# Patient Record
Sex: Female | Born: 1955 | Race: White | Hispanic: No
Health system: Southern US, Academic
[De-identification: ages and names within clinical notes are randomized; demographics above are authoritative.]

## PROBLEM LIST (undated history)

## (undated) ENCOUNTER — Encounter

## (undated) ENCOUNTER — Encounter: Attending: Oncology | Primary: Oncology

## (undated) ENCOUNTER — Ambulatory Visit

## (undated) ENCOUNTER — Encounter: Attending: Hematology & Oncology | Primary: Hematology & Oncology

## (undated) ENCOUNTER — Telehealth: Attending: Hematology & Oncology | Primary: Hematology & Oncology

## (undated) ENCOUNTER — Other Ambulatory Visit

## (undated) ENCOUNTER — Encounter: Attending: Pharmacist | Primary: Pharmacist

## (undated) ENCOUNTER — Telehealth

## (undated) ENCOUNTER — Ambulatory Visit: Payer: MEDICARE

## (undated) ENCOUNTER — Ambulatory Visit: Payer: Medicare (Managed Care)

## (undated) ENCOUNTER — Ambulatory Visit: Payer: MEDICARE | Attending: Oncology | Primary: Oncology

## (undated) ENCOUNTER — Telehealth: Attending: Adult Health | Primary: Adult Health

## (undated) ENCOUNTER — Encounter: Attending: Clinical | Primary: Clinical

## (undated) ENCOUNTER — Ambulatory Visit: Attending: Clinical | Primary: Clinical

## (undated) ENCOUNTER — Telehealth: Attending: Pharmacist | Primary: Pharmacist

## (undated) ENCOUNTER — Ambulatory Visit: Payer: MEDICARE | Attending: Pharmacist | Primary: Pharmacist

## (undated) ENCOUNTER — Ambulatory Visit: Payer: MEDICARE | Attending: Hematology & Oncology | Primary: Hematology & Oncology

## (undated) ENCOUNTER — Ambulatory Visit: Payer: MEDICARE | Attending: Adult Health | Primary: Adult Health

## (undated) ENCOUNTER — Encounter: Attending: Diagnostic Radiology | Primary: Diagnostic Radiology

## (undated) ENCOUNTER — Other Ambulatory Visit: Attending: Clinical | Primary: Clinical

## (undated) ENCOUNTER — Encounter
Attending: Rehabilitative and Restorative Service Providers" | Primary: Rehabilitative and Restorative Service Providers"

## (undated) ENCOUNTER — Ambulatory Visit: Payer: Medicare (Managed Care) | Attending: Hematology & Oncology | Primary: Hematology & Oncology

## (undated) DIAGNOSIS — I341 Nonrheumatic mitral (valve) prolapse: Secondary | ICD-10-CM

## (undated) DIAGNOSIS — Z923 Personal history of irradiation: Secondary | ICD-10-CM

## (undated) DIAGNOSIS — D473 Essential (hemorrhagic) thrombocythemia: Secondary | ICD-10-CM

## (undated) DIAGNOSIS — M3500 Sicca syndrome, unspecified: Secondary | ICD-10-CM

## (undated) DIAGNOSIS — E785 Hyperlipidemia, unspecified: Secondary | ICD-10-CM

## (undated) DIAGNOSIS — K219 Gastro-esophageal reflux disease without esophagitis: Secondary | ICD-10-CM

## (undated) DIAGNOSIS — E559 Vitamin D deficiency, unspecified: Secondary | ICD-10-CM

## (undated) DIAGNOSIS — F329 Major depressive disorder, single episode, unspecified: Secondary | ICD-10-CM

## (undated) DIAGNOSIS — F419 Anxiety disorder, unspecified: Secondary | ICD-10-CM

## (undated) DIAGNOSIS — D649 Anemia, unspecified: Secondary | ICD-10-CM

## (undated) DIAGNOSIS — R011 Cardiac murmur, unspecified: Secondary | ICD-10-CM

## (undated) DIAGNOSIS — M1990 Unspecified osteoarthritis, unspecified site: Secondary | ICD-10-CM

## (undated) DIAGNOSIS — M810 Age-related osteoporosis without current pathological fracture: Secondary | ICD-10-CM

## (undated) DIAGNOSIS — E0590 Thyrotoxicosis, unspecified without thyrotoxic crisis or storm: Secondary | ICD-10-CM

## (undated) DIAGNOSIS — F32A Depression, unspecified: Secondary | ICD-10-CM

## (undated) DIAGNOSIS — R0600 Dyspnea, unspecified: Secondary | ICD-10-CM

## (undated) DIAGNOSIS — C50919 Malignant neoplasm of unspecified site of unspecified female breast: Secondary | ICD-10-CM

## (undated) DIAGNOSIS — I499 Cardiac arrhythmia, unspecified: Secondary | ICD-10-CM

## (undated) DIAGNOSIS — Z9221 Personal history of antineoplastic chemotherapy: Secondary | ICD-10-CM

## (undated) DIAGNOSIS — Z8719 Personal history of other diseases of the digestive system: Secondary | ICD-10-CM

## (undated) HISTORY — DX: Essential (hemorrhagic) thrombocythemia: D47.3

## (undated) HISTORY — PX: TONSILLECTOMY: SUR1361

## (undated) MED ORDER — OMEPRAZOLE 20 MG CAPSULE,DELAYED RELEASE: capsule | 0 refills | 0 days

## (undated) MED ORDER — DOCUSATE SODIUM 100 MG CAPSULE: Freq: Every day | ORAL | 0 days

## (undated) MED ORDER — RUXOLITINIB 10 MG TABLET: Freq: Two times a day (BID) | ORAL | 0.00000 days

## (undated) MED ORDER — OMEGA-3 FATTY ACIDS-FISH OIL 300 MG-1,000 MG CAPSULE: Freq: Every day | ORAL | 0 days

---

## 1993-02-09 DIAGNOSIS — Z923 Personal history of irradiation: Secondary | ICD-10-CM

## 1993-02-09 DIAGNOSIS — Z9221 Personal history of antineoplastic chemotherapy: Secondary | ICD-10-CM

## 1993-02-09 DIAGNOSIS — C50919 Malignant neoplasm of unspecified site of unspecified female breast: Secondary | ICD-10-CM

## 1993-02-09 HISTORY — DX: Personal history of antineoplastic chemotherapy: Z92.21

## 1993-02-09 HISTORY — DX: Personal history of irradiation: Z92.3

## 1993-02-09 HISTORY — PX: BREAST LUMPECTOMY: SHX2

## 1993-02-09 HISTORY — PX: MASTECTOMY: SHX3

## 1993-02-09 HISTORY — PX: AUGMENTATION MAMMAPLASTY: SUR837

## 1993-02-09 HISTORY — DX: Malignant neoplasm of unspecified site of unspecified female breast: C50.919

## 1995-02-10 DIAGNOSIS — I341 Nonrheumatic mitral (valve) prolapse: Secondary | ICD-10-CM

## 1995-02-10 HISTORY — DX: Nonrheumatic mitral (valve) prolapse: I34.1

## 2013-10-23 MED ORDER — LEVOTHYROXINE 25 MCG TABLET
Freq: Every day | ORAL | 0 days
Start: 2013-10-23 — End: ?

## 2014-11-22 DIAGNOSIS — C50512 Malignant neoplasm of lower-outer quadrant of left female breast: Secondary | ICD-10-CM | POA: Insufficient documentation

## 2014-11-22 DIAGNOSIS — R87619 Unspecified abnormal cytological findings in specimens from cervix uteri: Secondary | ICD-10-CM | POA: Insufficient documentation

## 2014-11-22 DIAGNOSIS — M3500 Sicca syndrome, unspecified: Secondary | ICD-10-CM | POA: Insufficient documentation

## 2014-11-22 DIAGNOSIS — K219 Gastro-esophageal reflux disease without esophagitis: Secondary | ICD-10-CM | POA: Insufficient documentation

## 2014-11-22 DIAGNOSIS — E782 Mixed hyperlipidemia: Secondary | ICD-10-CM | POA: Insufficient documentation

## 2014-11-22 DIAGNOSIS — E559 Vitamin D deficiency, unspecified: Secondary | ICD-10-CM | POA: Insufficient documentation

## 2014-11-26 ENCOUNTER — Other Ambulatory Visit: Payer: Self-pay | Admitting: Family Medicine

## 2014-11-26 DIAGNOSIS — E559 Vitamin D deficiency, unspecified: Secondary | ICD-10-CM

## 2014-12-20 ENCOUNTER — Other Ambulatory Visit: Payer: Self-pay | Admitting: Family Medicine

## 2014-12-20 ENCOUNTER — Inpatient Hospital Stay
Admission: RE | Admit: 2014-12-20 | Discharge: 2014-12-20 | Disposition: A | Discharge status: Home / Self Care | Payer: Self-pay | Source: Ambulatory Visit | Attending: *Deleted | Admitting: *Deleted

## 2014-12-20 ENCOUNTER — Other Ambulatory Visit: Payer: Self-pay | Admitting: *Deleted

## 2014-12-20 DIAGNOSIS — Z9289 Personal history of other medical treatment: Secondary | ICD-10-CM

## 2014-12-20 DIAGNOSIS — Z1231 Encounter for screening mammogram for malignant neoplasm of breast: Secondary | ICD-10-CM

## 2014-12-25 ENCOUNTER — Ambulatory Visit
Admission: RE | Admit: 2014-12-25 | Discharge: 2014-12-25 | Disposition: A | Discharge status: Home / Self Care | Payer: No Typology Code available for payment source | Source: Ambulatory Visit | Attending: Family Medicine | Admitting: Family Medicine

## 2014-12-25 ENCOUNTER — Other Ambulatory Visit: Payer: Self-pay | Admitting: Family Medicine

## 2014-12-25 DIAGNOSIS — Z1231 Encounter for screening mammogram for malignant neoplasm of breast: Secondary | ICD-10-CM

## 2014-12-25 DIAGNOSIS — Z9882 Breast implant status: Secondary | ICD-10-CM | POA: Diagnosis not present

## 2014-12-25 HISTORY — DX: Malignant neoplasm of unspecified site of unspecified female breast: C50.919

## 2015-01-08 ENCOUNTER — Ambulatory Visit
Admission: RE | Admit: 2015-01-08 | Discharge: 2015-01-08 | Disposition: A | Discharge status: Home / Self Care | Payer: No Typology Code available for payment source | Source: Ambulatory Visit | Attending: Family Medicine | Admitting: Family Medicine

## 2015-01-08 DIAGNOSIS — M810 Age-related osteoporosis without current pathological fracture: Secondary | ICD-10-CM | POA: Insufficient documentation

## 2015-01-08 DIAGNOSIS — E559 Vitamin D deficiency, unspecified: Secondary | ICD-10-CM | POA: Diagnosis not present

## 2015-01-11 ENCOUNTER — Other Ambulatory Visit: Payer: Self-pay | Admitting: Family Medicine

## 2015-01-11 DIAGNOSIS — R7989 Other specified abnormal findings of blood chemistry: Secondary | ICD-10-CM

## 2015-01-21 ENCOUNTER — Ambulatory Visit: Payer: No Typology Code available for payment source

## 2015-01-22 ENCOUNTER — Ambulatory Visit
Admission: RE | Admit: 2015-01-22 | Discharge: 2015-01-22 | Disposition: A | Discharge status: Home / Self Care | Payer: No Typology Code available for payment source | Source: Ambulatory Visit | Attending: Family Medicine | Admitting: Family Medicine

## 2015-01-22 ENCOUNTER — Ambulatory Visit: Payer: No Typology Code available for payment source

## 2015-01-22 DIAGNOSIS — R7989 Other specified abnormal findings of blood chemistry: Secondary | ICD-10-CM | POA: Diagnosis present

## 2015-06-10 DIAGNOSIS — M810 Age-related osteoporosis without current pathological fracture: Secondary | ICD-10-CM | POA: Insufficient documentation

## 2015-11-25 DIAGNOSIS — E7800 Pure hypercholesterolemia, unspecified: Secondary | ICD-10-CM | POA: Insufficient documentation

## 2015-11-27 ENCOUNTER — Other Ambulatory Visit: Payer: Self-pay | Admitting: Internal Medicine

## 2015-11-27 DIAGNOSIS — Z1231 Encounter for screening mammogram for malignant neoplasm of breast: Secondary | ICD-10-CM

## 2015-12-27 ENCOUNTER — Ambulatory Visit: Payer: No Typology Code available for payment source

## 2016-01-08 ENCOUNTER — Ambulatory Visit
Admission: RE | Admit: 2016-01-08 | Discharge: 2016-01-08 | Disposition: A | Discharge status: Home / Self Care | Payer: BLUE CROSS/BLUE SHIELD | Source: Ambulatory Visit | Attending: Internal Medicine | Admitting: Internal Medicine

## 2016-01-08 ENCOUNTER — Other Ambulatory Visit: Payer: Self-pay | Admitting: Internal Medicine

## 2016-01-08 DIAGNOSIS — Z1231 Encounter for screening mammogram for malignant neoplasm of breast: Secondary | ICD-10-CM | POA: Diagnosis not present

## 2016-01-08 HISTORY — DX: Personal history of antineoplastic chemotherapy: Z92.21

## 2016-01-08 HISTORY — DX: Personal history of irradiation: Z92.3

## 2016-01-17 DIAGNOSIS — D473 Essential (hemorrhagic) thrombocythemia: Secondary | ICD-10-CM | POA: Insufficient documentation

## 2016-04-03 ENCOUNTER — Encounter: Payer: Self-pay | Admitting: *Deleted

## 2016-04-05 DIAGNOSIS — E063 Autoimmune thyroiditis: Secondary | ICD-10-CM | POA: Insufficient documentation

## 2016-04-06 ENCOUNTER — Encounter: Admission: RE | Payer: Self-pay | Source: Ambulatory Visit

## 2016-04-06 ENCOUNTER — Ambulatory Visit
Admission: RE | Admit: 2016-04-06 | Payer: BLUE CROSS/BLUE SHIELD | Source: Ambulatory Visit | Admitting: Unknown Physician Specialty

## 2016-04-06 HISTORY — DX: Major depressive disorder, single episode, unspecified: F32.9

## 2016-04-06 HISTORY — DX: Unspecified osteoarthritis, unspecified site: M19.90

## 2016-04-06 HISTORY — DX: Depression, unspecified: F32.A

## 2016-04-06 HISTORY — DX: Hyperlipidemia, unspecified: E78.5

## 2016-04-06 HISTORY — DX: Gastro-esophageal reflux disease without esophagitis: K21.9

## 2016-04-06 HISTORY — DX: Vitamin D deficiency, unspecified: E55.9

## 2016-04-06 HISTORY — DX: Anemia, unspecified: D64.9

## 2016-04-06 HISTORY — DX: Sjogren syndrome, unspecified: M35.00

## 2016-04-06 SURGERY — COLONOSCOPY WITH PROPOFOL
Anesthesia: General

## 2016-04-17 ENCOUNTER — Inpatient Hospital Stay: Payer: BLUE CROSS/BLUE SHIELD

## 2016-04-17 ENCOUNTER — Inpatient Hospital Stay: Payer: BLUE CROSS/BLUE SHIELD | Attending: Oncology | Admitting: Oncology

## 2016-04-17 ENCOUNTER — Encounter: Payer: Self-pay | Admitting: Oncology

## 2016-04-17 VITALS — BP 123/66 | HR 63 | Temp 96.8°F | Resp 18 | Ht 64.0 in | Wt 123.7 lb

## 2016-04-17 DIAGNOSIS — M818 Other osteoporosis without current pathological fracture: Secondary | ICD-10-CM | POA: Insufficient documentation

## 2016-04-17 DIAGNOSIS — Z853 Personal history of malignant neoplasm of breast: Secondary | ICD-10-CM | POA: Diagnosis not present

## 2016-04-17 DIAGNOSIS — Z79899 Other long term (current) drug therapy: Secondary | ICD-10-CM | POA: Diagnosis not present

## 2016-04-17 DIAGNOSIS — E785 Hyperlipidemia, unspecified: Secondary | ICD-10-CM | POA: Insufficient documentation

## 2016-04-17 DIAGNOSIS — D473 Essential (hemorrhagic) thrombocythemia: Secondary | ICD-10-CM | POA: Diagnosis present

## 2016-04-17 DIAGNOSIS — E063 Autoimmune thyroiditis: Secondary | ICD-10-CM | POA: Diagnosis not present

## 2016-04-17 DIAGNOSIS — E559 Vitamin D deficiency, unspecified: Secondary | ICD-10-CM

## 2016-04-17 DIAGNOSIS — I341 Nonrheumatic mitral (valve) prolapse: Secondary | ICD-10-CM | POA: Diagnosis not present

## 2016-04-17 DIAGNOSIS — Z9481 Bone marrow transplant status: Secondary | ICD-10-CM

## 2016-04-17 DIAGNOSIS — Z9012 Acquired absence of left breast and nipple: Secondary | ICD-10-CM | POA: Insufficient documentation

## 2016-04-17 DIAGNOSIS — D75839 Thrombocytosis, unspecified: Secondary | ICD-10-CM

## 2016-04-17 DIAGNOSIS — M3500 Sicca syndrome, unspecified: Secondary | ICD-10-CM | POA: Insufficient documentation

## 2016-04-17 DIAGNOSIS — D696 Thrombocytopenia, unspecified: Secondary | ICD-10-CM | POA: Insufficient documentation

## 2016-04-17 DIAGNOSIS — F329 Major depressive disorder, single episode, unspecified: Secondary | ICD-10-CM | POA: Diagnosis not present

## 2016-04-17 DIAGNOSIS — Z87891 Personal history of nicotine dependence: Secondary | ICD-10-CM | POA: Insufficient documentation

## 2016-04-17 DIAGNOSIS — K219 Gastro-esophageal reflux disease without esophagitis: Secondary | ICD-10-CM

## 2016-04-17 LAB — DIFFERENTIAL
Basophils Absolute: 0.1 10*3/uL (ref 0–0.1)
Basophils Relative: 1 %
EOS PCT: 2 %
Eosinophils Absolute: 0.2 10*3/uL (ref 0–0.7)
LYMPHS ABS: 2.2 10*3/uL (ref 1.0–3.6)
LYMPHS PCT: 26 %
MONO ABS: 0.6 10*3/uL (ref 0.2–0.9)
Monocytes Relative: 7 %
NEUTROS ABS: 5.4 10*3/uL (ref 1.4–6.5)
Neutrophils Relative %: 64 %

## 2016-04-17 LAB — CBC
HEMATOCRIT: 37.9 % (ref 35.0–47.0)
HEMOGLOBIN: 12.9 g/dL (ref 12.0–16.0)
MCH: 29.5 pg (ref 26.0–34.0)
MCHC: 33.9 g/dL (ref 32.0–36.0)
MCV: 86.9 fL (ref 80.0–100.0)
Platelets: 718 10*3/uL — ABNORMAL HIGH (ref 150–440)
RBC: 4.36 MIL/uL (ref 3.80–5.20)
RDW: 14.1 % (ref 11.5–14.5)
WBC: 8.6 10*3/uL (ref 3.6–11.0)

## 2016-04-17 LAB — IRON AND TIBC
Iron: 68 ug/dL (ref 28–170)
SATURATION RATIOS: 17 % (ref 10.4–31.8)
TIBC: 399 ug/dL (ref 250–450)
UIBC: 331 ug/dL

## 2016-04-17 LAB — C-REACTIVE PROTEIN: CRP: <0.8 mg/dL (ref <1.0)

## 2016-04-17 LAB — SEDIMENTATION RATE: Sed Rate: 9 mm/hr (ref 0–30)

## 2016-04-17 LAB — FERRITIN: Ferritin: 57 ng/mL (ref 11–307)

## 2016-04-17 NOTE — Progress Notes (Signed)
Hematology/Oncology Consult note [facility] Telephone:([phone] Fax:([phone]  Patient Care Team: Clarisse Gouge, MD as PCP - General (Family Medicine)   Name of the patient: Karen [name]  [id number]  1955-04-29    Reason for referral- thrombocytosis   Referring physician- Dr. Vickki Muff  Date of visit: 04/17/16   History of presenting illness- patient is a 61 year old female with a history of Sjogren syndrome and autoimmune thyroid disorder. She also had left breast cancer at 61 years of age and underwent mastectomy as well as chemotherapy and autologus bone marrow transplant in 1994. She has a history of mitral valve prolapse. She has been referred to [place] for evaluation of thrombocytosis. She has a h/o hyperthyroidism for which she was on medication in the past. Recently she has been started on propranolol and is awaiting endocrinology referral.  Most recent CBC from 03/30/2016 showed white count of 7.5, H&H of 12.4/36.8 and a platelet count of 727. MCV was normal at 87.2. CMP was within normal limits except for potassium of 5.9. TSH was less than 0.01. Platelet count in December 2017 was 664 and in October 2017 it was 621. In November 2016 her platelet count was 654 and 738.   Patient currently reports generalized itching. Denies other complaints. No prior h/o thrombosis. Her daughters have been found to be BRCA positive but patient was tested negative.  ECOG PS- 0  Pain scale- 0   Review of systems- Review of Systems  Constitutional: Negative for chills, fever, malaise/fatigue and weight loss.  HENT: Negative for congestion, ear discharge and nosebleeds.   Eyes: Negative for blurred vision.  Respiratory: Negative for cough, hemoptysis, sputum production, shortness of breath and wheezing.   Cardiovascular: Negative for chest pain, palpitations, orthopnea and claudication.  Gastrointestinal: Negative for abdominal pain, blood in stool,  constipation, diarrhea, heartburn, melena, nausea and vomiting.  Genitourinary: Negative for dysuria, flank pain, frequency, hematuria and urgency.  Musculoskeletal: Negative for back pain, joint pain and myalgias.  Skin: Positive for itching. Negative for rash.  Neurological: Negative for dizziness, tingling, focal weakness, seizures, weakness and headaches.  Endo/Heme/Allergies: Does not bruise/bleed easily.  Psychiatric/Behavioral: Negative for depression and suicidal ideas. The patient does not have insomnia.     No Known Allergies  Patient Active Problem List   Diagnosis Date Noted  . Autoimmune thyroiditis 04/05/2016  . Thrombocytosis ([place]) 01/17/2016  . Pure hypercholesterolemia 11/25/2015  . Age-related osteoporosis without current pathological fracture 06/10/2015  . Abnormal Pap smear of cervix 11/22/2014  . Gastroesophageal reflux disease 11/22/2014  . Sjogrens syndrome ([place]) 11/22/2014  . Malignant neoplasm of lower-outer quadrant of left female breast ([place]) 11/22/2014  . Mixed hyperlipidemia 11/22/2014  . Vitamin D deficiency 11/22/2014     Past Medical History:  Diagnosis Date  . Anemia   . Arthritis   . Breast cancer ([place]) 1995   left breast cancer - chemo and modified mastectomy  . Depression   . GERD (gastroesophageal reflux disease)   . Hyperlipidemia   . Personal history of chemotherapy 1995   BREAST CA  . Personal history of radiation therapy 1995   BREAST CA  . Sjogren's syndrome ([place])   . Vitamin D deficiency      Past Surgical History:  Procedure Laterality Date  . AUGMENTATION MAMMAPLASTY Bilateral   . BREAST LUMPECTOMY Left 1995  . MASTECTOMY Left 1995   Tram flap W/IMPLANT    Social History   Social History  . Marital status: Divorced  Spouse name: N/A  . Number of children: N/A  . Years of education: N/A   Occupational History  . Not on file.   Social History Main Topics  . Smoking status: Former Smoker    Packs/day: 0.50     Years: 48.00    Quit date: 02/09/2010  . Smokeless tobacco: Never Used  . Alcohol use Yes     Comment: occ. wine maybe 1  month  . Drug use: No  . Sexual activity: Not on file   Other Topics Concern  . Not on file   Social History Narrative  . No narrative on file     Family History  Problem Relation Age of Onset  . Breast cancer Neg Hx      Current Outpatient Prescriptions:  Marland Kitchen  Multiple Vitamin (MULTIVITAMIN) tablet, Take 1 tablet by mouth daily., Disp: , Rfl:  .  omega-3 acid ethyl esters (LOVAZA) 1 g capsule, Take 1 g by mouth daily., Disp: , Rfl:  .  PARoxetine (PAXIL) 20 MG tablet, Take 20 mg by mouth daily., Disp: , Rfl:  .  pravastatin (PRAVACHOL) 20 MG tablet, Take 20 mg by mouth daily., Disp: , Rfl:  .  Probiotic Product (PROBIOTIC-10) CAPS, Take 1 capsule by mouth daily., Disp: , Rfl:  .  propranolol (INDERAL) 10 MG tablet, Take 10 mg by mouth 3 (three) times daily., Disp: , Rfl:  .  raloxifene (EVISTA) 60 MG tablet, Take 60 mg by mouth daily., Disp: , Rfl:  .  psyllium (METAMUCIL) 58.6 % packet, Take 1 packet by mouth daily as needed. , Disp: , Rfl:    Physical exam:  Vitals:   04/17/16 0938  BP: 123/66  Pulse: 63  Resp: 18  Temp: (!) 96.8 F (36 C)  TempSrc: Tympanic  Weight: 123 lb 10.9 oz (56.1 kg)  Height: 5' 4"  (1.626 m)   Physical Exam  Constitutional: She is oriented to person, place, and time and well-developed, well-nourished, and in no distress.  HENT:  Head: Normocephalic and atraumatic.  Eyes: EOM are normal. Pupils are equal, round, and reactive to light.  Neck: Normal range of motion.  Cardiovascular: Normal rate, regular rhythm and normal heart sounds.   Pulmonary/Chest: Effort normal and breath sounds normal.  Abdominal: Soft. Bowel sounds are normal.  Neurological: She is alert and oriented to person, place, and time.  Skin: Skin is warm and dry.        Assessment and plan- Patient is a 61 y.o. female who has been referred to [place]  for evaluation and management of thrombocytosis  Patient has a long-standing history of thrombocytosis with platelet counts that have remained high between 600-700's but overall stable in that range. I explained to the patient that thrombocytosis can be primary due to an underlying bone marrow disorder versus secondary from acute inflammation or stress as well as conditions like iron deficiency.   Today I will check a CBC with manual differential, and a  pathology review of her smear. Will check ESR, CRP, iron studies and jak 2 mutation with reflex to CALR and MPL as well as BCR abl testing. I will see the patient back in 2 weeks' time to discuss the results of her blood work and further management at that time. If the cause of thrombocytosis remains unclear, I will consider doing a bone marrow biopsy to rule out myeloproliferative disorder.   Thank you for this kind referral and the opportunity to participate in the care of this patient  Visit Diagnosis 1. Thrombocytosis ([place])     Dr. Randa Evens, MD, MPH [facility] at [facility] Pager- [phone] 04/17/2016  10:20 AM

## 2016-04-20 LAB — BCR-ABL1, CML/ALL, PCR, QUANT

## 2016-04-22 LAB — JAK2 V617F, W REFLEX TO CALR/E12/MPL

## 2016-04-23 ENCOUNTER — Other Ambulatory Visit: Payer: Self-pay | Admitting: *Deleted

## 2016-04-23 DIAGNOSIS — D473 Essential (hemorrhagic) thrombocythemia: Secondary | ICD-10-CM

## 2016-04-23 DIAGNOSIS — Z1589 Genetic susceptibility to other disease: Secondary | ICD-10-CM

## 2016-04-23 DIAGNOSIS — D75839 Thrombocytosis, unspecified: Secondary | ICD-10-CM

## 2016-04-24 LAB — JAK2 EXONS 12-15

## 2016-04-27 LAB — CALRETICULIN (CALR) MUTATION ANALYSIS

## 2016-04-27 LAB — MPL MUTATION ANALYSIS

## 2016-04-29 ENCOUNTER — Telehealth: Payer: Self-pay | Admitting: *Deleted

## 2016-04-29 NOTE — Telephone Encounter (Signed)
Asking to reschedule BM Bx from Wednesday to the following Monday. Please call her back

## 2016-04-30 ENCOUNTER — Telehealth: Payer: Self-pay | Admitting: *Deleted

## 2016-04-30 NOTE — Telephone Encounter (Signed)
I originally called pt to tell her 3/28 and she needs to have it on a Monday and I had called specialty sch. And asked for it to be changed and they had a pt. They had a patient that they were trying to move to another day and if so she could have that spot. Later this morning I was called back by Pamala Hurry in sch. And she states they were unable to put her on for 3/26 so it will have to be 4/2 for her to arrive at 8 am and have procedure at 9 am.  I have called pt and left her a message. Told her all the above on the message and then if she needs to call me back she can and I left my #/  I have left her all instructions for the procedure.

## 2016-05-05 NOTE — Telephone Encounter (Signed)
There is a lot of notes about her. I have made the appt for Monday and it will be 4/2. Pt already aware

## 2016-05-06 ENCOUNTER — Ambulatory Visit: Payer: BLUE CROSS/BLUE SHIELD

## 2016-05-08 ENCOUNTER — Inpatient Hospital Stay: Payer: BLUE CROSS/BLUE SHIELD | Admitting: Oncology

## 2016-05-08 ENCOUNTER — Other Ambulatory Visit: Payer: Self-pay | Admitting: Radiology

## 2016-05-11 ENCOUNTER — Ambulatory Visit
Admission: RE | Admit: 2016-05-11 | Discharge: 2016-05-11 | Disposition: A | Discharge status: Home / Self Care | Payer: BLUE CROSS/BLUE SHIELD | Source: Ambulatory Visit | Attending: Oncology | Admitting: Oncology

## 2016-05-11 ENCOUNTER — Other Ambulatory Visit (HOSPITAL_COMMUNITY)
Admission: RE | Admit: 2016-05-11 | Disposition: A | Discharge status: Home / Self Care | Payer: BLUE CROSS/BLUE SHIELD | Source: Other Acute Inpatient Hospital | Attending: Oncology | Admitting: Oncology

## 2016-05-11 DIAGNOSIS — D473 Essential (hemorrhagic) thrombocythemia: Secondary | ICD-10-CM | POA: Insufficient documentation

## 2016-05-11 DIAGNOSIS — R7989 Other specified abnormal findings of blood chemistry: Secondary | ICD-10-CM | POA: Insufficient documentation

## 2016-05-11 DIAGNOSIS — D75839 Thrombocytosis, unspecified: Secondary | ICD-10-CM

## 2016-05-11 DIAGNOSIS — Z1589 Genetic susceptibility to other disease: Secondary | ICD-10-CM | POA: Diagnosis present

## 2016-05-11 HISTORY — DX: Personal history of other diseases of the digestive system: Z87.19

## 2016-05-11 HISTORY — DX: Nonrheumatic mitral (valve) prolapse: I34.1

## 2016-05-11 HISTORY — DX: Thyrotoxicosis, unspecified without thyrotoxic crisis or storm: E05.90

## 2016-05-11 HISTORY — DX: Dyspnea, unspecified: R06.00

## 2016-05-11 LAB — CBC WITH DIFFERENTIAL/PLATELET
Basophils Absolute: 0.1 10*3/uL (ref 0–0.1)
Basophils Relative: 1 %
EOS ABS: 0.1 10*3/uL (ref 0–0.7)
Eosinophils Relative: 2 %
HEMATOCRIT: 36.5 % (ref 35.0–47.0)
HEMOGLOBIN: 12.5 g/dL (ref 12.0–16.0)
LYMPHS ABS: 1.8 10*3/uL (ref 1.0–3.6)
LYMPHS PCT: 23 %
MCH: 29.1 pg (ref 26.0–34.0)
MCHC: 34.3 g/dL (ref 32.0–36.0)
MCV: 84.8 fL (ref 80.0–100.0)
MONOS PCT: 9 %
Monocytes Absolute: 0.7 10*3/uL (ref 0.2–0.9)
NEUTROS PCT: 65 %
Neutro Abs: 5.1 10*3/uL (ref 1.4–6.5)
Platelets: 618 10*3/uL — ABNORMAL HIGH (ref 150–440)
RBC: 4.3 MIL/uL (ref 3.80–5.20)
RDW: 14 % (ref 11.5–14.5)
WBC: 7.9 10*3/uL (ref 3.6–11.0)

## 2016-05-11 LAB — APTT: APTT: 31 s (ref 24–36)

## 2016-05-11 LAB — PROTIME-INR
INR: 1.08
Prothrombin Time: 14 seconds (ref 11.4–15.2)

## 2016-05-11 MED ORDER — HEPARIN SOD (PORK) LOCK FLUSH 100 UNIT/ML IV SOLN
INTRAVENOUS | Status: AC
  Filled 2016-05-11: qty 5

## 2016-05-11 MED ORDER — MIDAZOLAM HCL 5 MG/5ML IJ SOLN
INTRAMUSCULAR | Status: AC | PRN
  Administered 2016-05-11 (×2): 0.5 mg via INTRAVENOUS
  Administered 2016-05-11: 1 mg via INTRAVENOUS

## 2016-05-11 MED ORDER — FENTANYL CITRATE (PF) 100 MCG/2ML IJ SOLN
INTRAMUSCULAR | Status: AC
  Filled 2016-05-11: qty 2

## 2016-05-11 MED ORDER — FENTANYL CITRATE (PF) 100 MCG/2ML IJ SOLN
INTRAMUSCULAR | Status: AC | PRN
  Administered 2016-05-11: 25 ug via INTRAVENOUS
  Administered 2016-05-11: 50 ug via INTRAVENOUS

## 2016-05-11 MED ORDER — MIDAZOLAM HCL 5 MG/5ML IJ SOLN
INTRAMUSCULAR | Status: AC
  Filled 2016-05-11: qty 5

## 2016-05-11 MED ORDER — SODIUM CHLORIDE 0.9 % IV SOLN
INTRAVENOUS | Status: DC
  Administered 2016-05-11: 09:00:00 via INTRAVENOUS

## 2016-05-11 NOTE — H&P (Signed)
Chief Complaint: Patient was seen in consultation today for bone marrow biopsy at the request of Rao,Archana C  Referring Physician(s): Rao,Archana C  Patient Status: [facility] - Out-pt  History of Present Illness: Karen [name] is a 61 y.o. female with thrombocytosis and history of breast carcinoma with autologous BM transplant.  Recent diagnosis of hyperthyroidism.  Anxious about biopsy, but otherwise asymptomatic today.  Past Medical History:  Diagnosis Date  . Anemia   . Arthritis   . Breast cancer ([place]) 1995   left breast cancer - chemo and modified mastectomy  . Depression   . Dyspnea   . GERD (gastroesophageal reflux disease)   . History of hiatal hernia   . Hyperlipidemia   . Hyperthyroidism   . Mitral valve prolapse 1997  . Personal history of chemotherapy 1995   BREAST CA  . Personal history of radiation therapy 1995   BREAST CA  . Sjogren's syndrome ([place])   . Vitamin D deficiency     Past Surgical History:  Procedure Laterality Date  . AUGMENTATION MAMMAPLASTY Bilateral   . BREAST LUMPECTOMY Left 1995  . MASTECTOMY Left 1995   Tram flap W/IMPLANT    Allergies: Patient has no known allergies.  Medications: Prior to Admission medications   Medication Sig Start Date End Date Taking? Authorizing Provider  omega-3 acid ethyl esters (LOVAZA) 1 g capsule Take 1 g by mouth daily.   Yes Historical Provider, MD  PARoxetine (PAXIL) 20 MG tablet Take 20 mg by mouth daily.   Yes Historical Provider, MD  pravastatin (PRAVACHOL) 20 MG tablet Take 20 mg by mouth daily.   Yes Historical Provider, MD  Probiotic Product (PROBIOTIC-10) CAPS Take 1 capsule by mouth daily.   Yes Historical Provider, MD  propranolol (INDERAL) 10 MG tablet Take 10 mg by mouth 3 (three) times daily.   Yes Historical Provider, MD  raloxifene (EVISTA) 60 MG tablet Take 60 mg by mouth daily.   Yes Historical Provider, MD  Multiple Vitamin (MULTIVITAMIN) tablet Take 1 tablet by mouth daily.     Historical Provider, MD  psyllium (METAMUCIL) 58.6 % packet Take 1 packet by mouth daily as needed.     Historical Provider, MD     Family History  Problem Relation Age of Onset  . Breast cancer Neg Hx     Social History   Social History  . Marital status: Divorced    Spouse name: N/A  . Number of children: N/A  . Years of education: N/A   Social History Main Topics  . Smoking status: Former Smoker    Packs/day: 0.50    Years: 48.00    Types: Cigarettes    Quit date: 02/09/2010  . Smokeless tobacco: Never Used  . Alcohol use Yes     Comment: occ. wine maybe 1  month  . Drug use: No  . Sexual activity: Not Asked   Other Topics Concern  . None   Social History Narrative  . None    Review of Systems: A 12 point ROS discussed and pertinent positives are indicated in the HPI above.  All other systems are negative.  Review of Systems  Constitutional: Negative.   HENT: Negative.   Respiratory: Negative.   Cardiovascular: Negative.   Gastrointestinal: Negative.   Genitourinary: Negative.   Musculoskeletal: Negative.   Neurological: Negative.   Hematological: Negative.     Vital Signs: BP 126/62   Pulse 72   Temp 97.7 F (36.5 C) (Oral)   Resp  19   Ht 5' 4"  (1.626 m)   Wt 125 lb (56.7 kg)   SpO2 99%   BMI 21.46 kg/m   Physical Exam  Constitutional: She is oriented to person, place, and time. She appears well-developed. No distress.  HENT:  Head: Normocephalic and atraumatic.  Neck: Neck supple. No JVD present. No tracheal deviation present. No thyromegaly present.  Cardiovascular: Normal rate, regular rhythm and normal heart sounds.  Exam reveals no gallop and no friction rub.   No murmur heard. Pulmonary/Chest: Effort normal and breath sounds normal. No stridor. No respiratory distress. She has no wheezes. She has no rales.  Abdominal: Soft. Bowel sounds are normal. She exhibits no distension and no mass. There is no tenderness. There is no rebound and no  guarding.  Musculoskeletal: She exhibits no edema.  Lymphadenopathy:    She has no cervical adenopathy.  Neurological: She is alert and oriented to person, place, and time.  Skin: She is not diaphoretic.    Mallampati Score:  MD Evaluation Airway: WNL Heart: WNL Abdomen: WNL Chest/ Lungs: WNL ASA  Classification: 2 Mallampati/Airway Score: One  Imaging: No results found.  Labs:  CBC:  Recent Labs  04/17/16 1024 05/11/16 0808  WBC 8.6 7.9  HGB 12.9 12.5  HCT 37.9 36.5  PLT 718* 618*    COAGS: No results for input(s): INR, APTT in the last 8760 hours.  BMP: No results for input(s): NA, K, CL, CO2, GLUCOSE, BUN, CALCIUM, CREATININE, GFRNONAA, GFRAA in the last 8760 hours.  Invalid input(s): CMP   Assessment and Plan:  For CT guided bone marrow aspirate and core biopsy today.  Risks and benefits discussed with the patient including, but not limited to bleeding, infection, damage to adjacent structures or low yield requiring additional tests. All of the patient's questions were answered, patient is agreeable to proceed. Consent signed and in chart.  Thank you for this interesting consult.  I greatly enjoyed meeting Lakeisa Heninger and look forward to participating in their care.  A copy of this report was sent to the requesting provider on this date.  Electronically SignedAletta Edouard T 05/11/2016, 8:41 AM     I spent a total of 15 Minutes in face to face in clinical consultation, greater than 50% of which was counseling/coordinating care for bone marrow biopsy.

## 2016-05-11 NOTE — Procedures (Signed)
Interventional Radiology Procedure Note  Procedure: CT guided aspirate and core biopsy of right iliac bone Complications: None Recommendations: - Bedrest supine x 1 hrs - Follow biopsy results  Jaedynn Bohlken T. Nasia Cannan, M.D Pager:  [phone]   

## 2016-05-11 NOTE — Progress Notes (Signed)
Patient clinically stable post procedure, ready for discharge. Instructions given earlier per [place] RN with questions answered. Denies complaints. Dressing to lower back c/d/I.

## 2016-05-20 LAB — CHROMOSOME ANALYSIS, BONE MARROW

## 2016-05-21 ENCOUNTER — Encounter: Payer: Self-pay | Admitting: Oncology

## 2016-05-21 ENCOUNTER — Inpatient Hospital Stay: Payer: BLUE CROSS/BLUE SHIELD | Attending: Oncology | Admitting: Oncology

## 2016-05-21 VITALS — BP 118/62 | HR 71 | Temp 96.4°F | Resp 18 | Wt 125.1 lb

## 2016-05-21 DIAGNOSIS — Z79899 Other long term (current) drug therapy: Secondary | ICD-10-CM | POA: Insufficient documentation

## 2016-05-21 DIAGNOSIS — E039 Hypothyroidism, unspecified: Secondary | ICD-10-CM

## 2016-05-21 DIAGNOSIS — M3500 Sicca syndrome, unspecified: Secondary | ICD-10-CM | POA: Insufficient documentation

## 2016-05-21 DIAGNOSIS — Z9221 Personal history of antineoplastic chemotherapy: Secondary | ICD-10-CM | POA: Insufficient documentation

## 2016-05-21 DIAGNOSIS — Z853 Personal history of malignant neoplasm of breast: Secondary | ICD-10-CM | POA: Diagnosis not present

## 2016-05-21 DIAGNOSIS — F329 Major depressive disorder, single episode, unspecified: Secondary | ICD-10-CM | POA: Insufficient documentation

## 2016-05-21 DIAGNOSIS — K219 Gastro-esophageal reflux disease without esophagitis: Secondary | ICD-10-CM | POA: Diagnosis not present

## 2016-05-21 DIAGNOSIS — K449 Diaphragmatic hernia without obstruction or gangrene: Secondary | ICD-10-CM

## 2016-05-21 DIAGNOSIS — I341 Nonrheumatic mitral (valve) prolapse: Secondary | ICD-10-CM | POA: Diagnosis not present

## 2016-05-21 DIAGNOSIS — R0602 Shortness of breath: Secondary | ICD-10-CM | POA: Diagnosis not present

## 2016-05-21 DIAGNOSIS — Z87891 Personal history of nicotine dependence: Secondary | ICD-10-CM | POA: Diagnosis not present

## 2016-05-21 DIAGNOSIS — E785 Hyperlipidemia, unspecified: Secondary | ICD-10-CM | POA: Insufficient documentation

## 2016-05-21 DIAGNOSIS — D473 Essential (hemorrhagic) thrombocythemia: Secondary | ICD-10-CM | POA: Diagnosis present

## 2016-05-21 DIAGNOSIS — E559 Vitamin D deficiency, unspecified: Secondary | ICD-10-CM | POA: Insufficient documentation

## 2016-05-21 MED ORDER — HYDROXYUREA 500 MG PO CAPS: 500.0000 mg | ORAL_CAPSULE | Freq: Every day | ORAL | 1 refills | Status: DC

## 2016-05-21 NOTE — Progress Notes (Signed)
Hematology/Oncology Consult note [facility]  [phone] Fax:([phone]  Patient Care Team: Glendon Axe, MD as PCP - General (Internal Medicine)   Name of the patient: Karen [name]  [id number]  1955-09-14   Date of visit: 05/21/16  Diagnosis- essential thrombocythemia JAK2 positive  Chief complaint/ Reason for visit- discuss results of  bloodwork and BM biopsy  Heme/Onc history: patient is a 61 year old female with a history of Sjogren syndrome and autoimmune thyroid disorder. She also had left breast cancer at 61 years of age and underwent mastectomy as well as chemotherapy and autologus bone marrow transplant in 1994. She has a history of mitral valve prolapse. She has been referred to [place] for evaluation of thrombocytosis. She has a h/o hyperthyroidism for which she was on medication in the past. Recently she has been started on propranolol and is awaiting endocrinology referral.  Most recent CBC from 03/30/2016 showed white count of 7.5, H&H of 12.4/36.8 and a platelet count of 727. MCV was normal at 87.2. CMP was within normal limits except for potassium of 5.9. TSH was less than 0.01. Platelet count in December 2017 was 664 and in October 2017 it was 621. In November 2016 her platelet count was 654 and 738.   Results of bloodwork from 04/17/2016 were as follows: CBC showed white count of 8.6, H&H of 12.9/37.9 and a platelet count of 718. BCR able testing was negative. Jak 2 mutation testing was positive. CALR and MPL mutations were negative. Iron studies were within normal limits. ESR and CRP was normal.  Bone marrow biopsy from 05/12/2016 showed very little cellularity in the bone marrow. Increase in atypical megakaryocytes. No evidence of bone marrow fibrosis. Findings consistent with myeloproliferative neoplasm particularly essential thrombocythemia  Interval history- reports doing well. She will be seeing endocrinology soon for her  thyroid issues.   ECOG PS- 0 Pain scale- 0 Opioid associated constipation- n/a  Review of systems- Review of Systems  Constitutional: Negative for chills, fever, malaise/fatigue and weight loss.  HENT: Negative for congestion, ear discharge and nosebleeds.   Eyes: Negative for blurred vision.  Respiratory: Negative for cough, hemoptysis, sputum production, shortness of breath and wheezing.   Cardiovascular: Negative for chest pain, palpitations, orthopnea and claudication.  Gastrointestinal: Negative for abdominal pain, blood in stool, constipation, diarrhea, heartburn, melena, nausea and vomiting.  Genitourinary: Negative for dysuria, flank pain, frequency, hematuria and urgency.  Musculoskeletal: Negative for back pain, joint pain and myalgias.  Skin: Negative for rash.  Neurological: Negative for dizziness, tingling, focal weakness, seizures, weakness and headaches.  Endo/Heme/Allergies: Does not bruise/bleed easily.  Psychiatric/Behavioral: Negative for depression and suicidal ideas. The patient does not have insomnia.      Current treatment- yet to start  No Known Allergies   Past Medical History:  Diagnosis Date  . Anemia   . Arthritis   . Breast cancer ([place]) 1995   left breast cancer - chemo and modified mastectomy  . Depression   . Dyspnea   . GERD (gastroesophageal reflux disease)   . History of hiatal hernia   . Hyperlipidemia   . Hyperthyroidism   . Mitral valve prolapse 1997  . Personal history of chemotherapy 1995   BREAST CA  . Personal history of radiation therapy 1995   BREAST CA  . Sjogren's syndrome ([place])   . Vitamin D deficiency      Past Surgical History:  Procedure Laterality Date  . AUGMENTATION MAMMAPLASTY Bilateral   . BREAST LUMPECTOMY  Left 1995  . MASTECTOMY Left 1995   Tram flap W/IMPLANT    Social History   Social History  . Marital status: Divorced    Spouse name: N/A  . Number of children: N/A  . Years of education: N/A    Occupational History  . Not on file.   Social History Main Topics  . Smoking status: Former Smoker    Packs/day: 0.50    Years: 48.00    Types: Cigarettes    Quit date: 02/09/2010  . Smokeless tobacco: Never Used  . Alcohol use Yes     Comment: occ. wine maybe 1  month  . Drug use: No  . Sexual activity: Not on file   Other Topics Concern  . Not on file   Social History Narrative  . No narrative on file    Family History  Problem Relation Age of Onset  . Breast cancer Neg Hx      Current Outpatient Prescriptions:  Marland Kitchen  Multiple Vitamin (MULTIVITAMIN) tablet, Take 1 tablet by mouth daily., Disp: , Rfl:  .  omega-3 acid ethyl esters (LOVAZA) 1 g capsule, Take 1 g by mouth daily., Disp: , Rfl:  .  PARoxetine (PAXIL) 20 MG tablet, Take 20 mg by mouth daily., Disp: , Rfl:  .  pravastatin (PRAVACHOL) 20 MG tablet, Take 20 mg by mouth daily., Disp: , Rfl:  .  Probiotic Product (PROBIOTIC-10) CAPS, Take 1 capsule by mouth daily., Disp: , Rfl:  .  propranolol (INDERAL) 10 MG tablet, Take 10 mg by mouth 3 (three) times daily., Disp: , Rfl:  .  psyllium (METAMUCIL) 58.6 % packet, Take 1 packet by mouth daily as needed. , Disp: , Rfl:  .  raloxifene (EVISTA) 60 MG tablet, Take 60 mg by mouth daily., Disp: , Rfl:   Physical exam:  Vitals:   05/21/16 0937  BP: 118/62  Pulse: 71  Resp: 18  Temp: (!) 96.4 F (35.8 C)  TempSrc: Tympanic  Weight: 125 lb 2 oz (56.8 kg)   Physical Exam  Constitutional: She is oriented to person, place, and time and well-developed, well-nourished, and in no distress.  HENT:  Head: Normocephalic and atraumatic.  Eyes: EOM are normal. Pupils are equal, round, and reactive to light.  Neck: Normal range of motion.  Cardiovascular: Normal rate, regular rhythm and normal heart sounds.   Pulmonary/Chest: Effort normal and breath sounds normal.  Abdominal: Soft. Bowel sounds are normal.  Neurological: She is alert and oriented to person, place, and time.   Skin: Skin is warm and dry.     No flowsheet data found. CBC Latest Ref Rng & Units 05/11/2016  WBC 3.6 - 11.0 K/uL 7.9  Hemoglobin 12.0 - 16.0 g/dL 12.5  Hematocrit 35.0 - 47.0 % 36.5  Platelets 150 - 440 K/uL 618(H)    No images are attached to the encounter.  Ct Bone Marrow Biopsy & Aspiration  Result Date: 05/11/2016 CLINICAL DATA:  Thrombocytosis and history of breast carcinoma. Need for bone marrow biopsy. EXAM: CT GUIDED BONE MARROW ASPIRATION AND BIOPSY ANESTHESIA/SEDATION: Versed 2.0 mg IV, Fentanyl 75 mcg IV Total Moderate Sedation Time:  17 minutes. The patient's level of consciousness and physiologic status were continuously monitored during the procedure by Radiology nursing. PROCEDURE: The procedure risks, benefits, and alternatives were explained to the patient. Questions regarding the procedure were encouraged and answered. The patient understands and consents to the procedure. A time out was performed prior to initiating the procedure. The right gluteal  region was prepped with chlorhexidine. Sterile gown and sterile gloves were used for the procedure. Local anesthesia was provided with 1% Lidocaine. Under CT guidance, an 11 gauge On Control bone cutting needle was advanced from a posterior approach into the right iliac bone. Needle positioning was confirmed with CT. Initial non heparinized and heparinized aspirate samples were obtained of bone marrow. Core biopsy was performed via the On Control drill needle. COMPLICATIONS: None FINDINGS: Inspection of initial aspirate did reveal visible particles. Intact core biopsy sample was obtained. IMPRESSION: CT guided bone marrow biopsy of right posterior iliac bone with both aspirate and core samples obtained. Electronically Signed   By: Aletta Edouard M.D.   On: 05/11/2016 11:12     Assessment and plan- Patient is a 61 y.o. 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 Over a period of time essential thrombocythemia can involve into fibrotic stage or acute leukemia. I recommend starting her on hydroxyurea at 500 mg daily. I will adjust her dose up to 850 mg (23m/kg) based on her counts and side effects.   I discussed the risks and benefits of hydroxyurea including all but not limited to fatigue, leg swelling, mouth ulcers and foot ulcers dizziness headaches. Patient understands and agrees to proceed as planned. Patient will also need to be started on low-dose aspirin 81 mg. She has had problems tolerating aspirin due to GI upset in the past and will try getting enteric-coated aspirin tablet. I will check CBC weekly for the next 4 weeks and LFTs every other week. I will also check LDH and uric acid with the next blood work. I will obtain ultrasound of her abdomen to rule out splenomegaly although clinically I do not appreciate any splenomegaly at this time. I will see the patient back in 4 weeks time with repeat CBC. Goal will be to keep her platelet count between 100-400   Visit Diagnosis 1. Essential thrombocytosis ([facility])      Dr. ARanda Evens MD, MPH [facility] HospitalPager- [phone]/01/2017 1:16 PM

## 2016-05-21 NOTE — Progress Notes (Signed)
Patient offers no complaints today. 

## 2016-05-22 ENCOUNTER — Encounter (HOSPITAL_COMMUNITY): Payer: Self-pay

## 2016-05-25 ENCOUNTER — Other Ambulatory Visit: Payer: Self-pay | Admitting: *Deleted

## 2016-05-25 ENCOUNTER — Telehealth: Payer: Self-pay | Admitting: *Deleted

## 2016-05-25 MED ORDER — ONDANSETRON HCL 4 MG PO TABS: 4.0000 mg | ORAL_TABLET | Freq: Three times a day (TID) | ORAL | 1 refills | Status: DC | PRN

## 2016-05-25 NOTE — Telephone Encounter (Signed)
Per dr Janese Banks , she can have 4 mg every 8 hours for nausea PRN #30, 1 refill. I sent it in to pharmacy if you let pt know. thanks

## 2016-05-25 NOTE — Telephone Encounter (Signed)
Asking if she can have Zofran for nausea from her chemo. Please advise

## 2016-05-25 NOTE — Telephone Encounter (Signed)
Patient informed of Rx being sent to pharmacy, left message on her VM

## 2016-05-27 ENCOUNTER — Ambulatory Visit
Admission: RE | Admit: 2016-05-27 | Discharge: 2016-05-27 | Disposition: A | Discharge status: Home / Self Care | Payer: BLUE CROSS/BLUE SHIELD | Source: Ambulatory Visit | Attending: Oncology | Admitting: Oncology

## 2016-05-27 DIAGNOSIS — D473 Essential (hemorrhagic) thrombocythemia: Secondary | ICD-10-CM | POA: Diagnosis present

## 2016-05-28 ENCOUNTER — Inpatient Hospital Stay: Payer: BLUE CROSS/BLUE SHIELD

## 2016-05-28 DIAGNOSIS — D473 Essential (hemorrhagic) thrombocythemia: Secondary | ICD-10-CM

## 2016-05-28 LAB — CBC WITH DIFFERENTIAL/PLATELET
Basophils Absolute: 0.1 10*3/uL (ref 0–0.1)
Basophils Relative: 1 %
EOS ABS: 0.2 10*3/uL (ref 0–0.7)
EOS PCT: 2 %
HEMATOCRIT: 35.9 % (ref 35.0–47.0)
HEMOGLOBIN: 12.5 g/dL (ref 12.0–16.0)
LYMPHS PCT: 27 %
Lymphs Abs: 2 10*3/uL (ref 1.0–3.6)
MCH: 29.6 pg (ref 26.0–34.0)
MCHC: 34.9 g/dL (ref 32.0–36.0)
MCV: 84.7 fL (ref 80.0–100.0)
MONO ABS: 0.5 10*3/uL (ref 0.2–0.9)
MONOS PCT: 7 %
NEUTROS PCT: 63 %
Neutro Abs: 4.6 10*3/uL (ref 1.4–6.5)
Platelets: 675 10*3/uL — ABNORMAL HIGH (ref 150–440)
RBC: 4.24 MIL/uL (ref 3.80–5.20)
RDW: 13.6 % (ref 11.5–14.5)
WBC: 7.5 10*3/uL (ref 3.6–11.0)

## 2016-05-28 LAB — HEPATIC FUNCTION PANEL
ALBUMIN: 3.9 g/dL (ref 3.5–5.0)
ALK PHOS: 86 U/L (ref 38–126)
ALT: 17 U/L (ref 14–54)
AST: 22 U/L (ref 15–41)
BILIRUBIN TOTAL: 0.4 mg/dL (ref 0.3–1.2)
Bilirubin, Direct: <0.1 mg/dL — ABNORMAL LOW (ref 0.1–0.5)
Total Protein: 6.8 g/dL (ref 6.5–8.1)

## 2016-05-28 NOTE — Progress Notes (Signed)
Please let pt know continue same dose of hydrea. Lets see where her platelets are next week. If it doesn't come down, will increase dose further

## 2016-06-02 ENCOUNTER — Telehealth: Payer: Self-pay | Admitting: *Deleted

## 2016-06-02 NOTE — Telephone Encounter (Signed)
Called and spoke to pt about results and she states she saw results. She will remain on same dose but if her plt are up this week then we will increase dose.  She states that when she first started med she got nauseated but after a few days it went away and she feels fine now. She is agreeable to above plan

## 2016-06-02 NOTE — Telephone Encounter (Signed)
-----   Message from Sindy Guadeloupe, MD sent at 05/28/2016  8:24 AM EDT ----- Please let pt know continue same dose of hydrea. Lets see where her platelets are next week. If it doesn't come down, will increase dose further

## 2016-06-04 ENCOUNTER — Inpatient Hospital Stay: Payer: BLUE CROSS/BLUE SHIELD

## 2016-06-04 ENCOUNTER — Telehealth: Payer: Self-pay | Admitting: *Deleted

## 2016-06-04 DIAGNOSIS — D473 Essential (hemorrhagic) thrombocythemia: Secondary | ICD-10-CM

## 2016-06-04 LAB — CBC WITH DIFFERENTIAL/PLATELET
Basophils Absolute: 0.1 10*3/uL (ref 0–0.1)
Basophils Relative: 1 %
Eosinophils Absolute: 0.2 10*3/uL (ref 0–0.7)
Eosinophils Relative: 2 %
HCT: 33.8 % — ABNORMAL LOW (ref 35.0–47.0)
HEMOGLOBIN: 11.7 g/dL — AB (ref 12.0–16.0)
LYMPHS ABS: 1.8 10*3/uL (ref 1.0–3.6)
LYMPHS PCT: 23 %
MCH: 29.5 pg (ref 26.0–34.0)
MCHC: 34.6 g/dL (ref 32.0–36.0)
MCV: 85.4 fL (ref 80.0–100.0)
Monocytes Absolute: 0.6 10*3/uL (ref 0.2–0.9)
Monocytes Relative: 8 %
NEUTROS PCT: 66 %
Neutro Abs: 5.2 10*3/uL (ref 1.4–6.5)
Platelets: 604 10*3/uL — ABNORMAL HIGH (ref 150–440)
RBC: 3.95 MIL/uL (ref 3.80–5.20)
RDW: 14.3 % (ref 11.5–14.5)
WBC: 7.9 10*3/uL (ref 3.6–11.0)

## 2016-06-04 NOTE — Telephone Encounter (Signed)
-----   Message from Sindy Guadeloupe, MD sent at 06/04/2016  8:35 AM EDT ----- Patient is on hydrea 500 mg daily. Please have her take hydrea 1000 mg on 4 days and 500 mg on 3 days and recheck cbc in 10 days   ----- Message ----- From: Buel Ream, [facility] [place] Sent: 06/04/2016   8:26 AM To: Sindy Guadeloupe, MD

## 2016-06-04 NOTE — Telephone Encounter (Signed)
Called pt and let her know that her plt count did come down to 604 and it is lower than it was last week.  But she wants to get her down to 450 so she does want to increase hydrea .  This is the following directions: Mon, Wed, and Fri 1 pill, then Tues, Thurs, Sat. And Nancy Fetter take 2 tablets a day.  We will see her back 5/3.  If she needs refill to call me and we would see her on 5/3 that is already scheduled.

## 2016-06-11 ENCOUNTER — Inpatient Hospital Stay: Payer: BLUE CROSS/BLUE SHIELD | Attending: Oncology

## 2016-06-11 DIAGNOSIS — D649 Anemia, unspecified: Secondary | ICD-10-CM | POA: Diagnosis not present

## 2016-06-11 DIAGNOSIS — E559 Vitamin D deficiency, unspecified: Secondary | ICD-10-CM | POA: Insufficient documentation

## 2016-06-11 DIAGNOSIS — Z8639 Personal history of other endocrine, nutritional and metabolic disease: Secondary | ICD-10-CM | POA: Insufficient documentation

## 2016-06-11 DIAGNOSIS — D473 Essential (hemorrhagic) thrombocythemia: Secondary | ICD-10-CM | POA: Diagnosis present

## 2016-06-11 DIAGNOSIS — F329 Major depressive disorder, single episode, unspecified: Secondary | ICD-10-CM | POA: Insufficient documentation

## 2016-06-11 DIAGNOSIS — Z9481 Bone marrow transplant status: Secondary | ICD-10-CM | POA: Insufficient documentation

## 2016-06-11 DIAGNOSIS — Z9221 Personal history of antineoplastic chemotherapy: Secondary | ICD-10-CM | POA: Insufficient documentation

## 2016-06-11 DIAGNOSIS — Z923 Personal history of irradiation: Secondary | ICD-10-CM | POA: Insufficient documentation

## 2016-06-11 DIAGNOSIS — E0590 Thyrotoxicosis, unspecified without thyrotoxic crisis or storm: Secondary | ICD-10-CM | POA: Insufficient documentation

## 2016-06-11 DIAGNOSIS — Z79899 Other long term (current) drug therapy: Secondary | ICD-10-CM | POA: Insufficient documentation

## 2016-06-11 DIAGNOSIS — E785 Hyperlipidemia, unspecified: Secondary | ICD-10-CM | POA: Diagnosis not present

## 2016-06-11 DIAGNOSIS — R0602 Shortness of breath: Secondary | ICD-10-CM | POA: Diagnosis not present

## 2016-06-11 DIAGNOSIS — K219 Gastro-esophageal reflux disease without esophagitis: Secondary | ICD-10-CM | POA: Insufficient documentation

## 2016-06-11 DIAGNOSIS — Z87891 Personal history of nicotine dependence: Secondary | ICD-10-CM | POA: Insufficient documentation

## 2016-06-11 DIAGNOSIS — Z853 Personal history of malignant neoplasm of breast: Secondary | ICD-10-CM | POA: Diagnosis not present

## 2016-06-11 DIAGNOSIS — M3500 Sicca syndrome, unspecified: Secondary | ICD-10-CM | POA: Insufficient documentation

## 2016-06-11 DIAGNOSIS — K449 Diaphragmatic hernia without obstruction or gangrene: Secondary | ICD-10-CM | POA: Diagnosis not present

## 2016-06-11 DIAGNOSIS — Z9012 Acquired absence of left breast and nipple: Secondary | ICD-10-CM | POA: Insufficient documentation

## 2016-06-11 LAB — CBC WITH DIFFERENTIAL/PLATELET
BASOS PCT: 2 %
Basophils Absolute: 0.1 10*3/uL (ref 0–0.1)
EOS ABS: 0.1 10*3/uL (ref 0–0.7)
EOS PCT: 2 %
HCT: 34 % — ABNORMAL LOW (ref 35.0–47.0)
HEMOGLOBIN: 11.8 g/dL — AB (ref 12.0–16.0)
LYMPHS ABS: 1.6 10*3/uL (ref 1.0–3.6)
Lymphocytes Relative: 28 %
MCH: 30 pg (ref 26.0–34.0)
MCHC: 34.7 g/dL (ref 32.0–36.0)
MCV: 86.4 fL (ref 80.0–100.0)
MONOS PCT: 10 %
Monocytes Absolute: 0.6 10*3/uL (ref 0.2–0.9)
NEUTROS PCT: 58 %
Neutro Abs: 3.4 10*3/uL (ref 1.4–6.5)
PLATELETS: 480 10*3/uL — AB (ref 150–440)
RBC: 3.93 MIL/uL (ref 3.80–5.20)
RDW: 14.6 % — ABNORMAL HIGH (ref 11.5–14.5)
WBC: 5.7 10*3/uL (ref 3.6–11.0)

## 2016-06-11 LAB — HEPATIC FUNCTION PANEL
ALBUMIN: 3.6 g/dL (ref 3.5–5.0)
ALT: 19 U/L (ref 14–54)
AST: 21 U/L (ref 15–41)
Alkaline Phosphatase: 76 U/L (ref 38–126)
Bilirubin, Direct: <0.1 mg/dL — ABNORMAL LOW (ref 0.1–0.5)
TOTAL PROTEIN: 6.5 g/dL (ref 6.5–8.1)
Total Bilirubin: 0.5 mg/dL (ref 0.3–1.2)

## 2016-06-18 ENCOUNTER — Encounter: Payer: Self-pay | Admitting: Oncology

## 2016-06-18 ENCOUNTER — Inpatient Hospital Stay: Payer: BLUE CROSS/BLUE SHIELD

## 2016-06-18 ENCOUNTER — Inpatient Hospital Stay (HOSPITAL_BASED_OUTPATIENT_CLINIC_OR_DEPARTMENT_OTHER): Payer: BLUE CROSS/BLUE SHIELD | Admitting: Oncology

## 2016-06-18 VITALS — BP 128/70 | HR 67 | Temp 98.0°F | Ht 64.0 in | Wt 125.4 lb

## 2016-06-18 DIAGNOSIS — D473 Essential (hemorrhagic) thrombocythemia: Secondary | ICD-10-CM

## 2016-06-18 DIAGNOSIS — Z9012 Acquired absence of left breast and nipple: Secondary | ICD-10-CM | POA: Diagnosis not present

## 2016-06-18 DIAGNOSIS — R0602 Shortness of breath: Secondary | ICD-10-CM

## 2016-06-18 DIAGNOSIS — F329 Major depressive disorder, single episode, unspecified: Secondary | ICD-10-CM | POA: Diagnosis not present

## 2016-06-18 DIAGNOSIS — E559 Vitamin D deficiency, unspecified: Secondary | ICD-10-CM | POA: Diagnosis not present

## 2016-06-18 DIAGNOSIS — K219 Gastro-esophageal reflux disease without esophagitis: Secondary | ICD-10-CM | POA: Diagnosis not present

## 2016-06-18 DIAGNOSIS — E785 Hyperlipidemia, unspecified: Secondary | ICD-10-CM

## 2016-06-18 DIAGNOSIS — Z87891 Personal history of nicotine dependence: Secondary | ICD-10-CM

## 2016-06-18 DIAGNOSIS — K449 Diaphragmatic hernia without obstruction or gangrene: Secondary | ICD-10-CM

## 2016-06-18 DIAGNOSIS — Z79899 Other long term (current) drug therapy: Secondary | ICD-10-CM

## 2016-06-18 DIAGNOSIS — E0590 Thyrotoxicosis, unspecified without thyrotoxic crisis or storm: Secondary | ICD-10-CM

## 2016-06-18 DIAGNOSIS — M3500 Sicca syndrome, unspecified: Secondary | ICD-10-CM | POA: Diagnosis not present

## 2016-06-18 DIAGNOSIS — Z9221 Personal history of antineoplastic chemotherapy: Secondary | ICD-10-CM

## 2016-06-18 DIAGNOSIS — Z8639 Personal history of other endocrine, nutritional and metabolic disease: Secondary | ICD-10-CM

## 2016-06-18 DIAGNOSIS — Z9481 Bone marrow transplant status: Secondary | ICD-10-CM

## 2016-06-18 DIAGNOSIS — Z853 Personal history of malignant neoplasm of breast: Secondary | ICD-10-CM | POA: Diagnosis not present

## 2016-06-18 DIAGNOSIS — D649 Anemia, unspecified: Secondary | ICD-10-CM | POA: Diagnosis not present

## 2016-06-18 DIAGNOSIS — Z923 Personal history of irradiation: Secondary | ICD-10-CM

## 2016-06-18 LAB — CBC WITH DIFFERENTIAL/PLATELET
BASOS ABS: 0.1 10*3/uL (ref 0–0.1)
BASOS PCT: 1 %
Eosinophils Absolute: 0.1 10*3/uL (ref 0–0.7)
Eosinophils Relative: 2 %
HEMATOCRIT: 34.6 % — AB (ref 35.0–47.0)
HEMOGLOBIN: 11.9 g/dL — AB (ref 12.0–16.0)
LYMPHS PCT: 23 %
Lymphs Abs: 1.5 10*3/uL (ref 1.0–3.6)
MCH: 30.2 pg (ref 26.0–34.0)
MCHC: 34.5 g/dL (ref 32.0–36.0)
MCV: 87.7 fL (ref 80.0–100.0)
Monocytes Absolute: 0.6 10*3/uL (ref 0.2–0.9)
Monocytes Relative: 10 %
NEUTROS ABS: 4.2 10*3/uL (ref 1.4–6.5)
NEUTROS PCT: 64 %
Platelets: 443 10*3/uL — ABNORMAL HIGH (ref 150–440)
RBC: 3.95 MIL/uL (ref 3.80–5.20)
RDW: 15.3 % — AB (ref 11.5–14.5)
WBC: 6.5 10*3/uL (ref 3.6–11.0)

## 2016-06-18 MED ORDER — HYDROXYUREA 500 MG PO CAPS: 500.0000 mg | ORAL_CAPSULE | ORAL | 1 refills | Status: DC

## 2016-06-18 NOTE — Progress Notes (Signed)
Patient here for follow up. She has been diagnosed with Graves Disease since her last appt.

## 2016-06-18 NOTE — Progress Notes (Signed)
Hematology/Oncology Consult note [facility]  [phone] [phone]  Patient Care Team: Glendon Axe, MD as PCP - General (Internal Medicine)   Name of the patient: Karen [name]  [id number]  22-Jun-1955   Date of visit: 06/18/16  Diagnosis- essential thrombocythemia JAK2 positive  Chief complaint/ Reason for visit- routine f/u  Heme/Onc history: patient is a 61 year old female with a history of Sjogren syndrome and autoimmune thyroid disorder. She also had left breast cancer at 61 years of age and underwent mastectomy as well as chemotherapy and autologus bone marrow transplant in 1994. She has a history of mitral valve prolapse. She has been referred to [place] for evaluation of thrombocytosis. She has a h/o hyperthyroidism for which she was on medication in the past. Recently she has been started on propranolol and is awaiting endocrinology referral.  Most recent CBC from 03/30/2016 showed white count of 7.5, H&H of 12.4/36.8 and a platelet count of 727. MCV was normal at 87.2. CMP was within normal limits except for potassium of 5.9. TSH was less than 0.01. Platelet count in December 2017 was 664 and in October 2017 it was 621. In November 2016 her platelet count was 654 and 738.   Results of bloodwork from 04/17/2016 were as follows: CBC showed white count of 8.6, H&H of 12.9/37.9 and a platelet count of 718. BCR able testing was negative. Jak 2 mutation testing was positive. CALR and MPL mutations were negative. Iron studies were within normal limits. ESR and CRP was normal.  Bone marrow biopsy from 05/12/2016 showed very little cellularity in the bone marrow. Increase in atypical megakaryocytes. No evidence of bone marrow fibrosis. Findings consistent with myeloproliferative neoplasm particularly essential thrombocythemia  Risks and benefits of Hydrea were discussed on 05/21/2016 and patient was started on the same. Currently on  thousand milligrams of Hydrea 4 times a week and 500 mg 3 times a week  Interval history- she is tolerating hydroxyurea well. Reports no headaches, dizziness mouth sores or leg ulcers. In fact she reports feeling better overall. She is also been started on methimazole by endocrinology for hyperthyroidism   Review of systems- Review of Systems  Constitutional: Negative for chills, fever, malaise/fatigue and weight loss.  HENT: Negative for congestion, ear discharge and nosebleeds.   Eyes: Negative for blurred vision.  Respiratory: Negative for cough, hemoptysis, sputum production, shortness of breath and wheezing.   Cardiovascular: Negative for chest pain, palpitations, orthopnea and claudication.  Gastrointestinal: Negative for abdominal pain, blood in stool, constipation, diarrhea, heartburn, melena, nausea and vomiting.  Genitourinary: Negative for dysuria, flank pain, frequency, hematuria and urgency.  Musculoskeletal: Negative for back pain, joint pain and myalgias.  Skin: Negative for rash.  Neurological: Negative for dizziness, tingling, focal weakness, seizures, weakness and headaches.  Endo/Heme/Allergies: Does not bruise/bleed easily.  Psychiatric/Behavioral: Negative for depression and suicidal ideas. The patient does not have insomnia.      Current treatment- hydrea  No Known Allergies   Past Medical History:  Diagnosis Date  . Anemia   . Arthritis   . Breast cancer ([place]) 1995   left breast cancer - chemo and modified mastectomy  . Depression   . Dyspnea   . GERD (gastroesophageal reflux disease)   . History of hiatal hernia   . Hyperlipidemia   . Hyperthyroidism   . Mitral valve prolapse 1997  . Personal history of chemotherapy 1995   BREAST CA  . Personal history of radiation therapy 1995   BREAST  CA  . Sjogren's syndrome ([place])   . Vitamin D deficiency      Past Surgical History:  Procedure Laterality Date  . AUGMENTATION MAMMAPLASTY Bilateral   . BREAST  LUMPECTOMY Left 1995  . MASTECTOMY Left 1995   Tram flap W/IMPLANT    Social History   Social History  . Marital status: Divorced    Spouse name: N/A  . Number of children: N/A  . Years of education: N/A   Occupational History  . Not on file.   Social History Main Topics  . Smoking status: Former Smoker    Packs/day: 0.50    Years: 48.00    Types: Cigarettes    Quit date: 02/09/2010  . Smokeless tobacco: Never Used  . Alcohol use Yes     Comment: occ. wine maybe 1  month  . Drug use: No  . Sexual activity: Not on file   Other Topics Concern  . Not on file   Social History Narrative  . No narrative on file    Family History  Problem Relation Age of Onset  . Breast cancer Neg Hx      Current Outpatient Prescriptions:  .  hydroxyurea (HYDREA) 500 MG capsule, Take 1 capsule (500 mg total) by mouth daily. May take with food to minimize GI side effects., Disp: 30 capsule, Rfl: 1 .  Multiple Vitamin (MULTIVITAMIN) tablet, Take 1 tablet by mouth daily., Disp: , Rfl:  .  omega-3 acid ethyl esters (LOVAZA) 1 g capsule, Take 1 g by mouth daily., Disp: , Rfl:  .  ondansetron (ZOFRAN) 4 MG tablet, Take 1 tablet (4 mg total) by mouth every 8 (eight) hours as needed for nausea or vomiting., Disp: 30 tablet, Rfl: 1 .  PARoxetine (PAXIL) 20 MG tablet, Take 20 mg by mouth daily., Disp: , Rfl:  .  pravastatin (PRAVACHOL) 20 MG tablet, Take 20 mg by mouth daily., Disp: , Rfl:  .  Probiotic Product (PROBIOTIC-10) CAPS, Take 1 capsule by mouth daily., Disp: , Rfl:  .  propranolol (INDERAL) 10 MG tablet, Take 10 mg by mouth 3 (three) times daily., Disp: , Rfl:  .  psyllium (METAMUCIL) 58.6 % packet, Take 1 packet by mouth daily as needed. , Disp: , Rfl:  .  raloxifene (EVISTA) 60 MG tablet, Take 60 mg by mouth daily., Disp: , Rfl:   Physical exam:  Vitals:   06/18/16 0842  BP: 128/70  Pulse: 67  Temp: 98 F (36.7 C)  TempSrc: Oral  Weight: 125 lb 6.4 oz (56.9 kg)  Height: 5' 4"   (1.626 m)   Physical Exam  Constitutional: She is oriented to person, place, and time and well-developed, well-nourished, and in no distress.  HENT:  Head: Normocephalic and atraumatic.  Eyes: EOM are normal. Pupils are equal, round, and reactive to light.  Neck: Normal range of motion.  Cardiovascular: Normal rate, regular rhythm and normal heart sounds.   Pulmonary/Chest: Effort normal and breath sounds normal.  Abdominal: Soft. Bowel sounds are normal.  Neurological: She is alert and oriented to person, place, and time.  Skin: Skin is warm and dry.     CMP Latest Ref Rng & Units 06/11/2016  Total Protein 6.5 - 8.1 g/dL 6.5  Total Bilirubin 0.3 - 1.2 mg/dL 0.5  Alkaline Phos 38 - 126 U/L 76  AST 15 - 41 U/L 21  ALT 14 - 54 U/L 19   CBC Latest Ref Rng & Units 06/18/2016  WBC 3.6 - 11.0 K/uL  6.5  Hemoglobin 12.0 - 16.0 g/dL 11.9(L)  Hematocrit 35.0 - 47.0 % 34.6(L)  Platelets 150 - 440 K/uL 443(H)    No images are attached to the encounter.  US Abdomen Limited  Result Date: 05/27/2016 CLINICAL DATA:  Essential thrombocytosis EXAM: LIMITED ABDOMINAL ULTRASOUND COMPARISON:  None. FINDINGS: Spleen is well visualize without evidence of focal mass lesion. The splenic volume is calculated 159.5 cm cubic cm. The maximum length is 3.4 cm. IMPRESSION: Normal appearing spleen.  No splenomegaly is noted. Electronically Signed   By: Inez Catalina M.D.   On: 05/27/2016 10:00     Assessment and plan- Patient is a 61 y.o. female with history of essential thrombocytosis currently on hydroxyurea  Goal platelet count would be less than 400. Today her platelet count is 443. I will not make any changes to her dosing today. We will repeat her CBC in 1 month's time and based on the levels we will decide about changing her dose then. I will see her back in 3 months time with repeat CBC and CMP. She will also continue low dose aspirin. She is mildly anemic which we will continue to monitor   Visit  Diagnosis 1. Essential thrombocytosis ([place])   2. High risk medication use      Dr. Randa Evens, MD, MPH [facility] Pager- [phone] 06/18/2016 9:17 AM

## 2016-07-02 ENCOUNTER — Other Ambulatory Visit: Payer: Self-pay | Admitting: Oncology

## 2016-07-02 DIAGNOSIS — D473 Essential (hemorrhagic) thrombocythemia: Secondary | ICD-10-CM

## 2016-07-20 ENCOUNTER — Inpatient Hospital Stay: Payer: BLUE CROSS/BLUE SHIELD | Attending: Oncology | Admitting: Oncology

## 2016-07-20 DIAGNOSIS — D473 Essential (hemorrhagic) thrombocythemia: Secondary | ICD-10-CM | POA: Diagnosis not present

## 2016-07-20 LAB — CBC WITH DIFFERENTIAL/PLATELET
BASOS ABS: 0 10*3/uL (ref 0–0.1)
BASOS PCT: 1 %
EOS ABS: 0.1 10*3/uL (ref 0–0.7)
EOS PCT: 1 %
HCT: 32.9 % — ABNORMAL LOW (ref 35.0–47.0)
Hemoglobin: 11.5 g/dL — ABNORMAL LOW (ref 12.0–16.0)
LYMPHS ABS: 2 10*3/uL (ref 1.0–3.6)
Lymphocytes Relative: 39 %
MCH: 32.3 pg (ref 26.0–34.0)
MCHC: 35.1 g/dL (ref 32.0–36.0)
MCV: 91.9 fL (ref 80.0–100.0)
Monocytes Absolute: 0.5 10*3/uL (ref 0.2–0.9)
Monocytes Relative: 9 %
NEUTROS PCT: 50 %
Neutro Abs: 2.4 10*3/uL (ref 1.4–6.5)
PLATELETS: 238 10*3/uL (ref 150–440)
RBC: 3.58 MIL/uL — AB (ref 3.80–5.20)
RDW: 23.8 % — ABNORMAL HIGH (ref 11.5–14.5)
WBC: 5 10*3/uL (ref 3.6–11.0)

## 2016-07-20 NOTE — Progress Notes (Signed)
Please let patient know his platelets are at goal <400. Repeat cbc next month

## 2016-07-21 ENCOUNTER — Telehealth: Payer: Self-pay

## 2016-07-21 NOTE — Telephone Encounter (Signed)
Call to pt at home to notify per Dr Janese Banks order that 1) platelets are below 400, 2 ) stay on current medication regime, and 3)we will repeat CBC next month. Pt acknowledged understanding .

## 2016-07-21 NOTE — Telephone Encounter (Signed)
-----   Message from Luella Cook, RN sent at 07/21/2016  4:36 PM EDT -----   ----- Message ----- From: Sindy Guadeloupe, MD Sent: 07/20/2016   8:26 AM To: Luella Cook, RN  Please let patient know his platelets are at goal <400. Repeat cbc next month

## 2016-07-27 ENCOUNTER — Other Ambulatory Visit: Payer: Self-pay | Admitting: Oncology

## 2016-07-27 DIAGNOSIS — D473 Essential (hemorrhagic) thrombocythemia: Secondary | ICD-10-CM

## 2016-08-05 ENCOUNTER — Telehealth: Payer: Self-pay | Admitting: *Deleted

## 2016-08-05 DIAGNOSIS — D473 Essential (hemorrhagic) thrombocythemia: Secondary | ICD-10-CM

## 2016-08-05 NOTE — Telephone Encounter (Signed)
Pt called and said that she is going be out of town next week and she is feeling crappy and just wanted to see if she could come in and get labs on fridya of this week. I called Dr. Janese Banks ans she said yes and I called pt back and got her voicemail and I left message that it is ok to come and have labs. I checked with Dr. Janese Banks and once her labs are back if something needs to be done then I asked Dr. Janese Banks to call her back. I asked her to call me and tell me what time she wants to come for her labs on Friday.

## 2016-08-07 ENCOUNTER — Inpatient Hospital Stay: Payer: BLUE CROSS/BLUE SHIELD | Admitting: *Deleted

## 2016-08-07 ENCOUNTER — Encounter: Payer: Self-pay | Admitting: *Deleted

## 2016-08-07 ENCOUNTER — Other Ambulatory Visit: Payer: Self-pay | Admitting: *Deleted

## 2016-08-07 DIAGNOSIS — D473 Essential (hemorrhagic) thrombocythemia: Secondary | ICD-10-CM | POA: Diagnosis not present

## 2016-08-07 LAB — CBC WITH DIFFERENTIAL/PLATELET
BASOS PCT: 1 %
Basophils Absolute: 0 10*3/uL (ref 0–0.1)
EOS ABS: 0.1 10*3/uL (ref 0–0.7)
Eosinophils Relative: 1 %
HEMATOCRIT: 33.5 % — AB (ref 35.0–47.0)
HEMOGLOBIN: 11.7 g/dL — AB (ref 12.0–16.0)
LYMPHS ABS: 2.1 10*3/uL (ref 1.0–3.6)
Lymphocytes Relative: 41 %
MCH: 33.6 pg (ref 26.0–34.0)
MCHC: 35 g/dL (ref 32.0–36.0)
MCV: 95.9 fL (ref 80.0–100.0)
MONO ABS: 0.5 10*3/uL (ref 0.2–0.9)
Monocytes Relative: 9 %
NEUTROS ABS: 2.5 10*3/uL (ref 1.4–6.5)
Neutrophils Relative %: 48 %
Platelets: 293 10*3/uL (ref 150–440)
RBC: 3.49 MIL/uL — ABNORMAL LOW (ref 3.80–5.20)
RDW: 26 % — AB (ref 11.5–14.5)
WBC: 5.1 10*3/uL (ref 3.6–11.0)

## 2016-08-07 NOTE — Patient Instructions (Signed)
Called and left message on VM regarding dose adjustment of Hydrea to reduce by 1 capsule a week. I asked that she return my call

## 2016-08-07 NOTE — Progress Notes (Signed)
Please let patient know her counts are stable. No change in hydrea dosing at this time

## 2016-08-07 NOTE — Telephone Encounter (Signed)
error 

## 2016-08-10 ENCOUNTER — Encounter: Payer: Self-pay | Admitting: Internal Medicine

## 2016-08-11 MED ORDER — HYDROXYUREA 500 MG PO CAPS: ORAL_CAPSULE | ORAL | 0 refills | Status: DC

## 2016-08-17 ENCOUNTER — Inpatient Hospital Stay: Payer: BLUE CROSS/BLUE SHIELD | Attending: Oncology

## 2016-09-17 ENCOUNTER — Other Ambulatory Visit: Payer: Self-pay | Admitting: Oncology

## 2016-09-17 DIAGNOSIS — C50512 Malignant neoplasm of lower-outer quadrant of left female breast: Secondary | ICD-10-CM

## 2016-09-18 ENCOUNTER — Encounter: Payer: Self-pay | Admitting: Oncology

## 2016-09-18 ENCOUNTER — Inpatient Hospital Stay: Payer: BLUE CROSS/BLUE SHIELD

## 2016-09-18 ENCOUNTER — Inpatient Hospital Stay: Payer: BLUE CROSS/BLUE SHIELD | Attending: Oncology | Admitting: Oncology

## 2016-09-18 VITALS — BP 117/75 | HR 61 | Temp 98.4°F | Resp 18 | Ht 64.0 in | Wt 127.7 lb

## 2016-09-18 DIAGNOSIS — F329 Major depressive disorder, single episode, unspecified: Secondary | ICD-10-CM

## 2016-09-18 DIAGNOSIS — D7589 Other specified diseases of blood and blood-forming organs: Secondary | ICD-10-CM

## 2016-09-18 DIAGNOSIS — Z79899 Other long term (current) drug therapy: Secondary | ICD-10-CM | POA: Diagnosis not present

## 2016-09-18 DIAGNOSIS — E0590 Thyrotoxicosis, unspecified without thyrotoxic crisis or storm: Secondary | ICD-10-CM | POA: Insufficient documentation

## 2016-09-18 DIAGNOSIS — R0600 Dyspnea, unspecified: Secondary | ICD-10-CM | POA: Diagnosis not present

## 2016-09-18 DIAGNOSIS — I341 Nonrheumatic mitral (valve) prolapse: Secondary | ICD-10-CM | POA: Insufficient documentation

## 2016-09-18 DIAGNOSIS — K449 Diaphragmatic hernia without obstruction or gangrene: Secondary | ICD-10-CM | POA: Diagnosis not present

## 2016-09-18 DIAGNOSIS — D473 Essential (hemorrhagic) thrombocythemia: Secondary | ICD-10-CM | POA: Diagnosis not present

## 2016-09-18 DIAGNOSIS — Z87891 Personal history of nicotine dependence: Secondary | ICD-10-CM | POA: Insufficient documentation

## 2016-09-18 DIAGNOSIS — M3500 Sicca syndrome, unspecified: Secondary | ICD-10-CM | POA: Insufficient documentation

## 2016-09-18 DIAGNOSIS — Z853 Personal history of malignant neoplasm of breast: Secondary | ICD-10-CM | POA: Insufficient documentation

## 2016-09-18 DIAGNOSIS — K219 Gastro-esophageal reflux disease without esophagitis: Secondary | ICD-10-CM

## 2016-09-18 DIAGNOSIS — D649 Anemia, unspecified: Secondary | ICD-10-CM

## 2016-09-18 DIAGNOSIS — Z9011 Acquired absence of right breast and nipple: Secondary | ICD-10-CM

## 2016-09-18 DIAGNOSIS — E785 Hyperlipidemia, unspecified: Secondary | ICD-10-CM | POA: Diagnosis not present

## 2016-09-18 DIAGNOSIS — C50512 Malignant neoplasm of lower-outer quadrant of left female breast: Secondary | ICD-10-CM

## 2016-09-18 LAB — COMPREHENSIVE METABOLIC PANEL
ALBUMIN: 4 g/dL (ref 3.5–5.0)
ALK PHOS: 108 U/L (ref 38–126)
ALT: 14 U/L (ref 14–54)
AST: 21 U/L (ref 15–41)
Anion gap: 8 (ref 5–15)
BUN: 23 mg/dL — ABNORMAL HIGH (ref 6–20)
CALCIUM: 8.7 mg/dL — AB (ref 8.9–10.3)
CHLORIDE: 109 mmol/L (ref 101–111)
CO2: 22 mmol/L (ref 22–32)
CREATININE: 0.86 mg/dL (ref 0.44–1.00)
GFR calc Af Amer: >60 mL/min (ref >60)
GFR calc non Af Amer: >60 mL/min (ref >60)
GLUCOSE: 111 mg/dL — AB (ref 65–99)
Potassium: 4.1 mmol/L (ref 3.5–5.1)
SODIUM: 139 mmol/L (ref 135–145)
Total Bilirubin: 0.7 mg/dL (ref 0.3–1.2)
Total Protein: 6.8 g/dL (ref 6.5–8.1)

## 2016-09-18 LAB — CBC WITH DIFFERENTIAL/PLATELET
BASOS ABS: 0 10*3/uL (ref 0–0.1)
BASOS PCT: 1 %
EOS ABS: 0.1 10*3/uL (ref 0–0.7)
Eosinophils Relative: 2 %
HCT: 32.6 % — ABNORMAL LOW (ref 35.0–47.0)
HEMOGLOBIN: 11.6 g/dL — AB (ref 12.0–16.0)
Lymphocytes Relative: 33 %
Lymphs Abs: 1.3 10*3/uL (ref 1.0–3.6)
MCH: 37.8 pg — ABNORMAL HIGH (ref 26.0–34.0)
MCHC: 35.6 g/dL (ref 32.0–36.0)
MCV: 106.1 fL — ABNORMAL HIGH (ref 80.0–100.0)
Monocytes Absolute: 0.3 10*3/uL (ref 0.2–0.9)
Monocytes Relative: 9 %
NEUTROS PCT: 55 %
Neutro Abs: 2.2 10*3/uL (ref 1.4–6.5)
Platelets: 248 10*3/uL (ref 150–440)
RBC: 3.07 MIL/uL — AB (ref 3.80–5.20)
RDW: 20.7 % — ABNORMAL HIGH (ref 11.5–14.5)
WBC: 3.9 10*3/uL (ref 3.6–11.0)

## 2016-09-18 NOTE — Progress Notes (Signed)
Fatigue is her only complaint, she still goes to work every day. She is trying a D74 with folic acid liq once a day

## 2016-09-18 NOTE — Progress Notes (Signed)
Hematology/Oncology Consult note [facility] [place]  Telephone:([phone] [phone]  Patient Care Team: Glendon Axe, MD as PCP - General (Internal Medicine)   Name of the patient: Karen [name]  [id number]  07/30/1955   Date of visit: 09/18/16  Diagnosis- essential thrombocythemia JAK2 positive  Chief complaint/ Reason for visit- routine f/u  Heme/Onc history: patient is a 61 year old female with a history of Sjogren syndrome and autoimmune thyroid disorder. She also had left breast cancer at 61 years of age and underwent mastectomy as well as chemotherapy and autologus bone marrow transplant in 1994. She has a history of mitral valve prolapse. She has been referred to [place] for evaluation of thrombocytosis. She has a h/o hyperthyroidism for which she was on medication in the past. Recently she has been started on propranolol and is awaiting endocrinology referral.  Most recent CBC from 03/30/2016 showed white count of 7.5, H&H of 12.4/36.8 and a platelet count of 727. MCV was normal at 87.2. CMP was within normal limits except for potassium of 5.9. TSH was less than 0.01. Platelet count in December 2017 was 664 and in October 2017 it was 621. In November 2016 her platelet count was 654 and 738.   Results of bloodwork from 04/17/2016 were as follows: CBC showed white count of 8.6, H&H of 12.9/37.9 and a platelet count of 718. BCR able testing was negative. Jak 2 mutation testing was positive. CALR and MPL mutations were negative. Iron studies were within normal limits. ESR and CRP was normal.  Bone marrow biopsy from 05/12/2016 showed very little cellularity in the bone marrow. Increase in atypical megakaryocytes. No evidence of bone marrow fibrosis. Findings consistent with myeloproliferative neoplasm particularly essential thrombocythemia  Risks and benefits of Hydrea were discussed on 05/21/2016 and patient was started on the same. Currently on  thousand milligrams of Hydrea 3 times a week and 500 mg 4 times a week   Interval history- reporst significant fatigue. She is still working 4 times a week   ECOG PS- 0 Pain scale- 0   Review of systems- Review of Systems  Constitutional: Positive for malaise/fatigue. Negative for chills, fever and weight loss.  HENT: Negative for congestion, ear discharge and nosebleeds.   Eyes: Negative for blurred vision.  Respiratory: Negative for cough, hemoptysis, sputum production, shortness of breath and wheezing.   Cardiovascular: Negative for chest pain, palpitations, orthopnea and claudication.  Gastrointestinal: Negative for abdominal pain, blood in stool, constipation, diarrhea, heartburn, melena, nausea and vomiting.  Genitourinary: Negative for dysuria, flank pain, frequency, hematuria and urgency.  Musculoskeletal: Negative for back pain, joint pain and myalgias.  Skin: Negative for rash.  Neurological: Negative for dizziness, tingling, focal weakness, seizures, weakness and headaches.  Endo/Heme/Allergies: Does not bruise/bleed easily.  Psychiatric/Behavioral: Negative for depression and suicidal ideas. The patient does not have insomnia.     No Known Allergies   Past Medical History:  Diagnosis Date  . Anemia   . Arthritis   . Breast cancer ([place]) 1995   left breast cancer - chemo and modified mastectomy  . Depression   . Dyspnea   . GERD (gastroesophageal reflux disease)   . History of hiatal hernia   . Hyperlipidemia   . Hyperthyroidism   . Mitral valve prolapse 1997  . Personal history of chemotherapy 1995   BREAST CA  . Personal history of radiation therapy 1995   BREAST CA  . Sjogren's syndrome ([place])   . Vitamin D deficiency  Past Surgical History:  Procedure Laterality Date  . AUGMENTATION MAMMAPLASTY Bilateral   . BREAST LUMPECTOMY Left 1995  . MASTECTOMY Left 1995   Tram flap W/IMPLANT    Social History   Social History  . Marital status:  Divorced    Spouse name: N/A  . Number of children: N/A  . Years of education: N/A   Occupational History  . Not on file.   Social History Main Topics  . Smoking status: Former Smoker    Packs/day: 0.50    Years: 48.00    Types: Cigarettes    Quit date: 02/09/2010  . Smokeless tobacco: Never Used  . Alcohol use Yes     Comment: occ. wine maybe 1  month  . Drug use: No  . Sexual activity: Not on file   Other Topics Concern  . Not on file   Social History Narrative  . No narrative on file    Family History  Problem Relation Age of Onset  . Breast cancer Neg Hx      Current Outpatient Prescriptions:  .  hydroxyurea (HYDREA) 500 MG capsule, Take 2 capsule on Monday, Wednesday, and Friday. Take 1 capsules on Tuesday, Thursday, Saturday and Sunday, Disp: 47 capsule, Rfl: 0 .  methimazole (TAPAZOLE) 10 MG tablet, Take 10 mg by mouth daily. , Disp: , Rfl:  .  Multiple Vitamin (MULTIVITAMIN) tablet, Take 1 tablet by mouth daily., Disp: , Rfl:  .  omega-3 acid ethyl esters (LOVAZA) 1 g capsule, Take 1 g by mouth daily., Disp: , Rfl:  .  ondansetron (ZOFRAN) 4 MG tablet, Take 1 tablet (4 mg total) by mouth every 8 (eight) hours as needed for nausea or vomiting., Disp: 30 tablet, Rfl: 1 .  PARoxetine (PAXIL) 20 MG tablet, Take 20 mg by mouth daily., Disp: , Rfl:  .  pravastatin (PRAVACHOL) 20 MG tablet, Take 20 mg by mouth daily., Disp: , Rfl:  .  Probiotic Product (PROBIOTIC-10) CAPS, Take 1 capsule by mouth daily., Disp: , Rfl:  .  psyllium (METAMUCIL) 58.6 % packet, Take 1 packet by mouth daily as needed. , Disp: , Rfl:  .  raloxifene (EVISTA) 60 MG tablet, Take 60 mg by mouth daily., Disp: , Rfl:   Physical exam:  Vitals:   09/18/16 0857  BP: 117/75  Pulse: 61  Resp: 18  Temp: 98.4 F (36.9 C)  TempSrc: Tympanic  Weight: 127 lb 11.2 oz (57.9 kg)  Height: 5' 4"  (1.626 m)   Physical Exam  Constitutional: She is oriented to person, place, and time and well-developed,  well-nourished, and in no distress.  HENT:  Head: Normocephalic and atraumatic.  Mouth/Throat: Oropharynx is clear and moist.  Eyes: Pupils are equal, round, and reactive to light. EOM are normal.  Neck: Normal range of motion.  Cardiovascular: Normal rate, regular rhythm and normal heart sounds.   Pulmonary/Chest: Effort normal and breath sounds normal.  Abdominal: Soft. Bowel sounds are normal.  Neurological: She is alert and oriented to person, place, and time.  Skin: Skin is warm and dry.     CMP Latest Ref Rng & Units 09/18/2016  Glucose 65 - 99 mg/dL 111(H)  BUN 6 - 20 mg/dL 23(H)  Creatinine 0.44 - 1.00 mg/dL 0.86  Sodium 135 - 145 mmol/L 139  Potassium 3.5 - 5.1 mmol/L 4.1  Chloride 101 - 111 mmol/L 109  CO2 22 - 32 mmol/L 22  Calcium 8.9 - 10.3 mg/dL 8.7(L)  Total Protein 6.5 - 8.1 g/dL  6.8  Total Bilirubin 0.3 - 1.2 mg/dL 0.7  Alkaline Phos 38 - 126 U/L 108  AST 15 - 41 U/L 21  ALT 14 - 54 U/L 14   CBC Latest Ref Rng & Units 09/18/2016  WBC 3.6 - 11.0 K/uL 3.9  Hemoglobin 12.0 - 16.0 g/dL 11.6(L)  Hematocrit 35.0 - 47.0 % 32.6(L)  Platelets 150 - 440 K/uL 248      Assessment and plan- Patient is a 61 y.o. female with JAK2 + ET on hydrea.  Platelets at goal <400. Anemia is mild and stable. Macrocytosis secondary to hydrea  She is tolerating her hydrea well other than fatigue. Given the significant fatigue nd low normal wbc, will decrease hydrea dose to 1000 mg twice a week and 500 mg 5 times a week.   RTC in 3 months with cbc/ cmp. Interim cbc in 6 weeks   Visit Diagnosis 1. Essential thrombocythemia ([place])      Dr. Randa Evens, MD, MPH [facility] Pager- [phone] 09/18/2016 9:23 AM

## 2016-10-02 ENCOUNTER — Other Ambulatory Visit: Payer: Self-pay | Admitting: *Deleted

## 2016-10-02 DIAGNOSIS — D473 Essential (hemorrhagic) thrombocythemia: Secondary | ICD-10-CM

## 2016-10-02 MED ORDER — HYDROXYUREA 500 MG PO CAPS: ORAL_CAPSULE | ORAL | 0 refills | Status: DC

## 2016-10-29 ENCOUNTER — Inpatient Hospital Stay: Payer: BLUE CROSS/BLUE SHIELD | Attending: Oncology

## 2016-10-29 DIAGNOSIS — D473 Essential (hemorrhagic) thrombocythemia: Secondary | ICD-10-CM

## 2016-10-29 LAB — CBC WITH DIFFERENTIAL/PLATELET
BASOS ABS: 0 10*3/uL (ref 0–0.1)
Basophils Relative: 1 %
EOS ABS: 0 10*3/uL (ref 0–0.7)
EOS PCT: 1 %
HCT: 33.4 % — ABNORMAL LOW (ref 35.0–47.0)
Hemoglobin: 11.9 g/dL — ABNORMAL LOW (ref 12.0–16.0)
LYMPHS PCT: 31 %
Lymphs Abs: 1.4 10*3/uL (ref 1.0–3.6)
MCH: 40.6 pg — ABNORMAL HIGH (ref 26.0–34.0)
MCHC: 35.6 g/dL (ref 32.0–36.0)
MCV: 114.1 fL — ABNORMAL HIGH (ref 80.0–100.0)
Monocytes Absolute: 0.5 10*3/uL (ref 0.2–0.9)
Monocytes Relative: 11 %
Neutro Abs: 2.5 10*3/uL (ref 1.4–6.5)
Neutrophils Relative %: 56 %
PLATELETS: 279 10*3/uL (ref 150–440)
RBC: 2.92 MIL/uL — AB (ref 3.80–5.20)
RDW: 13.4 % (ref 11.5–14.5)
WBC: 4.5 10*3/uL (ref 3.6–11.0)

## 2016-10-30 ENCOUNTER — Other Ambulatory Visit: Payer: BLUE CROSS/BLUE SHIELD

## 2016-11-10 ENCOUNTER — Other Ambulatory Visit: Payer: Self-pay | Admitting: Oncology

## 2016-11-10 DIAGNOSIS — D473 Essential (hemorrhagic) thrombocythemia: Secondary | ICD-10-CM

## 2016-12-18 ENCOUNTER — Encounter: Payer: Self-pay | Admitting: Oncology

## 2016-12-18 ENCOUNTER — Inpatient Hospital Stay: Payer: BLUE CROSS/BLUE SHIELD | Attending: Oncology | Admitting: Oncology

## 2016-12-18 VITALS — BP 127/78 | HR 69 | Temp 97.8°F | Resp 18 | Wt 128.0 lb

## 2016-12-18 DIAGNOSIS — I341 Nonrheumatic mitral (valve) prolapse: Secondary | ICD-10-CM | POA: Diagnosis not present

## 2016-12-18 DIAGNOSIS — F329 Major depressive disorder, single episode, unspecified: Secondary | ICD-10-CM | POA: Insufficient documentation

## 2016-12-18 DIAGNOSIS — M3500 Sicca syndrome, unspecified: Secondary | ICD-10-CM | POA: Insufficient documentation

## 2016-12-18 DIAGNOSIS — Z9012 Acquired absence of left breast and nipple: Secondary | ICD-10-CM | POA: Diagnosis not present

## 2016-12-18 DIAGNOSIS — E0590 Thyrotoxicosis, unspecified without thyrotoxic crisis or storm: Secondary | ICD-10-CM

## 2016-12-18 DIAGNOSIS — Z87891 Personal history of nicotine dependence: Secondary | ICD-10-CM | POA: Insufficient documentation

## 2016-12-18 DIAGNOSIS — R5383 Other fatigue: Secondary | ICD-10-CM

## 2016-12-18 DIAGNOSIS — Z79899 Other long term (current) drug therapy: Secondary | ICD-10-CM

## 2016-12-18 DIAGNOSIS — D751 Secondary polycythemia: Secondary | ICD-10-CM | POA: Diagnosis present

## 2016-12-18 DIAGNOSIS — Z9221 Personal history of antineoplastic chemotherapy: Secondary | ICD-10-CM | POA: Diagnosis not present

## 2016-12-18 DIAGNOSIS — D473 Essential (hemorrhagic) thrombocythemia: Secondary | ICD-10-CM

## 2016-12-18 DIAGNOSIS — E559 Vitamin D deficiency, unspecified: Secondary | ICD-10-CM

## 2016-12-18 DIAGNOSIS — K449 Diaphragmatic hernia without obstruction or gangrene: Secondary | ICD-10-CM | POA: Insufficient documentation

## 2016-12-18 DIAGNOSIS — E785 Hyperlipidemia, unspecified: Secondary | ICD-10-CM | POA: Insufficient documentation

## 2016-12-18 DIAGNOSIS — Z9481 Bone marrow transplant status: Secondary | ICD-10-CM | POA: Diagnosis not present

## 2016-12-18 DIAGNOSIS — Z853 Personal history of malignant neoplasm of breast: Secondary | ICD-10-CM | POA: Insufficient documentation

## 2016-12-18 DIAGNOSIS — R0602 Shortness of breath: Secondary | ICD-10-CM | POA: Diagnosis not present

## 2016-12-18 DIAGNOSIS — K219 Gastro-esophageal reflux disease without esophagitis: Secondary | ICD-10-CM

## 2016-12-18 MED ORDER — HYDROXYUREA 500 MG PO CAPS: 500.0000 mg | ORAL_CAPSULE | Freq: Every day | ORAL | 1 refills | Status: DC

## 2016-12-18 NOTE — Progress Notes (Signed)
Hematology/Oncology Consult note [facility]  [phone] [phone]  Patient Care Team: Glendon Axe, MD as PCP - General (Internal Medicine)   Name of the patient: Karen [name]  [id number]  [birth year]   Date of visit: 12/18/16  Diagnosis- essential thrombocythemia JAK2 positive  Chief complaint/ Reason for visit- routine f/u of essential thrombocytosis  Heme/Onc history:patient is a 61 year old female with a history of Sjogren syndrome and autoimmune thyroid disorder. She also had left breast cancer at 61 years of age and underwent mastectomy as well as chemotherapy and autologus bone marrow transplant in 1994. She has a history of mitral valve prolapse. She has been referred to [place] for evaluation of thrombocytosis. She has a h/o hyperthyroidism for which she was on medication in the past. Recently she has been started on propranolol and is awaiting endocrinology referral.  Most recent CBC from 03/30/2016 showed white count of 7.5, H&H of 12.4/36.8 and a platelet count of 727. MCV was normal at 87.2. CMP was within normal limits except for potassium of 5.9. TSH was less than 0.01. Platelet count in December 2017 was 664 and in October 2017 it was 621. In November 2016 her platelet count was 654 and 738.   Results of bloodwork from 04/17/2016 were as follows: CBC showed white count of 8.6, H&H of 12.9/37.9 and a platelet count of 718. BCR able testing was negative. Jak 2 mutation testing was positive. CALR and MPL mutations were negative. Iron studies were within normal limits. ESR and CRP was normal.  Bone marrow biopsy from 05/12/2016 showed very little cellularity in the bone marrow. Increase in atypical megakaryocytes. No evidence of bone marrow fibrosis. Findings consistent with myeloproliferative neoplasm particularly essential thrombocythemia  Risks and benefits of Hydrea were discussed on 05/21/2016 and patient was started  on the same. 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 She feels wiped out by the end of the week. Denies other complaints  ECOG PS- 0 Pain scale- 0   Review of systems- Review of Systems  Constitutional: Positive for malaise/fatigue. Negative for chills, fever and weight loss.  HENT: Negative for congestion, ear discharge and nosebleeds.   Eyes: Negative for blurred vision.  Respiratory: Negative for cough, hemoptysis, sputum production, shortness of breath and wheezing.   Cardiovascular: Negative for chest pain, palpitations, orthopnea and claudication.  Gastrointestinal: Negative for abdominal pain, blood in stool, constipation, diarrhea, heartburn, melena, nausea and vomiting.  Genitourinary: Negative for dysuria, flank pain, frequency, hematuria and urgency.  Musculoskeletal: Negative for back pain, joint pain and myalgias.  Skin: Negative for rash.  Neurological: Negative for dizziness, tingling, focal weakness, seizures, weakness and headaches.  Endo/Heme/Allergies: Does not bruise/bleed easily.  Psychiatric/Behavioral: Negative for depression and suicidal ideas. The patient does not have insomnia.      No Known Allergies   Past Medical History:  Diagnosis Date  . Anemia   . Arthritis   . Breast cancer ([place]) 1995   left breast cancer - chemo and modified mastectomy  . Depression   . Dyspnea   . Essential thrombocytosis ([place])   . GERD (gastroesophageal reflux disease)   . History of hiatal hernia   . Hyperlipidemia   . Hyperthyroidism   . Mitral valve prolapse 1997  . Personal history of chemotherapy 1995  BREAST CA  . Personal history of radiation therapy 1995   BREAST CA  . Sjogren's syndrome ([place])   . Vitamin D deficiency      Past Surgical History:  Procedure Laterality Date  . AUGMENTATION  MAMMAPLASTY Bilateral   . BREAST LUMPECTOMY Left 1995  . MASTECTOMY Left 1995   Tram flap W/IMPLANT    Social History   Socioeconomic History  . Marital status: Divorced    Spouse name: Not on file  . Number of children: Not on file  . Years of education: Not on file  . Highest education level: Not on file  Social Needs  . Financial resource strain: Not on file  . Food insecurity - worry: Not on file  . Food insecurity - inability: Not on file  . Transportation needs - medical: Not on file  . Transportation needs - non-medical: Not on file  Occupational History  . Not on file  Tobacco Use  . Smoking status: Former Smoker    Packs/day: 0.50    Years: 48.00    Pack years: 24.00    Types: Cigarettes    Last attempt to quit: 02/09/2010    Years since quitting: 6.8  . Smokeless tobacco: Never Used  Substance and Sexual Activity  . Alcohol use: Yes    Comment: occ. wine maybe 1  month  . Drug use: No  . Sexual activity: Not on file  Other Topics Concern  . Not on file  Social History Narrative  . Not on file    Family History  Problem Relation Age of Onset  . Breast cancer Neg Hx      Current Outpatient Medications:  .  docusate sodium (COLACE) 100 MG capsule, Take 100 mg by mouth daily., Disp: , Rfl:  .  hydroxyurea (HYDREA) 500 MG capsule, Take 1 capsule (500 mg total) daily by mouth. May take with food to minimize GI side effects., Disp: 30 capsule, Rfl: 1 .  methimazole (TAPAZOLE) 10 MG tablet, Take 10 mg by mouth daily. , Disp: , Rfl:  .  Multiple Vitamin (MULTIVITAMIN) tablet, Take 1 tablet by mouth daily., Disp: , Rfl:  .  NON FORMULARY, Take 1 mL by mouth daily., Disp: , Rfl:  .  omega-3 acid ethyl esters (LOVAZA) 1 g capsule, Take 1 g by mouth daily., Disp: , Rfl:  .  PARoxetine (PAXIL) 20 MG tablet, Take 20 mg by mouth daily., Disp: , Rfl:  .  pravastatin (PRAVACHOL) 20 MG tablet, Take 20 mg by mouth daily., Disp: , Rfl:  .  psyllium (METAMUCIL) 58.6 %  packet, Take 1 packet by mouth daily as needed. , Disp: , Rfl:  .  raloxifene (EVISTA) 60 MG tablet, Take 60 mg by mouth daily., Disp: , Rfl:  .  ondansetron (ZOFRAN) 4 MG tablet, Take 1 tablet (4 mg total) by mouth every 8 (eight) hours as needed for nausea or vomiting. (Patient not taking: Reported on 12/18/2016), Disp: 30 tablet, Rfl: 1  Physical exam:  Vitals:   12/18/16 0948  BP: 127/78  Pulse: 69  Resp: 18  Temp: 97.8 F (36.6 C)  TempSrc: Tympanic  Weight: 128 lb (58.1 kg)   Physical Exam  Constitutional: She is oriented to person, place, and time and well-developed, well-nourished, and in no distress.  HENT:  Head: Normocephalic and atraumatic.  Eyes: EOM are normal. Pupils are equal, round, and reactive to light.  Neck: Normal range of motion.  Cardiovascular: Normal rate, regular rhythm and normal  heart sounds.  Pulmonary/Chest: Effort normal and breath sounds normal.  Abdominal: Soft. Bowel sounds are normal.  Neurological: She is alert and oriented to person, place, and time.  Skin: Skin is warm and dry.     CMP Latest Ref Rng & Units 09/18/2016  Glucose 65 - 99 mg/dL 111(H)  BUN 6 - 20 mg/dL 23(H)  Creatinine 0.44 - 1.00 mg/dL 0.86  Sodium 135 - 145 mmol/L 139  Potassium 3.5 - 5.1 mmol/L 4.1  Chloride 101 - 111 mmol/L 109  CO2 22 - 32 mmol/L 22  Calcium 8.9 - 10.3 mg/dL 8.7(L)  Total Protein 6.5 - 8.1 g/dL 6.8  Total Bilirubin 0.3 - 1.2 mg/dL 0.7  Alkaline Phos 38 - 126 U/L 108  AST 15 - 41 U/L 21  ALT 14 - 54 U/L 14   CBC Latest Ref Rng & Units 10/29/2016  WBC 3.6 - 11.0 K/uL 4.5  Hemoglobin 12.0 - 16.0 g/dL 11.9(L)  Hematocrit 35.0 - 47.0 % 33.4(L)  Platelets 150 - 440 K/uL 279      Assessment and plan- Patient is a 61 y.o. female with JAK2 positive essential thrombocytosis high risk due to age  Given her fatigue, we will decrease her hydrea dose to 500 mg once a day for all 7 days a week. Her cbc has been stable and her platelets have been in 200's.  So we have some lee way to bring her hydrea dose down and see if that improves her fatigue. Repeat cbc with diff in 1 month. Cbc with diff, cmp and see md in 3 months. Goal platelets < 400   Visit Diagnosis 1. High risk medication use   2. Essential thrombocytosis (HCC)      Dr. Randa Evens, MD, MPH [facility] at [facility] Pager- [phone] 12/18/2016 11:46 AM

## 2016-12-18 NOTE — Progress Notes (Signed)
Patient here for follow up with labs today. She states that she is feeling tired all the time and does not think that she can keep working full time. 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 She has trouble staying awake during the day.

## 2017-01-15 ENCOUNTER — Inpatient Hospital Stay: Payer: BLUE CROSS/BLUE SHIELD | Attending: Oncology

## 2017-01-15 DIAGNOSIS — D751 Secondary polycythemia: Secondary | ICD-10-CM | POA: Diagnosis present

## 2017-01-15 DIAGNOSIS — D473 Essential (hemorrhagic) thrombocythemia: Secondary | ICD-10-CM

## 2017-01-15 LAB — CBC WITH DIFFERENTIAL/PLATELET
Basophils Absolute: 0 10*3/uL (ref 0–0.1)
Basophils Relative: 1 %
EOS ABS: 0.1 10*3/uL (ref 0–0.7)
Eosinophils Relative: 1 %
HEMATOCRIT: 34.5 % — AB (ref 35.0–47.0)
Hemoglobin: 12 g/dL (ref 12.0–16.0)
LYMPHS ABS: 1.7 10*3/uL (ref 1.0–3.6)
Lymphocytes Relative: 30 %
MCH: 39.2 pg — AB (ref 26.0–34.0)
MCHC: 34.7 g/dL (ref 32.0–36.0)
MCV: 112.9 fL — ABNORMAL HIGH (ref 80.0–100.0)
MONO ABS: 0.5 10*3/uL (ref 0.2–0.9)
Monocytes Relative: 9 %
NEUTROS ABS: 3.4 10*3/uL (ref 1.4–6.5)
Neutrophils Relative %: 59 %
Platelets: 288 10*3/uL (ref 150–440)
RBC: 3.06 MIL/uL — ABNORMAL LOW (ref 3.80–5.20)
RDW: 12.6 % (ref 11.5–14.5)
WBC: 5.7 10*3/uL (ref 3.6–11.0)

## 2017-01-21 ENCOUNTER — Telehealth: Payer: Self-pay | Admitting: *Deleted

## 2017-01-21 DIAGNOSIS — D473 Essential (hemorrhagic) thrombocythemia: Secondary | ICD-10-CM

## 2017-01-21 MED ORDER — HYDROXYUREA 500 MG PO CAPS: 500.0000 mg | ORAL_CAPSULE | Freq: Every day | ORAL | 3 refills | Status: DC

## 2017-01-21 NOTE — Telephone Encounter (Signed)
Patient called requesting refill. Per Dr. Janese Banks refilled with 3 refills

## 2017-01-29 MED ORDER — HYDROXYUREA 500 MG PO CAPS: 500.0000 mg | ORAL_CAPSULE | Freq: Every day | ORAL | 3 refills | Status: DC

## 2017-01-29 NOTE — Addendum Note (Signed)
Addended by: Betti Cruz on: 01/29/2017 02:27 PM   Modules accepted: Orders

## 2017-03-08 ENCOUNTER — Other Ambulatory Visit: Payer: Self-pay | Admitting: Internal Medicine

## 2017-03-08 DIAGNOSIS — Z1231 Encounter for screening mammogram for malignant neoplasm of breast: Secondary | ICD-10-CM

## 2017-03-18 ENCOUNTER — Other Ambulatory Visit: Payer: Self-pay

## 2017-03-18 DIAGNOSIS — C50512 Malignant neoplasm of lower-outer quadrant of left female breast: Secondary | ICD-10-CM

## 2017-03-19 ENCOUNTER — Encounter: Payer: Self-pay | Admitting: Oncology

## 2017-03-19 ENCOUNTER — Ambulatory Visit
Admission: RE | Admit: 2017-03-19 | Discharge: 2017-03-19 | Disposition: A | Discharge status: Home / Self Care | Payer: BLUE CROSS/BLUE SHIELD | Source: Ambulatory Visit | Attending: Internal Medicine | Admitting: Internal Medicine

## 2017-03-19 ENCOUNTER — Inpatient Hospital Stay: Payer: BLUE CROSS/BLUE SHIELD | Attending: Oncology

## 2017-03-19 ENCOUNTER — Inpatient Hospital Stay (HOSPITAL_BASED_OUTPATIENT_CLINIC_OR_DEPARTMENT_OTHER): Payer: BLUE CROSS/BLUE SHIELD | Admitting: Oncology

## 2017-03-19 VITALS — BP 110/66 | HR 67 | Temp 97.7°F | Resp 16 | Wt 130.0 lb

## 2017-03-19 DIAGNOSIS — C50512 Malignant neoplasm of lower-outer quadrant of left female breast: Secondary | ICD-10-CM

## 2017-03-19 DIAGNOSIS — Z79899 Other long term (current) drug therapy: Secondary | ICD-10-CM

## 2017-03-19 DIAGNOSIS — Z1231 Encounter for screening mammogram for malignant neoplasm of breast: Secondary | ICD-10-CM | POA: Diagnosis present

## 2017-03-19 DIAGNOSIS — D473 Essential (hemorrhagic) thrombocythemia: Secondary | ICD-10-CM | POA: Diagnosis not present

## 2017-03-19 LAB — CBC WITH DIFFERENTIAL/PLATELET
Basophils Absolute: 0 10*3/uL (ref 0–0.1)
Basophils Relative: 1 %
EOS ABS: 0.1 10*3/uL (ref 0–0.7)
Eosinophils Relative: 2 %
HEMATOCRIT: 34.5 % — AB (ref 35.0–47.0)
HEMOGLOBIN: 11.8 g/dL — AB (ref 12.0–16.0)
LYMPHS ABS: 1.6 10*3/uL (ref 1.0–3.6)
Lymphocytes Relative: 29 %
MCH: 37.8 pg — AB (ref 26.0–34.0)
MCHC: 34.2 g/dL (ref 32.0–36.0)
MCV: 110.5 fL — ABNORMAL HIGH (ref 80.0–100.0)
MONO ABS: 0.4 10*3/uL (ref 0.2–0.9)
MONOS PCT: 7 %
NEUTROS ABS: 3.3 10*3/uL (ref 1.4–6.5)
NEUTROS PCT: 61 %
Platelets: 283 10*3/uL (ref 150–440)
RBC: 3.12 MIL/uL — ABNORMAL LOW (ref 3.80–5.20)
RDW: 12.4 % (ref 11.5–14.5)
WBC: 5.4 10*3/uL (ref 3.6–11.0)

## 2017-03-19 LAB — COMPREHENSIVE METABOLIC PANEL
ALBUMIN: 4 g/dL (ref 3.5–5.0)
ALK PHOS: 101 U/L (ref 38–126)
ALT: 18 U/L (ref 14–54)
AST: 26 U/L (ref 15–41)
Anion gap: 9 (ref 5–15)
BUN: 19 mg/dL (ref 6–20)
CHLORIDE: 109 mmol/L (ref 101–111)
CO2: 23 mmol/L (ref 22–32)
CREATININE: 0.81 mg/dL (ref 0.44–1.00)
Calcium: 8.8 mg/dL — ABNORMAL LOW (ref 8.9–10.3)
GFR calc non Af Amer: >60 mL/min (ref >60)
GLUCOSE: 115 mg/dL — AB (ref 65–99)
Potassium: 4.1 mmol/L (ref 3.5–5.1)
SODIUM: 141 mmol/L (ref 135–145)
Total Bilirubin: 0.5 mg/dL (ref 0.3–1.2)
Total Protein: 7.1 g/dL (ref 6.5–8.1)

## 2017-03-19 NOTE — Progress Notes (Signed)
Hematology/Oncology Consult note [facility]  [phone] [phone]  Patient Care Team: Glendon Axe, MD as PCP - General (Internal Medicine)   Name of the patient: Karen [name]  [id number]  [birth year]   Date of visit: 03/19/17  Diagnosis- essential thrombocythemia JAK2 positive  Chief complaint/ Reason for visit- routine f/u of essential thrombocytosis  Heme/Onc history:patient is a 62 year old female with a history of Sjogren syndrome and autoimmune thyroid disorder. She also had left breast cancer at 62 years of age and underwent mastectomy as well as chemotherapy and autologus bone marrow transplant in 1994. She has a history of mitral valve prolapse. She has been referred to [place] for evaluation of thrombocytosis. She has a h/o hyperthyroidism for which she was on medication in the past. Recently she has been started on propranolol and is awaiting endocrinology referral.  Most recent CBC from 03/30/2016 showed white count of 7.5, H&H of 12.4/36.8 and a platelet count of 727. MCV was normal at 87.2. CMP was within normal limits except for potassium of 5.9. TSH was less than 0.01. Platelet count in December 2017 was 664 and in October 2017 it was 621. In November 2016 her platelet count was 654 and 738.   Results of bloodwork from 04/17/2016 were as follows: CBC showed white count of 8.6, H&H of 12.9/37.9 and a platelet count of 718. BCR able testing was negative. Jak 2 mutation testing was positive. CALR and MPL mutations were negative. Iron studies were within normal limits. ESR and CRP was normal.  Bone marrow biopsy from 05/12/2016 showed very little cellularity in the bone marrow. Increase in atypical megakaryocytes. No evidence of bone marrow fibrosis. Findings consistent with myeloproliferative neoplasm particularly essential thrombocythemia  Risks and benefits of Hydrea were discussed on 05/21/2016 and patient was started  on the same. Currently on hydrea 500 mg daily   Interval history- tolerating hydrea well. Fatigue much improved after decreasing the dose. Denies any skin rash, mouth ulcers, diarrhea or other side effects  ECOG PS- 0 Pain scale- 0   Review of systems- Review of Systems  Constitutional: Negative for chills, fever, malaise/fatigue and weight loss.  HENT: Negative for congestion, ear discharge and nosebleeds.   Eyes: Negative for blurred vision.  Respiratory: Negative for cough, hemoptysis, sputum production, shortness of breath and wheezing.   Cardiovascular: Negative for chest pain, palpitations, orthopnea and claudication.  Gastrointestinal: Negative for abdominal pain, blood in stool, constipation, diarrhea, heartburn, melena, nausea and vomiting.  Genitourinary: Negative for dysuria, flank pain, frequency, hematuria and urgency.  Musculoskeletal: Negative for back pain, joint pain and myalgias.  Skin: Negative for rash.  Neurological: Negative for dizziness, tingling, focal weakness, seizures, weakness and headaches.  Endo/Heme/Allergies: Does not bruise/bleed easily.  Psychiatric/Behavioral: Negative for depression and suicidal ideas. The patient does not have insomnia.        No Known Allergies   Past Medical History:  Diagnosis Date  . Anemia   . Arthritis   . Breast cancer ([place]) 1995   left breast cancer - chemo and modified mastectomy  . Depression   . Dyspnea   . Essential thrombocytosis ([place])   . GERD (gastroesophageal reflux disease)   . History of hiatal hernia   . Hyperlipidemia   . Hyperthyroidism   . Mitral valve prolapse 1997  . Personal history of chemotherapy 1995   BREAST CA  . Personal history of radiation therapy 1995   BREAST CA  . Sjogren's syndrome ([place])   .  Vitamin D deficiency      Past Surgical History:  Procedure Laterality Date  . AUGMENTATION MAMMAPLASTY Bilateral   . BREAST LUMPECTOMY Left 1995  . MASTECTOMY Left 1995   Tram flap  W/IMPLANT    Social History   Socioeconomic History  . Marital status: Divorced    Spouse name: Not on file  . Number of children: Not on file  . Years of education: Not on file  . Highest education level: Not on file  Social Needs  . Financial resource strain: Not on file  . Food insecurity - worry: Not on file  . Food insecurity - inability: Not on file  . Transportation needs - medical: Not on file  . Transportation needs - non-medical: Not on file  Occupational History  . Not on file  Tobacco Use  . Smoking status: Former Smoker    Packs/day: 0.50    Years: 48.00    Pack years: 24.00    Types: Cigarettes    Last attempt to quit: 02/09/2010    Years since quitting: 7.1  . Smokeless tobacco: Never Used  Substance and Sexual Activity  . Alcohol use: Yes    Comment: occ. wine maybe 1  month  . Drug use: No  . Sexual activity: Not on file  Other Topics Concern  . Not on file  Social History Narrative  . Not on file    Family History  Problem Relation Age of Onset  . Breast cancer Neg Hx      Current Outpatient Medications:  .  docusate sodium (COLACE) 100 MG capsule, Take 100 mg by mouth daily., Disp: , Rfl:  .  hydroxyurea (HYDREA) 500 MG capsule, Take 1 capsule (500 mg total) by mouth daily. May take with food to minimize GI side effects., Disp: 30 capsule, Rfl: 3 .  methimazole (TAPAZOLE) 10 MG tablet, Take 10 mg by mouth daily. , Disp: , Rfl:  .  Multiple Vitamin (MULTIVITAMIN) tablet, Take 1 tablet by mouth daily., Disp: , Rfl:  .  NON FORMULARY, Take 1 mL by mouth daily., Disp: , Rfl:  .  omega-3 acid ethyl esters (LOVAZA) 1 g capsule, Take 1 g by mouth daily., Disp: , Rfl:  .  ondansetron (ZOFRAN) 4 MG tablet, Take 1 tablet (4 mg total) by mouth every 8 (eight) hours as needed for nausea or vomiting. (Patient not taking: Reported on 12/18/2016), Disp: 30 tablet, Rfl: 1 .  PARoxetine (PAXIL) 20 MG tablet, Take 20 mg by mouth daily., Disp: , Rfl:  .  pravastatin  (PRAVACHOL) 20 MG tablet, Take 20 mg by mouth daily., Disp: , Rfl:  .  psyllium (METAMUCIL) 58.6 % packet, Take 1 packet by mouth daily as needed. , Disp: , Rfl:  .  raloxifene (EVISTA) 60 MG tablet, Take 60 mg by mouth daily., Disp: , Rfl:   Physical exam:  Vitals:   03/19/17 0831  BP: 110/66  Pulse: 67  Resp: 16  Temp: 97.7 F (36.5 C)  TempSrc: Tympanic  Weight: 130 lb (59 kg)   Physical Exam  Constitutional: She is oriented to person, place, and time and well-developed, well-nourished, and in no distress.  HENT:  Head: Normocephalic and atraumatic.  Eyes: EOM are normal. Pupils are equal, round, and reactive to light.  Neck: Normal range of motion.  Cardiovascular: Normal rate, regular rhythm and normal heart sounds.  Pulmonary/Chest: Effort normal and breath sounds normal.  Abdominal: Soft. Bowel sounds are normal.  Neurological: She is alert  and oriented to person, place, and time.  Skin: Skin is warm and dry.     CMP Latest Ref Rng & Units 03/19/2017  Glucose 65 - 99 mg/dL 115(H)  BUN 6 - 20 mg/dL 19  Creatinine 0.44 - 1.00 mg/dL 0.81  Sodium 135 - 145 mmol/L 141  Potassium 3.5 - 5.1 mmol/L 4.1  Chloride 101 - 111 mmol/L 109  CO2 22 - 32 mmol/L 23  Calcium 8.9 - 10.3 mg/dL 8.8(L)  Total Protein 6.5 - 8.1 g/dL 7.1  Total Bilirubin 0.3 - 1.2 mg/dL 0.5  Alkaline Phos 38 - 126 U/L 101  AST 15 - 41 U/L 26  ALT 14 - 54 U/L 18   CBC Latest Ref Rng & Units 03/19/2017  WBC 3.6 - 11.0 K/uL 5.4  Hemoglobin 12.0 - 16.0 g/dL 11.8(L)  Hematocrit 35.0 - 47.0 % 34.5(L)  Platelets 150 - 440 K/uL 283      Assessment and plan- Patient is a 62 y.o. female with JAK2 positive essential thrombocytosis high risk due to age  Platelets remain at goal <400 with hydrea 500 mg po daiyl and she will continue that dose at this point  Repeat cbc with diff/ cmp in 3 and 6 months and I will see her back in 6 months. She will call us in the interim if questions or concerns arise.   Visit  Diagnosis 1. Essential thrombocythemia ([place])   2. High risk medication use      Dr. Randa Evens, MD, MPH [facility] at [facility] Pager- [phone] 03/19/2017 8:47 AM

## 2017-06-03 ENCOUNTER — Other Ambulatory Visit: Payer: Self-pay | Admitting: Oncology

## 2017-06-03 DIAGNOSIS — D473 Essential (hemorrhagic) thrombocythemia: Secondary | ICD-10-CM

## 2017-06-18 ENCOUNTER — Inpatient Hospital Stay: Payer: BLUE CROSS/BLUE SHIELD | Attending: Oncology

## 2017-06-18 DIAGNOSIS — D473 Essential (hemorrhagic) thrombocythemia: Secondary | ICD-10-CM | POA: Insufficient documentation

## 2017-06-18 LAB — CBC WITH DIFFERENTIAL/PLATELET
Basophils Absolute: 0 10*3/uL (ref 0–0.1)
Basophils Relative: 1 %
EOS PCT: 2 %
Eosinophils Absolute: 0.1 10*3/uL (ref 0–0.7)
HCT: 34.2 % — ABNORMAL LOW (ref 35.0–47.0)
HEMOGLOBIN: 11.9 g/dL — AB (ref 12.0–16.0)
LYMPHS ABS: 1.6 10*3/uL (ref 1.0–3.6)
LYMPHS PCT: 38 %
MCH: 37.6 pg — AB (ref 26.0–34.0)
MCHC: 34.7 g/dL (ref 32.0–36.0)
MCV: 108.4 fL — AB (ref 80.0–100.0)
MONOS PCT: 9 %
Monocytes Absolute: 0.4 10*3/uL (ref 0.2–0.9)
NEUTROS PCT: 50 %
Neutro Abs: 2.2 10*3/uL (ref 1.4–6.5)
Platelets: 285 10*3/uL (ref 150–440)
RBC: 3.15 MIL/uL — AB (ref 3.80–5.20)
RDW: 14.3 % (ref 11.5–14.5)
WBC: 4.3 10*3/uL (ref 3.6–11.0)

## 2017-06-18 LAB — COMPREHENSIVE METABOLIC PANEL
ALK PHOS: 101 U/L (ref 38–126)
ALT: 16 U/L (ref 14–54)
ANION GAP: 8 (ref 5–15)
AST: 22 U/L (ref 15–41)
Albumin: 3.8 g/dL (ref 3.5–5.0)
BUN: 21 mg/dL — AB (ref 6–20)
CO2: 24 mmol/L (ref 22–32)
Calcium: 8.8 mg/dL — ABNORMAL LOW (ref 8.9–10.3)
Chloride: 108 mmol/L (ref 101–111)
Creatinine, Ser: 0.83 mg/dL (ref 0.44–1.00)
GFR calc Af Amer: >60 mL/min (ref >60)
Glucose, Bld: 99 mg/dL (ref 65–99)
POTASSIUM: 4 mmol/L (ref 3.5–5.1)
Sodium: 140 mmol/L (ref 135–145)
Total Bilirubin: 0.7 mg/dL (ref 0.3–1.2)
Total Protein: 6.8 g/dL (ref 6.5–8.1)

## 2017-07-15 ENCOUNTER — Encounter: Payer: Self-pay | Admitting: *Deleted

## 2017-07-16 ENCOUNTER — Encounter: Payer: Self-pay | Admitting: Anesthesiology

## 2017-07-16 ENCOUNTER — Encounter
Admission: RE | Disposition: A | Discharge status: Home / Self Care | Payer: Self-pay | Source: Ambulatory Visit | Attending: Unknown Physician Specialty

## 2017-07-16 ENCOUNTER — Ambulatory Visit: Payer: BLUE CROSS/BLUE SHIELD | Admitting: Anesthesiology

## 2017-07-16 ENCOUNTER — Other Ambulatory Visit: Payer: Self-pay

## 2017-07-16 ENCOUNTER — Ambulatory Visit
Admission: RE | Admit: 2017-07-16 | Discharge: 2017-07-16 | Disposition: A | Discharge status: Home / Self Care | Payer: BLUE CROSS/BLUE SHIELD | Source: Ambulatory Visit | Attending: Unknown Physician Specialty | Admitting: Unknown Physician Specialty

## 2017-07-16 DIAGNOSIS — M810 Age-related osteoporosis without current pathological fracture: Secondary | ICD-10-CM | POA: Insufficient documentation

## 2017-07-16 DIAGNOSIS — E785 Hyperlipidemia, unspecified: Secondary | ICD-10-CM | POA: Insufficient documentation

## 2017-07-16 DIAGNOSIS — Z853 Personal history of malignant neoplasm of breast: Secondary | ICD-10-CM | POA: Insufficient documentation

## 2017-07-16 DIAGNOSIS — Z79899 Other long term (current) drug therapy: Secondary | ICD-10-CM | POA: Diagnosis not present

## 2017-07-16 DIAGNOSIS — Z1211 Encounter for screening for malignant neoplasm of colon: Secondary | ICD-10-CM | POA: Diagnosis present

## 2017-07-16 DIAGNOSIS — Z9221 Personal history of antineoplastic chemotherapy: Secondary | ICD-10-CM | POA: Insufficient documentation

## 2017-07-16 DIAGNOSIS — Z9012 Acquired absence of left breast and nipple: Secondary | ICD-10-CM | POA: Diagnosis not present

## 2017-07-16 DIAGNOSIS — I341 Nonrheumatic mitral (valve) prolapse: Secondary | ICD-10-CM | POA: Diagnosis not present

## 2017-07-16 DIAGNOSIS — K648 Other hemorrhoids: Secondary | ICD-10-CM | POA: Insufficient documentation

## 2017-07-16 DIAGNOSIS — Z7982 Long term (current) use of aspirin: Secondary | ICD-10-CM | POA: Diagnosis not present

## 2017-07-16 DIAGNOSIS — Z800 Family history of malignant neoplasm of digestive organs: Secondary | ICD-10-CM | POA: Diagnosis not present

## 2017-07-16 DIAGNOSIS — E0590 Thyrotoxicosis, unspecified without thyrotoxic crisis or storm: Secondary | ICD-10-CM | POA: Insufficient documentation

## 2017-07-16 DIAGNOSIS — Z87891 Personal history of nicotine dependence: Secondary | ICD-10-CM | POA: Diagnosis not present

## 2017-07-16 HISTORY — DX: Cardiac arrhythmia, unspecified: I49.9

## 2017-07-16 HISTORY — PX: COLONOSCOPY WITH PROPOFOL: SHX5780

## 2017-07-16 HISTORY — DX: Anxiety disorder, unspecified: F41.9

## 2017-07-16 HISTORY — DX: Cardiac murmur, unspecified: R01.1

## 2017-07-16 HISTORY — DX: Age-related osteoporosis without current pathological fracture: M81.0

## 2017-07-16 SURGERY — COLONOSCOPY WITH PROPOFOL
Anesthesia: General

## 2017-07-16 MED ORDER — LIDOCAINE 2% (20 MG/ML) 5 ML SYRINGE
INTRAMUSCULAR | Status: DC | PRN
  Administered 2017-07-16: 30 mg via INTRAVENOUS

## 2017-07-16 MED ORDER — PROPOFOL 10 MG/ML IV BOLUS
INTRAVENOUS | Status: AC
  Filled 2017-07-16: qty 20

## 2017-07-16 MED ORDER — LIDOCAINE HCL (PF) 1 % IJ SOLN
INTRAMUSCULAR | Status: AC
  Filled 2017-07-16: qty 2

## 2017-07-16 MED ORDER — LIDOCAINE HCL (PF) 2 % IJ SOLN
INTRAMUSCULAR | Status: AC
  Filled 2017-07-16: qty 10

## 2017-07-16 MED ORDER — PHENYLEPHRINE HCL 10 MG/ML IJ SOLN
INTRAMUSCULAR | Status: DC | PRN
  Administered 2017-07-16: 100 ug via INTRAVENOUS

## 2017-07-16 MED ORDER — SODIUM CHLORIDE 0.9 % IV SOLN: INTRAVENOUS | Status: DC

## 2017-07-16 MED ORDER — PROPOFOL 10 MG/ML IV BOLUS
INTRAVENOUS | Status: DC | PRN
  Administered 2017-07-16: 100 mg via INTRAVENOUS

## 2017-07-16 MED ORDER — SODIUM CHLORIDE 0.9 % IV SOLN
INTRAVENOUS | Status: DC
  Administered 2017-07-16: 09:00:00 via INTRAVENOUS

## 2017-07-16 MED ORDER — PROPOFOL 500 MG/50ML IV EMUL
INTRAVENOUS | Status: DC | PRN
  Administered 2017-07-16: 150 ug/kg/min via INTRAVENOUS

## 2017-07-16 MED ORDER — FENTANYL CITRATE (PF) 100 MCG/2ML IJ SOLN
INTRAMUSCULAR | Status: DC | PRN
  Administered 2017-07-16: 25 ug via INTRAVENOUS
  Administered 2017-07-16: 50 ug via INTRAVENOUS
  Administered 2017-07-16: 25 ug via INTRAVENOUS

## 2017-07-16 MED ORDER — EPHEDRINE SULFATE 50 MG/ML IJ SOLN
INTRAMUSCULAR | Status: DC | PRN
  Administered 2017-07-16: 10 mg via INTRAVENOUS

## 2017-07-16 MED ORDER — FENTANYL CITRATE (PF) 100 MCG/2ML IJ SOLN
INTRAMUSCULAR | Status: AC
  Filled 2017-07-16: qty 2

## 2017-07-16 MED ORDER — PROPOFOL 500 MG/50ML IV EMUL
INTRAVENOUS | Status: AC
  Filled 2017-07-16: qty 50

## 2017-07-16 NOTE — Anesthesia Preprocedure Evaluation (Addendum)
Anesthesia Evaluation  Patient identified by MRN, date of birth, ID band Patient awake    Reviewed: Allergy & Precautions, H&P , NPO status , Patient's Chart, lab work & pertinent test results, reviewed documented beta blocker date and time   Airway Mallampati: II  TM Distance: >3 FB Neck ROM: full    Dental  (+) Dental Advidsory Given, Partial Upper, Poor Dentition   Pulmonary shortness of breath and with exertion, neg COPD, neg recent URI, former smoker,           Cardiovascular Exercise Tolerance: Good (-) hypertension(-) angina(-) CAD, (-) Past MI, (-) Cardiac Stents and (-) CABG + dysrhythmias + Valvular Problems/Murmurs MVP      Neuro/Psych PSYCHIATRIC DISORDERS Anxiety Depression negative neurological ROS     GI/Hepatic Neg liver ROS, hiatal hernia, GERD  Controlled,  Endo/Other  neg diabetesHyperthyroidism   Renal/GU negative Renal ROS  negative genitourinary   Musculoskeletal   Abdominal   Peds  Hematology negative hematology ROS (+)   Anesthesia Other Findings Past Medical History: No date: Anemia No date: Anxiety No date: Arthritis 1995: Breast cancer ([place])     Comment:  left breast cancer - chemo and modified mastectomy No date: Depression No date: Dyspnea No date: Dysrhythmia No date: Essential thrombocytosis (HCC) No date: GERD (gastroesophageal reflux disease) No date: Heart murmur No date: History of hiatal hernia No date: Hyperlipidemia No date: Hyperthyroidism 1997: Mitral valve prolapse No date: Osteoporosis 1995: Personal history of chemotherapy     Comment:  BREAST CA 1995: Personal history of radiation therapy     Comment:  BREAST CA No date: Sjogren's syndrome ([place]) No date: Vitamin D deficiency   Reproductive/Obstetrics negative OB ROS                            Anesthesia Physical Anesthesia Plan  ASA: III  Anesthesia Plan: General   Post-op Pain  Management:    Induction: Intravenous  PONV Risk Score and Plan: 3 and Propofol infusion  Airway Management Planned: Nasal Cannula  Additional Equipment:   Intra-op Plan:   Post-operative Plan:   Informed Consent: I have reviewed the patients History and Physical, chart, labs and discussed the procedure including the risks, benefits and alternatives for the proposed anesthesia with the patient or authorized representative who has indicated his/her understanding and acceptance.   Dental Advisory Given  Plan Discussed with: Anesthesiologist, CRNA and Surgeon  Anesthesia Plan Comments:         Anesthesia Quick Evaluation

## 2017-07-16 NOTE — Anesthesia Post-op Follow-up Note (Signed)
Anesthesia QCDR form completed.        

## 2017-07-16 NOTE — H&P (Signed)
Primary Care Physician:  Glendon Axe, MD Primary Gastroenterologist:  Dr. Vira Agar  Pre-Procedure History & Physical: HPI:  Karen [name] is a 62 y.o. female is here for an colonoscopy.  Done for family history of colon cancer in her father.   Past Medical History:  Diagnosis Date  . Anemia   . Anxiety   . Arthritis   . Breast cancer ([place]) 1995   left breast cancer - chemo and modified mastectomy  . Depression   . Dyspnea   . Dysrhythmia   . Essential thrombocytosis ([place])   . GERD (gastroesophageal reflux disease)   . Heart murmur   . History of hiatal hernia   . Hyperlipidemia   . Hyperthyroidism   . Mitral valve prolapse 1997  . Osteoporosis   . Personal history of chemotherapy 1995   BREAST CA  . Personal history of radiation therapy 1995   BREAST CA  . Sjogren's syndrome ([place])   . Vitamin D deficiency     Past Surgical History:  Procedure Laterality Date  . AUGMENTATION MAMMAPLASTY Bilateral 1995  . BREAST LUMPECTOMY Left 1995  . MASTECTOMY Left 1995   Tram flap W/IMPLANT  . TONSILLECTOMY      Prior to Admission medications   Medication Sig Start Date End Date Taking? Authorizing Provider  aspirin EC 81 MG tablet Take 81 mg by mouth daily.   Yes [provider]  omeprazole (PRILOSEC) 20 MG capsule Take 20 mg by mouth daily.   Yes [provider]  ondansetron (ZOFRAN) 4 MG tablet Take 1 tablet (4 mg total) by mouth every 8 (eight) hours as needed for nausea or vomiting. 05/25/16  Yes Sindy Guadeloupe, MD  Probiotic Product (PROBIOTIC-10) CHEW Chew by mouth.   Yes [provider]  psyllium (METAMUCIL) 58.6 % packet Take 1 packet by mouth daily as needed.    Yes [provider]  docusate sodium (COLACE) 100 MG capsule Take 100 mg by mouth daily.    [provider]  hydroxyurea (HYDREA) 500 MG capsule TAKE 1 CAPSULE BY MOUTH DAILY. MAY TAKE WITH FOOD TO MINIMIZE GI SIDEEFFECTS 06/03/17   Sindy Guadeloupe, MD   methimazole (TAPAZOLE) 10 MG tablet Take 5 mg by mouth daily.  06/04/16 06/04/17  [provider]  Multiple Vitamin (MULTIVITAMIN) tablet Take 1 tablet by mouth daily.    [provider]  NON FORMULARY Take 1 mL by mouth daily.    [provider]  omega-3 acid ethyl esters (LOVAZA) 1 g capsule Take 1 g by mouth daily.    [provider]  PARoxetine (PAXIL) 20 MG tablet Take 20 mg by mouth daily.    [provider]  pravastatin (PRAVACHOL) 20 MG tablet Take 20 mg by mouth daily.    [provider]  raloxifene (EVISTA) 60 MG tablet Take 60 mg by mouth daily.    [provider]    Allergies as of 06/02/2017  . (No Known Allergies)    Family History  Problem Relation Age of Onset  . Breast cancer Neg Hx     Social History   Socioeconomic History  . Marital status: Divorced    Spouse name: Not on file  . Number of children: Not on file  . Years of education: Not on file  . Highest education level: Not on file  Occupational History  . Not on file  Social Needs  . Financial resource strain: Not on file  . Food insecurity:  Worry: Not on file    Inability: Not on file  . Transportation needs:    Medical: Not on file    Non-medical: Not on file  Tobacco Use  . Smoking status: Former Smoker    Packs/day: 0.50    Years: 48.00    Pack years: 24.00    Types: Cigarettes    Last attempt to quit: 02/09/2010    Years since quitting: 7.4  . Smokeless tobacco: Never Used  Substance and Sexual Activity  . Alcohol use: Yes    Comment: occ. wine maybe 1  month  . Drug use: No  . Sexual activity: Not on file  Lifestyle  . Physical activity:    Days per week: Not on file    Minutes per session: Not on file  . Stress: Not on file  Relationships  . Social connections:    Talks on phone: Not on file    Gets together: Not on file    Attends religious service: Not on file    Active member of club or organization: Not on  file    Attends meetings of clubs or organizations: Not on file    Relationship status: Not on file  . Intimate partner violence:    Fear of current or ex partner: Not on file    Emotionally abused: Not on file    Physically abused: Not on file    Forced sexual activity: Not on file  Other Topics Concern  . Not on file  Social History Narrative  . Not on file    Review of Systems: See HPI, otherwise negative ROS  Physical Exam: BP 122/72   Pulse 68   Temp (!) 97.3 F (36.3 C)   Resp 16   Ht 5\' 4"  (1.626 m)   Wt 58.1 kg (128 lb)   SpO2 100%   BMI 21.97 kg/m  General:   Alert,  pleasant and cooperative in NAD Head:  Normocephalic and atraumatic. Neck:  Supple; no masses or thyromegaly. Lungs:  Clear throughout to auscultation.    Heart:  Regular rate and rhythm. Abdomen:  Soft, nontender and nondistended. Normal bowel sounds, without guarding, and without rebound.   Neurologic:  Alert and  oriented x4;  grossly normal neurologically.  Impression/Plan: Karen [name] is here for an colonoscopy to be performed for FH colon cancer in her father.  Risks, benefits, limitations, and alternatives regarding  colonoscopy have been reviewed with the patient.  Questions have been answered.  All parties agreeable.   Gaylyn Cheers, MD  07/16/2017, 9:42 AM

## 2017-07-16 NOTE — Op Note (Signed)
[facility] Gastroenterology Patient Name: Karen [name] Procedure Date: 07/16/2017 9:36 AM MRN: [id number] Account #: [id number] Date of Birth: [birth year] Admit Type: Outpatient Age: 62 Room: [facility] ENDO ROOM 1 Gender: Female Note Status: Finalized Procedure:            Colonoscopy Indications:          Screening in patient at increased risk: Family history                        of 1st-degree relative with colorectal cancer Providers:            Manya Silvas, MD Referring MD:         Glendon Axe (Referring MD) Medicines:            Propofol per Anesthesia Complications:        No immediate complications. Procedure:            Pre-Anesthesia Assessment:                       - After reviewing the risks and benefits, the patient                        was deemed in satisfactory condition to undergo the                        procedure.                       After obtaining informed consent, the colonoscope was                        passed under direct vision. Throughout the procedure,                        the patient's blood pressure, pulse, and oxygen                        saturations were monitored continuously. The                        Colonoscope was introduced through the anus and                        advanced to the the cecum, identified by appendiceal                        orifice and ileocecal valve. The colonoscopy was                        performed without difficulty. The patient tolerated the                        procedure well. The quality of the bowel preparation                        was good. Findings:      Internal hemorrhoids were found during endoscopy. The hemorrhoids were       small and Grade I (internal hemorrhoids that do not prolapse). Prep a       little spotty in right proximal colon.      The exam  was otherwise without abnormality. Impression:           - Internal hemorrhoids.                       - The  examination was otherwise normal.                       - No specimens collected. Recommendation:       - Repeat colonoscopy in 5 years for screening purposes. Manya Silvas, MD 07/16/2017 10:08:10 AM This report has been signed electronically. Number of Addenda: 0 Note Initiated On: 07/16/2017 9:36 AM Scope Withdrawal Time: 0 hours 8 minutes 17 seconds  Total Procedure Duration: 0 hours 15 minutes 50 seconds       [facility]

## 2017-07-16 NOTE — Transfer of Care (Signed)
Immediate Anesthesia Transfer of Care Note  Patient: Karen [name]  Procedure(s) Performed: COLONOSCOPY WITH PROPOFOL (N/A )  Patient Location: PACU and Endoscopy Unit  Anesthesia Type:General  Level of Consciousness: drowsy  Airway & Oxygen Therapy: Patient Spontanous Breathing and Patient connected to nasal cannula oxygen  Post-op Assessment: Report given to RN and Post -op Vital signs reviewed and stable  Post vital signs: Reviewed and stable  Last Vitals:  Vitals Value Taken Time  BP 84/43 07/16/2017 10:13 AM  Temp    Pulse 87 07/16/2017 10:13 AM  Resp 10 07/16/2017 10:13 AM  SpO2 99 % 07/16/2017 10:13 AM  Vitals shown include unvalidated device data.  Last Pain: There were no vitals filed for this visit.       Complications: No apparent anesthesia complications

## 2017-07-19 ENCOUNTER — Encounter: Payer: Self-pay | Admitting: Unknown Physician Specialty

## 2017-07-19 NOTE — Anesthesia Postprocedure Evaluation (Signed)
Anesthesia Post Note  Patient: Karen [name]  Procedure(s) Performed: COLONOSCOPY WITH PROPOFOL (N/A )  Patient location during evaluation: Endoscopy Anesthesia Type: General Level of consciousness: awake and alert Pain management: pain level controlled Vital Signs Assessment: post-procedure vital signs reviewed and stable Respiratory status: spontaneous breathing, nonlabored ventilation, respiratory function stable and patient connected to nasal cannula oxygen Cardiovascular status: blood pressure returned to baseline and stable Postop Assessment: no apparent nausea or vomiting Anesthetic complications: no     Last Vitals:  Vitals:   07/16/17 1020 07/16/17 1030  BP: 114/64 110/62  Pulse: 97 89  Resp: 20 15  Temp:    SpO2: 100% 100%    Last Pain: There were no vitals filed for this visit.               Martha Clan

## 2017-09-17 ENCOUNTER — Inpatient Hospital Stay: Payer: BLUE CROSS/BLUE SHIELD | Admitting: Oncology

## 2017-09-17 ENCOUNTER — Inpatient Hospital Stay: Payer: BLUE CROSS/BLUE SHIELD

## 2017-09-24 ENCOUNTER — Encounter: Payer: Self-pay | Admitting: Oncology

## 2017-09-24 ENCOUNTER — Inpatient Hospital Stay: Payer: BLUE CROSS/BLUE SHIELD | Attending: Oncology

## 2017-09-24 ENCOUNTER — Inpatient Hospital Stay (HOSPITAL_BASED_OUTPATIENT_CLINIC_OR_DEPARTMENT_OTHER): Payer: BLUE CROSS/BLUE SHIELD | Admitting: Oncology

## 2017-09-24 VITALS — BP 114/77 | HR 61 | Temp 97.1°F | Resp 18 | Ht 64.0 in | Wt 127.5 lb

## 2017-09-24 DIAGNOSIS — D473 Essential (hemorrhagic) thrombocythemia: Secondary | ICD-10-CM

## 2017-09-24 DIAGNOSIS — Z853 Personal history of malignant neoplasm of breast: Secondary | ICD-10-CM

## 2017-09-24 DIAGNOSIS — Z87891 Personal history of nicotine dependence: Secondary | ICD-10-CM

## 2017-09-24 DIAGNOSIS — R5383 Other fatigue: Secondary | ICD-10-CM

## 2017-09-24 DIAGNOSIS — Z79899 Other long term (current) drug therapy: Secondary | ICD-10-CM | POA: Diagnosis not present

## 2017-09-24 LAB — CBC WITH DIFFERENTIAL/PLATELET
Basophils Absolute: 0.1 10*3/uL (ref 0–0.1)
Basophils Relative: 1 %
Eosinophils Absolute: 0.1 10*3/uL (ref 0–0.7)
Eosinophils Relative: 1 %
HCT: 35.1 % (ref 35.0–47.0)
HEMOGLOBIN: 12.1 g/dL (ref 12.0–16.0)
LYMPHS PCT: 31 %
Lymphs Abs: 1.5 10*3/uL (ref 1.0–3.6)
MCH: 37.4 pg — AB (ref 26.0–34.0)
MCHC: 34.6 g/dL (ref 32.0–36.0)
MCV: 108.3 fL — AB (ref 80.0–100.0)
MONOS PCT: 10 %
Monocytes Absolute: 0.5 10*3/uL (ref 0.2–0.9)
NEUTROS PCT: 57 %
Neutro Abs: 2.8 10*3/uL (ref 1.4–6.5)
Platelets: 314 10*3/uL (ref 150–440)
RBC: 3.24 MIL/uL — AB (ref 3.80–5.20)
RDW: 13.6 % (ref 11.5–14.5)
WBC: 5 10*3/uL (ref 3.6–11.0)

## 2017-09-24 LAB — COMPREHENSIVE METABOLIC PANEL
ALT: 15 U/L (ref 0–44)
AST: 20 U/L (ref 15–41)
Albumin: 3.9 g/dL (ref 3.5–5.0)
Alkaline Phosphatase: 102 U/L (ref 38–126)
Anion gap: 8 (ref 5–15)
BUN: 17 mg/dL (ref 8–23)
CHLORIDE: 110 mmol/L (ref 98–111)
CO2: 22 mmol/L (ref 22–32)
Calcium: 8.7 mg/dL — ABNORMAL LOW (ref 8.9–10.3)
Creatinine, Ser: 0.7 mg/dL (ref 0.44–1.00)
Glucose, Bld: 99 mg/dL (ref 70–99)
POTASSIUM: 4.1 mmol/L (ref 3.5–5.1)
SODIUM: 140 mmol/L (ref 135–145)
Total Bilirubin: 0.5 mg/dL (ref 0.3–1.2)
Total Protein: 7 g/dL (ref 6.5–8.1)

## 2017-09-24 MED ORDER — HYDROXYUREA 500 MG PO CAPS
500.0000 mg | ORAL_CAPSULE | Freq: Every day | ORAL | 3 refills | Status: DC
Start: 2017-09-24 — End: 2018-01-11

## 2017-09-24 NOTE — Progress Notes (Signed)
No new changes noted today 

## 2017-09-27 NOTE — Progress Notes (Signed)
Hematology/Oncology Consult note [facility]  Telephone:([phone] [phone]  Patient Care Team: Glendon Axe, MD as PCP - General (Internal Medicine)   Name of the patient: Karen [name]  [id number]  07/15/1955   Date of visit: 09/27/17  Diagnosis- essential thrombocythemia JAK2 positive  Chief complaint/ Reason for visit-routine follow-up of essential thrombocytosis  Heme/Onc history: patient is a 62 year old female with a history of Sjogren syndrome and autoimmune thyroid disorder. She also had left breast cancer at 62 years of age and underwent mastectomy as well as chemotherapy and autologus bone marrow transplant in 1994. She has a history of mitral valve prolapse. She has been referred to [place] for evaluation of thrombocytosis. She has a h/o hyperthyroidism for which she was on medication in the past. Recently she has been started on propranolol and is awaiting endocrinology referral.  Most recent CBC from 03/30/2016 showed white count of 7.5, H&H of 12.4/36.8 and a platelet count of 727. MCV was normal at 87.2. CMP was within normal limits except for potassium of 5.9. TSH was less than 0.01. Platelet count in December 2017 was 664 and in October 2017 it was 621. In November 2016 her platelet count was 654 and 738.   Results of bloodwork from 04/17/2016 were as follows: CBC showed white count of 8.6, H&H of 12.9/37.9 and a platelet count of 718. BCR able testing was negative. Jak 2 mutation testing was positive. CALR and MPL mutations were negative. Iron studies were within normal limits. ESR and CRP was normal.  Bone marrow biopsy from 05/12/2016 showed very little cellularity in the bone marrow. Increase in atypical megakaryocytes. No evidence of bone marrow fibrosis. Findings consistent with myeloproliferative neoplasm particularly essential thrombocythemia  Risks and benefits of Hydrea were discussed on 05/21/2016 and patient was  started on the same. Currently on hydrea 500 mg daily  Interval history-she is tolerating Hydrea well except for mild fatigue.  It does not interfere with her day-to-day quality of life.  Denies any skin rash diarrhea.  ECOG PS- 1 Pain scale- 0   Review of systems- Review of Systems  Constitutional: Positive for malaise/fatigue. Negative for chills, fever and weight loss.  HENT: Negative for congestion, ear discharge and nosebleeds.   Eyes: Negative for blurred vision.  Respiratory: Negative for cough, hemoptysis, sputum production, shortness of breath and wheezing.   Cardiovascular: Negative for chest pain, palpitations, orthopnea and claudication.  Gastrointestinal: Negative for abdominal pain, blood in stool, constipation, diarrhea, heartburn, melena, nausea and vomiting.  Genitourinary: Negative for dysuria, flank pain, frequency, hematuria and urgency.  Musculoskeletal: Negative for back pain, joint pain and myalgias.  Skin: Negative for rash.  Neurological: Negative for dizziness, tingling, focal weakness, seizures, weakness and headaches.  Endo/Heme/Allergies: Does not bruise/bleed easily.  Psychiatric/Behavioral: Negative for depression and suicidal ideas. The patient does not have insomnia.       No Known Allergies   Past Medical History:  Diagnosis Date  . Anemia   . Anxiety   . Arthritis   . Breast cancer ([place]) 1995   left breast cancer - chemo and modified mastectomy  . Depression   . Dyspnea   . Dysrhythmia   . Essential thrombocytosis ([place])   . GERD (gastroesophageal reflux disease)   . Heart murmur   . History of hiatal hernia   . Hyperlipidemia   . Hyperthyroidism   . Mitral valve prolapse 1997  . Osteoporosis   . Personal history of chemotherapy 1995  BREAST CA  . Personal history of radiation therapy 1995   BREAST CA  . Sjogren's syndrome ([place])   . Vitamin D deficiency      Past Surgical History:  Procedure Laterality Date  . AUGMENTATION  MAMMAPLASTY Bilateral 1995  . BREAST LUMPECTOMY Left 1995  . COLONOSCOPY WITH PROPOFOL N/A 07/16/2017   Procedure: COLONOSCOPY WITH PROPOFOL;  Surgeon: Manya Silvas, MD;  [facility] ENDOSCOPY;  Service: Endoscopy;  Laterality: N/A;  . MASTECTOMY Left 1995   Tram flap W/IMPLANT  . TONSILLECTOMY      Social History   Socioeconomic History  . Marital status: Divorced    Spouse name: Not on file  . Number of children: Not on file  . Years of education: Not on file  . Highest education level: Not on file  Occupational History  . Not on file  Social Needs  . Financial resource strain: Not on file  . Food insecurity:    Worry: Not on file    Inability: Not on file  . Transportation needs:    Medical: Not on file    Non-medical: Not on file  Tobacco Use  . Smoking status: Former Smoker    Packs/day: 0.50    Years: 48.00    Pack years: 24.00    Types: Cigarettes    Last attempt to quit: 02/09/2010    Years since quitting: 7.6  . Smokeless tobacco: Never Used  Substance and Sexual Activity  . Alcohol use: Yes    Comment: occ. wine maybe 1  month  . Drug use: No  . Sexual activity: Not on file  Lifestyle  . Physical activity:    Days per week: Not on file    Minutes per session: Not on file  . Stress: Not on file  Relationships  . Social connections:    Talks on phone: Not on file    Gets together: Not on file    Attends religious service: Not on file    Active member of club or organization: Not on file    Attends meetings of clubs or organizations: Not on file    Relationship status: Not on file  . Intimate partner violence:    Fear of current or ex partner: Not on file    Emotionally abused: Not on file    Physically abused: Not on file    Forced sexual activity: Not on file  Other Topics Concern  . Not on file  Social History Narrative  . Not on file    Family History  Problem Relation Age of Onset  . Breast cancer Neg Hx      Current Outpatient  Medications:  .  aspirin EC 81 MG tablet, Take 81 mg by mouth daily., Disp: , Rfl:  .  docusate sodium (COLACE) 100 MG capsule, Take 100 mg by mouth daily., Disp: , Rfl:  .  hydroxyurea (HYDREA) 500 MG capsule, Take 1 capsule (500 mg total) by mouth daily. May take with food to minimize GI side effects., Disp: 30 capsule, Rfl: 3 .  Multiple Vitamin (MULTIVITAMIN) tablet, Take 1 tablet by mouth daily., Disp: , Rfl:  .  NON FORMULARY, Take 1 mL by mouth daily., Disp: , Rfl:  .  omega-3 acid ethyl esters (LOVAZA) 1 g capsule, Take 1 g by mouth daily., Disp: , Rfl:  .  omeprazole (PRILOSEC) 20 MG capsule, Take 20 mg by mouth daily., Disp: , Rfl:  .  PARoxetine (PAXIL) 20 MG tablet, Take 20  mg by mouth daily., Disp: , Rfl:  .  pravastatin (PRAVACHOL) 20 MG tablet, Take 20 mg by mouth daily., Disp: , Rfl:  .  Probiotic Product (PROBIOTIC-10) CHEW, Chew by mouth., Disp: , Rfl:  .  psyllium (METAMUCIL) 58.6 % packet, Take 1 packet by mouth daily as needed. , Disp: , Rfl:  .  raloxifene (EVISTA) 60 MG tablet, Take 60 mg by mouth daily., Disp: , Rfl:  .  methimazole (TAPAZOLE) 10 MG tablet, Take 5 mg by mouth daily. , Disp: , Rfl:  .  ondansetron (ZOFRAN) 4 MG tablet, Take 1 tablet (4 mg total) by mouth every 8 (eight) hours as needed for nausea or vomiting. (Patient not taking: Reported on 09/24/2017), Disp: 30 tablet, Rfl: 1  Physical exam:  Vitals:   09/24/17 1020  BP: 114/77  Pulse: 61  Resp: 18  Temp: (!) 97.1 F (36.2 C)  TempSrc: Tympanic  SpO2: 97%  Weight: 127 lb 8 oz (57.8 kg)  Height: _0  (1.626 m)   Physical Exam  Constitutional: She is oriented to person, place, and time.  Thin lady in no acute distress  HENT:  Head: Normocephalic and atraumatic.  Eyes: Pupils are equal, round, and reactive to light. EOM are normal.  Neck: Normal range of motion.  Cardiovascular: Normal rate, regular rhythm and normal heart sounds.  Pulmonary/Chest: Effort normal and breath sounds normal.    Abdominal: Soft. Bowel sounds are normal. She exhibits no distension. There is no tenderness.  No palpable splenomegaly  Neurological: She is alert and oriented to person, place, and time.  Skin: Skin is warm and dry.     CMP Latest Ref Rng & Units 09/24/2017  Glucose 70 - 99 mg/dL 99  BUN 8 - 23 mg/dL 17  Creatinine 0.44 - 1.00 mg/dL 0.70  Sodium 135 - 145 mmol/L 140  Potassium 3.5 - 5.1 mmol/L 4.1  Chloride 98 - 111 mmol/L 110  CO2 22 - 32 mmol/L 22  Calcium 8.9 - 10.3 mg/dL 8.7(L)  Total Protein 6.5 - 8.1 g/dL 7.0  Total Bilirubin 0.3 - 1.2 mg/dL 0.5  Alkaline Phos 38 - 126 U/L 102  AST 15 - 41 U/L 20  ALT 0 - 44 U/L 15   CBC Latest Ref Rng & Units 09/24/2017  WBC 3.6 - 11.0 K/uL 5.0  Hemoglobin 12.0 - 16.0 g/dL 12.1  Hematocrit 35.0 - 47.0 % 35.1  Platelets 150 - 440 K/uL 314     Assessment and plan- Patient is a 62 y.o. female with high risk Jak 2+ essential thrombocytosis due to age  Patient will continue Hydrea at current dose of 500 mg daily.  Her platelets are at goal less than 400.  She is otherwise tolerating Hydrea well without any significant side effects.  I will see her back in 6 months with CBC with differential and CMP   Visit Diagnosis 1. Essential thrombocythemia ([place])   2. High risk medication use      Dr. Randa Evens, MD, MPH [facility] [phone] 09/27/2017 12:23 PM

## 2017-12-24 ENCOUNTER — Other Ambulatory Visit: Payer: BLUE CROSS/BLUE SHIELD

## 2017-12-28 ENCOUNTER — Inpatient Hospital Stay: Payer: BLUE CROSS/BLUE SHIELD | Attending: Oncology

## 2017-12-28 DIAGNOSIS — D473 Essential (hemorrhagic) thrombocythemia: Secondary | ICD-10-CM | POA: Diagnosis not present

## 2017-12-28 LAB — CBC WITH DIFFERENTIAL/PLATELET
Abs Immature Granulocytes: 0.02 10*3/uL (ref 0.00–0.07)
BASOS ABS: 0.1 10*3/uL (ref 0.0–0.1)
BASOS PCT: 1 %
EOS ABS: 0.1 10*3/uL (ref 0.0–0.5)
Eosinophils Relative: 1 %
HCT: 33.9 % — ABNORMAL LOW (ref 36.0–46.0)
Hemoglobin: 11.5 g/dL — ABNORMAL LOW (ref 12.0–15.0)
IMMATURE GRANULOCYTES: 0 %
Lymphocytes Relative: 29 %
Lymphs Abs: 2 10*3/uL (ref 0.7–4.0)
MCH: 35.3 pg — AB (ref 26.0–34.0)
MCHC: 33.9 g/dL (ref 30.0–36.0)
MCV: 104 fL — AB (ref 80.0–100.0)
Monocytes Absolute: 0.6 10*3/uL (ref 0.1–1.0)
Monocytes Relative: 9 %
NEUTROS PCT: 60 %
NRBC: 0 % (ref 0.0–0.2)
Neutro Abs: 4.2 10*3/uL (ref 1.7–7.7)
Platelets: 305 10*3/uL (ref 150–400)
RBC: 3.26 MIL/uL — ABNORMAL LOW (ref 3.87–5.11)
RDW: 12.8 % (ref 11.5–15.5)
WBC: 7 10*3/uL (ref 4.0–10.5)

## 2017-12-28 LAB — COMPREHENSIVE METABOLIC PANEL
ALK PHOS: 94 U/L (ref 38–126)
ALT: 15 U/L (ref 0–44)
ANION GAP: 7 (ref 5–15)
AST: 19 U/L (ref 15–41)
Albumin: 3.8 g/dL (ref 3.5–5.0)
BILIRUBIN TOTAL: 0.4 mg/dL (ref 0.3–1.2)
BUN: 20 mg/dL (ref 8–23)
CALCIUM: 8.8 mg/dL — AB (ref 8.9–10.3)
CO2: 24 mmol/L (ref 22–32)
Chloride: 109 mmol/L (ref 98–111)
Creatinine, Ser: 0.8 mg/dL (ref 0.44–1.00)
GFR calc Af Amer: >60 mL/min (ref >60)
GFR calc non Af Amer: >60 mL/min (ref >60)
Glucose, Bld: 111 mg/dL — ABNORMAL HIGH (ref 70–99)
POTASSIUM: 4 mmol/L (ref 3.5–5.1)
Sodium: 140 mmol/L (ref 135–145)
TOTAL PROTEIN: 6.8 g/dL (ref 6.5–8.1)

## 2018-01-11 ENCOUNTER — Other Ambulatory Visit: Payer: Self-pay | Admitting: *Deleted

## 2018-01-11 DIAGNOSIS — Z79899 Other long term (current) drug therapy: Secondary | ICD-10-CM

## 2018-01-11 MED ORDER — ONDANSETRON HCL 4 MG PO TABS: 4.0000 mg | ORAL_TABLET | Freq: Three times a day (TID) | ORAL | 0 refills | Status: DC | PRN

## 2018-01-11 MED ORDER — HYDROXYUREA 500 MG PO CAPS: 500.0000 mg | ORAL_CAPSULE | Freq: Every day | ORAL | 1 refills | Status: DC

## 2018-01-11 NOTE — Telephone Encounter (Signed)
Ok to refill for 6 months 

## 2018-01-11 NOTE — Telephone Encounter (Signed)
Patient states she needs refill on 2 meds and that she would like a 3 - 6 month supply as she is going to have to find another doc due to insurance and is not being able to afford payments here to see Dr Janese Banks any longer. Please advise

## 2018-02-22 ENCOUNTER — Ambulatory Visit: Admit: 2018-02-22 | Discharge: 2018-02-23 | Payer: MEDICARE

## 2018-02-22 DIAGNOSIS — F419 Anxiety disorder, unspecified: Principal | ICD-10-CM

## 2018-02-22 DIAGNOSIS — F329 Major depressive disorder, single episode, unspecified: Secondary | ICD-10-CM

## 2018-02-22 DIAGNOSIS — M3500 Sicca syndrome, unspecified: Secondary | ICD-10-CM

## 2018-02-22 DIAGNOSIS — E782 Mixed hyperlipidemia: Secondary | ICD-10-CM

## 2018-02-22 DIAGNOSIS — E063 Autoimmune thyroiditis: Secondary | ICD-10-CM

## 2018-02-22 DIAGNOSIS — D473 Essential (hemorrhagic) thrombocythemia: Secondary | ICD-10-CM

## 2018-02-22 DIAGNOSIS — F325 Major depressive disorder, single episode, in full remission: Secondary | ICD-10-CM

## 2018-02-22 DIAGNOSIS — Z853 Personal history of malignant neoplasm of breast: Secondary | ICD-10-CM

## 2018-02-22 DIAGNOSIS — K219 Gastro-esophageal reflux disease without esophagitis: Secondary | ICD-10-CM

## 2018-02-22 MED ORDER — RALOXIFENE 60 MG TABLET
ORAL_TABLET | Freq: Every day | ORAL | 0 refills | 0 days | Status: CP
Start: 2018-02-22 — End: 2018-04-21

## 2018-02-22 MED ORDER — PRAVASTATIN 20 MG TABLET
ORAL_TABLET | Freq: Every day | ORAL | 0 refills | 0 days | Status: CP
Start: 2018-02-22 — End: 2018-06-03

## 2018-02-22 MED ORDER — OMEPRAZOLE 20 MG CAPSULE,DELAYED RELEASE
ORAL_CAPSULE | Freq: Every day | ORAL | 2 refills | 0.00000 days | Status: CP
Start: 2018-02-22 — End: 2018-06-16

## 2018-02-22 MED ORDER — HYDROXYUREA 500 MG CAPSULE
ORAL_CAPSULE | Freq: Every day | ORAL | 3 refills | 0 days | Status: CP
Start: 2018-02-22 — End: 2018-06-16

## 2018-03-25 ENCOUNTER — Inpatient Hospital Stay: Payer: BLUE CROSS/BLUE SHIELD

## 2018-03-25 ENCOUNTER — Inpatient Hospital Stay: Payer: BLUE CROSS/BLUE SHIELD | Admitting: Oncology

## 2018-03-29 ENCOUNTER — Ambulatory Visit: Admit: 2018-03-29 | Discharge: 2018-03-30 | Payer: MEDICARE

## 2018-04-04 ENCOUNTER — Ambulatory Visit: Admit: 2018-04-04 | Discharge: 2018-04-04 | Payer: MEDICARE

## 2018-04-04 DIAGNOSIS — Z853 Personal history of malignant neoplasm of breast: Principal | ICD-10-CM

## 2018-04-18 DIAGNOSIS — D473 Essential (hemorrhagic) thrombocythemia: Principal | ICD-10-CM

## 2018-04-18 DIAGNOSIS — E063 Autoimmune thyroiditis: Principal | ICD-10-CM

## 2018-04-19 DIAGNOSIS — Z1231 Encounter for screening mammogram for malignant neoplasm of breast: Principal | ICD-10-CM

## 2018-04-20 ENCOUNTER — Ambulatory Visit: Admit: 2018-04-20 | Discharge: 2018-04-21 | Payer: MEDICARE

## 2018-04-20 DIAGNOSIS — E063 Autoimmune thyroiditis: Principal | ICD-10-CM

## 2018-04-20 DIAGNOSIS — D473 Essential (hemorrhagic) thrombocythemia: Principal | ICD-10-CM

## 2018-04-21 DIAGNOSIS — Z853 Personal history of malignant neoplasm of breast: Principal | ICD-10-CM

## 2018-04-21 MED ORDER — RALOXIFENE 60 MG TABLET
ORAL_TABLET | Freq: Every day | ORAL | 3 refills | 0.00000 days | Status: CP
Start: 2018-04-21 — End: ?

## 2018-05-15 IMAGING — CT CT BIOPSY AND ASPIRATION BONE MARROW
1 of 2 series · 8 of 14 positions shown, 10 images · non-contrast
Comparison: none

CLINICAL DATA: Thrombocytosis and history of breast carcinoma. Need
for bone marrow biopsy.

[Series 2: i-spiral 5.0 b30f · axial · 0.75mm/px · z∈[-184,-104]mm · 8 of 31 slices shown, 10 images]
[im 4/31  soft-tissue]
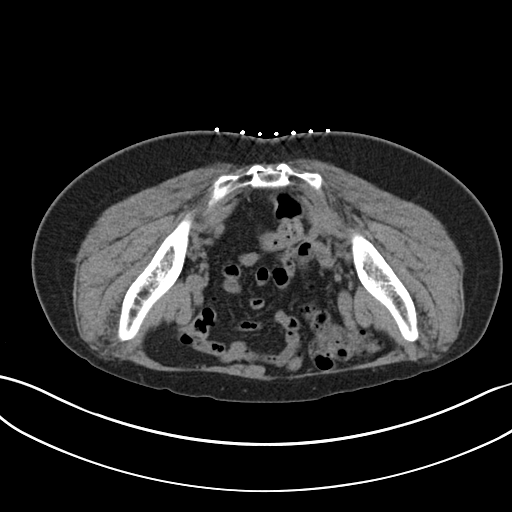
[im 4/31  bone]
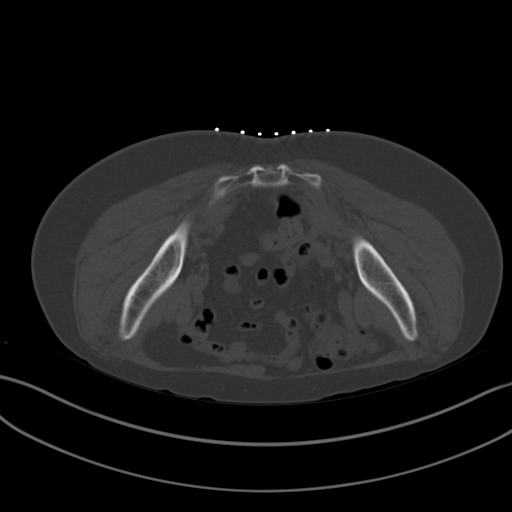
[im 7/31  bone]
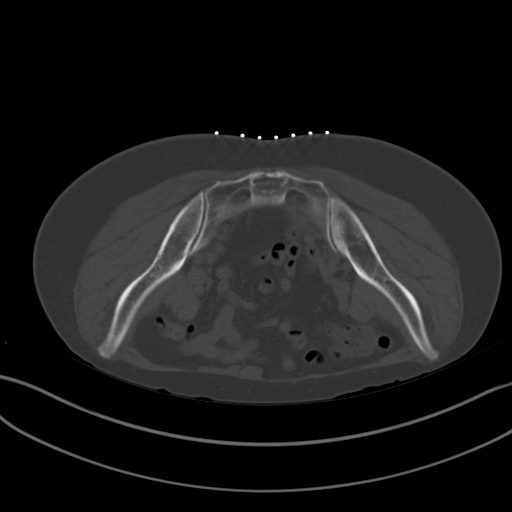
[im 11/31  bone]
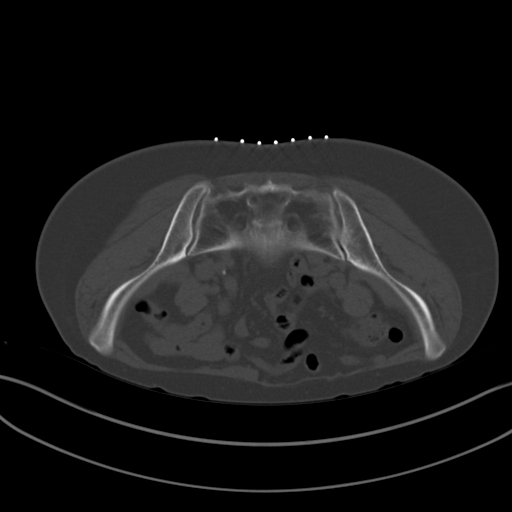
[im 14/31  bone]
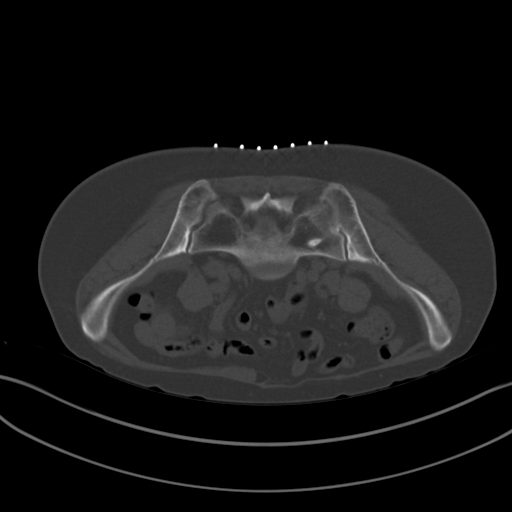
[im 17/31  soft-tissue]
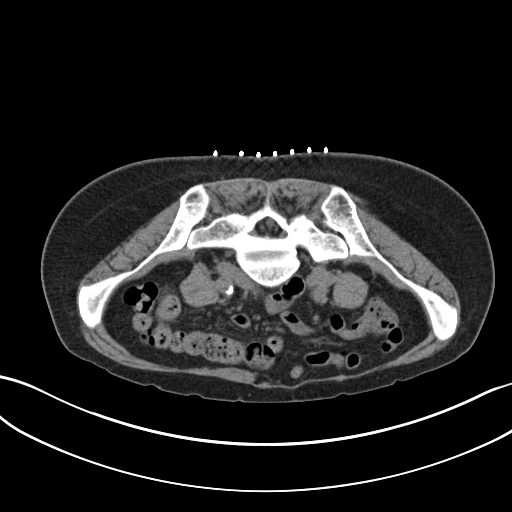
[im 17/31  bone]
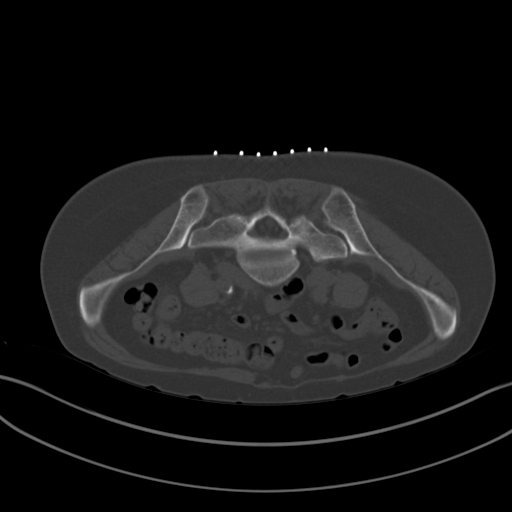
[im 21/31  bone]
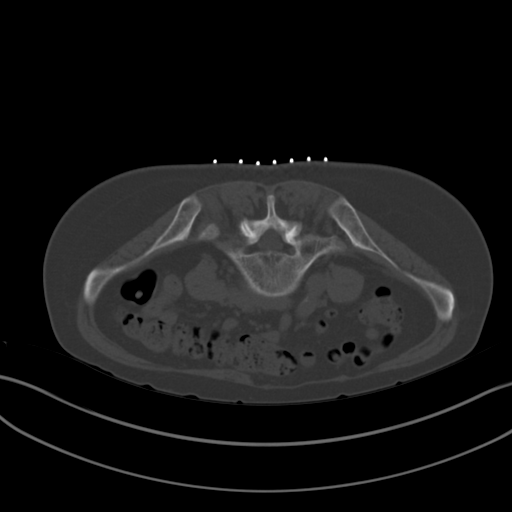
[im 24/31  bone]
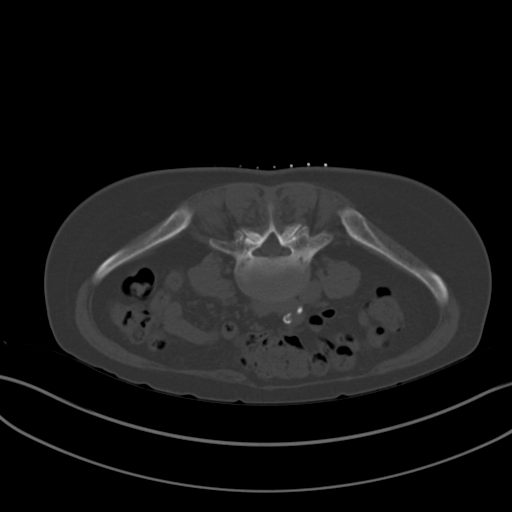
[im 27/31  bone]
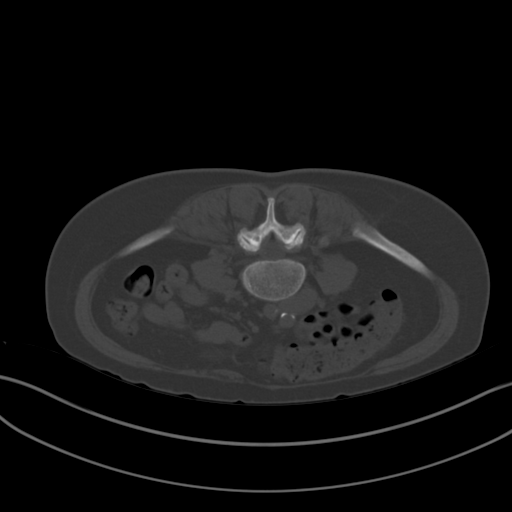

[8 of 14 positions shown; findings below may reference images not displayed]

EXAM:
CT GUIDED BONE MARROW ASPIRATION AND BIOPSY

ANESTHESIA/SEDATION:
Versed 2.0 mg IV, Fentanyl 75 mcg IV

Total Moderate Sedation Time:  17 minutes.

The patient's level of consciousness and physiologic status were
continuously monitored during the procedure by Radiology nursing.

PROCEDURE:
The procedure risks, benefits, and alternatives were explained to
the patient. Questions regarding the procedure were encouraged and
answered. The patient understands and consents to the procedure. A
time out was performed prior to initiating the procedure.

The right gluteal region was prepped with chlorhexidine. Sterile
gown and sterile gloves were used for the procedure. Local
anesthesia was provided with 1% Lidocaine.

Under CT guidance, an 11 gauge On Control bone cutting needle was
advanced from a posterior approach into the right iliac bone. Needle
positioning was confirmed with CT. Initial non heparinized and
heparinized aspirate samples were obtained of bone marrow. Core
biopsy was performed via the On Control drill needle.

COMPLICATIONS:
None
FINDINGS: Inspection of initial aspirate did reveal visible particles. Intact
core biopsy sample was obtained.
IMPRESSION: CT guided bone marrow biopsy of right posterior iliac bone with both
aspirate and core samples obtained.

## 2018-06-03 MED ORDER — PRAVASTATIN 20 MG TABLET
ORAL_TABLET | Freq: Every day | ORAL | 0 refills | 0 days | Status: CP
Start: 2018-06-03 — End: ?

## 2018-06-16 ENCOUNTER — Institutional Professional Consult (permissible substitution): Admit: 2018-06-16 | Discharge: 2018-06-17 | Payer: MEDICARE

## 2018-06-16 DIAGNOSIS — K219 Gastro-esophageal reflux disease without esophagitis: Secondary | ICD-10-CM

## 2018-06-16 DIAGNOSIS — F325 Major depressive disorder, single episode, in full remission: Secondary | ICD-10-CM

## 2018-06-16 DIAGNOSIS — E063 Autoimmune thyroiditis: Secondary | ICD-10-CM

## 2018-06-16 DIAGNOSIS — D473 Essential (hemorrhagic) thrombocythemia: Principal | ICD-10-CM

## 2018-06-16 MED ORDER — PAROXETINE 20 MG TABLET
ORAL_TABLET | Freq: Every morning | ORAL | 1 refills | 0.00000 days | Status: CP
Start: 2018-06-16 — End: ?

## 2018-06-16 MED ORDER — METHIMAZOLE 5 MG TABLET
ORAL_TABLET | Freq: Every day | ORAL | 0 refills | 0 days | Status: CP
Start: 2018-06-16 — End: 2018-09-08

## 2018-06-16 MED ORDER — OMEPRAZOLE 20 MG CAPSULE,DELAYED RELEASE
ORAL_CAPSULE | Freq: Every day | ORAL | 2 refills | 0.00000 days | Status: CP
Start: 2018-06-16 — End: ?

## 2018-06-16 MED ORDER — HYDROXYUREA 500 MG CAPSULE
ORAL_CAPSULE | Freq: Every day | ORAL | 6 refills | 0.00000 days | Status: CP
Start: 2018-06-16 — End: ?

## 2018-08-09 ENCOUNTER — Other Ambulatory Visit: Payer: Self-pay | Admitting: *Deleted

## 2018-08-09 DIAGNOSIS — Z20822 Contact with and (suspected) exposure to covid-19: Secondary | ICD-10-CM

## 2018-08-11 NOTE — Addendum Note (Signed)
Addended by: Brigitte Pulse on: 08/11/2018 03:15 PM   Modules accepted: Orders

## 2018-09-08 MED ORDER — METHIMAZOLE 5 MG TABLET
ORAL_TABLET | Freq: Every day | ORAL | 1 refills | 180.00000 days | Status: CP
Start: 2018-09-08 — End: 2019-09-08

## 2018-11-29 IMAGING — US US ABDOMEN LIMITED
1 series · 11 of 11 positions shown · non-contrast
Comparison: None.

CLINICAL DATA: Essential thrombocytosis

EXAM:
LIMITED ABDOMINAL ULTRASOUND

[Series 1: us abdomen limited · 0.22mm/px · 11 of 11 slices shown]
[im 1/11]
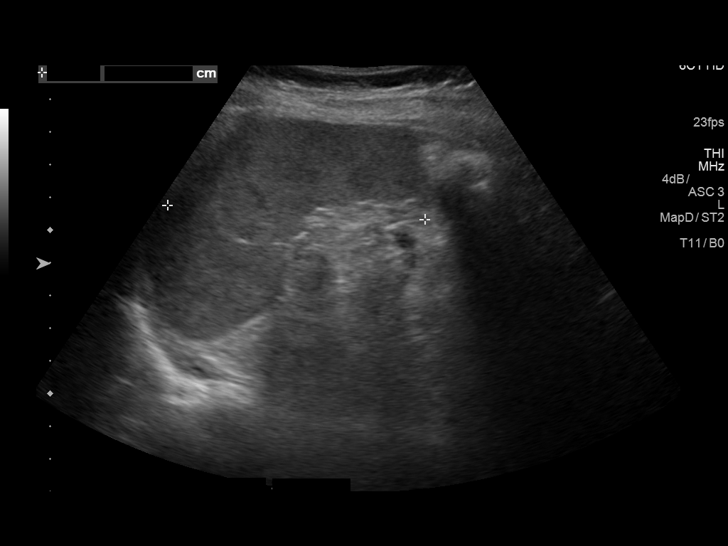
[im 2/11]
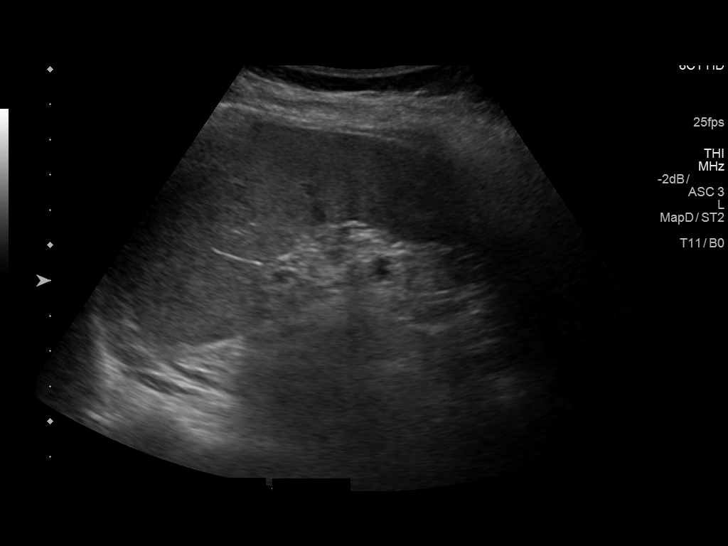
[im 3/11]
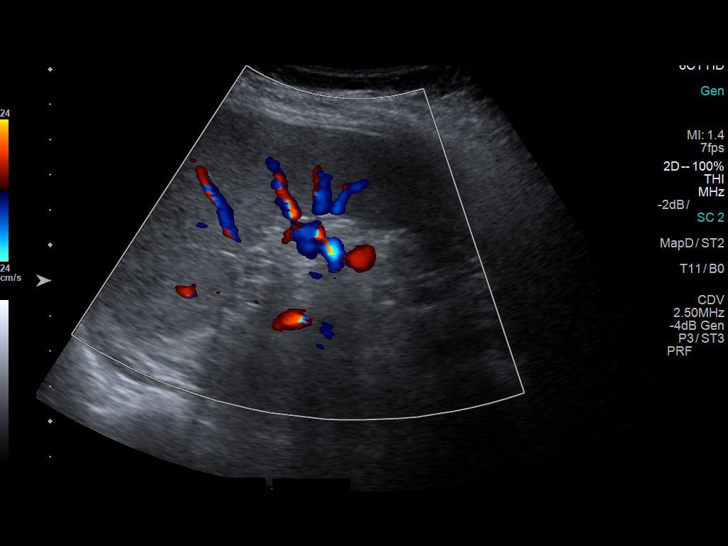
[im 4/11]
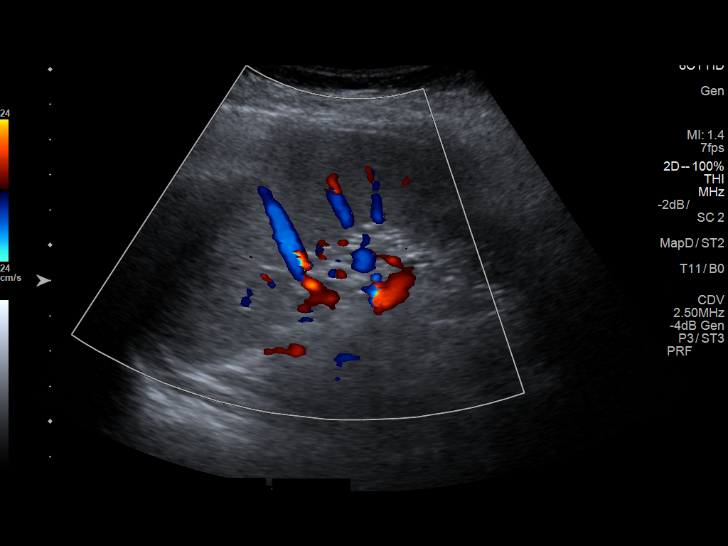
[im 5/11]
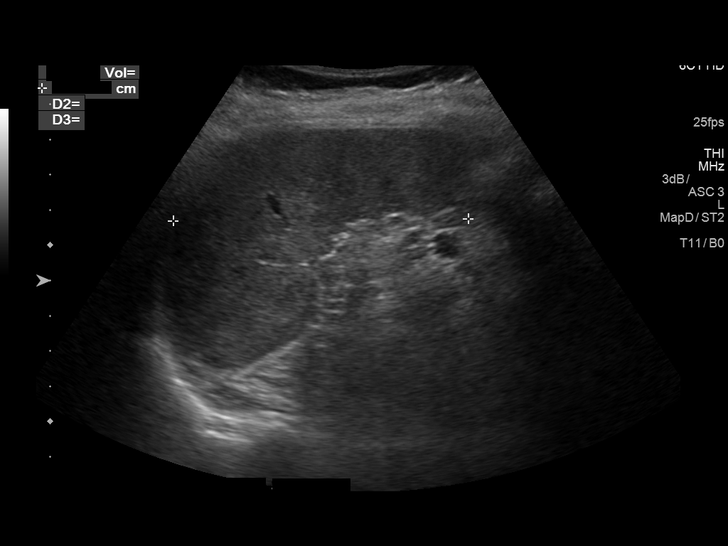
[im 6/11]
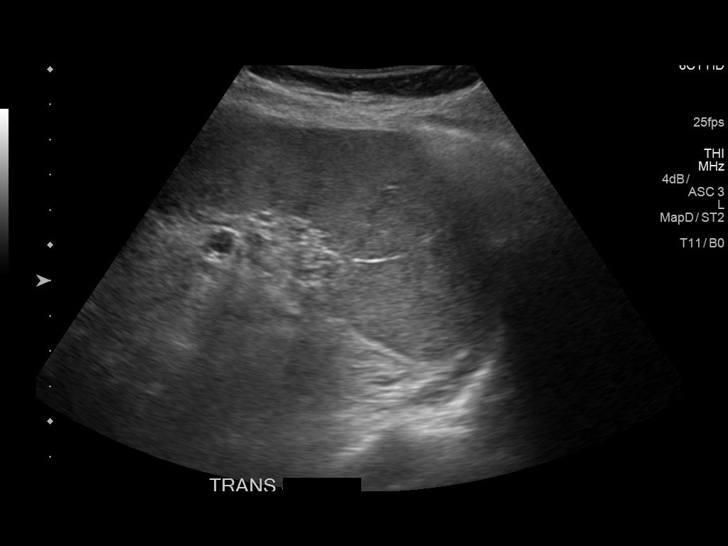
[im 7/11]
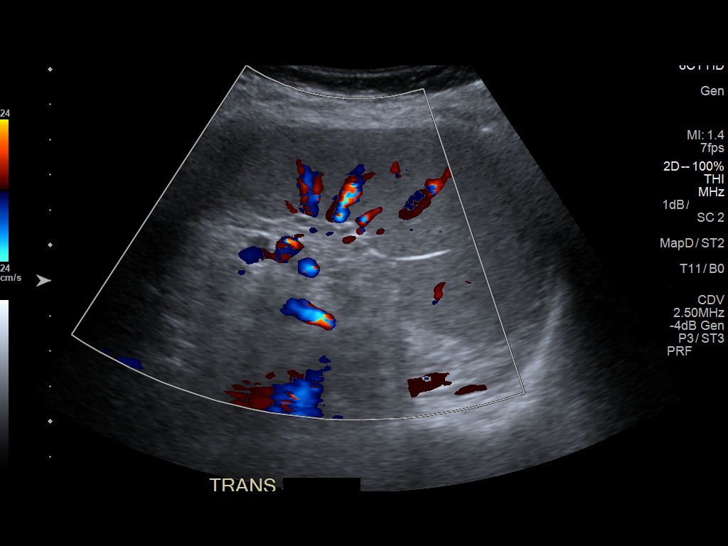
[im 8/11]
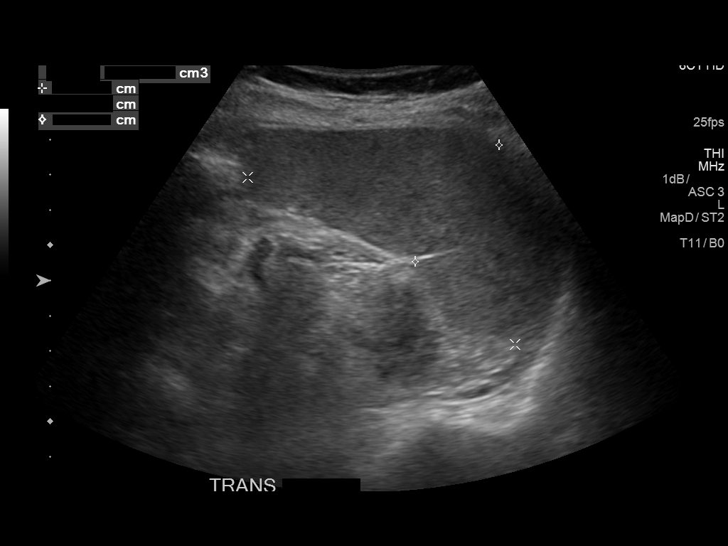
[im 9/11]
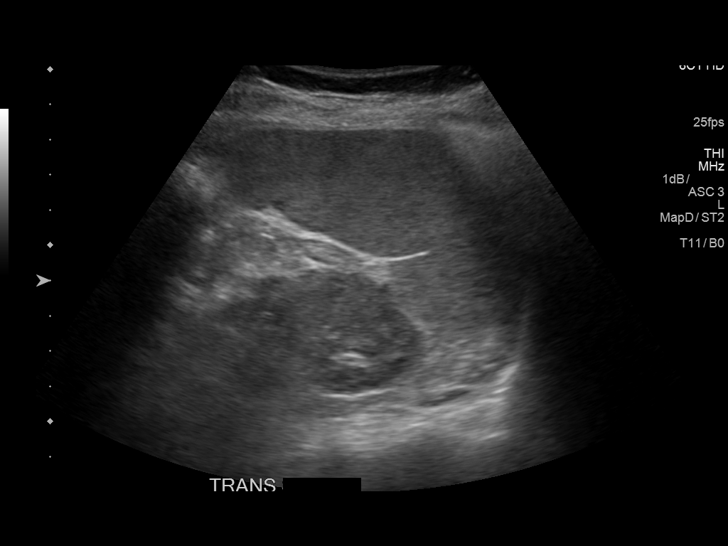
[im 10/11]
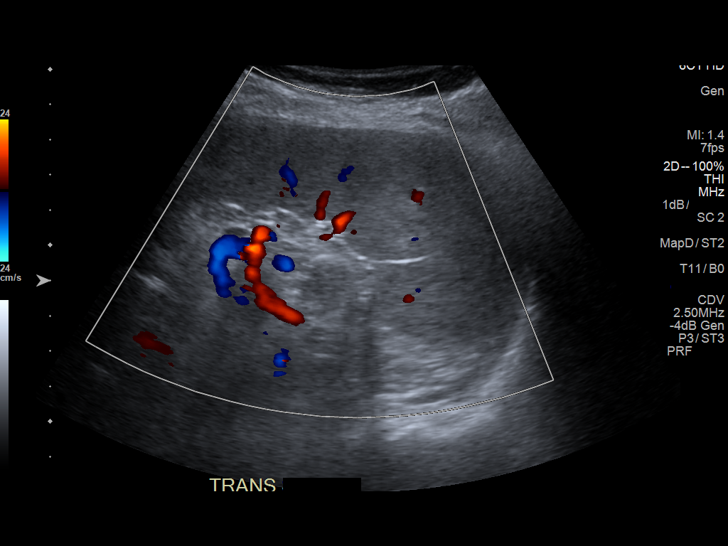
[im 11/11]
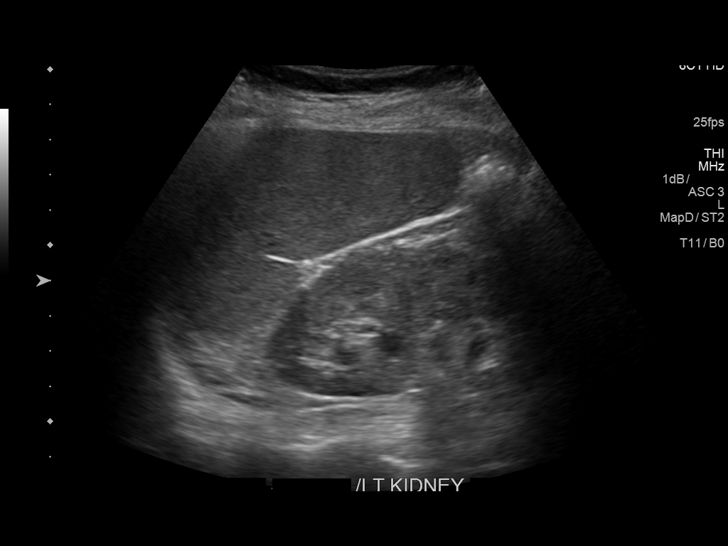

[11 of 11 positions shown; findings below may reference images not displayed]

FINDINGS: Spleen is well visualize without evidence of focal mass lesion. The
splenic volume is calculated 159.5 cm cubic cm. The maximum length
is 3.4 cm.
IMPRESSION: Normal appearing spleen.  No splenomegaly is noted.

## 2019-02-16 DIAGNOSIS — D471 Chronic myeloproliferative disease: Principal | ICD-10-CM

## 2019-02-17 ENCOUNTER — Encounter
Admit: 2019-02-17 | Discharge: 2019-02-17 | Payer: MEDICARE | Attending: Hematology & Oncology | Primary: Hematology & Oncology

## 2019-02-17 DIAGNOSIS — D471 Chronic myeloproliferative disease: Principal | ICD-10-CM

## 2019-03-07 DIAGNOSIS — E782 Mixed hyperlipidemia: Principal | ICD-10-CM

## 2019-03-07 DIAGNOSIS — E063 Autoimmune thyroiditis: Principal | ICD-10-CM

## 2019-03-07 DIAGNOSIS — D473 Essential (hemorrhagic) thrombocythemia: Principal | ICD-10-CM

## 2019-03-07 MED ORDER — PRAVASTATIN 20 MG TABLET
ORAL_TABLET | Freq: Every day | ORAL | 3 refills | 90 days | Status: CP
Start: 2019-03-07 — End: ?

## 2019-03-15 ENCOUNTER — Other Ambulatory Visit: Admit: 2019-03-15 | Discharge: 2019-03-15 | Payer: MEDICARE

## 2019-03-15 ENCOUNTER — Ambulatory Visit
Admit: 2019-03-15 | Discharge: 2019-03-15 | Payer: MEDICARE | Attending: Hematology & Oncology | Primary: Hematology & Oncology

## 2019-03-15 DIAGNOSIS — E782 Mixed hyperlipidemia: Principal | ICD-10-CM

## 2019-03-15 DIAGNOSIS — E063 Autoimmune thyroiditis: Principal | ICD-10-CM

## 2019-03-15 DIAGNOSIS — D471 Chronic myeloproliferative disease: Principal | ICD-10-CM

## 2019-03-28 MED ORDER — RUXOLITINIB 10 MG TABLET
ORAL_TABLET | Freq: Two times a day (BID) | ORAL | 3 refills | 30.00000 days | Status: CP
Start: 2019-03-28 — End: 2019-04-27
  Filled 2019-04-04: qty 30, 15d supply, fill #0

## 2019-03-29 DIAGNOSIS — D471 Chronic myeloproliferative disease: Principal | ICD-10-CM

## 2019-03-30 NOTE — Unmapped (Signed)
[facility] Specialty Medication Onboarding    Specialty Medication: JAKAFI 15MG  TABLETS  Prior Authorization: Approved   Financial Assistance: Yes - copay card approved as secondary   Final Copay/Day Supply: $0 / 15 DAYS    Insurance Restrictions: Yes - max 15 day supply for first 1-3 months of therapy     Notes to Pharmacist: BILL INSURANCE, COPAY CARD, THEN GRANT    The triage team has completed the benefits investigation and has determined that the patient is able to fill this medication at [facility]. Please contact the patient to complete the onboarding or follow up with the prescribing physician as needed.

## 2019-04-04 ENCOUNTER — Institutional Professional Consult (permissible substitution): Admit: 2019-04-04 | Discharge: 2019-04-05 | Payer: MEDICARE | Attending: Pharmacist | Primary: Pharmacist

## 2019-04-04 MED ORDER — ATORVASTATIN 40 MG TABLET
ORAL_TABLET | Freq: Every day | ORAL | 11 refills | 30 days | Status: CP
Start: 2019-04-04 — End: 2020-04-03

## 2019-04-04 MED ORDER — ONDANSETRON HCL 4 MG TABLET
ORAL_TABLET | Freq: Every day | ORAL | 2 refills | 30 days | Status: CP | PRN
Start: 2019-04-04 — End: 2020-04-03

## 2019-04-04 MED FILL — JAKAFI 10 MG TABLET: 15 days supply | Qty: 30 | Fill #0 | Status: AC

## 2019-04-04 NOTE — Unmapped (Signed)
Christy Buchanan is a 64 y.o. 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 Raeanne Gathers discussion with her recently. She has a postiive JAK mutation, and reports having symptoms related to MPN (such as spleen pain, fatigue, itching). She expresses hope this medication can be well tolerated while improving her current symptoms. Her last labs show Hgb 12, PLT 312 (controlled on hydrea - previously higher), ABC 7.3. CMP WNL. Lipids elevated on pravastatin 20 mg. Patient takes aspirin and previously denied symptoms of CVD. Has some constipation at baseline but controlling better with diet (including yogurt and adding seeds/grains). Believes may be prone to nausea on some meds but normally tolerates ok.       Oncologic History:  Oncology History    No history exists.       Weight and Vitals:  Wt Readings from Last 3 Encounters:   03/15/19 58.9 kg (129 lb 12.8 oz)   12/31/17 58.5 kg (128 lb 15.5 oz)   02/22/18 59.2 kg (130 lb 9.6 oz)     Temp Readings from Last 3 Encounters:   03/15/19 36.7 ??C (98 ??F) (Temporal)   02/22/18 36.5 ??C (97.7 ??F) (Oral)     BP Readings from Last 3 Encounters:   03/15/19 131/64   02/22/18 128/70     Pulse Readings from Last 3 Encounters:   03/15/19 81   02/22/18 71       Pertinent Labs:  No visits with results within 1 Day(s) from this visit.   Latest known visit with results is:   Lab on 03/15/2019   Component Date Value Ref Range Status   ??? TSH 03/15/2019 2.785  0.600 - 3.300 uIU/mL Final   ??? Free T4 03/15/2019 0.85  0.71 - 1.40 ng/dL Final   ??? Triglycerides 03/15/2019 153* 1 - 149 mg/dL Final   ??? Cholesterol 03/15/2019 203* 100 - 199 mg/dL Final   ??? HDL 16/11/9602 54  40 - 59 mg/dL Final   ??? LDL Calculated 03/15/2019 540* 60 - 99 mg/dL Final    NHLBI Recommended Ranges, LDL Cholesterol, for Adults (20+yrs) (ATPIII), mg/dL  Optimal              <981  Near Optimal        100-129  Borderline High     130-159  High                160-189  Very High            >=190  NHLBI Recommended Ranges, LDL Cholesterol, for Children (2-19 yrs), mg/dL  Desirable            <191  Borderline High     110-129  High                 >=130     ??? VLDL Cholesterol Cal 03/15/2019 30.6  11 - 41 mg/dL Final   ??? Chol/HDL Ratio 03/15/2019 3.8  <4.7 Final   ??? Non-HDL Cholesterol 03/15/2019 149  mg/dL Final    Non-HDL Cholesterol Recommended Ranges (mg/dL)  Optimal       <829  Near Optimal 130 - 159  Borderline High 160 - 189  High             190 - 219  Very High       >220     ??? FASTING 03/15/2019 Unknown   Final   ??? Glucose, Fasting 03/15/2019 87  70 - 99 mg/dL Final   ??? ALT 16/11/9602 12  <35 U/L Final   ??? CD34 Enumeration 03/15/2019    Final    This test, utilizing analyte-specific reagents (ASR) was developed and its performance characteristics determined by the [facility]. It has not been cleared or approved by the U.S. Food and Drug Administration (FDA).  The FDA has determined that such clearance or approval is not necessary. This test is used for clinical purposes. It should not be regarded as investigational or for research.  * = ASR  = FDA approved reagents    The assay limit of detection is 0.02%.   ??? CD34 03/15/2019 0.02  % Final    Sub-optimal number of events collected   ??? Pre-Apheresis CD34 03/15/2019 1.46  /uL Final   ??? CD45 03/15/2019 100  % Final   ??? Sodium 03/15/2019 140  135 - 145 mmol/L Final   ??? Potassium 03/15/2019 4.1  3.5 - 5.0 mmol/L Final   ??? Chloride 03/15/2019 106  98 - 107 mmol/L Final   ??? Anion Gap 03/15/2019 6* 7 - 15 mmol/L Final   ??? CO2 03/15/2019 28.0  22.0 - 30.0 mmol/L Final   ??? BUN 03/15/2019 16  7 - 21 mg/dL Final   ??? Creatinine 03/15/2019 0.75  0.60 - 1.00 mg/dL Final   ??? BUN/Creatinine Ratio 03/15/2019 21   Final   ??? EGFR CKD-EPI Non-African American,* 03/15/2019 85  >=60 mL/min/1.47m2 Final   ??? EGFR CKD-EPI African American, Fem* 03/15/2019 >90  >=60 mL/min/1.88m2 Final   ??? Glucose 03/15/2019 87  70 - 179 mg/dL Final   ??? Calcium 54/10/8117 8.9  8.5 - 10.2 mg/dL Final   ??? Albumin 14/78/2956 4.0  3.5 - 5.0 g/dL Final   ??? Total Protein 03/15/2019 7.3  6.5 - 8.3 g/dL Final   ??? Total Bilirubin 03/15/2019 0.3  0.0 - 1.2 mg/dL Final   ??? AST 21/30/8657 24  14 - 38 U/L Final   ??? ALT 03/15/2019 12  <35 U/L Final   ??? Alkaline Phosphatase 03/15/2019 105  38 - 126 U/L Final   ??? Collection 03/15/2019 Collected   Final   ??? LDH 03/15/2019 542  338 - 610 U/L Final   ??? WBC 03/15/2019 7.3  4.5 - 11.0 10*9/L Final   ??? RBC 03/15/2019 3.16* 4.00 - 5.20 10*12/L Final   ??? HGB 03/15/2019 12.0  12.0 - 16.0 g/dL Final   ??? HCT 84/69/6295 35.1* 36.0 - 46.0 % Final   ??? MCV 03/15/2019 111.1* 80.0 - 100.0 fL Final   ??? MCH 03/15/2019 37.9* 26.0 - 34.0 pg Final   ??? MCHC 03/15/2019 34.1  31.0 - 37.0 g/dL Final   ??? RDW 28/41/3244 14.1  12.0 - 15.0 % Final   ??? MPV 03/15/2019 7.8  7.0 - 10.0 fL Final   ??? Platelet 03/15/2019 312  150 - 440 10*9/L Final   ??? Neutrophils % 03/15/2019 65.4  % Final   ??? Lymphocytes % 03/15/2019 25.6  % Final   ??? Monocytes % 03/15/2019 5.2  % Final   ??? Eosinophils % 03/15/2019 0.8  % Final   ??? Basophils % 03/15/2019  0.5  % Final   ??? Absolute Neutrophils 03/15/2019 4.8  2.0 - 7.5 10*9/L Final   ??? Absolute Lymphocytes 03/15/2019 1.9  1.5 - 5.0 10*9/L Final   ??? Absolute Monocytes 03/15/2019 0.4  0.2 - 0.8 10*9/L Final   ??? Absolute Eosinophils 03/15/2019 0.1  0.0 - 0.4 10*9/L Final   ??? Absolute Basophils 03/15/2019 0.0  0.0 - 0.1 10*9/L Final   ??? Large Unstained Cells 03/15/2019 2  0 - 4 % Final   ??? Macrocytosis 03/15/2019 Marked* Not Present Final   ??? von Willebrand Factor Antigen 03/15/2019 144  47 - 157 % Final   ??? Von Willebrand Factor Activity 03/15/2019 173* 50 - 168 % Final   ??? VWF INTERPRETATION 03/15/2019    Final    No current laboratory evidence of von Willebrand disease based on normal results of factor VIII activity, von Willebrand factor activity, and von Willebrand factor antigen. Clinical correlation will be of interest, as no single measurement can exclude von Willebrand disease, as von Willebrand factor antigen and activity and/or factor VIII may be increased above baseline levels by acute or chronic inflammation, stress or adrenergic stimuli, pregnancy or estrogen and oral contraceptive therapy, or liver disease. Recent administration of desmopressin or von Willebrand factor concentrate may also mask the diagnosis of von Willebrand disease. Interpretation not reviewed by physician.     ??? Factor VIII Activity 03/15/2019 176  57 - 176 % Final   ??? Case Report 03/15/2019    Final                    Value:Molecular Genetics Report                         Case: [id number]                                 Authorizing Provider:  Timoteo Ace        Collected:           03/15/2019 1505                                     Jerilee Hoh, MD                                                                 Ordering Location:     [facility]    Received:            03/15/2019 1515                                     [facility] [address] [place]                                                     Pathologist:           Diamantina Monks, MD  Specimen:    Blood                                                                                     ??? Myeloid Mutation Panel - MDS & MPN 03/15/2019    Final                    Value:This result contains rich text formatting which cannot be displayed here.   ??? Smear Review Comments 03/15/2019 See Comment* Undefined Final    SLIDE REVIEWED         Allergies: No Known Allergies    Drug Interactions: none       Current Medications:  Current Outpatient Medications   Medication Sig Dispense Refill   ??? aspirin (ECOTRIN) 81 MG tablet Take 81 mg by mouth.     ??? cetirizine (ZYRTEC) 10 MG tablet Take 10 mg by mouth.     ??? cyanocobalamin 1000 MCG tablet Take 1,000 mcg by mouth.     ??? docusate sodium (COLACE) 100 MG capsule Take 100 mg by mouth daily as needed.      ??? meTHIMazole (TAPAZOLE) 5 MG tablet Take 0.5 tablets (2.5 mg total) by mouth daily. 90 tablet 1   ??? multivitamin (MULTIVITAMIN) per tablet Take 1 tablet by mouth daily.      ??? omega-3 acid ethyl esters (LOVAZA) 1 gram capsule Take 1 g by mouth.     ??? omeprazole (PRILOSEC) 20 MG capsule Take 1 capsule (20 mg total) by mouth daily. 90 capsule 2   ??? PARoxetine (PAXIL) 20 MG tablet Take 1 tablet (20 mg total) by mouth every morning. 90 tablet 1   ??? raloxifene (EVISTA) 60 mg tablet Take 1 tablet (60 mg total) by mouth daily. 90 tablet 3   ??? atorvastatin (LIPITOR) 40 MG tablet Take 1 tablet (40 mg total) by mouth daily. 30 tablet 11   ??? ondansetron (ZOFRAN) 4 MG tablet Take 1 tablet (4 mg total) by mouth daily as needed for nausea. 30 tablet 2   ??? ruxolitinib (JAKAFI) 10 mg tablet Take 1 tablet (10 mg total) by mouth Two (2) times a day. 60 tablet 3     No current facility-administered medications for this visit.          Assessment: ChristyKille is a 64 y.o. 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 This may be titrated upwards per Dr. Raeanne Gathers instructions.   - I provided an overview of administration (BID with or without food), refills, and ADRs (lab monitoring for blood counts, LFTs, lipids/other metabolic effects; diarrhea; headache, muscle spasms)  - Refills for zofran sent to local pharmacy  - Since her lipids (particularly LDL) was still elevated despite pravastatin, and jakafi can cause metabolic impacts, she will switch to atorvastatin 40 mg  - She has a phone call with Dr. Zenaida Niece tomorrow, requested a phone visit to be added for me  in 3 weeks. MD should comment on plan for lab and provider follow-up     F/u:  Future Appointments   Date Time Provider Department Center   04/05/2019  4:00 PM Halford Decamp, MD [facility]   04/06/2019  8:40 AM [facility] 1 [facility] [place] [facility]       Manfred Arch, PharmD, BCOP, CPP  Pager: [phone]      I spent 25 minutes on the phone with the patient on the date of service. I spent an additional 15 minutes on pre- and post-visit activities.     The patient was physically located in [place] or a state in which I am permitted to provide care. The patient and/or parent/guardian understood that s/he may incur co-pays and cost sharing, and agreed to the telemedicine visit. The visit was reasonable and appropriate under the circumstances given the patient's presentation at the time.    The patient and/or parent/guardian has been advised of the potential risks and limitations of this mode of treatment (including, but not limited to, the absence of in-person examination) and has agreed to be treated using telemedicine. The patient's/patient's family's questions regarding telemedicine have been answered.     If the visit was completed in an ambulatory setting, the patient and/or parent/guardian has also been advised to contact their provider???s office for worsening conditions, and seek emergency medical treatment and/or call 911 if the patient deems either necessary.

## 2019-04-04 NOTE — Unmapped (Signed)
[facility]   Patient Onboarding/Medication Counseling    Christy Buchanan is a 64 y.o. 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  I am speaking to the patient.    Was a Nurse, learning disability used for this call? No    Verified patient's date of birth / HIPAA.    Specialty medication(s) to be sent: Hematology/Oncology: [name]      Non-specialty medications/supplies to be sent: n/a      Medications not needed at this time: n/a         Jakafi (ruxolitinib)    Medication & Administration     Dosage: Take 1 tablet (10 mg total) by mouth Two (2) times a day.    Administration:   ? Take with or without food    Adherence/Missed dose instructions:  ? Skip the missed dose and go back to your normal time.  ? Do not take 2 doses at the same time or extra doses.    Goals of Therapy   ? It is used to treat myelofibrosis.  ? It is used to treat polycythemia vera.  ? It is used to treat graft-versus-host disease (GVHD)    Side Effects & Monitoring Parameters   ? Common side effects  ? Diarrhea, constipation, stomach pain or gas  ? Dizziness  ? Headache  ? Muscle spasm  ? Weight gain  ? Nose or throat irritation  ? Joint pain    ? The following side effects should be reported to the provider  ? Allergic reaction  ? Infection like symptoms  ? Unexplained bleeding or bruising  ? Painful skin rash with blisters  ? Upset stomach or vomiting  ? Shortness of breath  ? Feeling tired or weak  ? Swelling  ? Confusion, memory problems, low mood, changes in behavior, change in strength on 1 side is greater than the other, trouble speaking or thinking, change in balance, or change in eyesight.  ? Change in color or size of a mole, any new or changing skin lump or growth    ? Monitoring Parameters:  ? CBC baseline and every 2-4 weeks  ? Lipid panels 8-12 weeks after therapy initiated  ? TB skin test prior to starting    Contraindications, Warnings, & Precautions     ? Hematologic toxicity, including thrombocytopenia, anemia, and neutropenia  ? Infections  ? Lipid abnormalities  ? Non-melanoma skin cancer  ? Use caution in patients with hepatic or renal impairment  ? Withdrawal syndrome - fever, respiratory distress, hypotension, multiorgan failure, splenomegaly, worsening cytopenias, hemodynamic compensation, and septic shock-like syndrome have been reported with treatment tapering or discontinuation. Patients should not interrupt/discontinue treatment without consulting a healthcare provider.    Pregnancy & Lactation Considerations  ? Not recommended for use during pregnancy  ? Breastfeeding is not recommended during treatment and for 2 weeks after the last dose    Drug/Food Interactions     ? Medication list reviewed in Epic. The patient was instructed to inform the care team before taking any new medications or supplements. No drug interactions identified.   ? Avoid grapefruit juice  ? Ask your doctor before getting any live or inactivated vaccinations    Storage, Handling Precautions, & Disposal     ? Store at room temperature  ? Keep away from children and pets  ? Caregivers recommended to wear gloves when handling medication        Current Medications (including OTC/herbals), Comorbidities and Allergies  Current Outpatient Medications   Medication Sig Dispense Refill   ??? aspirin (ECOTRIN) 81 MG tablet Take 81 mg by mouth.     ??? atorvastatin (LIPITOR) 40 MG tablet Take 1 tablet (40 mg total) by mouth daily. 30 tablet 11   ??? cetirizine (ZYRTEC) 10 MG tablet Take 10 mg by mouth.     ??? cyanocobalamin 1000 MCG tablet Take 1,000 mcg by mouth.     ??? docusate sodium (COLACE) 100 MG capsule Take 100 mg by mouth daily as needed.      ??? meTHIMazole (TAPAZOLE) 5 MG tablet Take 0.5 tablets (2.5 mg total) by mouth daily. 90 tablet 1   ??? multivitamin (MULTIVITAMIN) per tablet Take 1 tablet by mouth daily.      ??? omega-3 acid ethyl esters (LOVAZA) 1 gram capsule Take 1 g by mouth.     ??? omeprazole (PRILOSEC) 20 MG capsule Take 1 capsule (20 mg total) by mouth daily. 90 capsule 2   ??? ondansetron (ZOFRAN) 4 MG tablet Take 1 tablet (4 mg total) by mouth daily as needed for nausea. 30 tablet 2   ??? PARoxetine (PAXIL) 20 MG tablet Take 1 tablet (20 mg total) by mouth every morning. 90 tablet 1   ??? raloxifene (EVISTA) 60 mg tablet Take 1 tablet (60 mg total) by mouth daily. 90 tablet 3   ??? ruxolitinib (JAKAFI) 10 mg tablet Take 1 tablet (10 mg total) by mouth Two (2) times a day. 60 tablet 3     No current facility-administered medications for this visit.        No Known Allergies    Patient Active Problem List   Diagnosis   ??? Autoimmune thyroiditis   ??? Abnormal Pap smear of cervix   ??? Family history of colon cancer   ??? Family history of thyroid cancer   ??? Gastroesophageal reflux disease   ??? Intermittent palpitations   ??? History of breast cancer   ??? Mixed hyperlipidemia   ??? Postmenopausal osteoporosis   ??? Pure hypercholesterolemia   ??? Sjogrens syndrome (CMS-HCC)   ??? Vitamin D deficiency   ??? Depression, major, single episode, complete remission (CMS-HCC)   ??? Essential thrombocythemia (CMS-HCC)   ??? Anxiety and depression       Reviewed and up to date in Epic.    Appropriateness of Therapy     Is medication and dose appropriate based on diagnosis? Yes    Prescription has been clinically reviewed: Yes    Baseline Quality of Life Assessment      How many days over the past month did your condition  keep you from your normal activities? For example, brushing your teeth or getting up in the morning. 0    Financial Information     Medication Assistance provided: Prior Authorization and Copay Assistance    Anticipated copay of $0 reviewed with patient. Verified delivery address. Insurance will only pay for 15 days at a time for first 1-3 months of therapy    Delivery Information     Scheduled delivery date: 04/04/19    Expected start date: 04/04/19    Medication will be delivered via Next Day Courier to the prescription address in [facility]. This shipment will not require a signature.      Explained the services we provide at [facility] and that each month we would call to set up refills.  Stressed importance of returning phone calls so that we could ensure they receive their medications  in time each month.  Informed patient that we should be setting up refills 7-10 days prior to when they will run out of medication.  A pharmacist will reach out to perform a clinical assessment periodically.  Informed patient that a welcome packet and a drug information handout will be sent.      Patient verbalized understanding of the above information as well as how to contact the pharmacy at [phone] option 4 with any questions/concerns.  The pharmacy is open Monday through Friday 8:30am-4:30pm.  A pharmacist is available 24/7 via pager to answer any clinical questions they may have.    Patient Specific Needs     ? Does the patient have any physical, cognitive, or cultural barriers? No    ? Patient prefers to have medications discussed with  Patient     ? Is the patient or caregiver able to read and understand education materials at a high school level or above? Yes    ? Patient's primary language is  English     ? Is the patient high risk? No     ? Does the patient require a Care Management Plan? No     ? Does the patient require physician intervention or other additional services (i.e. nutrition, smoking cessation, social work)? No      Troyce Febo  Anders Grant  [facility] Specialty Pharmacist

## 2019-04-05 ENCOUNTER — Telehealth
Admit: 2019-04-05 | Discharge: 2019-04-06 | Payer: MEDICARE | Attending: Hematology & Oncology | Primary: Hematology & Oncology

## 2019-04-05 NOTE — Unmapped (Signed)
Reviewed Meds and Allergies, confirmed pharmacy and informed how to use Doximity. Visit complete.

## 2019-04-06 ENCOUNTER — Ambulatory Visit: Admit: 2019-04-06 | Discharge: 2019-04-06 | Payer: MEDICARE

## 2019-04-06 NOTE — Unmapped (Signed)
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  Of note, she has no circulating immature or CD34+ cells, no increase in fibrosis, a normal LDH, and no palpable splenomegaly.  Thus, we can rule out the pre-fibrotic phase of MF, which is in the most likely alternative diagnosis.      She has an intermediate risk for thrombosis by IPSET1 and high risk by IPSET2.  Dailey aspirin and cytoreduction are appropriate.  Of note, CV risk factors for this calculation include diabetes, hypertension, and active smoking.  She should be strongly encouraged to reduce these risk factors.       Given her high MPN symptom score of 48, she was started on ruxolitinib. This was successful in decreasing the score though I am somewhat disappointed that we did not see a more global reduction. It is possible that some of these symptoms are related to her SS.  Nevertheless, I suspect we will need to increase her ruxolitinib in the future.  For now, she can stop her hydroxyurea.      PLAN:  1) Stop hydroxyurea  2) Check CBC at [facility] in [place] in three weeks.   3) If her platelets are > 200, increase her ruxolitinib to 20 mg BID   4) RTC in 8 weeks.   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 CBC: 7.3/12.0/312  ?? CD34: 0.02  ?? VWF: Normal   ?? MPN score: 48 to 49  ?? Began ruxolitinib 10 mg BID    04/05/19  ?? CBC: 7.3/12.0/312  ?? MPN score: 36 - 38  ?? Ruxolitinib 10 mg BID; Hydrea was stopped    DISCLAIMER: I spent 18 minutes on the real-time audio and video with the patient on the date of service. I spent an additional 12 minutes on pre- and post-visit activities.     The patient was physically located in [place] or a state in which I am permitted to provide care. The patient and/or parent/guardian understood that s/he may incur co-pays and cost sharing, and agreed to the telemedicine visit. The visit was reasonable and appropriate under the circumstances given the patient's presentation at the time.    The patient and/or parent/guardian has been advised of the potential risks and limitations of this mode of treatment (including, but not limited to, the absence of in-person examination) and has agreed to be treated using telemedicine. The patient's/patient's family's questions regarding telemedicine have been answered.     If the visit was completed in an ambulatory setting, the patient and/or parent/guardian has also been advised to contact their provider???s office for worsening conditions, and seek emergency medical treatment and/or call 911 if the patient deems either necessary.    INTERVAL HX:    MPN 10 Score  Symptom (Absent 0 to 10  worst imaginable) 2/03 2/24         Fatigue in the past 24 hours 6 9         Filling up quickly when you eat (Early satiety)  7 7         Abdominal discomfort  3 4         Inactivity 5 5 - 6         Problems with concentration - 6 7-8         Numbness/ Tingling (in my hands and feet) 5 to 6 0         Night sweats 0 0         Itching (pruritus) 10 7         Bone pain (diffuse not joint pain or arthritis) 6 6         Fever (>100 F) 0 0         Unintentional weight loss last 6 months 0 0         TOTAL 48 - 49 36 - 38           PMHx  ??? 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 no malignancy; neg LN  o Txed with Surgery, radiation, auto BMT, raloxifene  o Negative WU  for 28 genes associated with hereditary cancers  o Raloxifene  ??? Sjogren's syndrome  ??? Autoimmune thyroiditis - Txed with methimazole   ??? GERD - Txed with PPI  ??? Mixed hyperlipidemia - Tx with Lovaza, pravastatin     FHx:  ?? Daughter w/ spina bifida, BRCA 1081G>A mutation (w/o cancer)      2016 WHO Diagnostic Criteria for MPNs  ET  Major Criteria  ??? Plts > 450  ??? BM biopsy showing proliferation mainly of the megakaryocyte lineage with increased numbers of enlarged, mature megakaryocytes with hyperlobulated nuclei. No significant left-shift of neutrophil granulopoiesis or erythropoiesis and very rarely minor (grade 1) increase in reticulin fibers  ??? Not meeting WHO criteria for BCR-ABL1?+?CML, PV, PMF, MDS, or other myeloid neoplasms)  ??? Presence of JAK2, CALR or MPL mutation    Minor Criteria  ??? Presence of a clonal marker (e.g., abnormal karyotype)  ??? Absence of evidence for reactive thrombocytosis    Diagnosis: All major criteria or first three major and one minor     PMF  Major criteria  ??? Proliferation and atypia of megakaryocytes accompanied by either reticulin and/or collagen fibrosis  grades 2 or 3 on a scale of 0 to 3  ??? Not meeting WHO criteria for ET, PV, BCR-ABL1+ CML, myelodysplastic syndromes, or other myeloid neoplasm  ??? Presence of JAK2, CALR or MPL mutation or in the absence of these mutations, presence of another clonal marker or absence of reactive myelofibrosis    Minor Criteria  ??? Anemia not attributed to another comorbid condition  ??? WBC > 11.0  ??? Palpable Splenomegaly   ??? LDH > ULN  ??? Leukoerythroblastosis     Diagnosis: 3 major and at least 1 minor confirmed in two consecutive determinations    IPSET Model for Prediction of Thrombosis    Age:    2   < 60: 0   > 60: 2  WBC count (x10e9)  0   < 11: 0   > 11: 1  Hx of Thrombosis  0   No: 0   Yes: 1  --------------------------------------------  Total:  2     10 yr thrombosis risk, median survival (yrs)  Lo Risk: 0 pts    --> 89%, NR  Int Risk: 1-2 pts --> 86%, 24.5 Hi Risk: 3-4 pts  --> 69%, 14.7    IPSET II  Age:    1   < 60: 0   > 60: 1  CVS Risk Factor  0   No: 0   Yes: 1   Hx of Thrombosis  0   No: 0   Yes: 2  JAK2     2   No: 0   Yes: 2  --------------------------------------------  Total:    3    Annual Thrombosis Risk  Lo Risk: 0-1 pts  --> 1.03%  Int Risk: 2 pts     --> 2.35%  Hi Risk: 3-6 pts  --> 3.56%     NOTE  ?? JAK2 conveys a higher risk of thrombosis  ?? CALR conveys a higher risk of bleeding    Newer Model:  [email or web address]    Primary prophylaxis of thrombosis  All patients: Aspirin unless   ?????? VWF activity <30%,  ?????? Platelet >?1 million,  ?????? CALR-mutated low-risk ET    PV with Hct >?45%: Add phlebotomy/cytoreduction to target Hct <?45%  Age >?60 years and/or prior history of thrombosis: Add cytoreduction  Secondary prophylaxis after thrombotic event  ?All patients: Cytoreduction  ?Typical VTE: Consider indefinite VKA for most patients; aspirin if not on VKA  ?Atypical VTE: Indefinite VKA  ?Arterial thrombosis: Aspirin

## 2019-04-11 DIAGNOSIS — K219 Gastro-esophageal reflux disease without esophagitis: Principal | ICD-10-CM

## 2019-04-11 MED ORDER — OMEPRAZOLE 20 MG CAPSULE,DELAYED RELEASE
ORAL_CAPSULE | 0 refills | 0 days | Status: CP
Start: 2019-04-11 — End: ?

## 2019-04-11 NOTE — Unmapped (Signed)
Here's the plan for Ms Gilland:    1) I asked her to stop hydroxyurea  2) I would like her to get a CBC at [facility] in [place] prior to her visit on the 16th    - I have put in the order   - Sulin: Could you call her and remind her to get this done?     3) If her platelets are > 200, increase her ruxolitinib to 20 mg BID    - Katie: Could you follow up on this plan? I have not changed her script yet.      4) I can see her in 8 weeks   - Gina: I don't know if this visit got scheduled.

## 2019-04-11 NOTE — Unmapped (Signed)
Request sent to onc staffing to r/s appt to 4/21 (8wks)    I LVM with patient Christy Buchanan to confirm appointments on the following date(s): 3/17 [name] video visit r/s to 4/21    Gina L Powe

## 2019-04-11 NOTE — Unmapped (Signed)
Hi Dr [name]    She is scheduled for a video visit with you on 3/17. Please let me know if I should r/s.    Thanks  Almira Coaster

## 2019-04-11 NOTE — Unmapped (Signed)
I LVM with patient CEAIRA ERNSTER to confirm appointments on the following date(s): 3/17 [name] video visit r/s to 4/21    Gina L Powe

## 2019-04-12 NOTE — Unmapped (Signed)
Should I contact pt to get labs drawn next week?    Almira Coaster

## 2019-04-14 NOTE — Unmapped (Signed)
[facility] Refill Coordination Note    Specialty Medication(s) to be Shipped:   Hematology/Oncology: Christy Buchanan    Other medication(s) to be shipped: n/a     Christy Buchanan, DOB: September 05, 1955  Phone: [phone] (home)       All above HIPAA information was verified with patient.     Was a Nurse, learning disability used for this call? No    Completed refill call assessment today to schedule patient's medication shipment from the [facility] [phone]).       Specialty medication(s) and dose(s) confirmed: Regimen is correct and unchanged.   Changes to medications: Christy Buchanan reports no changes at this time.  Changes to insurance: No  Questions for the pharmacist: No    Confirmed patient received Welcome Packet with first shipment. The patient will receive a drug information handout for each medication shipped and additional FDA Medication Guides as required.       DISEASE/MEDICATION-SPECIFIC INFORMATION        N/A    SPECIALTY MEDICATION ADHERENCE     Medication Adherence    Patient reported X missed doses in the last month: 0  Specialty Medication: Jakafi  Patient is on additional specialty medications: No                Jakafi 10 mg: 5 days of medicine on hand       SHIPPING     Shipping address confirmed in Epic.     Delivery Scheduled: Yes, Expected medication delivery date: 04/17/19.     Medication will be delivered via Same Day Courier to the prescription address in Epic WAM.    Christy Buchanan  Christy Buchanan   [facility] Specialty Pharmacist

## 2019-04-17 MED FILL — JAKAFI 10 MG TABLET: 15 days supply | Qty: 30 | Fill #1 | Status: AC

## 2019-04-17 MED FILL — JAKAFI 10 MG TABLET: ORAL | 15 days supply | Qty: 30 | Fill #1

## 2019-04-26 LAB — CBC W/ DIFFERENTIAL
BANDED NEUTROPHILS ABSOLUTE COUNT: 0 10*3/uL (ref 0.0–0.1)
BASOPHILS ABSOLUTE COUNT: 0 10*3/uL (ref 0.0–0.2)
EOSINOPHILS ABSOLUTE COUNT: 0.1 10*3/uL (ref 0.0–0.4)
HEMATOCRIT: 33.5 % — ABNORMAL LOW (ref 34.0–46.6)
HEMOGLOBIN: 11.5 g/dL (ref 11.1–15.9)
IMMATURE GRANULOCYTES: 0 %
LYMPHOCYTES RELATIVE PERCENT: 45 %
MEAN CORPUSCULAR HEMOGLOBIN CONC: 34.3 g/dL (ref 31.5–35.7)
MEAN CORPUSCULAR HEMOGLOBIN: 36.2 pg — ABNORMAL HIGH (ref 26.6–33.0)
MEAN CORPUSCULAR VOLUME: 105 fL — ABNORMAL HIGH (ref 79–97)
MONOCYTES ABSOLUTE COUNT: 0.4 10*3/uL (ref 0.1–0.9)
MONOCYTES RELATIVE PERCENT: 8 %
NEUTROPHILS ABSOLUTE COUNT: 2.5 10*3/uL (ref 1.4–7.0)
NEUTROPHILS RELATIVE PERCENT: 45 %
PLATELET COUNT: 413 10*3/uL (ref 150–450)
RED BLOOD CELL COUNT: 3.18 x10E6/uL — ABNORMAL LOW (ref 3.77–5.28)
RED CELL DISTRIBUTION WIDTH: 12.4 % (ref 11.7–15.4)
WHITE BLOOD CELL COUNT: 5.6 10*3/uL (ref 3.4–10.8)

## 2019-04-26 LAB — LACTATE DEHYDROGENASE: Lactate dehydrogenase:CCnc:Pt:Ser/Plas:Qn:: 269 — ABNORMAL HIGH

## 2019-04-26 LAB — EOSINOPHILS RELATIVE PERCENT: Eosinophils/100 leukocytes:NFr:Pt:Bld:Qn:Automated count: 1

## 2019-04-26 NOTE — Unmapped (Signed)
[facility] Refill Coordination Note    Specialty Medication(s) to be Shipped:   Hematology/Oncology: Christy Buchanan    Other medication(s) to be shipped: n/a     Christy Buchanan, DOB: 1955-04-27  Phone: [phone] (home)       All above HIPAA information was verified with patient.     Was a Nurse, learning disability used for this call? No    Completed refill call assessment today to schedule patient's medication shipment from the [facility] [phone]).       Specialty medication(s) and dose(s) confirmed: Regimen is correct and unchanged.   Changes to medications: Christy Buchanan reports no changes at this time.  Changes to insurance: No  Questions for the pharmacist: No    Confirmed patient received Welcome Packet with first shipment. The patient will receive a drug information handout for each medication shipped and additional FDA Medication Guides as required.       DISEASE/MEDICATION-SPECIFIC INFORMATION        N/A    SPECIALTY MEDICATION ADHERENCE     Medication Adherence    Patient reported X missed doses in the last month: 0  Specialty Medication: Jakafi  Patient is on additional specialty medications: No                Jakfafi 10 mg: 7 days of medicine on hand       SHIPPING     Shipping address confirmed in Epic.     Delivery Scheduled: Yes, Expected medication delivery date: 05/02/19.     Medication will be delivered via Same Day Courier to the prescription address in Epic WAM.    Christy Buchanan  Christy Buchanan   [facility] Specialty Pharmacist

## 2019-05-02 ENCOUNTER — Institutional Professional Consult (permissible substitution): Admit: 2019-05-02 | Discharge: 2019-05-03 | Payer: MEDICARE | Attending: Pharmacist | Primary: Pharmacist

## 2019-05-02 MED ORDER — RUXOLITINIB 20 MG TABLET
ORAL_TABLET | Freq: Two times a day (BID) | ORAL | 3 refills | 30 days | Status: CP
Start: 2019-05-02 — End: 2019-06-01
  Filled 2019-05-05: qty 60, 30d supply, fill #0

## 2019-05-02 MED FILL — JAKAFI 10 MG TABLET: 15 days supply | Qty: 30 | Fill #2 | Status: AC

## 2019-05-02 MED FILL — JAKAFI 10 MG TABLET: ORAL | 15 days supply | Qty: 30 | Fill #2

## 2019-05-02 NOTE — Unmapped (Signed)
Christy Buchanan is a 64 y.o. female with MPN who I am seeing in clinic today for oral chemotherapy monitoring    Encounter Date: 05/02/2019    Current Treatment: Jakafi 10 mg BID    For oral chemotherapy:  Pharmacy: [facility]   Medication Access: grant - $0/month    Interval History:   Christy Buchanan is doing well so far on Jakafi. From a drug ADR perspective, she may notice more gas bubbles and perhaps continuing MSK pain. She has days she doesn't feel as well but challenging to know if Jakafi vs disease (likely disease related, and busy job). Will rest if really tired, working from home. Some dizziness here and there which may be from [facility]. Denies skin rash, plus the itching from her MPN has nearly stopped. Some nausea taking zofran very infrequently. Constipation has been a chronic issue     I performed the following assessment of her disease symptoms while on Jakafi:    On a scale of 0-10 (absent to worst imaginable), during the past week, how much difficulty do you have with each of the following symptoms?    -Please rate your WORST level of fatigue (weariness/tiredness) in the past 24 hours on a scale of 0 to 10: 7 (the same)  -Early Satiety: 5 (improved)  -Abdominal Discomfort: 5 (improved)  -Inactivity: 5 (the same)  -Problems with concentration (compared with prior to MPN dx): 6 (the same)  -Numbness/Tingling (in hands/feet): 0 (non-issue)  -Night sweats: 1 (the same - unrelated to MPN)  -Itching (pruritis): 2 (down from 9-10, much improved)  -Bone pain (diffuse not joint pain or arthritis): 3 (may be related to OA vs MPN - the same)  -Fever (>100 F): 0 (non-issue)  -Unintentional weight loss last 6 months: 0 (non-issue)  What is your overall quality of life? (0 = as bad as it can be; 10 = as good as it can be): 5 (too soon to tell any changes)    On labs from 3/16, her Hgb 11.5, PLT 413, and ANC 2.5 - all stable and WNL. LDH decreased to 268. No CMP drawn to assess LFTs.    Oncologic History: Oncology History    No history exists.       Weight and Vitals:  Wt Readings from Last 3 Encounters:   03/15/19 58.9 kg (129 lb 12.8 oz)   12/31/17 58.5 kg (128 lb 15.5 oz)   02/22/18 59.2 kg (130 lb 9.6 oz)     Temp Readings from Last 3 Encounters:   03/15/19 36.7 ??C (98 ??F) (Temporal)   02/22/18 36.5 ??C (97.7 ??F) (Oral)     BP Readings from Last 3 Encounters:   03/15/19 131/64   02/22/18 128/70     Pulse Readings from Last 3 Encounters:   03/15/19 81   02/22/18 71       Pertinent Labs:  No visits with results within 1 Day(s) from this visit.   Latest known visit with results is:   Telemedicine on 04/05/2019   Component Date Value Ref Range Status   ??? WBC 04/25/2019 5.6  3.4 - 10.8 x10E3/uL Final   ??? RBC 04/25/2019 3.18* 3.77 - 5.28 x10E6/uL Final   ??? HGB 04/25/2019 11.5  11.1 - 15.9 g/dL Final   ??? HCT 09/81/1914 33.5* 34.0 - 46.6 % Final   ??? MCV 04/25/2019 105* 79 - 97 fL Final   ??? MCH 04/25/2019 36.2* 26.6 - 33.0 pg Final   ??? MCHC 04/25/2019 34.3  31.5 - 35.7 g/dL Final   ??? RDW 09/81/1914 12.4  11.7 - 15.4 % Final   ??? Platelet 04/25/2019 413  150 - 450 x10E3/uL Final   ??? Neutrophils % 04/25/2019 45  Not Estab. % Final   ??? Lymphocytes % 04/25/2019 45  Not Estab. % Final   ??? Monocytes % 04/25/2019 8  Not Estab. % Final   ??? Eosinophils % 04/25/2019 1  Not Estab. % Final   ??? Basophils % 04/25/2019 1  Not Estab. % Final   ??? Absolute Neutrophils 04/25/2019 2.5  1.4 - 7.0 x10E3/uL Final   ??? Absolute Lymphocytes 04/25/2019 2.5  0.7 - 3.1 x10E3/uL Final   ??? Absolute Monocytes  04/25/2019 0.4  0.1 - 0.9 x10E3/uL Final   ??? Absolute Eosinophils 04/25/2019 0.1  0.0 - 0.4 x10E3/uL Final   ??? Absolute Basophils  04/25/2019 0.0  0.0 - 0.2 x10E3/uL Final   ??? Immature Granulocytes 04/25/2019 0  Not Estab. % Final   ??? Bands Absolute 04/25/2019 0.0  0.0 - 0.1 x10E3/uL Final   ??? LDH 04/25/2019 269* 119 - 226 IU/L Final       Allergies: No Known Allergies    Drug Interactions: none      Current Medications:  Current Outpatient Medications   Medication Sig Dispense Refill   ??? aspirin (ECOTRIN) 81 MG tablet Take 81 mg by mouth.     ??? atorvastatin (LIPITOR) 40 MG tablet Take 1 tablet (40 mg total) by mouth daily. 30 tablet 11   ??? cetirizine (ZYRTEC) 10 MG tablet Take 10 mg by mouth.     ??? cyanocobalamin 1000 MCG tablet Take 1,000 mcg by mouth.     ??? docusate sodium (COLACE) 100 MG capsule Take 100 mg by mouth daily as needed.      ??? meTHIMazole (TAPAZOLE) 5 MG tablet Take 0.5 tablets (2.5 mg total) by mouth daily. 90 tablet 1   ??? multivitamin (MULTIVITAMIN) per tablet Take 1 tablet by mouth daily.      ??? omega-3 acid ethyl esters (LOVAZA) 1 gram capsule Take 1 g by mouth.     ??? omeprazole (PRILOSEC) 20 MG capsule TAKE 1 CAPSULE(20 MG) BY MOUTH DAILY 90 capsule 0   ??? ondansetron (ZOFRAN) 4 MG tablet Take 1 tablet (4 mg total) by mouth daily as needed for nausea. 30 tablet 2   ??? PARoxetine (PAXIL) 20 MG tablet Take 1 tablet (20 mg total) by mouth every morning. 90 tablet 1   ??? raloxifene (EVISTA) 60 mg tablet Take 1 tablet (60 mg total) by mouth daily. 90 tablet 3   ??? ruxolitinib (JAKAFI) 10 mg tablet Take 1 tablet (10 mg total) by mouth Two (2) times a day. 60 tablet 3     No current facility-administered medications for this visit.        Adherence: Sometimes forgets (missing a dose about every 2 weeks - not used to taking something BID but getting better), uses pillbox      Assessment: Christy Buchanan is a 64 y.o. female with MPN being treated currently with Jakafi 10 mg BID. Based on platelets > 200, can increase dose. She appears to have some early benefit from [facility], particularly improvement of itching.     Plan:   - Increase Jakafi to 20 mg BID. I sent updated Rx to [facility] [place]. Can use 10 mg tabs for now.  - Prior to visit with MD on 4/21, please go get labs. I have asked these to be arranged  to get CMP and CBCd.   - Add miralax for constipation, continue zofran prn.   - Recommend simethicone for gas bubbles    F/u:  Future Appointments   Date Time Provider Department Center   05/02/2019  2:00 PM Malva Cogan, CPP [id number]   05/31/2019 11:20 AM Halford Decamp, MD [id number]       Manfred Arch, PharmD, BCOP, CPP  Pager: [phone]    I spent 25 minutes on the phone with the patient on the date of service. I spent an additional 10 minutes on pre- and post-visit activities on the date of service.     The patient was physically located in [place] or a state in which I am permitted to provide care. The patient and/or parent/guardian understood that s/he may incur co-pays and cost sharing, and agreed to the telemedicine visit. The visit was reasonable and appropriate under the circumstances given the patient's presentation at the time.    The patient and/or parent/guardian has been advised of the potential risks and limitations of this mode of treatment (including, but not limited to, the absence of in-person examination) and has agreed to be treated using telemedicine. The patient's/patient's family's questions regarding telemedicine have been answered.     If the visit was completed in an ambulatory setting, the patient and/or parent/guardian has also been advised to contact their provider???s office for worsening conditions, and seek emergency medical treatment and/or call 911 if the patient deems either necessary.

## 2019-05-03 DIAGNOSIS — D473 Essential (hemorrhagic) thrombocythemia: Principal | ICD-10-CM

## 2019-05-03 NOTE — Unmapped (Signed)
Jakafi dose increase  Last filled 1 day ago  Copay = $0

## 2019-05-03 NOTE — Unmapped (Signed)
Opened in error

## 2019-05-03 NOTE — Unmapped (Addendum)
Lab orders entered for electronic submission to [facility]. These are to be done ~4/19 prior to video visit with Dr. [name] on 4/21.

## 2019-05-04 NOTE — Unmapped (Signed)
[facility] Clinical Assessment & Refill Coordination Note    Christy Buchanan, DOB: 03-Nov-1955  Phone: [phone] (home)     All above HIPAA information was verified with patient.     Was a Nurse, learning disability used for this call? No    Specialty Medication(s):   Hematology/Oncology: [id number]     Current Outpatient Medications   Medication Sig Dispense Refill   ??? aspirin (ECOTRIN) 81 MG tablet Take 81 mg by mouth.     ??? atorvastatin (LIPITOR) 40 MG tablet Take 1 tablet (40 mg total) by mouth daily. 30 tablet 11   ??? cetirizine (ZYRTEC) 10 MG tablet Take 10 mg by mouth every other day.      ??? cyanocobalamin 1000 MCG tablet Take 1,000 mcg by mouth.     ??? docusate sodium (COLACE) 100 MG capsule Take 100 mg by mouth daily as needed.      ??? meTHIMazole (TAPAZOLE) 5 MG tablet Take 0.5 tablets (2.5 mg total) by mouth daily. 90 tablet 1   ??? multivitamin (MULTIVITAMIN) per tablet Take 1 tablet by mouth daily.      ??? omega-3 acid ethyl esters (LOVAZA) 1 gram capsule Take 1 g by mouth.     ??? omeprazole (PRILOSEC) 20 MG capsule TAKE 1 CAPSULE(20 MG) BY MOUTH DAILY 90 capsule 0   ??? ondansetron (ZOFRAN) 4 MG tablet Take 1 tablet (4 mg total) by mouth daily as needed for nausea. 30 tablet 2   ??? PARoxetine (PAXIL) 20 MG tablet Take 1 tablet (20 mg total) by mouth every morning. 90 tablet 1   ??? polyethylene glycol (MIRALAX) 17 gram packet Take 17 g by mouth daily.     ??? raloxifene (EVISTA) 60 mg tablet Take 1 tablet (60 mg total) by mouth daily. 90 tablet 3   ??? ruxolitinib (JAKAFI) 20 mg tablet Take 1 tablet (20 mg total) by mouth Two (2) times a day. 60 tablet 3   ??? simethicone (MYLICON) 80 MG chewable tablet Chew 80 mg every six (6) hours as needed.       No current facility-administered medications for this visit.         Changes to medications: Andilyn reports no changes at this time.    No Known Allergies    Changes to allergies: No    SPECIALTY MEDICATION ADHERENCE     Jakafi 10 mg: 6 days of medicine on hand   Jakafi 20 mg: 0 days of medicine on hand       Medication Adherence    Patient reported X missed doses in the last month: 0  Specialty Medication: Jakafi 10 mg  Patient is on additional specialty medications: No  Informant: patient  Confirmed plan for next specialty medication refill: delivery by pharmacy  Refills needed for supportive medications: not needed          Specialty medication(s) dose(s) confirmed: Patient reports changes to the regimen as follows: increased dose to 20 mg twice daily     Are there any concerns with adherence? No    Adherence counseling provided? Not needed    CLINICAL MANAGEMENT AND INTERVENTION      Clinical Benefit Assessment:    Do you feel the medicine is effective or helping your condition? Yes    Clinical Benefit counseling provided? Labs from 05/02/19 show evidence of clinical benefit    Adverse Effects Assessment:    Are you experiencing any side effects? No    Are you experiencing difficulty  administering your medicine? No    Quality of Life Assessment:    How many days over the past month did your condition  keep you from your normal activities? For example, brushing your teeth or getting up in the morning. 0    Have you discussed this with your provider? Not needed    Therapy Appropriateness:    Is therapy appropriate? Yes, therapy is appropriate and should be continued    DISEASE/MEDICATION-SPECIFIC INFORMATION      N/A    PATIENT SPECIFIC NEEDS     ? Does the patient have any physical, cognitive, or cultural barriers? No    ? Is the patient high risk? Yes, patient is taking oral chemotherapy. Appropriateness of therapy as been assessed.     ? Does the patient require a Care Management Plan? No     ? Does the patient require physician intervention or other additional services (i.e. nutrition, smoking cessation, social work)? No      SHIPPING     Specialty Medication(s) to be Shipped:   Hematology/Oncology: Jakafi    Other medication(s) to be shipped: n/a     Changes to insurance: No    Delivery Scheduled: Yes, Expected medication delivery date: 05/05/19.     Medication will be delivered via Same Day Courier to the confirmed prescription address in [facility].    The patient will receive a drug information handout for each medication shipped and additional FDA Medication Guides as required.  Verified that patient has previously received a Conservation officer, historic buildings.    All of the patient's questions and concerns have been addressed.    Finnleigh Marchetti Vangie Bicker   [facility] Specialty Pharmacist

## 2019-05-05 MED FILL — JAKAFI 20 MG TABLET: 30 days supply | Qty: 60 | Fill #0 | Status: AC

## 2019-05-16 NOTE — Unmapped (Signed)
[facility] Refill Coordination Note    Specialty Medication(s) to be Shipped:   Hematology/Oncology: Christy Buchanan    Other medication(s) to be shipped: n/a     Christy Buchanan, DOB: 05/17/1955  Phone: [phone] (home)       All above HIPAA information was verified with patient.     Was a Nurse, learning disability used for this call? No    Completed refill call assessment today to schedule patient's medication shipment from the [facility] [phone]).       Specialty medication(s) and dose(s) confirmed: Regimen is correct and unchanged.   Changes to medications: [name] reports no changes at this time.  Changes to insurance: No  Questions for the pharmacist: No    Confirmed patient received Welcome Packet with first shipment. The patient will receive a drug information handout for each medication shipped and additional FDA Medication Guides as required.       DISEASE/MEDICATION-SPECIFIC INFORMATION        N/A    SPECIALTY MEDICATION ADHERENCE     Medication Adherence    Patient reported X missed doses in the last month: 0  Specialty Medication: Jakafi 20mg   Patient is on additional specialty medications: No  Informant: patient                Jakafi 20 mg: 22 days of medicine on hand         SHIPPING     Shipping address confirmed in Epic.     Delivery Scheduled: Yes, Expected medication delivery date: 06/02/19.     Medication will be delivered via Next Day Courier to the prescription address in Epic [place].    Christy Buchanan Christy Buchanan   [facility] Specialty Technician

## 2019-05-25 LAB — COMPREHENSIVE METABOLIC PANEL
ALBUMIN: 4.3 g/dL (ref 3.8–4.8)
ALKALINE PHOSPHATASE: 102 IU/L (ref 39–117)
ALT (SGPT): 16 IU/L (ref 0–32)
AST (SGOT): 21 IU/L (ref 0–40)
BILIRUBIN TOTAL: 0.3 mg/dL (ref 0.0–1.2)
BLOOD UREA NITROGEN: 20 mg/dL (ref 8–27)
BUN / CREAT RATIO: 22 (ref 12–28)
CALCIUM: 8.9 mg/dL (ref 8.7–10.3)
CHLORIDE: 109 mmol/L — ABNORMAL HIGH (ref 96–106)
CO2: 22 mmol/L (ref 20–29)
CREATININE: 0.91 mg/dL (ref 0.57–1.00)
GFR MDRD AF AMER: 77 mL/min/{1.73_m2}
GFR MDRD NON AF AMER: 66 mL/min/{1.73_m2}
GLOBULIN, TOTAL: 1.9 g/dL (ref 1.5–4.5)
GLUCOSE: 99 mg/dL (ref 65–99)
POTASSIUM: 4.8 mmol/L (ref 3.5–5.2)
SODIUM: 143 mmol/L (ref 134–144)
TOTAL PROTEIN: 6.2 g/dL (ref 6.0–8.5)

## 2019-05-25 LAB — CBC W/ DIFFERENTIAL
BANDED NEUTROPHILS ABSOLUTE COUNT: 0 10*3/uL (ref 0.0–0.1)
BASOPHILS RELATIVE PERCENT: 0 %
EOSINOPHILS ABSOLUTE COUNT: 0.1 10*3/uL (ref 0.0–0.4)
EOSINOPHILS RELATIVE PERCENT: 1 %
HEMATOCRIT: 30.2 % — ABNORMAL LOW (ref 34.0–46.6)
HEMOGLOBIN: 10.6 g/dL — ABNORMAL LOW (ref 11.1–15.9)
IMMATURE GRANULOCYTES: 1 %
LYMPHOCYTES RELATIVE PERCENT: 50 %
MEAN CORPUSCULAR HEMOGLOBIN CONC: 35.1 g/dL (ref 31.5–35.7)
MEAN CORPUSCULAR HEMOGLOBIN: 35.7 pg — ABNORMAL HIGH (ref 26.6–33.0)
MEAN CORPUSCULAR VOLUME: 102 fL — ABNORMAL HIGH (ref 79–97)
MONOCYTES ABSOLUTE COUNT: 0.5 10*3/uL (ref 0.1–0.9)
MONOCYTES RELATIVE PERCENT: 8 %
NEUTROPHILS ABSOLUTE COUNT: 2.2 10*3/uL (ref 1.4–7.0)
NEUTROPHILS RELATIVE PERCENT: 40 %
PLATELET COUNT: 393 10*3/uL (ref 150–450)
RED BLOOD CELL COUNT: 2.97 x10E6/uL — ABNORMAL LOW (ref 3.77–5.28)
RED CELL DISTRIBUTION WIDTH: 11.8 % (ref 11.7–15.4)
WHITE BLOOD CELL COUNT: 5.5 10*3/uL (ref 3.4–10.8)

## 2019-05-25 LAB — EOSINOPHILS ABSOLUTE COUNT: Eosinophils:NCnc:Pt:Bld:Qn:Automated count: 0.1

## 2019-05-25 LAB — POTASSIUM: Potassium:SCnc:Pt:Ser/Plas:Qn:: 4.8

## 2019-05-25 LAB — LACTATE DEHYDROGENASE: Lactate dehydrogenase:CCnc:Pt:Ser/Plas:Qn:: 293 — ABNORMAL HIGH

## 2019-05-27 DIAGNOSIS — Z853 Personal history of malignant neoplasm of breast: Principal | ICD-10-CM

## 2019-05-29 MED ORDER — RALOXIFENE 60 MG TABLET
ORAL_TABLET | Freq: Every day | ORAL | 3 refills | 90.00000 days | Status: CP
Start: 2019-05-29 — End: ?

## 2019-05-29 NOTE — Unmapped (Signed)
Kaitlyn,     I received a refill request for raloxifene and would ask for your direction since the patient is followed at the cancer center.    Thank you, Jacquelin Hawking

## 2019-05-31 ENCOUNTER — Telehealth
Admit: 2019-05-31 | Discharge: 2019-06-01 | Payer: MEDICARE | Attending: Hematology & Oncology | Primary: Hematology & Oncology

## 2019-05-31 NOTE — Unmapped (Signed)
Pt confirmed VIDEO visit and meds  Pt is at home and Provider is On campus

## 2019-05-31 NOTE — Unmapped (Signed)
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  Of note, she has no circulating immature or CD34+ cells, no increase in fibrosis, a normal LDH, and no palpable splenomegaly.  Thus, we can rule out the pre-fibrotic phase of MF, which is in the most likely alternative diagnosis.      She has an intermediate risk for thrombosis by IPSET1 and high risk by IPSET2.  Dailey aspirin and cytoreduction are appropriate.  Of note, CV risk factors for this calculation include diabetes, hypertension, and active smoking.  She has not smoked in 10 years; her only other risk factor is hyperlipidemia, which is reasonably well controlled.  Even without CV risk factors, she is still considered high risk by the IPSET2.      Given her MPN symptom score of has dropped to 27, which is encouraging.  We are controlling her counts well.  However, I am concerned about the anemia that she is developing. This is likely a side effect of ruxolitinib. I would reduce her drug from 20 mg to 15 mg BID.     We discussed the possibility of using Cymbalta for arthritis pain. She was encouraged to talk with her PCP about this treatment.  I can also send her some information about dietary changes that may be helpful in dealing with gassiness.      PLAN:  1) Reduce ruxolitinib to 15 mg twice a day  2) I will send her a message on reducing gas-forming foods  3) She was encouraged to talk with her PCP about Cymbalta  4) RTC in the first week of June before going to [place].      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  ?? CBC: 7.3/12.0/312  ?? CD34: 0.02  ?? VWF: Normal   ?? MPN score: 48 to 49  ?? Began ruxolitinib 10 mg BID    04/05/19  ?? CBC: 7.3/12.0/312  ?? MPN score: 36 - 38  ?? Ruxolitinib 10 mg BID; Hydrea was stopped     05/17/19: Ruxolitinib 20 mg BID    05/31/19 Ruxolitinib 15 mg BID (decrease due to anemia of 10.6)    DISCLAIMER: I spent 22 minutes on the real-time audio and video with the patient on the date of service. I spent an additional 18 minutes on pre- and post-visit activities.     The patient was physically located in [place] or a state in which I am permitted to provide care. The patient and/or parent/guardian understood that s/he may incur co-pays and cost sharing, and agreed to the telemedicine visit. The visit was reasonable and appropriate under the circumstances given the patient's presentation at the time.    The patient and/or parent/guardian has been advised of the potential risks and limitations of this mode of treatment (including, but not limited to, the absence of in-person examination) and has agreed  to be treated using telemedicine. The patient's/patient's family's questions regarding telemedicine have been answered.     If the visit was completed in an ambulatory setting, the patient and/or parent/guardian has also been advised to contact their provider???s office for worsening conditions, and seek emergency medical treatment and/or call 911 if the patient deems either necessary.    INTERVAL HX:  Christy Buchanan participated in a video visit.  She is currently undergoing treatment with ruxolitinib.     Overall, she feels she is doing well.  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  This was not accompanied by CP or rapid heart rate.  She has had no signs of infection. She has noticed some GI upset with her treatment.  This was described as a gassiness.     MPN 10 Score  Symptom (Absent 0 to 10 worst imaginable) 2/03 2/24 4/21        Fatigue in the past 24 hours 6 9 7         Filling up quickly when you eat (Early satiety)  7 7 5         Abdominal discomfort  3 4 4         Inactivity 5 5 - 6 5        Problems with concentration - 6 7-8 6        Numbness/ Tingling (in my hands and feet) 5 to 6 0 0        Night sweats 0 0 1        Itching (pruritus) 10 7 0        Bone pain (diffuse not joint pain or arthritis) 6 6 3         Fever (>100 F) 0 0 0        Unintentional weight loss last 6 months 0 0 0        TOTAL 48 - 49 36 - 38 27          PMHx  ??? 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 no malignancy; neg LN  o Txed with Surgery, radiation, auto BMT, raloxifene  o Negative WU  for 28 genes associated with hereditary cancers  o Raloxifene  ??? Sjogren's syndrome  ??? Autoimmune thyroiditis - Txed with methimazole   ??? GERD - Txed with PPI  ??? Mixed hyperlipidemia - Tx with Lovaza, pravastatin     FHx:  ?? Daughter w/ spina bifida, BRCA 1081G>A mutation (w/o cancer)      2016 WHO Diagnostic Criteria for MPNs  ET  Major Criteria  ??? Plts > 450  ??? BM biopsy showing proliferation mainly of the megakaryocyte lineage with increased numbers of enlarged, mature megakaryocytes with hyperlobulated nuclei. No significant left-shift of neutrophil granulopoiesis or erythropoiesis and very rarely minor (grade 1) increase in reticulin fibers  ??? Not meeting WHO criteria for BCR-ABL1?+?CML, PV, PMF, MDS, or other myeloid neoplasms)  ??? Presence of JAK2, CALR or MPL mutation    Minor Criteria  ??? Presence of a clonal marker (e.g., abnormal karyotype)  ??? Absence of evidence for reactive thrombocytosis    Diagnosis: All major criteria or first three major and one minor     PMF  Major criteria  ??? Proliferation and atypia of megakaryocytes accompanied by either reticulin and/or collagen fibrosis  grades 2 or 3 on a scale of 0 to 3  ??? Not  meeting WHO criteria for ET, PV, BCR-ABL1+ CML, myelodysplastic syndromes, or other myeloid neoplasm  ??? Presence of JAK2, CALR or MPL mutation or in the absence of these mutations, presence of another clonal marker or absence of reactive myelofibrosis    Minor Criteria  ??? Anemia not attributed to another comorbid condition  ??? WBC > 11.0  ??? Palpable Splenomegaly   ??? LDH > ULN  ??? Leukoerythroblastosis     Diagnosis: 3 major and at least 1 minor confirmed in two consecutive determinations    IPSET Model for Prediction of Thrombosis    Age:    2   < 60: 0   > 60: 2  WBC count (x10e9)  0   < 11: 0   > 11: 1  Hx of Thrombosis  0   No: 0   Yes: 1  --------------------------------------------  Total:    2     10 yr thrombosis risk, median survival (yrs)  Lo Risk: 0 pts    --> 89%, NR  Int Risk: 1-2 pts --> 86%, 24.5  Hi Risk: 3-4 pts  --> 69%, 14.7    IPSET II  Age:    1   < 60: 0   > 60: 1  CVS Risk Factor*  0   No: 0   Yes: 1   Hx of Thrombosis  0   No: 0   Yes: 2  JAK2     2   No: 0   Yes: 2  --------------------------------------------  Total:    3    *Gave up smoking 10 years ago, near optimal non-LDL on treatment, non HTN    Annual Thrombosis Risk  Lo Risk: 0-1 pts  --> 1.03%  Int Risk: 2 pts     --> 2.35%  Hi Risk: 3-6 pts  --> 3.56%     NOTE  ?? JAK2 conveys a higher risk of thrombosis  ?? CALR conveys a higher risk of bleeding    Newer Model:  [email or web address]    Primary prophylaxis of thrombosis  All patients: Aspirin unless   ?????? VWF activity <30%,  ?????? Platelet >?1 million,  ?????? CALR-mutated low-risk ET    PV with Hct >?45%: Add phlebotomy/cytoreduction to target Hct <?45%  Age >?60 years and/or prior history of thrombosis: Add cytoreduction  Secondary prophylaxis after thrombotic event  ?All patients: Cytoreduction  ?Typical VTE: Consider indefinite VKA for most patients; aspirin if not on VKA  ?Atypical VTE: Indefinite VKA  ?Arterial thrombosis: Aspirin

## 2019-06-01 DIAGNOSIS — E063 Autoimmune thyroiditis: Principal | ICD-10-CM

## 2019-06-01 MED ORDER — METHIMAZOLE 5 MG TABLET
ORAL_TABLET | 0 refills | 0 days | Status: CP
Start: 2019-06-01 — End: ?

## 2019-06-01 MED FILL — JAKAFI 20 MG TABLET: 30 days supply | Qty: 60 | Fill #1 | Status: AC

## 2019-06-01 MED FILL — JAKAFI 20 MG TABLET: ORAL | 30 days supply | Qty: 60 | Fill #1

## 2019-06-01 NOTE — Unmapped (Signed)
LVM in regards to rescheduling the missed appt with [name] on 4/21

## 2019-06-14 DIAGNOSIS — F325 Major depressive disorder, single episode, in full remission: Principal | ICD-10-CM

## 2019-06-14 MED ORDER — PAROXETINE 20 MG TABLET
ORAL_TABLET | 0 refills | 0 days | Status: CP
Start: 2019-06-14 — End: ?

## 2019-06-21 DIAGNOSIS — D473 Essential (hemorrhagic) thrombocythemia: Principal | ICD-10-CM

## 2019-06-21 DIAGNOSIS — D471 Chronic myeloproliferative disease: Principal | ICD-10-CM

## 2019-06-21 MED ORDER — RUXOLITINIB 15 MG TABLET
ORAL_TABLET | Freq: Two times a day (BID) | ORAL | 0 refills | 90 days | Status: CP
Start: 2019-06-21 — End: ?
  Filled 2019-06-27: qty 60, 30d supply, fill #0

## 2019-06-21 MED ORDER — RUXOLITINIB 20 MG TABLET
ORAL_TABLET | Freq: Two times a day (BID) | ORAL | 3 refills | 30.00000 days | Status: CP
Start: 2019-06-21 — End: 2019-07-21

## 2019-06-21 NOTE — Unmapped (Signed)
[facility] Refill Coordination Note    Specialty Medication(s) to be Shipped:   Hematology/Oncology: [name]    Other medication(s) to be shipped: n/a     Calton Golds, DOB: [birth year]  Phone: [phone] (home)       All above HIPAA information was verified with patient.     Was a Nurse, learning disability used for this call? No    Completed refill call assessment today to schedule patient's medication shipment from the [facility] [phone]).       Specialty medication(s) and dose(s) confirmed: Pt states she's now taking 15mg  BID   Changes to medications: Noela reports no changes at this time.  Changes to insurance: No  Questions for the pharmacist: No    Confirmed patient received Welcome Packet with first shipment. The patient will receive a drug information handout for each medication shipped and additional FDA Medication Guides as required.       DISEASE/MEDICATION-SPECIFIC INFORMATION        N/A    SPECIALTY MEDICATION ADHERENCE     Medication Adherence    Patient reported X missed doses in the last month: 1  Specialty Medication: Jakafi 20mg   Patient is on additional specialty medications: No  Informant: patient                Jakafi 20 mg: 10 days of medicine on hand         SHIPPING     Shipping address confirmed in Epic.     Delivery Scheduled: Yes, Expected medication delivery date: 06/27/19.  However, Rx request for refills was sent to the provider as there are none remaining.     Medication will be delivered via Same Day Courier to the prescription address in Epic [place].    [name]   [facility] Specialty Technician

## 2019-06-27 MED FILL — JAKAFI 15 MG TABLET: 30 days supply | Qty: 60 | Fill #0 | Status: AC

## 2019-07-11 DIAGNOSIS — K219 Gastro-esophageal reflux disease without esophagitis: Principal | ICD-10-CM

## 2019-07-11 DIAGNOSIS — F325 Major depressive disorder, single episode, in full remission: Principal | ICD-10-CM

## 2019-07-11 MED ORDER — PAROXETINE 20 MG TABLET
ORAL_TABLET | 0 refills | 0 days
Start: 2019-07-11 — End: ?

## 2019-07-11 MED ORDER — OMEPRAZOLE 20 MG CAPSULE,DELAYED RELEASE
ORAL_CAPSULE | 0 refills | 0 days
Start: 2019-07-11 — End: ?

## 2019-07-12 ENCOUNTER — Other Ambulatory Visit: Admit: 2019-07-12 | Discharge: 2019-07-13 | Payer: MEDICARE

## 2019-07-12 ENCOUNTER — Ambulatory Visit
Admit: 2019-07-12 | Discharge: 2019-07-13 | Payer: MEDICARE | Attending: Hematology & Oncology | Primary: Hematology & Oncology

## 2019-07-12 DIAGNOSIS — D473 Essential (hemorrhagic) thrombocythemia: Principal | ICD-10-CM

## 2019-07-12 LAB — CD34*: Lab: 0.12

## 2019-07-12 LAB — COMPREHENSIVE METABOLIC PANEL
ALKALINE PHOSPHATASE: 78 U/L (ref 38–126)
ALT (SGPT): 21 U/L (ref <35)
ANION GAP: 9 mmol/L (ref 7–15)
AST (SGOT): 28 U/L (ref 14–38)
BILIRUBIN TOTAL: 0.5 mg/dL (ref 0.0–1.2)
BLOOD UREA NITROGEN: 19 mg/dL (ref 7–21)
BUN / CREAT RATIO: 22
CALCIUM: 9.4 mg/dL (ref 8.5–10.2)
CHLORIDE: 107 mmol/L (ref 98–107)
CO2: 26 mmol/L (ref 22.0–30.0)
CREATININE: 0.88 mg/dL (ref 0.60–1.00)
EGFR CKD-EPI AA FEMALE: 80 mL/min/{1.73_m2} (ref >=60)
EGFR CKD-EPI NON-AA FEMALE: 69 mL/min/{1.73_m2} (ref >=60)
GLUCOSE RANDOM: 82 mg/dL (ref 70–179)
POTASSIUM: 4.5 mmol/L (ref 3.5–5.0)
PROTEIN TOTAL: 6.9 g/dL (ref 6.5–8.3)
SODIUM: 142 mmol/L (ref 135–145)

## 2019-07-12 LAB — CBC W/ AUTO DIFF
BASOPHILS ABSOLUTE COUNT: 0 10*9/L (ref 0.0–0.1)
BASOPHILS RELATIVE PERCENT: 0.5 %
EOSINOPHILS ABSOLUTE COUNT: 0.1 10*9/L (ref 0.0–0.4)
EOSINOPHILS RELATIVE PERCENT: 1.1 %
HEMATOCRIT: 31.6 % — ABNORMAL LOW (ref 36.0–46.0)
HEMOGLOBIN: 10.4 g/dL — ABNORMAL LOW (ref 12.0–16.0)
LARGE UNSTAINED CELLS: 4 % (ref 0–4)
LYMPHOCYTES ABSOLUTE COUNT: 2.8 10*9/L (ref 1.5–5.0)
LYMPHOCYTES RELATIVE PERCENT: 43.4 %
MEAN CORPUSCULAR HEMOGLOBIN CONC: 33 g/dL (ref 31.0–37.0)
MEAN CORPUSCULAR HEMOGLOBIN: 33.7 pg (ref 26.0–34.0)
MEAN CORPUSCULAR VOLUME: 102.1 fL — ABNORMAL HIGH (ref 80.0–100.0)
MEAN PLATELET VOLUME: 7.5 fL (ref 7.0–10.0)
MONOCYTES RELATIVE PERCENT: 7 %
NEUTROPHILS RELATIVE PERCENT: 44.5 %
PLATELET COUNT: 477 10*9/L — ABNORMAL HIGH (ref 150–440)
RED BLOOD CELL COUNT: 3.1 10*12/L — ABNORMAL LOW (ref 4.00–5.20)
RED CELL DISTRIBUTION WIDTH: 13.1 % (ref 12.0–15.0)
WBC ADJUSTED: 6.4 10*9/L (ref 4.5–11.0)

## 2019-07-12 LAB — AST (SGOT): Aspartate aminotransferase:CCnc:Pt:Ser/Plas:Qn:: 28

## 2019-07-12 LAB — LACTATE DEHYDROGENASE: Lactate dehydrogenase:CCnc:Pt:Ser/Plas:Qn:Reaction: pyruvate to lactate: 866 — ABNORMAL HIGH

## 2019-07-12 LAB — LYMPHOCYTES RELATIVE PERCENT: Lymphocytes/100 leukocytes:NFr:Pt:Bld:Qn:Automated count: 43.4

## 2019-07-12 LAB — CD34 ENUMERATION
CD34*: 0.12 %
PRE-APHERESIS 34: 8 {cells}/uL

## 2019-07-12 MED ORDER — RUXOLITINIB 10 MG TABLET
ORAL_TABLET | Freq: Two times a day (BID) | ORAL | 2 refills | 30 days | Status: CP
Start: 2019-07-12 — End: ?
  Filled 2019-07-17: qty 60, 30d supply, fill #0

## 2019-07-12 MED ORDER — HYDROXYUREA 500 MG CAPSULE
ORAL_CAPSULE | Freq: Every day | ORAL | 2 refills | 30 days | Status: CP
Start: 2019-07-12 — End: 2020-07-11
  Filled 2019-07-17: qty 90, 90d supply, fill #0

## 2019-07-12 NOTE — Unmapped (Addendum)
PLAN:  1) Decrease to 10 mg of Jakafi twice a day.  This will continue to take care of itching   2) Add Hydrea (500 mg)   3) Get labs thru [facility]     LDH is an enzyme that is found in almost every cell. The blood test increases with cell death and cellular proliferation. In myelofibrosis, progression of disease will make increase.     Your problem: Fatigue   - Sources:     - Sleep: You are denying yourself REM.  Nocturia can be addressed.     - Anemia: Technically, this is not low enough to be a problem .Marland Kitchen... that's technically.     - LDH: This is harder. I am not ready to declare you as progressing.     - Solutions    - Sleep hygiene, avoiding liquids, and may need medication. (Keep a log of ins and outs)     - Anemia     - Increase the hemoglobin: Aranesp, Danazol (pill)     - Decrease the Jakafi and add Hydrea.      - Switch to thalidomide and prednisone.      - Momelitinib - this is not yet available.     All lab results last 24 hours:    Recent Results (from the past 24 hour(s))   Lactate dehydrogenase    Collection Time: 07/12/19  2:26 PM   Result Value Ref Range    LDH 866 (H) 338 - 610 U/L   Comprehensive Metabolic Panel    Collection Time: 07/12/19  2:26 PM   Result Value Ref Range    Sodium 142 135 - 145 mmol/L    Potassium 4.5 3.5 - 5.0 mmol/L    Chloride 107 98 - 107 mmol/L    Anion Gap 9 7 - 15 mmol/L    CO2 26.0 22.0 - 30.0 mmol/L    BUN 19 7 - 21 mg/dL    Creatinine 1.61 0.96 - 1.00 mg/dL    BUN/Creatinine Ratio 22     EGFR CKD-EPI Non-African American, Female 69 >=60 mL/min/1.79m2    EGFR CKD-EPI African American, Female 80 >=60 mL/min/1.52m2    Glucose 82 70 - 179 mg/dL    Calcium 9.4 8.5 - 04.5 mg/dL    Albumin 4.4 3.5 - 5.0 g/dL    Total Protein 6.9 6.5 - 8.3 g/dL    Total Bilirubin 0.5 0.0 - 1.2 mg/dL    AST 28 14 - 38 U/L    ALT 21 <35 U/L    Alkaline Phosphatase 78 38 - 126 U/L   CBC w/ Differential    Collection Time: 07/12/19  2:26 PM   Result Value Ref Range    WBC 6.4 4.5 - 11.0 10*9/L RBC 3.10 (L) 4.00 - 5.20 10*12/L    HGB 10.4 (L) 12.0 - 16.0 g/dL    HCT 40.9 (L) 81.1 - 46.0 %    MCV 102.1 (H) 80.0 - 100.0 fL    MCH 33.7 26.0 - 34.0 pg    MCHC 33.0 31.0 - 37.0 g/dL    RDW 91.4 78.2 - 95.6 %    MPV 7.5 7.0 - 10.0 fL    Platelet 477 (H) 150 - 440 10*9/L    Neutrophils % 44.5 %    Lymphocytes % 43.4 %    Monocytes % 7.0 %    Eosinophils % 1.1 %    Basophils % 0.5 %    Neutrophil Left Shift 1+ (A) Not  Present    Absolute Neutrophils 2.9 2.0 - 7.5 10*9/L    Absolute Lymphocytes 2.8 1.5 - 5.0 10*9/L    Absolute Monocytes 0.5 0.2 - 0.8 10*9/L    Absolute Eosinophils 0.1 0.0 - 0.4 10*9/L    Absolute Basophils 0.0 0.0 - 0.1 10*9/L    Large Unstained Cells 4 0 - 4 %    Macrocytosis Moderate (A) Not Present

## 2019-07-12 NOTE — Unmapped (Signed)
PLAN:  1) Decrease to 10 mg of Jakafi twice a day.  This will continue to take care of itching   2) Add Hydrea (500 mg)   3) Get labs thru [facility]   4) Sleep hygiene, avoiding liquids in the evening.  She was encouraged to keep a sleep log.     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  Of note, she has no circulating immature or CD34+ cells, no increase in fibrosis, a normal LDH, and no palpable splenomegaly.  Thus, we can rule out the pre-fibrotic phase of MF, which is in the most likely alternative diagnosis.      She has an intermediate risk for thrombosis by IPSET1 and high risk by IPSET2.  Dailey aspirin and cytoreduction are appropriate.  Of note, CV risk factors for this calculation include diabetes, hypertension, and active smoking.  She has not smoked in 10 years; her only other risk factor is hyperlipidemia, which is reasonably well controlled.  Even without CV risk factors, she is still considered high risk by the IPSET2.      She was started on ruxolitinib to deal with her elevated platelets and her significant pruritus.  This drug was quite effective in dealing with both.  Unfortunately, this has led to anemia.  It is not clear if her anemia is symptomatic.  She does note significant fatigue, which has increased since starting ruxolitinib.  Our options for dealing with this problem are as follows:   ?? Decrease ruxolitinib and add hydroxyurea.  This is a bit unusual, but it is possible that a lower dose of ruxolitnib would control her symptoms and allow for recovery of her anemia.    ?? DC ruxolitinib, add hydroxyurea, and add another agent to deal with pruritus.  There is some evidence for remeron and gabapentin. Unfortunately, both are associated with drowsiness  ?? Switch to thalidomide/prednisone: This would require a diagnosis of myelofibrosis, which we do not have.  I would insist on another bone marrow biopsy. I should add that an increasing LDH makes this a bit more possible  ?? Switch to momelitinib: This is not yet available.   ?? Address other causes of fatigue.     Given these choices, I will go with the first and last option.  In particular, she needs to work on her interrupted sleep pattern.  This means addressing her nocturia.       PLAN:  1) Decrease to 10 mg of Jakafi twice a day.  This will continue to take care of itching   2) Add Hydrea (500 mg)   3) Get labs thru [facility] in 4 to 6 weeks  4) Sleep hygiene, avoiding liquids in the evening.  She was encouraged to keep a sleep log.     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 CBC: 7.3/12.0/312  ??  CD34: 0.02  ?? VWF: Normal   ?? MPN score: 48 to 49  ?? Began ruxolitinib 10 mg BID    04/05/19  ?? CBC: 7.3/12.0/312  ?? MPN score: 36 - 38  ?? Ruxolitinib 10 mg BID; Hydrea was stopped     05/17/19: Ruxolitinib 20 mg BID  05/31/19: Ruxolitinib 15 mg BID (decrease due to anemia of 10.6)  07/12/19: Ruxolitinib 10 mg BID (decrease due to anemia of 10.4, plts 477); added Hydrea 500 mg a day    INTERVAL HX:  Christy Blakney comes for FU of her MPN.  She notes continued problems with sluggishness.  She is ok in the morning, but becomes fatigued in the afternoon.  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  She feels this may help with her energy. She does note feeling less anxious.  She denies depression (0 on PHQ2).  She has nocturia about 2 to 3 x a night. She denies incontinence.  She is having sx of hypersomnolence.     She does not some problems problems with DOE.  She denies wheezing or cough.  She notes rapid heart rate when this happens. She denies CP and other sx of CHF.      MPN 10 Score  Symptom (Absent 0 to 10 worst imaginable) 2/03 2/24 4/21 6/02       Fatigue in the past 24 hours 6 9 7 9        Filling up quickly when you eat (Early satiety)  7 7 5 5        Abdominal discomfort  3 4 4 4        Inactivity 5 5 - 6 5 7        Problems with concentration - 6 7-8 6 7        Numbness/ Tingling (in my hands and feet) 5 to 6 0 0 0       Night sweats 0 0 1 0       Itching (pruritus) 10 7 0 0       Bone pain (diffuse not joint pain or arthritis) 6 6 3  0       Fever (>100 F) 0 0 0 0       Unintentional weight loss last 6 months 0 0 0 0       TOTAL 48 - 49 36 - 38 27 32         PHYSICAL EXAM:  VS: As recorded above  GENERAL: She appears well; mobile  HEENT: NP is patent; C/S are clear; bridgework  LYMPH NODES: No significant cervical, SCL, or axillary LAN  LUNGS: CTA; nl mechanics  COR: RRR; no m/r/g  ABD: NTND; no HSM  EXT: No edema     PMHx  ??? 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 no malignancy; neg LN  o Txed with Surgery, radiation, auto BMT, raloxifene  o Negative WU  for 28 genes associated with hereditary cancers  o Raloxifene  ??? Sjogren's syndrome  ??? Autoimmune thyroiditis - Txed with methimazole   ??? GERD - Txed with PPI  ??? Mixed hyperlipidemia - Tx with Lovaza, pravastatin     FHx:  ?? Daughter w/ spina bifida, BRCA 1081G>A mutation (w/o cancer)      2016 WHO Diagnostic Criteria for MPNs  ET  Major Criteria  ??? Plts > 450  ??? BM biopsy showing proliferation mainly of the megakaryocyte  lineage with increased numbers of enlarged, mature megakaryocytes with hyperlobulated nuclei. No significant left-shift of neutrophil granulopoiesis or erythropoiesis and very rarely minor (grade 1) increase in reticulin fibers  ??? Not meeting WHO criteria for BCR-ABL1?+?CML, PV, PMF, MDS, or other myeloid neoplasms)  ??? Presence of JAK2, CALR or MPL mutation    Minor Criteria  ??? Presence of a clonal marker (e.g., abnormal karyotype)  ??? Absence of evidence for reactive thrombocytosis    Diagnosis: All major criteria or first three major and one minor     PMF  Major criteria  ??? Proliferation and atypia of megakaryocytes accompanied by either reticulin and/or collagen fibrosis  grades 2 or 3 on a scale of 0 to 3  ??? Not meeting WHO criteria for ET, PV, BCR-ABL1+ CML, myelodysplastic syndromes, or other myeloid neoplasm  ??? Presence of JAK2, CALR or MPL mutation or in the absence of these mutations, presence of another clonal marker or absence of reactive myelofibrosis    Minor Criteria  ??? Anemia not attributed to another comorbid condition  ??? WBC > 11.0  ??? Palpable Splenomegaly   ??? LDH > ULN  ??? Leukoerythroblastosis     Diagnosis: 3 major and at least 1 minor confirmed in two consecutive determinations    IPSET Model for Prediction of Thrombosis    Age:    2   < 60: 0   > 60: 2  WBC count (x10e9)  0   < 11: 0   > 11: 1  Hx of Thrombosis  0   No: 0   Yes: 1  --------------------------------------------  Total:    2     10 yr thrombosis risk, median survival (yrs)  Lo Risk: 0 pts    --> 89%, NR  Int Risk: 1-2 pts --> 86%, 24.5  Hi Risk: 3-4 pts  --> 69%, 14.7    IPSET II  Age:    1   < 60: 0   > 60: 1  CVS Risk Factor*  0   No: 0   Yes: 1   Hx of Thrombosis  0   No: 0   Yes: 2  JAK2     2   No: 0   Yes: 2  --------------------------------------------  Total:    3    *Gave up smoking 10 years ago, near optimal non-LDL on treatment, non HTN    Annual Thrombosis Risk  Lo Risk: 0-1 pts  --> 1.03%  Int Risk: 2 pts     --> 2.35%  Hi Risk: 3-6 pts  --> 3.56%     NOTE  ?? JAK2 conveys a higher risk of thrombosis  ?? CALR conveys a higher risk of bleeding    Newer Model:  [email or web address]    Primary prophylaxis of thrombosis  All patients: Aspirin unless   ?????? VWF activity <30%,  ?????? Platelet >?1 million,  ?????? CALR-mutated low-risk ET    PV with Hct >?45%: Add phlebotomy/cytoreduction to target Hct <?45%  Age >?60 years and/or prior history of thrombosis: Add cytoreduction  Secondary prophylaxis after thrombotic event  ?All patients: Cytoreduction  ?Typical VTE: Consider indefinite VKA for most patients; aspirin if not on VKA  ?Atypical VTE: Indefinite VKA  ?Arterial thrombosis: Aspirin

## 2019-07-13 DIAGNOSIS — Z853 Personal history of malignant neoplasm of breast: Principal | ICD-10-CM

## 2019-07-13 DIAGNOSIS — D473 Essential (hemorrhagic) thrombocythemia: Principal | ICD-10-CM

## 2019-07-13 MED ORDER — RALOXIFENE 60 MG TABLET
ORAL_TABLET | 3 refills | 0 days
Start: 2019-07-13 — End: ?

## 2019-07-13 MED ORDER — HYDROXYUREA 500 MG CAPSULE
ORAL_CAPSULE | Freq: Every day | ORAL | 0 refills | 90 days | Status: CP
Start: 2019-07-13 — End: 2020-07-12

## 2019-07-13 NOTE — Unmapped (Signed)
[facility] Specialty Medication Onboarding    Specialty Medication: Hydroxyurea capsules  Prior Authorization: Not Required   Financial Assistance: No - copay  <$25  Final Copay/Day Supply: $0 / 30    Insurance Restrictions: None     Notes to Pharmacist:     The triage team has completed the benefits investigation and has determined that the patient is able to fill this medication at [facility]. Please contact the patient to complete the onboarding or follow up with the prescribing physician as needed.

## 2019-07-13 NOTE — Unmapped (Signed)
Prescription was filled on 05/29/19 for a year supply

## 2019-07-13 NOTE — Unmapped (Signed)
Jakafi dose decrease  Last filled 15mg  tablet on 06/27/19  New script for 10mg  tablets = $0 copay

## 2019-07-13 NOTE — Unmapped (Signed)
[facility]   Patient Onboarding/Medication Counseling    The patient declined counseling on missed dose instructions, goals of therapy, side effects and monitoring parameters, warnings and precautions, drug/food interactions and storage, handling precautions, and disposal because they have taken the medication previously. The information in the declined sections below are for informational purposes only and was not discussed with patient.     Christy Buchanan is going out of town ([place]) x 6-8 weeks starting 07/22/19.  HU 90 day supply approved via insurance.  [facility] will call ins for vacation override for Jakafi 10mg .    Christy Buchanan is a 64 y.o. 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  I am speaking to the patient.    Was a Nurse, learning disability used for this call? No    Verified patient's date of birth / HIPAA.    Specialty medication(s) to be sent: Hematology/Oncology: Hydroxyurea and Jakafi    Non-specialty medications/supplies to be sent: none    Medications not needed at this time: none     Hydrea (hydroxyurea)     Medication & Administration     Dosage: Take 1 capsules (500mg ) by mouth once daily    Administration: Take by mouth once daily at about the same time each day. Take without regard to meals with a full glass of water. Swallow the capsule whole and do not chew, open, break, or crush the capsules.    Adherence/Missed dose instructions: Take a missed dose as soon as you remember it. If it is close to the time of your next dose, skip the missed dose and resume your normal schedule.    Goals of Therapy     Prevent disease progression    Side Effects & Monitoring Parameters   ??? Upset stomach (nausea, vomiting)  ??? Diarrhea/constipation  ??? Fatigue   ??? Loss of appetite  ??? Headache  ??? Mouth sores  ??? Weight gain  ??? Hair loss (not usually seen in HU doses)  ??? Increased risk of infections  ??? Increased risk of bleeding  ??? Photosensitivity     The following side effects should be reported to the provider:  ??? Signs of an allergic reaction  ??? Skin changes  ??? Shortness of breath, difficulty breathing, new or worsening cough  ??? Heartbeat that doesn't feel normal (heart feels like it is racing, skipping a beat or fluttering)  ??? Signs of infection  ??? Excessive bruising, gums bleeding or nose bleeds  ??? Skin changes  ??? Yellowing of skin or eyes  ??? Decrease in urination, change in color of urine, blood in urine or swelling in ankles  ??? Tumor Lysis Syndrome symptoms (excessive nausea, vomiting, diarrhea or lethargic)      Contraindications, Warnings, & Precautions     ??? Bone marrow suppression (CBC should be monitored at baseline and periodically throughout treatment)   ??? Mouth sores-discussed use of baking soda/salt rinses  ??? Exposure of an unborn child to this medication could cause birth defects so you should not become pregnant or father a [place] while on this medication.  Effective birth control is necessary during treatment and for 6 months after treatment for women and 1 year for men.  ??? Radiation recall- when there is a skin reaction (looks like a severe sunburn) in the areas where radiation was previously given    Drug/Food Interactions     ??? Medication list reviewed in Epic. The patient was instructed to inform the care  team before taking any new medications or supplements. No drug interactions identified.   ??? Avoid use of live vaccines during therapy.    Storage, Handling Precautions, & Disposal   ??? Hydroxyurea should be stored at room temperature  ??? Compound hydroxyurea is stable for 60 days at room temperature  ??? Hydroxyurea is considered a hazardous agent. Anyone other than the patient should wear gloves if handling.  o Wash hands thoroughly before and after handling    Current Medications (including OTC/herbals), Comorbidities and Allergies     Current Outpatient Medications   Medication Sig Dispense Refill   ??? aspirin (ECOTRIN) 81 MG tablet Take 81 mg by mouth.     ??? atorvastatin (LIPITOR) 40 MG tablet Take 1 tablet (40 mg total) by mouth daily. 30 tablet 11   ??? cetirizine (ZYRTEC) 10 MG tablet Take 10 mg by mouth every other day.      ??? cyanocobalamin 1000 MCG tablet Take 1,000 mcg by mouth.     ??? docusate sodium (COLACE) 100 MG capsule Take 100 mg by mouth daily as needed.  (Patient not taking: Reported on 07/12/2019)     ??? hydroxyurea (HYDREA) 500 mg capsule Take 1 capsule (500 mg total) by mouth daily. 30 capsule 2   ??? meTHIMazole (TAPAZOLE) 5 MG tablet TAKE 1/2 TABLET BY MOUTH EVERY DAY 30 tablet 0   ??? multivitamin (MULTIVITAMIN) per tablet Take 1 tablet by mouth daily.      ??? omega-3 acid ethyl esters (LOVAZA) 1 gram capsule Take 1 g by mouth.     ??? omeprazole (PRILOSEC) 20 MG capsule TAKE 1 CAPSULE(20 MG) BY MOUTH DAILY 90 capsule 0   ??? ondansetron (ZOFRAN) 4 MG tablet Take 1 tablet (4 mg total) by mouth daily as needed for nausea. 30 tablet 2   ??? PARoxetine (PAXIL) 20 MG tablet TAKE 1 TABLET BY MOUTH EVERY DAY 30 tablet 0   ??? polyethylene glycol (MIRALAX) 17 gram packet Take 17 g by mouth daily. (Patient not taking: Reported on 07/12/2019)     ??? raloxifene (EVISTA) 60 mg tablet Take 1 tablet (60 mg total) by mouth daily. 90 tablet 3   ??? ruxolitinib (JAKAFI) 10 mg tablet Take 1 tablet (10 mg total) by mouth Two (2) times a day. 60 tablet 2   ??? simethicone (MYLICON) 80 MG chewable tablet Chew 80 mg every six (6) hours as needed. (Patient not taking: Reported on 07/12/2019)       No current facility-administered medications for this visit.       No Known Allergies    Patient Active Problem List   Diagnosis   ??? Autoimmune thyroiditis   ??? Abnormal Pap smear of cervix   ??? Family history of colon cancer   ??? Family history of thyroid cancer   ??? Gastroesophageal reflux disease   ??? Intermittent palpitations   ??? History of breast cancer   ??? Mixed hyperlipidemia   ??? Postmenopausal osteoporosis   ??? Pure hypercholesterolemia   ??? Sjogrens syndrome (CMS-HCC)   ??? Vitamin D deficiency   ??? Depression, major, single episode, complete remission (CMS-HCC)   ??? Essential thrombocythemia (CMS-HCC)   ??? Anxiety and depression       Reviewed and up to date in Epic.    Appropriateness of Therapy     Is medication and dose appropriate based on diagnosis? Yes    Prescription has been clinically reviewed: Yes    Baseline Quality of Life Assessment  How many days over the past month did your essential thrombocythemia  keep you from your normal activities? For example, brushing your teeth or getting up in the morning. 0    Financial Information     Medication Assistance provided: None Required    Anticipated copay of $3.70/90 days or $0 /30 days reviewed with patient. Verified delivery address.    Delivery Information     Scheduled delivery date: 07/18/19    Expected start date: pt has taken before not new start    Medication will be delivered via Next Day Courier to the prescription address in [facility].  This shipment will not require a signature.      Explained the services we provide at [facility] and that each month we would call to set up refills.  Stressed importance of returning phone calls so that we could ensure they receive their medications in time each month.  Informed patient that we should be setting up refills 7-10 days prior to when they will run out of medication.  A pharmacist will reach out to perform a clinical assessment periodically.  Informed patient that a welcome packet and a drug information handout will be sent.      Patient verbalized understanding of the above information as well as how to contact the pharmacy at [phone] option 4 with any questions/concerns.  The pharmacy is open Monday through Friday 8:30am-4:30pm.  A pharmacist is available 24/7 via pager to answer any clinical questions they may have.    Patient Specific Needs     - Does the patient have any physical, cognitive, or cultural barriers? No    - Patient prefers to have medications discussed with  Patient     - Is the patient or caregiver able to read and understand education materials at a high school level or above? Yes    - Patient's primary language is  English     - Is the patient high risk? No     - Does the patient require a Care Management Plan? No     - Does the patient require physician intervention or other additional services (i.e. nutrition, smoking cessation, social work)? No      Ailen Strauch [facility] Specialty Pharmacist

## 2019-07-17 DIAGNOSIS — K219 Gastro-esophageal reflux disease without esophagitis: Principal | ICD-10-CM

## 2019-07-17 DIAGNOSIS — D473 Essential (hemorrhagic) thrombocythemia: Principal | ICD-10-CM

## 2019-07-17 MED ORDER — HYDROXYUREA 500 MG CAPSULE
ORAL_CAPSULE | Freq: Every day | ORAL | 0 refills | 90 days
Start: 2019-07-17 — End: 2020-07-16

## 2019-07-17 MED ORDER — ATORVASTATIN 40 MG TABLET
ORAL_TABLET | Freq: Every day | ORAL | 11 refills | 30 days
Start: 2019-07-17 — End: 2020-07-16

## 2019-07-17 MED FILL — HYDROXYUREA 500 MG CAPSULE: 90 days supply | Qty: 90 | Fill #0 | Status: AC

## 2019-07-17 MED FILL — JAKAFI 10 MG TABLET: 30 days supply | Qty: 60 | Fill #0 | Status: AC

## 2019-07-18 MED ORDER — HYDROXYUREA 500 MG CAPSULE
ORAL_CAPSULE | Freq: Every day | ORAL | 0 refills | 90 days
Start: 2019-07-18 — End: 2020-07-17

## 2019-07-18 MED ORDER — OMEPRAZOLE 20 MG CAPSULE,DELAYED RELEASE
ORAL_CAPSULE | Freq: Every day | ORAL | 0 refills | 30.00000 days | Status: CP
Start: 2019-07-18 — End: ?

## 2019-07-19 NOTE — Unmapped (Addendum)
Ms. Christy Buchanan's vacation supply of Jakafi 10 mg tablets has been approved through [employer] with a vacation override.  She is allowed 2 vacation fills per year.  [facility] has schedule the delivery as a same day courier to the prescription address on the agreed upon date of 07/21/19-SA.      Horace Porteous, PharmD  [facility]

## 2019-07-21 MED FILL — JAKAFI 10 MG TABLET: 30 days supply | Qty: 60 | Fill #1 | Status: AC

## 2019-07-21 MED FILL — JAKAFI 10 MG TABLET: ORAL | 30 days supply | Qty: 60 | Fill #1

## 2019-08-17 LAB — CBC W/ DIFFERENTIAL
BASOPHILS ABSOLUTE COUNT: 0.1 10*3/uL (ref 0.0–0.2)
EOSINOPHILS ABSOLUTE COUNT: 0.1 10*3/uL (ref 0.0–0.4)
EOSINOPHILS RELATIVE PERCENT: 2 %
HEMATOCRIT: 29.8 % — ABNORMAL LOW (ref 34.0–46.6)
HEMOGLOBIN: 10 g/dL — ABNORMAL LOW (ref 11.1–15.9)
IMMATURE GRANULOCYTES: 0 %
LYMPHOCYTES ABSOLUTE COUNT: 1.4 10*3/uL (ref 0.7–3.1)
LYMPHOCYTES RELATIVE PERCENT: 30 %
MEAN CORPUSCULAR HEMOGLOBIN CONC: 33.6 g/dL (ref 31.5–35.7)
MEAN CORPUSCULAR HEMOGLOBIN: 33.9 pg — ABNORMAL HIGH (ref 26.6–33.0)
MEAN CORPUSCULAR VOLUME: 101 fL — ABNORMAL HIGH (ref 79–97)
MONOCYTES ABSOLUTE COUNT: 0.6 10*3/uL (ref 0.1–0.9)
MONOCYTES RELATIVE PERCENT: 14 %
NEUTROPHILS ABSOLUTE COUNT: 2.4 10*3/uL (ref 1.4–7.0)
NEUTROPHILS RELATIVE PERCENT: 53 %
PLATELET COUNT: 351 10*3/uL (ref 150–450)
RED CELL DISTRIBUTION WIDTH: 16.7 % — ABNORMAL HIGH (ref 11.7–15.4)
WHITE BLOOD CELL COUNT: 4.6 10*3/uL (ref 3.4–10.8)

## 2019-08-17 LAB — MONOCYTES RELATIVE PERCENT: Monocytes/100 leukocytes:NFr:Pt:Bld:Qn:Automated count: 14

## 2019-08-23 DIAGNOSIS — K219 Gastro-esophageal reflux disease without esophagitis: Principal | ICD-10-CM

## 2019-08-23 MED ORDER — OMEPRAZOLE 20 MG CAPSULE,DELAYED RELEASE
ORAL_CAPSULE | 0 refills | 0.00000 days
Start: 2019-08-23 — End: ?

## 2019-08-23 NOTE — Unmapped (Signed)
Unable to reach patient.

## 2019-09-12 NOTE — Unmapped (Signed)
[facility] Clinical Assessment & Refill Coordination Note    Christy Buchanan, DOB: 04-Jul-1955  Phone: [phone] (home)     All above HIPAA information was verified with patient.     Was a Nurse, learning disability used for this call? No    Specialty Medication(s):   Hematology/Oncology: Hydroxyurea and Jakafi     Current Outpatient Medications   Medication Sig Dispense Refill   ??? aspirin (ECOTRIN) 81 MG tablet Take 81 mg by mouth.     ??? atorvastatin (LIPITOR) 40 MG tablet Take 1 tablet (40 mg total) by mouth daily. 30 tablet 11   ??? cetirizine (ZYRTEC) 10 MG tablet Take 10 mg by mouth every other day.      ??? cyanocobalamin 1000 MCG tablet Take 1,000 mcg by mouth.     ??? docusate sodium (COLACE) 100 MG capsule Take 100 mg by mouth daily as needed.  (Patient not taking: Reported on 07/12/2019)     ??? hydroxyurea (HYDREA) 500 mg capsule Take 1 capsule (500 mg total) by mouth daily. 90 capsule 0   ??? meTHIMazole (TAPAZOLE) 5 MG tablet TAKE 1/2 TABLET BY MOUTH EVERY DAY 30 tablet 0   ??? multivitamin (MULTIVITAMIN) per tablet Take 1 tablet by mouth daily.      ??? omega-3 acid ethyl esters (LOVAZA) 1 gram capsule Take 1 g by mouth.     ??? omeprazole (PRILOSEC) 20 MG capsule Take 1 capsule (20 mg total) by mouth daily. TAKE 1 CAPSULE(20 MG) BY MOUTH DAILY 30 capsule 0   ??? ondansetron (ZOFRAN) 4 MG tablet Take 1 tablet (4 mg total) by mouth daily as needed for nausea. 30 tablet 2   ??? PARoxetine (PAXIL) 20 MG tablet TAKE 1 TABLET BY MOUTH EVERY DAY 30 tablet 0   ??? polyethylene glycol (MIRALAX) 17 gram packet Take 17 g by mouth daily. (Patient not taking: Reported on 07/12/2019)     ??? raloxifene (EVISTA) 60 mg tablet Take 1 tablet (60 mg total) by mouth daily. 90 tablet 3   ??? ruxolitinib (JAKAFI) 10 mg tablet Take 1 tablet (10 mg total) by mouth Two (2) times a day. 60 tablet 2   ??? simethicone (MYLICON) 80 MG chewable tablet Chew 80 mg every six (6) hours as needed. (Patient not taking: Reported on 07/12/2019) No current facility-administered medications for this visit.        Changes to medications: Xylah reports no changes at this time.    No Known Allergies    Changes to allergies: No    SPECIALTY MEDICATION ADHERENCE     Hydroxyurea 500 mg: 30 days of medicine on hand   Jakafi 10 mg: 20 days of medicine on hand     Medication Adherence    Patient reported X missed doses in the last month: 0  Specialty Medication: Jakafi 10 mg BID  Patient is on additional specialty medications: Yes  Additional Specialty Medications: Hydroxyurea 500 mg daily  Patient Reported Additional Medication X Missed Doses in the Last Month: 0  Patient is on more than two specialty medications: No  Informant: patient  Confirmed plan for next specialty medication refill: delivery by pharmacy          Specialty medication(s) dose(s) confirmed: Regimen is correct and unchanged.     Are there any concerns with adherence? No    Adherence counseling provided? Not needed    CLINICAL MANAGEMENT AND INTERVENTION      Clinical Benefit Assessment:    Do  you feel the medicine is effective or helping your condition? Yes    Clinical Benefit counseling provided? Not needed    Adverse Effects Assessment:    Are you experiencing any side effects? No    Are you experiencing difficulty administering your medicine? No    Quality of Life Assessment:    How many days over the past month did your Essential thrombocythemia  keep you from your normal activities? For example, brushing your teeth or getting up in the morning. 0    Have you discussed this with your provider? Not needed    Therapy Appropriateness:    Is therapy appropriate? Yes, therapy is appropriate and should be continued    DISEASE/MEDICATION-SPECIFIC INFORMATION      N/A    PATIENT SPECIFIC NEEDS     - Does the patient have any physical, cognitive, or cultural barriers? No    - Is the patient high risk? Yes, patient is taking oral chemotherapy. Appropriateness of therapy as been assessed    - Does the patient require a Care Management Plan? No     - Does the patient require physician intervention or other additional services (i.e. nutrition, smoking cessation, social work)? No      SHIPPING     Specialty Medication(s) to be Shipped:   Hematology/Oncology: Hydroxyurea and Jakafi    Other medication(s) to be shipped: No additional medications requested for fill at this time     Changes to insurance: No    Delivery Scheduled: Yes, Expected medication delivery date: 09/18/19.     Medication will be delivered via Same Day Courier to the confirmed prescription address in [facility].    The patient will receive a drug information handout for each medication shipped and additional FDA Medication Guides as required.  Verified that patient has previously received a Conservation officer, historic buildings.    All of the patient's questions and concerns have been addressed.    Breck Coons [facility] Specialty Pharmacist

## 2019-09-18 MED FILL — JAKAFI 10 MG TABLET: ORAL | 30 days supply | Qty: 60 | Fill #2

## 2019-09-18 MED FILL — JAKAFI 10 MG TABLET: 30 days supply | Qty: 60 | Fill #2 | Status: AC

## 2019-10-02 ENCOUNTER — Ambulatory Visit: Admit: 2019-10-02 | Discharge: 2019-10-03 | Payer: MEDICARE

## 2019-10-02 DIAGNOSIS — D473 Essential (hemorrhagic) thrombocythemia: Principal | ICD-10-CM

## 2019-10-02 MED ORDER — RUXOLITINIB 10 MG TABLET
ORAL_TABLET | Freq: Two times a day (BID) | ORAL | 2 refills | 30.00000 days | Status: CP
Start: 2019-10-02 — End: ?

## 2019-10-02 MED ORDER — HYDROXYUREA 500 MG CAPSULE
ORAL_CAPSULE | Freq: Every day | ORAL | 0 refills | 90.00000 days | Status: CP
Start: 2019-10-02 — End: 2020-10-01
  Filled 2019-10-11: qty 90, 90d supply, fill #0

## 2019-10-02 MED ORDER — OMEPRAZOLE 20 MG CAPSULE,DELAYED RELEASE
ORAL_CAPSULE | Freq: Every day | ORAL | 2 refills | 90 days | Status: CP
Start: 2019-10-02 — End: ?

## 2019-10-02 NOTE — Unmapped (Signed)
[facility] Refill Coordination Note    Specialty Medication(s) to be Shipped:   Hematology/Oncology: Hydroxyurea    Other medication(s) to be shipped: No additional medications requested for fill at this time     Christy Buchanan, DOB: Mar 07, 1955  Phone: [phone] (home)       All above HIPAA information was verified with patient.     Was a Nurse, learning disability used for this call? No    Completed refill call assessment today to schedule patient's medication shipment from the [facility] [phone]).       Specialty medication(s) and dose(s) confirmed: Regimen is correct and unchanged.   Changes to medications: Jennavieve reports no changes at this time.  Changes to insurance: No  Questions for the pharmacist: No    Confirmed patient received Welcome Packet with first shipment. The patient will receive a drug information handout for each medication shipped and additional FDA Medication Guides as required.       DISEASE/MEDICATION-SPECIFIC INFORMATION        N/A    SPECIALTY MEDICATION ADHERENCE     Medication Adherence    Patient reported X missed doses in the last month: 0  Specialty Medication: Jakafi 10mg   Patient is on additional specialty medications: Yes  Additional Specialty Medications: Hydroxyurea 500mg   Patient Reported Additional Medication X Missed Doses in the Last Month: 0  Patient is on more than two specialty medications: No  Informant: patient                Hydroxurea 500 mg: 17 days of medicine on hand   Jakafi 10 mg: 37 days of medicine on hand          SHIPPING     Shipping address confirmed in Epic.     Delivery Scheduled: Yes, Expected medication delivery date: 10/12/19.  However, Rx request for refills was sent to the provider as there are none remaining.     Medication will be delivered via Next Day Courier to the prescription address in Epic [place].    [name]   [facility] Specialty Technician

## 2019-10-02 NOTE — Unmapped (Signed)
Condition is stable.  Continue with care per specialist

## 2019-10-02 NOTE — Unmapped (Signed)
Continue with care per Dr. Claria Dice

## 2019-10-02 NOTE — Unmapped (Signed)
Condition is stable  Medication and treatment plan as described in orders or med list.  Counseled on etiology, treatment, warning signs.  Patient education handout provided.

## 2019-10-02 NOTE — Unmapped (Signed)
Patient Education        Gastroesophageal Reflux Disease (GERD): Care Instructions  Overview     Gastroesophageal reflux disease (GERD) is the backward flow of stomach acid into the esophagus. The esophagus is the tube that leads from your throat to your stomach. A one-way valve prevents the stomach acid from backing up into this tube. But when you have GERD, this valve does not close tightly enough. This can also cause pain and swelling in your esophagus. (This is called esophagitis.)  If you have mild GERD symptoms including heartburn, you may be able to control the problem with antacids or over-the-counter medicine. You can also make lifestyle changes to help reduce your symptoms. These include changing your diet and eating habits, such as not eating late at night and losing weight.  Follow-up care is a key part of your treatment and safety. Be sure to make and go to all appointments, and call your doctor if you are having problems. It's also a good idea to know your test results and keep a list of the medicines you take.  How can you care for yourself at home?  ?? Take your medicines exactly as prescribed. Call your doctor if you think you are having a problem with your medicine.  ?? Your doctor may recommend over-the-counter medicine. For mild or occasional indigestion, antacids, such as Tums, Gaviscon, Mylanta, or Maalox, may help. Your doctor also may recommend over-the-counter acid reducers, such as famotidine (Pepcid AC), cimetidine (Tagamet HB), or omeprazole (Prilosec). Read and follow all instructions on the label. If you use these medicines often, talk with your doctor.  ?? Change your eating habits.  ? It's best to eat several small meals instead of two or three large meals.  ? After you eat, wait 2 to 3 hours before you lie down.  ? Chocolate, mint, and alcohol can make GERD worse.  ? Spicy foods, foods that have a lot of acid (like tomatoes and oranges), and coffee can make GERD symptoms worse in some people. If your symptoms are worse after you eat a certain food, you may want to stop eating that food to see if your symptoms get better.  ?? Do not smoke or chew tobacco. Smoking can make GERD worse. If you need help quitting, talk to your doctor about stop-smoking programs and medicines. These can increase your chances of quitting for good.  ?? If you have GERD symptoms at night, raise the head of your bed 6 to 8 inches by putting the frame on blocks or placing a foam wedge under the head of your mattress. (Adding extra pillows does not work.)  ?? Do not wear tight clothing around your middle.  ?? Lose weight if you need to. Losing just 5 to 10 pounds can help.  When should you call for help?   Call your doctor now or seek immediate medical care if:  ?? ?? You have new or different belly pain.   ?? ?? Your stools are black and tarlike or have streaks of blood.   Watch closely for changes in your health, and be sure to contact your doctor if:  ?? ?? Your symptoms have not improved after 2 days.   ?? ?? Food seems to catch in your throat or chest.   Where can you learn more?  Go to [facility] at [email or web address]  Select Patient Education under [employer]. Enter [phone] in the search box to learn more about Gastroesophageal Reflux Disease (GERD): Care  Instructions.  Current as of: March 22, 2019??????????????????????????????Content Version: 12.9  ?? 2006-2021 Healthwise, Incorporated.   Care instructions adapted under license by [facility]. If you have questions about a medical condition or this instruction, always ask your healthcare professional. Healthwise, Incorporated disclaims any warranty or liability for your use of this information.         Patient Education        Recovering From Depression: Care Instructions  Your Care Instructions     Taking good care of yourself is important as you recover from depression. In time, your symptoms will fade as your treatment takes hold. Do not give up. Instead, focus your energy on getting better.  Your mood will improve. It just takes some time. Focus on things that can help you feel better, such as being with friends and family, eating well, and getting enough rest. But take things slowly. Do not do too much too soon. You will begin to feel better gradually.  Follow-up care is a key part of your treatment and safety. Be sure to make and go to all appointments, and call your doctor if you are having problems. It's also a good idea to know your test results and keep a list of the medicines you take.  How can you care for yourself at home?  Be realistic  ?? If you have a large task to do, break it up into smaller steps you can handle, and just do what you can.  ?? You may want to put off important decisions until your depression has lifted. If you have plans that will have a major impact on your life, such as marriage, divorce, or a job change, try to wait a bit. Talk it over with friends and loved ones who can help you look at the overall picture first.  ?? Reaching out to people for help is important. Do not isolate yourself. Let your family and friends help you. Find someone you can trust and confide in, and talk to that person.  ?? Be patient, and be kind to yourself. Remember that depression is not your fault and is not something you can overcome with willpower alone. Treatment is important for depression, just like for any other illness. Feeling better takes time, and your mood will improve little by little.  Stay active  ?? Stay busy and get outside. Take a walk, or try some other light exercise.  ?? Talk with your doctor about an exercise program. Exercise can help with mild depression.  ?? Go to a movie or concert. Take part in a church activity or other social gathering. Go to a ball game.  ?? Ask a friend to have dinner with you.  Take care of yourself  ?? Eat a balanced diet with plenty of fresh fruits and vegetables, whole grains, and lean protein. If you have lost your appetite, eat small snacks rather than large meals.  ?? Avoid using illegal drugs or marijuana and drinking alcohol. Do not take medicines that have not been prescribed for you. They may interfere with medicines you may be taking for depression, or they may make your depression worse.  ?? Take your medicines exactly as they are prescribed. You may start to feel better within 1 to 3 weeks of taking antidepressant medicine. But it can take as many as 6 to 8 weeks to see more improvement. If you have questions or concerns about your medicines, or if you do not notice any improvement by 3 weeks,  talk to your doctor.  ?? Continue to take your medicine after your symptoms improve. Taking your medicine for at least 6 months after you feel better can help keep you from getting depressed again. If this isn't the first time you have been depressed, your doctor may recommend you to take medicine even longer.  ?? If you have any side effects from your medicine, tell your doctor. Many side effects are mild and will go away on their own after you have been taking the medicine for a few weeks. Some may last longer. Talk to your doctor if side effects are bothering you too much. You might be able to try a different medicine.  ?? Continue counseling. It may help prevent depression from returning, especially if you've had multiple episodes of depression. Talk with your counselor if you are having a hard time attending your sessions or you think the sessions aren't working. Don't just stop going.  ?? Get enough sleep. Talk to your doctor if you are having problems sleeping.  ?? Avoid sleeping pills unless they are prescribed by the doctor treating your depression. Sleeping pills may make you groggy during the day, and they may interact with other medicine you are taking.  ?? If you have any other illnesses, such as diabetes, heart disease, or high blood pressure, make sure to continue with your treatment. Tell your doctor about all of the medicines you take, including those with or without a prescription.  ?? If you or someone you know talks about suicide, self-harm, or feeling hopeless, get help right away. Call the National Suicide Prevention Lifeline at 1-800-273-TALK (864)121-2542) or text HOME to 737-587-2480 to access the Crisis Text Line. Consider saving these numbers in your phone.  When should you call for help?   Call 911 anytime you think you may need emergency care. For example, call if:  ?? ?? You feel like hurting yourself or someone else.   ?? ?? Someone you know has depression and is about to attempt or is attempting suicide.   Call your doctor now or seek immediate medical care if:  ?? ?? You hear voices.   ?? ?? Someone you know has depression and:  ? Starts to give away his or her possessions.  ? Uses illegal drugs or drinks alcohol heavily.  ? Talks or writes about death, including writing suicide notes or talking about guns, knives, or pills.  ? Starts to spend a lot of time alone.  ? Acts very aggressively or suddenly appears calm.   Watch closely for changes in your health, and be sure to contact your doctor if:  ?? ?? You do not get better as expected.   Where can you learn more?  Go to [facility] at [email or web address]  Select Patient Education under [employer]. Enter [phone] in the search box to learn more about Recovering From Depression: Care Instructions.  Current as of: November 02, 2018??????????????????????????????Content Version: 12.9  ?? 2006-2021 Healthwise, Incorporated.   Care instructions adapted under license by [facility]. If you have questions about a medical condition or this instruction, always ask your healthcare professional. Healthwise, Incorporated disclaims any warranty or liability for your use of this information.

## 2019-10-02 NOTE — Unmapped (Signed)
ASSESSMENT/PLAN    Problem List Items Addressed This Visit        Digestive    Gastroesophageal reflux disease (Chronic)     Condition is stable  Medication and treatment plan as described in orders or med list.               Relevant Medications    omeprazole (PRILOSEC) 20 MG capsule       Hematopoietic and Hemostatic    Essential thrombocythemia (CMS-HCC) (Chronic)     Condition is stable.  Continue with care per specialist         Relevant Medications    ruxolitinib (JAKAFI) 10 mg tablet       Other    Depression, major, single episode, complete remission (CMS-HCC) (Chronic)     Condition is stable  Medication and treatment plan as described in orders or med list.  Counseled on etiology, treatment, warning signs.  Patient education handout provided.               JAK-2 gene mutation (Chronic)     Continue with care per Dr. Claria Dice               I personally spent 30 minutes face-to-face and non-face-to-face in the care of this patient, which includes all pre, intra, and post visit time on the date of service.    Patient education handout provided.      Follow-up visits include:   Future Appointments   Date Time Provider Department Center   10/25/2019  7:30 AM ADULT ONC LAB [facility]   10/25/2019  8:40 AM Halford Decamp, MD [id number]              SUBJECTIVE    Subjective   This is a 64 y.o. female who presents with had concerns including Annual Exam (Thyroid labs) and Medication Refill.     GERD  Paitent here for F/U with no new concerns  ??  Depression  Patient here for follow up of depression. Symptoms have been stable  since that time.     REVIEW OF SYSTEMS    Past medical history, medications, social history, and allergies reviewed and updated.  Remainder of  systems review negative        Objective     OBJECTIVE  BP 125/66  - Pulse 67  - Temp 36.7 ??C (98 ??F) (Oral)  - Ht 165.1 cm (5' 5)  - Wt 58.5 kg (129 lb)  - BMI 21.47 kg/m??    Physical Examination  Vital Signs- reviewed  General appearance - alert, well appearing, and in no distress  Mental status - alert, oriented to person, place, and time  Chest - clear to auscultation, no wheezes, rales or rhonchi, symmetric air entry  Heart - normal rate, regular rhythm without murmurs, rubs, clicks or gallops  Abdomen - soft, nontender, nondistended, no masses or organomegaly  Neurological - alert, oriented, normal speech, no focal findings or movement disorder noted  Extremities - no pedal edema, no clubbing or cyanosis    Labs, Imaging, and Other Clinical Data:  I have reviewed the labs, imaging studies, and other clinical data associated with this encounter.  See Epic Labs and Imaging section for details.

## 2019-10-02 NOTE — Unmapped (Signed)
Condition is stable  Medication and treatment plan as described in orders or med list.

## 2019-10-11 MED FILL — HYDROXYUREA 500 MG CAPSULE: 90 days supply | Qty: 90 | Fill #0 | Status: AC

## 2019-10-25 ENCOUNTER — Ambulatory Visit
Admit: 2019-10-25 | Discharge: 2019-10-26 | Payer: MEDICARE | Attending: Hematology & Oncology | Primary: Hematology & Oncology

## 2019-10-25 ENCOUNTER — Other Ambulatory Visit: Admit: 2019-10-25 | Discharge: 2019-10-26 | Payer: MEDICARE

## 2019-10-25 DIAGNOSIS — D473 Essential (hemorrhagic) thrombocythemia: Principal | ICD-10-CM

## 2019-10-25 LAB — CBC W/ AUTO DIFF
BASOPHILS ABSOLUTE COUNT: 0 10*9/L (ref 0.0–0.1)
BASOPHILS RELATIVE PERCENT: 0.3 %
EOSINOPHILS ABSOLUTE COUNT: 0.1 10*9/L (ref 0.0–0.4)
EOSINOPHILS RELATIVE PERCENT: 1.4 %
HEMATOCRIT: 30.7 % — ABNORMAL LOW (ref 36.0–46.0)
HEMOGLOBIN: 10.3 g/dL — ABNORMAL LOW (ref 12.0–16.0)
LARGE UNSTAINED CELLS: 3 % (ref 0–4)
LYMPHOCYTES ABSOLUTE COUNT: 2.1 10*9/L (ref 1.5–5.0)
LYMPHOCYTES RELATIVE PERCENT: 43.3 %
MEAN CORPUSCULAR HEMOGLOBIN: 38.2 pg — ABNORMAL HIGH (ref 26.0–34.0)
MEAN CORPUSCULAR VOLUME: 113.9 fL — ABNORMAL HIGH (ref 80.0–100.0)
MEAN PLATELET VOLUME: 8.5 fL (ref 7.0–10.0)
MONOCYTES ABSOLUTE COUNT: 0.4 10*9/L (ref 0.2–0.8)
MONOCYTES RELATIVE PERCENT: 7 %
NEUTROPHILS ABSOLUTE COUNT: 2.2 10*9/L (ref 2.0–7.5)
NEUTROPHILS RELATIVE PERCENT: 45.2 %
PLATELET COUNT: 383 10*9/L (ref 150–440)
RED BLOOD CELL COUNT: 2.69 10*12/L — ABNORMAL LOW (ref 4.00–5.20)
RED CELL DISTRIBUTION WIDTH: 17.9 % — ABNORMAL HIGH (ref 12.0–15.0)
WBC ADJUSTED: 4.9 10*9/L (ref 4.5–11.0)

## 2019-10-25 LAB — COMPREHENSIVE METABOLIC PANEL
ALBUMIN: 3.5 g/dL (ref 3.4–5.0)
ALKALINE PHOSPHATASE: 94 U/L (ref 46–116)
ALT (SGPT): 24 U/L (ref 10–49)
ANION GAP: 4 mmol/L — ABNORMAL LOW (ref 5–14)
AST (SGOT): 25 U/L (ref <=34)
BILIRUBIN TOTAL: 0.4 mg/dL (ref 0.3–1.2)
BLOOD UREA NITROGEN: 17 mg/dL (ref 9–23)
BUN / CREAT RATIO: 20
CALCIUM: 8.7 mg/dL (ref 8.7–10.4)
CHLORIDE: 109 mmol/L — ABNORMAL HIGH (ref 98–107)
CO2: 27 mmol/L (ref 20.0–31.0)
CREATININE: 0.84 mg/dL — ABNORMAL HIGH
EGFR CKD-EPI AA FEMALE: 84 mL/min/{1.73_m2} (ref >=60)
EGFR CKD-EPI NON-AA FEMALE: 73 mL/min/{1.73_m2} (ref >=60)
GLUCOSE RANDOM: 95 mg/dL (ref 70–179)
PROTEIN TOTAL: 6.5 g/dL (ref 5.7–8.2)
SODIUM: 140 mmol/L (ref 135–145)

## 2019-10-25 LAB — SMEAR REVIEW

## 2019-10-25 LAB — CREATININE: Creatinine:MCnc:Pt:Ser/Plas:Qn:: 0.84 — ABNORMAL HIGH

## 2019-10-25 LAB — SLIDE REVIEW

## 2019-10-25 LAB — LYMPHOCYTES RELATIVE PERCENT: Lymphocytes/100 leukocytes:NFr:Pt:Bld:Qn:Automated count: 43.3

## 2019-10-25 LAB — LACTATE DEHYDROGENASE: Lactate dehydrogenase:CCnc:Pt:Ser/Plas:Qn:Reaction: pyruvate to lactate: 318 — ABNORMAL HIGH

## 2019-10-25 MED ORDER — RUXOLITINIB 5 MG TABLET
ORAL_TABLET | Freq: Two times a day (BID) | ORAL | 5 refills | 30.00000 days | Status: CP
Start: 2019-10-25 — End: ?
  Filled 2019-10-27: qty 120, 30d supply, fill #0

## 2019-10-25 NOTE — Unmapped (Signed)
Clinical Assessment Needed For: Dose Change  Medication: JAKAFI  Last Fill Date: 09/18/19  Copay $8416.60  Was previous dose already scheduled to fill: No    Notes to Pharmacist: Submitted high copay referral

## 2019-10-25 NOTE — Unmapped (Addendum)
Where are you?   - From a blood clot prevention, you are fine.  The goal is a platelet < 450. You're there   - Symptom: How much benefit are you getting from ruxolitinib?     - Your symptoms are really centered on fatigue.     - Some benefit from itching and bone pain    - You are anemic - How much of a problem is this?     PLAN:  1) Jakofi:  You can adjust this med from 5 mg twice to 10 mg twice day. You need to on the lower dose for 6 weeks to see a change in hemoglobin.  Let us know if you are able to go 6 weeks, we will check your counts.   2) Exercise:  You need intensity.    3) Continue meditation   4) Return in 4 months -     Heart rate: Target > 100 and less the 120 (I may change those numbers).   You need 6 weeks worth of exercise.        Here is information from the [employer] for the construction of an air filter that delivers about 3 air-changes an hour [facility]).  If you have two of these boxes, you can attain 6 to 8 [facility].  (For reference, a hospital room has about 6 [facility] and the typical home is 0.3 [facility]).  These devices can be built from parts that cost less than $200 USD, without any special tools.           This DIY air purifier is designed by experts but can be built by an amateur in under an hour.   Courtesy of Tiffany Kocher     [employer]: Therapist, occupational Designs   https://www.wgbh.org/news/local-news/[email or web address]/09/26/diy-air-filters-for-classrooms-experts-are-enthusiastic-and-a-citizen-scientist-makes-it-easy]  [email or web address].     The '[employer] / Collier Bullock / [employer]' Chief of Staff, working together and in parallel, have come up with a Estate agent for an Air traffic controller which can remove a significant amount of COVID-19 virus from the air. The design involves making a 'box' out of four 20 MERV-13 filters (the 'sides' of the box), a 20 box fan (the 'top' of the box), and a cardboard (the 'bottom' of the box'). Air flows in through the filter sides, removing particulates of the sizes that can transport COVID-19 particles, and then flows out through the fan at the top.  These devices can be built from parts that cost less than $200 USD, without any special tools.    MERV-13 filters are capable of removing about 85% of viral-laden particulates from air; it's therefore a good assumption that about 85% of the air flow out of the fan at the top of the device is free of COVID-19 virus. Informal tests and analyses using air flow meters and particulate matter sensors indicate that these devices can provide about 600 cubic feet per minute of virus-filtered air to the room.    For medium-sized rooms, the performance of this air purifier design means that installing between one and three of these 'air boxes' can boost the local air changes per hour [facility]) to the 'minimum recommended' level of 6 [facility], suggested by the [employer]. (Note: this recommendation was made before the emergence of recent variants; yet greater ventilation rates may now be preferred).    Notes on type / number of filters:  ??? MERV-rated filters are a relatively well-understood, extensively-tested, and widely deployed technology; good  estimates exist for what percentage of particles of various sizes are typically removed by such filters. Once air flow through the filters is determined, determing an estimated 'clean air removal rate' is straightforward. This is a big advantage of the [employer] 'airbox' design, when compared with some of the proprietary, more obscure designs available on the commercial market.   ??? In addition to MERV-13 filters, MERV-12 and MERV-11 are also effective filters, capable of removing a smaller but still very significant percentage of virus-laden particles from the air. Use them if MERV-13 filter are unavailable, or too expensive.   ??? One-, two-, or three-filter box, as well as five-filter box designs are also possible and can be effective. The more filters in the box design, the greater the air flow rate through the box, and the lower the strain on the fan motor.    Caveats  ??? Be careful not to touch, shake or otherwise disturb the filters after the box has been used. The filters can effectively trap virus-laden particles, retaining them on their surface; by shaking them you can dislodge the particles, sending contaminated particles into the local air. Use best practices (e.g., N95 masks and great care) when moving or disposing of the filters -- place in large, sealed plastic bag before putting in dumpster;   ??? Replace filters approximately every 6 months or so;   ??? Positioning two 'airboxes' too close to one another in the room might result in the filtered-air output of one of them being taken in immediately into the other one -- a 'short circuit' -- minimizing the effectiveness of the additional box;   ??? Some locations in the room might be more effective at filtering air than others -- e.g., if you have an open window through which air is being ventilated outdoors, it's best not to position the box right in front of the open window -- because you're filtering air that won't be used inside the room;   ??? Best practice is to place e.g. two air boxes a few feet away from the walls, and avoiding corners -- so that they are filtering maximal amounts of air    Reference on ideal placements: Impact of placement of portable air cleaning devices in multizone residential environments -- for impact of various placements of air purifiers in room -- https://www.MediaExhibitions.com.au    Sourcing build materials  20 inch MERV-13 filters are commonly used in HVAC / ventilation systems, and are widely available in retail outlets, e.g.:  ??? [employer], for $20 each   ??? A pack of 6 for $130 at [employer]     20 inch box fans are also widely available, e.g.:  ??? [employer]: a Floydene Flock for $18     How much can they help?  Because COVID-19 is an 'airborne' pathogen,improving indoor ventilation is an important way to attempt to mitigate the risk of indoor transmission of the virus.  One way of assessing indoor ventilation for a space is in terms of 'air changes per hour' [facility]) -- the number of times that 'all the air in the room' has been replaced (ideally, by outdoor air).  For comparison:  ??? Hospital patient rooms are often kept at 6 [facility];   ??? Hospital airborne isolation rooms are kept at 12 [facility] or greater;   ??? Hospital operating rooms are kept at 20 or more [facility];   ??? ASHRAE recommendations for school classrooms are usually around 3 [facility] (actual classrooms are usually less);   ??? Ventilation  in a typical room in a home is less than 1 [facility].   ??? The [employer] of Public Health has recommended an [facility] of 6 or greater for COVID-19 risk mitigation (note: this recommendation was made before recent, more transmissable variants, like Delta).    References / Materials  SpotApps.nl.pdf  Illustrations of build process, with variations  http://cortez.biz/

## 2019-10-25 NOTE — Unmapped (Signed)
[facility] Clinical Assessment & Refill Coordination Note    Christy Buchanan, DOB: 1955/02/22  Phone: [phone] (home)     All above HIPAA information was verified with patient.     Was a Nurse, learning disability used for this call? No    Specialty Medication(s):   Hematology/Oncology: [id number]     Current Outpatient Medications   Medication Sig Dispense Refill   ??? aspirin (ECOTRIN) 81 MG tablet Take 81 mg by mouth.     ??? atorvastatin (LIPITOR) 40 MG tablet Take 1 tablet (40 mg total) by mouth daily. 30 tablet 11   ??? cetirizine (ZYRTEC) 10 MG tablet Take 10 mg by mouth every other day.      ??? hydroxyurea (HYDREA) 500 mg capsule Take 1 capsule (500 mg total) by mouth daily. 90 capsule 0   ??? meTHIMazole (TAPAZOLE) 5 MG tablet TAKE 1/2 TABLET BY MOUTH EVERY DAY 30 tablet 0   ??? multivitamin (MULTIVITAMIN) per tablet Take 1 tablet by mouth daily.      ??? omega-3 acid ethyl esters (LOVAZA) 1 gram capsule Take 1 g by mouth.     ??? omeprazole (PRILOSEC) 20 MG capsule Take 1 capsule (20 mg total) by mouth daily. TAKE 1 CAPSULE(20 MG) BY MOUTH DAILY 90 capsule 2   ??? ondansetron (ZOFRAN) 4 MG tablet Take 1 tablet (4 mg total) by mouth daily as needed for nausea. 30 tablet 2   ??? PARoxetine (PAXIL) 20 MG tablet TAKE 1 TABLET BY MOUTH EVERY DAY 30 tablet 0   ??? polyethylene glycol (MIRALAX) 17 gram packet Take 17 g by mouth daily.      ??? raloxifene (EVISTA) 60 mg tablet Take 1 tablet (60 mg total) by mouth daily. 90 tablet 3   ??? ruxolitinib (JAKAFI) 5 mg tablet Take 2 tablets (10 mg total) by mouth Two (2) times a day. 120 tablet 5     No current facility-administered medications for this visit.        Changes to medications: Shuronda reports no changes at this time.    No Known Allergies    Changes to allergies: No    SPECIALTY MEDICATION ADHERENCE     Jakafi 5 mg: 0 days of medicine on hand   Hydroxyurea 500  mg: over 60 days of medicine on hand     Medication Adherence    Patient reported X missed doses in the last month: 0  Specialty Medication: Jakafi 5 mg tabs - 2 tabs  bid  Patient is on additional specialty medications: Yes  Additional Specialty Medications: hydroxyurea  Patient Reported Additional Medication X Missed Doses in the Last Month: 0  Patient is on more than two specialty medications: No  Informant: patient  Confirmed plan for next specialty medication refill: delivery by pharmacy          Specialty medication(s) dose(s) confirmed: Jakafi dose changed to 5 mg tabs: take 1-2 tablets bid per provider instructions.  Label states 2 tabs bid.  Patient is aware to take 1 tab bid for a few weeks but may increase to 2 tabs if symptoms persist     Are there any concerns with adherence? No    Adherence counseling provided? Not needed    CLINICAL MANAGEMENT AND INTERVENTION      Clinical Benefit Assessment:    Do you feel the medicine is effective or helping your condition? Yes    Clinical Benefit counseling provided? Not needed    Adverse Effects Assessment:  Are you experiencing any side effects? No    Are you experiencing difficulty administering your medicine? No    Quality of Life Assessment:    How many days over the past month did your essential thrombocythemia  keep you from your normal activities? For example, brushing your teeth or getting up in the morning. 0    Have you discussed this with your provider? Not needed    Therapy Appropriateness:    Is therapy appropriate? Yes, therapy is appropriate and should be continued    DISEASE/MEDICATION-SPECIFIC INFORMATION      N/A    PATIENT SPECIFIC NEEDS     - Does the patient have any physical, cognitive, or cultural barriers? No    - Is the patient high risk? Yes, patient is taking oral chemotherapy. Appropriateness of therapy as been assessed    - Does the patient require a Care Management Plan? No     - Does the patient require physician intervention or other additional services (i.e. nutrition, smoking cessation, social work)? No      SHIPPING Specialty Medication(s) to be Shipped:   Hematology/Oncology: Hydroxyurea and Jakafi - HU not needed at this time    Other medication(s) to be shipped: No additional medications requested for fill at this time     Changes to insurance: No    Delivery Scheduled: Yes, Expected medication delivery date: 10/27/19.     Medication will be delivered via Same Day Courier to the confirmed prescription address in [facility].    The patient will receive a drug information handout for each medication shipped and additional FDA Medication Guides as required.  Verified that patient has previously received a Conservation officer, historic buildings.    All of the patient's questions and concerns have been addressed.    Breck Coons [facility] Specialty Pharmacist

## 2019-10-25 NOTE — Unmapped (Unsigned)
Patient stated that she is having a headache, no body pain. She didn't have any falls but she have been unsteady since she started the [place].

## 2019-10-25 NOTE — Unmapped (Unsigned)
PLAN:  1) Ruxolitinib: Decrease dose to 5 mg BID.    ?? If the bone pain and pruritus return, increase back to 10 mg BID.   ?? If she stays on 5 mg BID for 6 weeks, recheck a CBC locally.    2) Exercise:  You need intensity.    3) Continue meditation   4) Return in 4 months; I will recheck a CD34 at that time.     Heart rate: Target > 100 and less the 120 (I may change those numbers).   You need 6 weeks worth of exercise.      PLAN:  1) Decrease to 10 mg of Jakafi twice a day.  This will continue to take care of itching   2) Add Hydrea (500 mg)   3) Get labs thru [facility]   4) Sleep hygiene, avoiding liquids in the evening.  She was encouraged to keep a sleep log.     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  At the time of her diagnosis, she had no circulating immature or CD34+ cells, no increase in fibrosis, a normal LDH, and no palpable splenomegaly.  Since then, there has been a mild increase in her LDH and CD34 count.  Otherwise, she has been stable.      She has an intermediate risk for thrombosis by IPSET1 and high risk by IPSET2.  Dailey aspirin and cytoreduction are appropriate.  Of note, CV risk factors for this calculation include diabetes, hypertension, and active smoking.  She has not smoked in 10 years; her only other risk factor is hyperlipidemia, which is reasonably well controlled.  Even without CV risk factors, she is still considered high risk by the IPSET2.      She was started on ruxolitinib to deal with her elevated platelets and her MPN symptoms.  The drug has led to a decline in her overall symptom score (from high 40's to low 30's).  Her platelets normalized, but she developed anemia.  It is not clear if the anemia has added to her fatigue or erased any gains from her ruxolitinib.   Reducing her drug has not led to an increase in her hemoglobin.  I do think further reductions in the drug will lead to an increase in her other symptoms.  However, we can only determine this by lowering her dose once more though I anticipate returning to 10 mg BID.     I do think she can improve the way she feels by adding exercise.        PLAN:  1) Decrease to 10 mg of Jakafi twice a day.  This will continue to take care of itching   2) Add Hydrea (500 mg)   3) Get labs thru [facility] in 4 to 6 weeks  4) Sleep hygiene, avoiding liquids in the evening.  She was encouraged to keep a sleep log.     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  04/2016: Began Hydrea at 500 mg every day; increased to 1000 mg  05/2016: BM biopsy   o Increased atypical megakaryocytes with clustering  o No fibrosis, 2% blasts  o JAK2 mutation (VAF 3.5%)  o Nl karyotype   o Cellularity from 40 - 50%  o Patchy mild increase in reticulin; no increase  in collagen  o No monoclonal B or T cell populations    03/15/19: Seen at [facility]  ?? CBC: 7.3/12.0/312  ?? CD34: 0.02  ?? VWF: Normal   ?? MPN score: 48 to 49  ?? Began ruxolitinib 10 mg BID    04/05/19  ?? CBC: 7.3/12.0/312  ?? MPN score: 36 - 38  ?? Ruxolitinib 10 mg BID; Hydrea was stopped     05/17/19: Ruxolitinib 20 mg BID  ?? LDH: 1.30 x ULN  05/31/19: Ruxolitinib 15 mg BID (decrease due to anemia of 10.6)    07/12/19: Ruxolitinib 10 mg BID (decrease due to anemia of 10.4, plts 477); added Hydrea 500 mg a day   ?? CD34: 0.12  ?? LDH: 1.42 x ULN    10/25/19: Ruxolitinib 5 mg BID (continued anemia); Hydrea at 500 mg   ?? LDH: 1.29 x ULN    INTERVAL HX:  Christy Buchanan returned in FU of her ET.  She has been on ruxolitinib and Hydrea.      Her symptom score is below.  We discussed the following:   ?? Sleep: This continues to be her biggest symptom.  We reviewed her sleep schedule, which is 10 PM to 4 AM or 10 PM to 6 AM.  She feels reasonably refreshed upon waking.  After much discussion, she felt that sleep was not contributing to her fatigue.    ?? Stress: She notes significant chronic stress due to COVID.  She has started meditating. She is not exercising   ?? Itching: She had a rapid decline in itching with the introduction of ruxolitinib.  However, she has had a recent bloom of itching.  She is treating it with Zyrtec.  This has been the pattern of her itching with intermittent episodes.  It is not clear if she improved because of ruxolitinib or because of the natural hx.  Overall, she feels this sx is better with treatment.   ?? Early Satiety: She continues to have early satiety.  This is a long standing issue.     MPN 10 Score  Symptom (Absent 0 to 10 worst imaginable) 2/03 2/24 4/21 6/02 9/15      Fatigue in the past 24 hours 6 9 7 9 7       Filling up quickly when you eat (Early satiety)  7 7 5 5 5       Abdominal discomfort  3 4 4 4 4       Inactivity 5 5 - 6 5 7 5       Problems with concentration - 6 7-8 6 7 6       Numbness/ Tingling (in my hands and feet) 5 to 6 0 0 0 0      Night sweats 0 0 1 0 0      Itching (pruritus) 10 7 0 0 3      Bone pain (diffuse not joint pain or arthritis) 6 6 3  0 1      Fever (>100 F) 0 0 0 0 0      Unintentional weight loss last 6 months 0 0 0 0 0      TOTAL 48 - 49 36 - 38 27 32 31        PHYSICAL EXAM:  VS: As recorded above  GENERAL: Appears well  HEENT: Plates; NP is patent; OP is clear  LYMPH NODES: No significant LAN  LUNGS:Dim   COR:RRR w/o m/r/g  ABD: NTND; no HSM;   EXT: No edema  PMHx  ??? 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 no malignancy; neg LN  o Txed with Surgery, radiation, auto BMT, raloxifene  o Negative WU  for 28 genes associated with hereditary cancers  o Raloxifene  ??? Sjogren's syndrome  ??? Autoimmune thyroiditis - Txed with methimazole   ??? GERD - Txed with PPI  ??? Mixed hyperlipidemia - Tx with Lovaza, pravastatin     FHx:  ?? Daughter w/ spina bifida, BRCA 1081G>A mutation (w/o cancer)      2016 WHO Diagnostic Criteria for MPNs  ET  Major Criteria  ??? Plts > 450  ??? BM biopsy showing proliferation mainly of the megakaryocyte lineage with increased numbers of enlarged, mature megakaryocytes with hyperlobulated nuclei. No significant left-shift of neutrophil granulopoiesis or erythropoiesis and very rarely minor (grade 1) increase in reticulin fibers  ??? Not meeting WHO criteria for BCR-ABL1?+?CML, PV, PMF, MDS, or other myeloid neoplasms)  ??? Presence of JAK2, CALR or MPL mutation    Minor Criteria  ??? Presence of a clonal marker (e.g., abnormal karyotype)  ??? Absence of evidence for reactive thrombocytosis    Diagnosis: All major criteria or first three major and one minor     PMF  Major criteria  ??? Proliferation and atypia of megakaryocytes accompanied by either reticulin and/or collagen fibrosis  grades 2 or 3 on a scale of 0 to 3  ??? Not meeting WHO criteria for ET, PV, BCR-ABL1+ CML, myelodysplastic syndromes, or other myeloid neoplasm  ??? Presence of JAK2, CALR or MPL mutation or in the absence of these mutations, presence of another clonal marker or absence of reactive myelofibrosis    Minor Criteria  ??? Anemia not attributed to another comorbid condition  ??? WBC > 11.0  ??? Palpable Splenomegaly   ??? LDH > ULN  ??? Leukoerythroblastosis     Diagnosis: 3 major and at least 1 minor confirmed in two consecutive determinations    IPSET Model for Prediction of Thrombosis    Age:    2   < 60: 0   > 60: 2  WBC count (x10e9)  0   < 11: 0   > 11: 1  Hx of Thrombosis  0   No: 0   Yes: 1  --------------------------------------------  Total:    2     10 yr thrombosis risk, median survival (yrs)  Lo Risk: 0 pts    --> 89%, NR  Int Risk: 1-2 pts --> 86%, 24.5  Hi Risk: 3-4 pts  --> 69%, 14.7    IPSET II  Age:    1   < 60: 0   > 60: 1  CVS Risk Factor*  0   No: 0   Yes: 1   Hx of Thrombosis  0   No: 0   Yes: 2  JAK2     2   No: 0   Yes: 2  --------------------------------------------  Total:    3    *Gave up smoking 10 years ago, near optimal non-LDL on treatment, non HTN    Annual Thrombosis Risk  Lo Risk: 0-1 pts  --> 1.03%  Int Risk: 2 pts     --> 2.35%  Hi Risk: 3-6 pts  --> 3.56%     NOTE  ?? JAK2 conveys a higher risk of thrombosis  ?? CALR conveys a higher risk of bleeding    Newer Model:  [email or web address]  Primary prophylaxis of thrombosis  All patients: Aspirin unless   ?????? VWF activity <30%,  ?????? Platelet >?1 million,  ?????? CALR-mutated low-risk ET    PV with Hct >?45%: Add phlebotomy/cytoreduction to target Hct <?45%  Age >?60 years and/or prior history of thrombosis: Add cytoreduction  Secondary prophylaxis after thrombotic event  ?All patients: Cytoreduction  ?Typical VTE: Consider indefinite VKA for most patients; aspirin if not on VKA  ?Atypical VTE: Indefinite VKA  ?Arterial thrombosis: Aspirin

## 2019-10-27 MED FILL — JAKAFI 5 MG TABLET: 30 days supply | Qty: 120 | Fill #0 | Status: AC

## 2019-10-31 NOTE — Unmapped (Signed)
Completed.

## 2019-10-31 NOTE — Unmapped (Signed)
Molli Hazard -  4 month return appt with Dr. Zenaida Niece has labs on the day before. Can you pls add same day as visit?    Thanks

## 2019-11-27 NOTE — Unmapped (Signed)
The [facility] has made a second and final attempt to reach this patient to refill the following medication:Jakafi 5mg .      We have left voicemails on the following phone numbers: [phone] and have sent a MyChart message.    Dates contacted: 10/14,18  Last scheduled delivery: 10/27/19    The patient may be at risk of non-compliance with this medication. The patient should call the [facility] at [phone] (option 4) to refill medication.    Christy Buchanan Hulda Humphrey   [facility] Specialty Technician

## 2019-11-27 NOTE — Unmapped (Signed)
[facility] Refill Coordination Note    Specialty Medication(s) to be Shipped:   Hematology/Oncology: Christy Buchanan    Other medication(s) to be shipped: No additional medications requested for fill at this time     Christy Buchanan, DOB: 19-Jan-1956  Phone: [phone] (home)       All above HIPAA information was verified with patient.     Was a Nurse, learning disability used for this call? No    Completed refill call assessment today to schedule patient's medication shipment from the [facility] [phone]).       Specialty medication(s) and dose(s) confirmed: Patient reports changes to the regimen as follows: Jakafi 5mg - 1 tablet in am and 2 tabs in pm   Changes to medications: Rily reports no changes at this time.  Changes to insurance: No  Questions for the pharmacist: No    Confirmed patient received Welcome Packet with first shipment. The patient will receive a drug information handout for each medication shipped and additional FDA Medication Guides as required.       DISEASE/MEDICATION-SPECIFIC INFORMATION        N/A    SPECIALTY MEDICATION ADHERENCE     Medication Adherence    Patient reported X missed doses in the last month: 0  Specialty Medication: Jafaki 5mg   Patient is on additional specialty medications: No  Informant: patient                Jakafi 5 mg: 10 days of medicine on hand       SHIPPING     Shipping address confirmed in Epic.     Delivery Scheduled: Yes, Expected medication delivery date: 12/04/19.     Medication will be delivered via Same Day Courier to the prescription address in Epic W[facility].    [name]   [facility] Specialty Technician

## 2019-12-04 MED FILL — JAKAFI 5 MG TABLET: ORAL | 30 days supply | Qty: 120 | Fill #1

## 2019-12-04 MED FILL — JAKAFI 5 MG TABLET: 30 days supply | Qty: 120 | Fill #1 | Status: AC

## 2019-12-14 DIAGNOSIS — E063 Autoimmune thyroiditis: Principal | ICD-10-CM

## 2019-12-14 MED ORDER — METHIMAZOLE 5 MG TABLET
ORAL_TABLET | 0 refills | 0 days
Start: 2019-12-14 — End: ?

## 2019-12-15 MED ORDER — METHIMAZOLE 5 MG TABLET
ORAL_TABLET | 0 refills | 0.00000 days | Status: CP
Start: 2019-12-15 — End: 2019-12-20

## 2019-12-19 DIAGNOSIS — E063 Autoimmune thyroiditis: Principal | ICD-10-CM

## 2019-12-20 MED ORDER — METHIMAZOLE 5 MG TABLET
ORAL_TABLET | 1 refills | 0 days | Status: CP
Start: 2019-12-20 — End: 2020-03-06

## 2019-12-22 DIAGNOSIS — D473 Essential (hemorrhagic) thrombocythemia: Principal | ICD-10-CM

## 2019-12-22 MED ORDER — HYDROXYUREA 500 MG CAPSULE
ORAL_CAPSULE | Freq: Every day | ORAL | 0 refills | 90 days | Status: CN
Start: 2019-12-22 — End: 2020-12-21

## 2019-12-22 NOTE — Unmapped (Signed)
Patient has enough medication on hand, rescheduling refill call for 12/3

## 2020-01-09 DIAGNOSIS — D473 Essential (hemorrhagic) thrombocythemia: Principal | ICD-10-CM

## 2020-01-09 MED ORDER — HYDROXYUREA 500 MG CAPSULE
ORAL_CAPSULE | Freq: Every day | ORAL | 0 refills | 90.00000 days | Status: CN
Start: 2020-01-09 — End: 2021-01-08

## 2020-01-09 NOTE — Unmapped (Signed)
Please refill if appropriate

## 2020-01-10 DIAGNOSIS — D473 Essential (hemorrhagic) thrombocythemia: Principal | ICD-10-CM

## 2020-01-10 MED ORDER — HYDROXYUREA 500 MG CAPSULE: 500 mg | capsule | Freq: Every day | 11 refills | 30 days | Status: AC

## 2020-01-10 MED ORDER — HYDROXYUREA 500 MG CAPSULE
ORAL_CAPSULE | Freq: Every day | ORAL | 11 refills | 30.00000 days | Status: CP
Start: 2020-01-10 — End: 2021-01-09
  Filled 2020-01-15: qty 30, 30d supply, fill #0

## 2020-01-10 NOTE — Unmapped (Signed)
[facility] Refill Coordination Note    Specialty Medication(s) to be Shipped:   Hematology/Oncology: Hydroxyurea and Jakafi    Other medication(s) to be shipped: No additional medications requested for fill at this time     Christy Buchanan, DOB: Dec 12, 1955  Phone: [phone] (home)       All above HIPAA information was verified with patient.     Was a Nurse, learning disability used for this call? No    Completed refill call assessment today to schedule patient's medication shipment from the [facility] [phone]).       Specialty medication(s) and dose(s) confirmed: Regimen is correct and unchanged.   Changes to medications: [name] reports no changes at this time.  Changes to insurance: No  Questions for the pharmacist: No    Confirmed patient received Welcome Packet with first shipment. The patient will receive a drug information handout for each medication shipped and additional FDA Medication Guides as required.       DISEASE/MEDICATION-SPECIFIC INFORMATION        N/A    SPECIALTY MEDICATION ADHERENCE     Medication Adherence    Patient reported X missed doses in the last month: 0  Specialty Medication: Hydroxyurea 500mg   Patient is on additional specialty medications: Yes  Additional Specialty Medications: Jakafi 5mg   Patient Reported Additional Medication X Missed Doses in the Last Month: 1  Patient is on more than two specialty medications: No       Hydroxyurea 500 mg: 7 days of medicine on hand   Jakafi 5 mg: 12 days of medicine on hand     SHIPPING     Shipping address confirmed in Epic.     Delivery Scheduled: Yes, Expected medication delivery date: 01/15/2020.     Medication will be delivered via Same Day Courier to the prescription address in Epic W[facility].    Christy Buchanan   [facility] Specialty Technician

## 2020-01-15 MED FILL — HYDROXYUREA 500 MG CAPSULE: 30 days supply | Qty: 30 | Fill #0 | Status: AC

## 2020-01-15 MED FILL — JAKAFI 5 MG TABLET: ORAL | 30 days supply | Qty: 120 | Fill #2

## 2020-01-15 MED FILL — JAKAFI 5 MG TABLET: 30 days supply | Qty: 120 | Fill #2 | Status: AC

## 2020-01-31 NOTE — Unmapped (Signed)
[facility] Refill Coordination Note    Specialty Medication(s) to be Shipped:   Hematology/Oncology: Hydroxyurea and Jakafi    Other medication(s) to be shipped: No additional medications requested for fill at this time     Christy Buchanan, DOB: 1955/05/10  Phone: [phone] (home)       All above HIPAA information was verified with patient.     Was a Nurse, learning disability used for this call? No    Completed refill call assessment today to schedule patient's medication shipment from the [facility] [phone]).       Specialty medication(s) and dose(s) confirmed: Regimen is correct and unchanged.   Changes to medications: Audrielle reports no changes at this time.  Changes to insurance: No  Questions for the pharmacist: No    Confirmed patient received Welcome Packet with first shipment. The patient will receive a drug information handout for each medication shipped and additional FDA Medication Guides as required.       DISEASE/MEDICATION-SPECIFIC INFORMATION        N/A    SPECIALTY MEDICATION ADHERENCE     Medication Adherence    Patient reported X missed doses in the last month: 0  Specialty Medication: Hydroxyurea 500mg   Patient is on additional specialty medications: Yes  Additional Specialty Medications: Jakafi 5mg   Patient Reported Additional Medication X Missed Doses in the Last Month: 0  Patient is on more than two specialty medications: No  Any gaps in refill history greater than 2 weeks in the last 3 months: no  Demonstrates understanding of importance of adherence: yes  Informant: patient  Reliability of informant: reliable  Confirmed plan for next specialty medication refill: delivery by pharmacy  Refills needed for supportive medications: not needed       Hydroxyurea 500 mg: 14 days of medicine on hand   Jakafi 5 mg: 14 days of medicine on hand     SHIPPING     Shipping address confirmed in Epic.     Delivery Scheduled: Yes, Expected medication delivery date: 02/13/2019. Medication will be delivered via Same Day Courier to the prescription address in Epic WAM.    Tabrina Esty D Wilhelm Ganaway   [facility] Specialty Technician

## 2020-02-10 DIAGNOSIS — G912 (Idiopathic) normal pressure hydrocephalus: Secondary | ICD-10-CM

## 2020-02-10 HISTORY — DX: (Idiopathic) normal pressure hydrocephalus: G91.2

## 2020-02-13 DIAGNOSIS — D473 Essential (hemorrhagic) thrombocythemia: Principal | ICD-10-CM

## 2020-02-14 NOTE — Unmapped (Signed)
Spoke with Christy Buchanan about moving her appointment with Dr. [name] from 1/6 to 1/20

## 2020-02-14 NOTE — Unmapped (Signed)
Christy Buchanan 's Jakafi shipment will be delayed as a result of a high copay.     I have reached out to the patient and communicated the delay. We will call the patient back to reschedule the delivery upon resolution. We have not confirmed the new delivery date.

## 2020-02-20 MED FILL — HYDROXYUREA 500 MG CAPSULE: ORAL | 30 days supply | Qty: 30 | Fill #1

## 2020-02-20 MED FILL — JAKAFI 5 MG TABLET: ORAL | 30 days supply | Qty: 120 | Fill #3

## 2020-02-29 ENCOUNTER — Other Ambulatory Visit: Admit: 2020-02-29 | Discharge: 2020-03-01 | Payer: MEDICARE

## 2020-02-29 ENCOUNTER — Ambulatory Visit
Admit: 2020-02-29 | Discharge: 2020-03-01 | Payer: MEDICARE | Attending: Hematology & Oncology | Primary: Hematology & Oncology

## 2020-02-29 DIAGNOSIS — D471 Chronic myeloproliferative disease: Principal | ICD-10-CM

## 2020-02-29 DIAGNOSIS — D473 Essential (hemorrhagic) thrombocythemia: Principal | ICD-10-CM

## 2020-02-29 LAB — CBC W/ AUTO DIFF
BASOPHILS ABSOLUTE COUNT: 0 10*9/L (ref 0.0–0.1)
BASOPHILS RELATIVE PERCENT: 0.4 %
EOSINOPHILS ABSOLUTE COUNT: 0 10*9/L (ref 0.0–0.4)
EOSINOPHILS RELATIVE PERCENT: 0.5 %
HEMATOCRIT: 34 % — ABNORMAL LOW (ref 36.0–46.0)
HEMOGLOBIN: 11.1 g/dL — ABNORMAL LOW (ref 12.0–16.0)
LARGE UNSTAINED CELLS: 2 % (ref 0–4)
LYMPHOCYTES ABSOLUTE COUNT: 1.6 10*9/L (ref 1.5–5.0)
LYMPHOCYTES RELATIVE PERCENT: 34.3 %
MEAN CORPUSCULAR HEMOGLOBIN CONC: 32.8 g/dL (ref 31.0–37.0)
MEAN CORPUSCULAR HEMOGLOBIN: 39.5 pg — ABNORMAL HIGH (ref 26.0–34.0)
MEAN CORPUSCULAR VOLUME: 120.7 fL — ABNORMAL HIGH (ref 80.0–100.0)
MEAN PLATELET VOLUME: 8.6 fL (ref 7.0–10.0)
MONOCYTES ABSOLUTE COUNT: 0.2 10*9/L (ref 0.2–0.8)
MONOCYTES RELATIVE PERCENT: 4.8 %
NEUTROPHILS ABSOLUTE COUNT: 2.7 10*9/L (ref 2.0–7.5)
NEUTROPHILS RELATIVE PERCENT: 57.7 %
PLATELET COUNT: 348 10*9/L (ref 150–440)
RED BLOOD CELL COUNT: 2.82 10*12/L — ABNORMAL LOW (ref 4.00–5.20)
RED CELL DISTRIBUTION WIDTH: 14.5 % (ref 12.0–15.0)
WBC ADJUSTED: 4.6 10*9/L (ref 4.5–11.0)

## 2020-02-29 LAB — CD34 ENUMERATION
CD34*: 0.02 %
CD45*: 100 %
PRE-APHERESIS 34: 1 {cells}/uL

## 2020-02-29 LAB — LACTATE DEHYDROGENASE: LACTATE DEHYDROGENASE: 342 U/L — ABNORMAL HIGH (ref 120–246)

## 2020-02-29 LAB — SLIDE REVIEW

## 2020-02-29 MED ORDER — RUXOLITINIB 5 MG TABLET
ORAL_TABLET | ORAL | 11 refills | 30 days | Status: CP
Start: 2020-02-29 — End: ?
  Filled 2020-04-08: qty 90, 30d supply, fill #0

## 2020-02-29 NOTE — Unmapped (Signed)
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  At the time of her diagnosis, she had no circulating immature or CD34+ cells, no increase in fibrosis, a normal LDH, and no palpable splenomegaly.      Since her diagnosis, there has been an increase in her LDH and CD34 count.  She also had a significant MPN symptom score.  As a result, she was started on ruxolitinib.  This treatment reduced her symptom score by almost 50%.  She did develop anemia, which was treated by reducing her ruxolitinib.  The dose reduction did not lead to an increase in her symptom score; it did lead to a near normalization of her hemoglobin. This was paired with a rise in her platelets that I have treated by adding low dose hydroxyurea.  I have explained that the combination is somewhat unorthodox.  Christy Moyd understands (and even suggested we do so based on conversations with MPN patients on line.     Presumably, lower doses of two drugs should have fewer long term side effects then higher doses of one.  Nevertheless, she was encouraged to get a dermatologist.  Her risk for skin cancer is high based on her skin type alone.      I should point out that she is a poor interferon candidate based on her h/o autoimmune disease    She was encouraged to continue her mindfulness.  Once again, we talked about the value of exercise.      She has an intermediate risk for thrombosis by IPSET1 and high risk by IPSET2.  Dailey aspirin and cytoreduction are appropriate.  Of note, CV risk factors for this calculation include diabetes, hypertension, and active smoking.  She has not smoked in 10 years; her only other risk factor is hyperlipidemia, which is reasonably well controlled.  Even without CV risk factors, she is still considered high risk by the IPSET2.      PLAN:  1) Continue ruxolitinib 5 mg BID  2) Continue Hydrea 500 mg every day  3) RTC in 6 months; check LDH, CD34, CBC, CMP at next visit  4) Recommend seeing a dermatologist  5) Continue to recommend graduated exercise.     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 CBC: 7.3/12.0/312  ?? LDH: WNL  ?? CD34: 0.02  ?? VWF: Normal   ?? MPN score: 48 to 49  ?? Began ruxolitinib 10 mg BID    04/05/19  ?? CBC: 7.3/12.0/312  ?? MPN score: 36 - 38  ?? Ruxolitinib 10 mg BID; Hydrea was stopped     05/17/19: Ruxolitinib 20 mg BID  ?? LDH: 1.30 x ULN  ?? MPN Score: 27    05/31/19: Ruxolitinib 15 mg BID (decrease due to anemia of 10.6)    07/12/19: Ruxolitinib 10 mg BID (decrease due to anemia of 10.4, plts 477);  added Hydrea 500 mg a day   ?? CD34: 0.12  ?? LDH: 1.42 x ULN  ?? MPN Score: 32    10/25/19: Ruxolitinib 5 mg BID (continued anemia); Hydrea at 500 mg   ?? LDH: 1.29 x ULN  ?? MPN Score: 31    03/01/19:  Ruxolitinib 5 mg BID; Hydrea at 500 mg   ?? LDH: 1.39 x ULN  ?? MPN Score: 23  ??  INTERVAL HX:  Christy Mchaney is doing well.  She notes improvement in her fatigue, which she attributes to meditation and mindfulness. She has found an Solicitor that has been quite effective.  She notes less anxiety.      She does note that her fatigue seems to have a cyclical nature.  She seems to go three weeks before having a crash. Her most recent crash happened in the past couple of days - hence the two columns in her MPN score.  Of note,the last week column is more indicative of how she feels. She is not sure what triggers the crashes.      We discussed a number of sx  ?? Sleep: This is going substantially better; she notes only rarely getting up without feeling refreshed.   ?? Stress: She is doing much better with her stress level  ?? Pruritus:  She feels itching secondary to her MPN is doing well.  She does note dry skin.   ?? Early Satiety:  This column generally gets a 5 though it is not due to splenomegaly.  She feels a fullness in her  LUQ.  I will continue to score it, but it does not reflect her MPN  ?? GI: She noted some diarrhea and loose stools of the past few days.  She denies any exposures.  She attributes this to a type of milk she rarely uses.   ?? ROS: The remainder of her ROS was negative.     MPN 10 Score  Symptom (Absent 0 to 10 worst imaginable) 2/21 2/21 4/21 6/21 9/21 ??last week ??1/22 ??   Fatigue in the past 24 hours 6 9 7 9 7 5  ??8 ??   Filling up quickly when you eat (Early satiety)  7 7 5 5 5  ??feels fat - ? 5 ??   Abdominal discomfort  3 4 4 4 4  ??3 5?? ??   Inactivity 5 5 - 6 5 7 5  ??4 4?? ??   Problems with concentration - 6 7-8 6 7 6  ??3 2?? ??   Numbness/ Tingling (in my hands and feet) 5 to 6 0 0 0 0 ??0 0?? ??   Night sweats 0 0 1 0 0 ??0 0?? ??   Itching (pruritus) 10 7 0 0 3 ??3 3?? ??   Bone pain (diffuse not joint pain or arthritis) 6 6 3  0 1 ??0 0?? ??   Fever (>100 F) 0 0 0 0 0 0?? 0?? ??   Unintentional weight loss last 6 months 0 0 0 0 0 ??0 0?? ??   TOTAL 48 - 49 36 - 38 27 32 31 23?? 27?? ??     PHYSICAL EXAM:  VS: As recorded above  GENERAL: Appears well  HEENT:Unremarkable  LYMPH NODES: No significant LAN  LUNGS: CTA  COR:RRR  ABD: NTND; no HSM  EXT:No edema  NEURO:Fluent conversation; coordinated movement  DERM: Mild xerosis; sites of possible pre-malignancies    PMHx  ??? 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 no malignancy; neg LN  o Txed with Surgery, radiation, auto BMT, raloxifene  o Negative WU  for 28 genes associated with hereditary cancers  o Raloxifene  ??? Sjogren's syndrome  ??? Autoimmune thyroiditis - Txed with methimazole   ??? GERD - Txed with PPI  ??? Mixed hyperlipidemia - Tx with Lovaza, pravastatin     FHx:  ?? Daughter w/ spina bifida, BRCA 1081G>A mutation (w/o cancer)      2016 WHO Diagnostic Criteria for MPNs  ET  Major Criteria  ??? Plts > 450  ??? BM biopsy showing proliferation mainly of the megakaryocyte lineage with increased numbers of enlarged, mature megakaryocytes with hyperlobulated nuclei. No significant left-shift of neutrophil granulopoiesis or erythropoiesis and very rarely minor (grade 1) increase in reticulin fibers  ??? Not meeting WHO criteria for BCR-ABL1?+?CML, PV, PMF, MDS, or other myeloid neoplasms)  ??? Presence of JAK2, CALR or MPL mutation    Minor Criteria  ??? Presence of a clonal marker (e.g., abnormal karyotype)  ??? Absence of evidence for reactive thrombocytosis    Diagnosis: All major criteria or first three major and one minor     PMF  Major criteria  ??? Proliferation and atypia of megakaryocytes accompanied by either reticulin and/or collagen fibrosis  grades 2 or 3 on a scale of 0 to 3  ??? Not meeting WHO criteria for ET, PV, BCR-ABL1+ CML, myelodysplastic syndromes, or other myeloid neoplasm  ??? Presence of JAK2, CALR or MPL mutation or in the absence of these mutations, presence of another clonal marker or absence of reactive myelofibrosis    Minor Criteria  ??? Anemia not attributed to another comorbid condition  ??? WBC > 11.0  ??? Palpable Splenomegaly   ??? LDH > ULN  ??? Leukoerythroblastosis     Diagnosis: 3 major and at least 1 minor confirmed in two consecutive determinations    IPSET Model for Prediction of Thrombosis    Age:    2   < 60: 0   > 60: 2  WBC count (x10e9)  0   < 11: 0   > 11: 1  Hx of Thrombosis  0   No: 0   Yes: 1  --------------------------------------------  Total:    2     10 yr thrombosis risk, median survival (yrs)  Lo Risk: 0 pts    --> 89%, NR  Int Risk: 1-2 pts --> 86%, 24.5  Hi Risk: 3-4 pts  --> 69%, 14.7    IPSET II  Age:    1   < 60: 0   > 60: 1  CVS Risk Factor*  0   No: 0   Yes: 1   Hx of Thrombosis  0   No: 0   Yes: 2  JAK2     2   No: 0   Yes: 2  --------------------------------------------  Total:    3    *Gave up smoking 10 years ago, near optimal non-LDL on treatment, non HTN    Annual Thrombosis Risk  Lo Risk: 0-1 pts  --> 1.03%  Int Risk: 2 pts     --> 2.35%  Hi Risk: 3-6 pts  --> 3.56%     NOTE  ?? JAK2 conveys a higher risk of thrombosis  ?? CALR conveys a higher risk of bleeding    Newer Model:  [email or web address]    Primary prophylaxis of thrombosis  All patients: Aspirin unless   ??????  VWF activity <30%,  ?????? Platelet >?1 million,  ?????? CALR-mutated low-risk ET    PV with Hct >?45%: Add phlebotomy/cytoreduction to target Hct <?45%  Age >?60 years and/or prior history of thrombosis: Add cytoreduction  Secondary prophylaxis after thrombotic event  ?All patients: Cytoreduction  ?Typical VTE: Consider indefinite VKA for most patients; aspirin if not on VKA  ?Atypical VTE: Indefinite VKA  ?Arterial thrombosis: Aspirin

## 2020-02-29 NOTE — Unmapped (Addendum)
The cream that I generally recommend is Udderly Smooth.     Hydrea: Long term problems include skin cancers. Having a dermatologist is important  Jakofi: It is immune suppressing.      Low doses of both are an advantage.     Anemia:   You do not have this problem off Jakofi.  Options   - No Jakofi   - Switch to interferon - tough from an autoimmune disorders worse.   Baxter Hire + luspatercept: This is a shot given every three weeks   - Baxter Hire + androgens:

## 2020-03-01 NOTE — Unmapped (Signed)
Clinical Assessment Needed For: Dose Change  Medication: Jakafi 5mg  tablet  Last Fill Date/Day Supply: 02/20/20 / 30 days  Refill Too Soon until 03/12/20  Was previous dose already scheduled to fill: No    Notes to Pharmacist:

## 2020-03-06 DIAGNOSIS — E063 Autoimmune thyroiditis: Principal | ICD-10-CM

## 2020-03-06 MED ORDER — METHIMAZOLE 5 MG TABLET
ORAL_TABLET | 1 refills | 0 days | Status: CP
Start: 2020-03-06 — End: ?

## 2020-03-08 NOTE — Unmapped (Signed)
[facility] Clinical Assessment & Refill Coordination Note    Christy Buchanan, DOB: August 06, 1955  Phone: [phone] (home)     All above HIPAA information was verified with patient.     Was a Nurse, learning disability used for this call? No    Specialty Medication(s):   Hematology/Oncology: Hydroxyurea and Jakafi     Current Outpatient Medications   Medication Sig Dispense Refill   ??? aspirin (ECOTRIN) 81 MG tablet Take 81 mg by mouth.     ??? atorvastatin (LIPITOR) 40 MG tablet Take 1 tablet (40 mg total) by mouth daily. 30 tablet 11   ??? cetirizine (ZYRTEC) 10 MG tablet Take 10 mg by mouth every other day.      ??? hydroxyurea (HYDREA) 500 mg capsule Take 1 capsule (500 mg total) by mouth daily. 30 capsule 11   ??? meTHIMazole (TAPAZOLE) 5 MG tablet Take 1/2 tablet every other day 30 tablet 1   ??? multivitamin (MULTIVITAMIN) per tablet Take 1 tablet by mouth daily.      ??? omega-3 acid ethyl esters (LOVAZA) 1 gram capsule Take 1 g by mouth.     ??? omeprazole (PRILOSEC) 20 MG capsule Take 1 capsule (20 mg total) by mouth daily. TAKE 1 CAPSULE(20 MG) BY MOUTH DAILY 90 capsule 2   ??? ondansetron (ZOFRAN) 4 MG tablet Take 1 tablet (4 mg total) by mouth daily as needed for nausea. 30 tablet 2   ??? PARoxetine (PAXIL) 20 MG tablet TAKE 1 TABLET BY MOUTH EVERY DAY 30 tablet 0   ??? polyethylene glycol (MIRALAX) 17 gram packet Take 17 g by mouth daily.      ??? raloxifene (EVISTA) 60 mg tablet Take 1 tablet (60 mg total) by mouth daily. 90 tablet 3   ??? ruxolitinib (JAKAFI) 5 mg tablet Take 1 tablet (5 mg total) by mouth every morning AND 2 tablets (10 mg total) every evening. 90 tablet 11     No current facility-administered medications for this visit.        Changes to medications: Christy Buchanan reports no changes at this time.    No Known Allergies    Changes to allergies: No    SPECIALTY MEDICATION ADHERENCE     Jakafi 5 mg: 30 days of medicine on hand   HU 500 mg: 11 days of medicine on hand     Medication Adherence Patient reported X missed doses in the last month: 0  Specialty Medication: Jakafi 5 mg AM and 10 mg QPM  Patient is on additional specialty medications: Yes  Additional Specialty Medications: HU 500 mg daily  Patient Reported Additional Medication X Missed Doses in the Last Month: 0  Patient is on more than two specialty medications: No  Informant: patient  Confirmed plan for next specialty medication refill: delivery by pharmacy          Specialty medication(s) dose(s) confirmed: Jakafi dose decreased to 5 mg AM and 10 mg PM     Are there any concerns with adherence? No    Adherence counseling provided? Not needed    CLINICAL MANAGEMENT AND INTERVENTION      Clinical Benefit Assessment:    Do you feel the medicine is effective or helping your condition? Yes    Clinical Benefit counseling provided? Not needed    Adverse Effects Assessment:    Are you experiencing any side effects? No    Are you experiencing difficulty administering your medicine? No    Quality of Life Assessment:  How many days over the past month did your Essential thrombocythemia  keep you from your normal activities? For example, brushing your teeth or getting up in the morning. 0    Have you discussed this with your provider? Not needed    Therapy Appropriateness:    Is therapy appropriate? Yes, therapy is appropriate and should be continued    DISEASE/MEDICATION-SPECIFIC INFORMATION      N/A    PATIENT SPECIFIC NEEDS     - Does the patient have any physical, cognitive, or cultural barriers? No    - Is the patient high risk? Yes, patient is taking oral chemotherapy. Appropriateness of therapy as been assessed    - Does the patient require a Care Management Plan? No     - Does the patient require physician intervention or other additional services (i.e. nutrition, smoking cessation, social work)? No      SHIPPING     Specialty Medication(s) to be Shipped:   Hematology/Oncology: Hydroxyurea    Other medication(s) to be shipped: No additional medications requested for fill at this time     Changes to insurance: No    Delivery Scheduled: Yes, Expected medication delivery date: 03/15/20.     Medication will be delivered via Next Day Courier to the confirmed prescription address in [facility].    The patient will receive a drug information handout for each medication shipped and additional FDA Medication Guides as required.  Verified that patient has previously received a Conservation officer, historic buildings.    All of the patient's questions and concerns have been addressed.    Breck Coons [facility] Specialty Pharmacist

## 2020-03-12 DIAGNOSIS — D473 Essential (hemorrhagic) thrombocythemia: Principal | ICD-10-CM

## 2020-03-14 MED FILL — HYDROXYUREA 500 MG CAPSULE: ORAL | 30 days supply | Qty: 30 | Fill #2

## 2020-03-14 NOTE — Unmapped (Signed)
Therapy Update Follow Up: No issues - Copay = $0

## 2020-03-31 MED ORDER — ATORVASTATIN 40 MG TABLET
ORAL_TABLET | Freq: Every day | ORAL | 11 refills | 30 days
Start: 2020-03-31 — End: 2021-03-31

## 2020-04-02 NOTE — Unmapped (Signed)
[facility] Refill Coordination Note    Specialty Medication(s) to be Shipped:   Hematology/Oncology: Christy Buchanan and Hydroxyuera 500mg     Other medication(s) to be shipped: No additional medications requested for fill at this time     Christy Buchanan, DOB: 1955/05/15  Phone: [phone] (home)       All above HIPAA information was verified with patient.     Was a Nurse, learning disability used for this call? No    Completed refill call assessment today to schedule patient's medication shipment from the [facility] [phone]).       Specialty medication(s) and dose(s) confirmed: Regimen is correct and unchanged.   Changes to medications: Nicholl reports no changes at this time.  Changes to insurance: No  Questions for the pharmacist: No    Confirmed patient received Welcome Packet with first shipment. The patient will receive a drug information handout for each medication shipped and additional FDA Medication Guides as required.       DISEASE/MEDICATION-SPECIFIC INFORMATION        N/A    SPECIALTY MEDICATION ADHERENCE     Medication Adherence    Patient reported X missed doses in the last month: 0  Specialty Medication: Hydroxyurea 500mg   Patient is on additional specialty medications: Yes  Additional Specialty Medications: Jakafi 5mg   Patient Reported Additional Medication X Missed Doses in the Last Month: 0  Patient is on more than two specialty medications: No                Hydroxyurea 500 mg: 11 days of medicine on hand   Jakafi 5 mg: 11 days of medicine on hand         SHIPPING     Shipping address confirmed in Epic.     Delivery Scheduled: Yes, Expected medication delivery date: 04/09/20.     Medication will be delivered via Next Day Courier to the prescription address in Epic WAM.    Nancy Nordmann [facility] Specialty Technician

## 2020-04-08 MED FILL — HYDROXYUREA 500 MG CAPSULE: ORAL | 30 days supply | Qty: 30 | Fill #3

## 2020-04-29 NOTE — Unmapped (Signed)
[facility] Refill Coordination Note    Specialty Medication(s) to be Shipped:   Hematology/Oncology: Hydroxyurea    Other medication(s) to be shipped: No additional medications requested for fill at this time       **pt denied refill for [facility] @ this time due to having 30 days of med still on hand**     Christy Buchanan, DOB: 06-27-1955  Phone: [phone] (home)       All above HIPAA information was verified with patient.     Was a Nurse, learning disability used for this call? No    Completed refill call assessment today to schedule patient's medication shipment from the [facility] [phone]).       Specialty medication(s) and dose(s) confirmed: Regimen is correct and unchanged.   Changes to medications: Christy Buchanan reports no changes at this time.  Changes to insurance: No  Questions for the pharmacist: No    Confirmed patient received Welcome Packet with first shipment. The patient will receive a drug information handout for each medication shipped and additional FDA Medication Guides as required.       DISEASE/MEDICATION-SPECIFIC INFORMATION        N/A    SPECIALTY MEDICATION ADHERENCE     Medication Adherence    Patient reported X missed doses in the last month: 0  Specialty Medication: Hydroxyurea 500mg   Patient is on additional specialty medications: Yes  Additional Specialty Medications: Jakafi 5mg   Patient is on more than two specialty medications: No  Any gaps in refill history greater than 2 weeks in the last 3 months: no  Demonstrates understanding of importance of adherence: yes  Informant: patient  Reliability of informant: reliable  Provider-estimated medication adherence level: good  Patient is at risk for Non-Adherence: No                hydroxyurea 500 mg: 14 days of medicine on hand   jakafi 5 mg: 30 days of medicine on hand -denied refill @ this time for this medication      SHIPPING     Shipping address confirmed in Epic.     Delivery Scheduled: Yes, Expected medication delivery date: 03/29.     Medication will be delivered via Next Day Courier to the prescription address in Epic WAM.    Antonietta Barcelona   [facility] Specialty Technician

## 2020-05-06 DIAGNOSIS — D473 Essential (hemorrhagic) thrombocythemia: Principal | ICD-10-CM

## 2020-05-06 DIAGNOSIS — E039 Hypothyroidism, unspecified: Principal | ICD-10-CM

## 2020-05-06 DIAGNOSIS — D471 Chronic myeloproliferative disease: Principal | ICD-10-CM

## 2020-05-06 MED FILL — HYDROXYUREA 500 MG CAPSULE: ORAL | 30 days supply | Qty: 30 | Fill #4

## 2020-05-06 NOTE — Unmapped (Signed)
Lab orders entered for electronic submission to [facility].

## 2020-05-08 LAB — COMPREHENSIVE METABOLIC PANEL
A/G RATIO: 1.7 (ref 1.2–2.2)
ALBUMIN: 4.3 g/dL (ref 3.8–4.8)
ALKALINE PHOSPHATASE: 118 IU/L (ref 44–121)
ALT (SGPT): 13 IU/L (ref 0–32)
AST (SGOT): 23 IU/L (ref 0–40)
BILIRUBIN TOTAL: 0.3 mg/dL (ref 0.0–1.2)
BLOOD UREA NITROGEN: 16 mg/dL (ref 8–27)
BUN / CREAT RATIO: 15 (ref 12–28)
CALCIUM: 9.2 mg/dL (ref 8.7–10.3)
CHLORIDE: 105 mmol/L (ref 96–106)
CO2: 21 mmol/L (ref 20–29)
CREATININE: 1.04 mg/dL — ABNORMAL HIGH (ref 0.57–1.00)
EGFR: 59 mL/min/{1.73_m2} — ABNORMAL LOW
GLOBULIN, TOTAL: 2.5 g/dL (ref 1.5–4.5)
GLUCOSE: 109 mg/dL — ABNORMAL HIGH (ref 65–99)
POTASSIUM: 4.7 mmol/L (ref 3.5–5.2)
SODIUM: 142 mmol/L (ref 134–144)
TOTAL PROTEIN: 6.8 g/dL (ref 6.0–8.5)

## 2020-05-08 LAB — CBC W/ DIFFERENTIAL
BANDED NEUTROPHILS ABSOLUTE COUNT: 0 10*3/uL (ref 0.0–0.1)
BASOPHILS ABSOLUTE COUNT: 0 10*3/uL (ref 0.0–0.2)
BASOPHILS RELATIVE PERCENT: 1 %
EOSINOPHILS ABSOLUTE COUNT: 0.1 10*3/uL (ref 0.0–0.4)
EOSINOPHILS RELATIVE PERCENT: 1 %
HEMATOCRIT: 27.2 % — ABNORMAL LOW (ref 34.0–46.6)
HEMOGLOBIN: 9.8 g/dL — ABNORMAL LOW (ref 11.1–15.9)
IMMATURE GRANULOCYTES: 0 %
LYMPHOCYTES ABSOLUTE COUNT: 1.5 10*3/uL (ref 0.7–3.1)
LYMPHOCYTES RELATIVE PERCENT: 28 %
MEAN CORPUSCULAR HEMOGLOBIN CONC: 36 g/dL — ABNORMAL HIGH (ref 31.5–35.7)
MEAN CORPUSCULAR HEMOGLOBIN: 40 pg — ABNORMAL HIGH (ref 26.6–33.0)
MEAN CORPUSCULAR VOLUME: 111 fL — ABNORMAL HIGH (ref 79–97)
MONOCYTES ABSOLUTE COUNT: 0.7 10*3/uL (ref 0.1–0.9)
MONOCYTES RELATIVE PERCENT: 13 %
NEUTROPHILS ABSOLUTE COUNT: 3 10*3/uL (ref 1.4–7.0)
NEUTROPHILS RELATIVE PERCENT: 57 %
PLATELET COUNT: 437 10*3/uL (ref 150–450)
RED BLOOD CELL COUNT: 2.45 x10E6/uL — CL (ref 3.77–5.28)
RED CELL DISTRIBUTION WIDTH: 14 % (ref 11.7–15.4)
WHITE BLOOD CELL COUNT: 5.2 10*3/uL (ref 3.4–10.8)

## 2020-05-08 LAB — TSH: THYROID STIMULATING HORMONE: 5.28 u[IU]/mL — ABNORMAL HIGH (ref 0.450–4.500)

## 2020-05-08 LAB — LACTATE DEHYDROGENASE: LACTATE DEHYDROGENASE: 373 IU/L — ABNORMAL HIGH (ref 119–226)

## 2020-05-09 DIAGNOSIS — D473 Essential (hemorrhagic) thrombocythemia: Principal | ICD-10-CM

## 2020-05-09 NOTE — Unmapped (Signed)
I called Christy Buchanan about her labs.  She was unavailable so an Epic message was sent instead.     She is more anemic and her MCV has decreased.  This may be a sign of iron deficiency.  I have asked her to stop her Hydrea.  If she has already done so, she should reduce her ruxolitinib to 5 mg a day.     The treatment was designed to meet national guidelines for a platelet count < 450.  However, this goal may be too restrictive for some patients.      We will see her in 4 to 6 weeks.  Labs are in place.

## 2020-05-09 NOTE — Unmapped (Signed)
Completed.

## 2020-05-09 NOTE — Unmapped (Signed)
Spoke with [name] about scheduling a follow up with Sawchak for 4/27.

## 2020-05-23 NOTE — Unmapped (Signed)
[facility] Refill Coordination Note    Specialty Medication(s) to be Shipped:   Hematology/Oncology: [name]    Other medication(s) to be shipped: No additional medications requested for fill at this time     LIELA RYLEE, DOB: 06-17-1955  Phone: [phone] (home)       All above HIPAA information was verified with patient.     Was a Nurse, learning disability used for this call? No    Completed refill call assessment today to schedule patient's medication shipment from the [facility] [phone]).  All relevant notes have been reviewed.     Specialty medication(s) and dose(s) confirmed: Regimen is correct and unchanged.   Changes to medications: Christina reports no changes at this time.  Changes to insurance: No  New side effects reported not previously addressed with a pharmacist or physician: None reported  Questions for the pharmacist: No    Confirmed patient received a Conservation officer, historic buildings and a Surveyor, mining with first shipment. The patient will receive a drug information handout for each medication shipped and additional FDA Medication Guides as required.       DISEASE/MEDICATION-SPECIFIC INFORMATION        N/A    SPECIALTY MEDICATION ADHERENCE     Medication Adherence    Patient reported X missed doses in the last month: 0  Specialty Medication: Jakafi  Patient is on additional specialty medications: No  Additional Specialty Medications: Jakafi 5mg   Patient is on more than two specialty medications: No  Any gaps in refill history greater than 2 weeks in the last 3 months: no  Demonstrates understanding of importance of adherence: yes  Informant: patient              Were doses missed due to medication being on hold? No    Jakafi 5mg : Patient has 7 days of medication on hand     REFERRAL TO PHARMACIST     Referral to the pharmacist: Not needed      [facility]     Shipping address confirmed in Epic.     Delivery Scheduled: Yes, Expected medication delivery date: 4/19. Medication will be delivered via Next Day Courier to the prescription address in Epic WAM.    Olga Millers   [facility] Specialty Technician

## 2020-05-27 MED FILL — JAKAFI 5 MG TABLET: ORAL | 30 days supply | Qty: 90 | Fill #1

## 2020-06-05 NOTE — Unmapped (Signed)
Spoke with Christy Buchanan about scheduling a following clinic visit with [name] 5/4

## 2020-06-12 ENCOUNTER — Other Ambulatory Visit: Admit: 2020-06-12 | Discharge: 2020-06-13 | Payer: MEDICARE

## 2020-06-12 ENCOUNTER — Ambulatory Visit: Admit: 2020-06-12 | Discharge: 2020-06-13 | Payer: MEDICARE | Attending: Adult Health | Primary: Adult Health

## 2020-06-12 DIAGNOSIS — D473 Essential (hemorrhagic) thrombocythemia: Principal | ICD-10-CM

## 2020-06-12 LAB — COMPREHENSIVE METABOLIC PANEL
ALBUMIN: 3.7 g/dL (ref 3.4–5.0)
ALKALINE PHOSPHATASE: 106 U/L (ref 46–116)
ALT (SGPT): 21 U/L (ref 10–49)
ANION GAP: 5 mmol/L (ref 5–14)
AST (SGOT): 29 U/L (ref <=34)
BILIRUBIN TOTAL: 0.4 mg/dL (ref 0.3–1.2)
BLOOD UREA NITROGEN: 16 mg/dL (ref 9–23)
BUN / CREAT RATIO: 18
CALCIUM: 9.4 mg/dL (ref 8.7–10.4)
CHLORIDE: 108 mmol/L — ABNORMAL HIGH (ref 98–107)
CO2: 26 mmol/L (ref 20.0–31.0)
CREATININE: 0.89 mg/dL — ABNORMAL HIGH
EGFR CKD-EPI AA FEMALE: 78 mL/min/{1.73_m2} (ref >=60)
EGFR CKD-EPI NON-AA FEMALE: 68 mL/min/{1.73_m2} (ref >=60)
GLUCOSE RANDOM: 98 mg/dL (ref 70–179)
POTASSIUM: 3.8 mmol/L (ref 3.5–5.1)
PROTEIN TOTAL: 6.9 g/dL (ref 5.7–8.2)
SODIUM: 139 mmol/L (ref 135–145)

## 2020-06-12 LAB — CD34 ENUMERATION
CD34*: 0.05 %
CD45*: 100 %
PRE-APHERESIS 34: 3 {cells}/uL

## 2020-06-12 LAB — CBC W/ AUTO DIFF
BASOPHILS ABSOLUTE COUNT: 0 10*9/L (ref 0.0–0.1)
BASOPHILS RELATIVE PERCENT: 0.6 %
EOSINOPHILS ABSOLUTE COUNT: 0.1 10*9/L (ref 0.0–0.5)
EOSINOPHILS RELATIVE PERCENT: 1.2 %
HEMATOCRIT: 30 % — ABNORMAL LOW (ref 34.0–44.0)
HEMOGLOBIN: 10.4 g/dL — ABNORMAL LOW (ref 11.3–14.9)
LYMPHOCYTES ABSOLUTE COUNT: 1.5 10*9/L (ref 1.1–3.6)
LYMPHOCYTES RELATIVE PERCENT: 28.5 %
MEAN CORPUSCULAR HEMOGLOBIN CONC: 34.8 g/dL (ref 32.0–36.0)
MEAN CORPUSCULAR HEMOGLOBIN: 38.1 pg — ABNORMAL HIGH (ref 25.9–32.4)
MEAN CORPUSCULAR VOLUME: 109.5 fL — ABNORMAL HIGH (ref 77.6–95.7)
MEAN PLATELET VOLUME: 8 fL (ref 6.8–10.7)
MONOCYTES ABSOLUTE COUNT: 0.5 10*9/L (ref 0.3–0.8)
MONOCYTES RELATIVE PERCENT: 9.2 %
NEUTROPHILS ABSOLUTE COUNT: 3.2 10*9/L (ref 1.8–7.8)
NEUTROPHILS RELATIVE PERCENT: 60.5 %
PLATELET COUNT: 382 10*9/L (ref 150–450)
RED BLOOD CELL COUNT: 2.74 10*12/L — ABNORMAL LOW (ref 3.95–5.13)
RED CELL DISTRIBUTION WIDTH: 13.7 % (ref 12.2–15.2)
WBC ADJUSTED: 5.3 10*9/L (ref 3.6–11.2)

## 2020-06-12 LAB — IRON & TIBC
IRON SATURATION: 18 %
IRON: 58 ug/dL
TOTAL IRON BINDING CAPACITY: 327 ug/dL (ref 250–425)

## 2020-06-12 LAB — LACTATE DEHYDROGENASE: LACTATE DEHYDROGENASE: 342 U/L — ABNORMAL HIGH (ref 120–246)

## 2020-06-12 LAB — FERRITIN: FERRITIN: 34.6 ng/mL

## 2020-06-12 NOTE — Unmapped (Unsigned)
Error. months; check LDH, CD34, CBC, CMP at next visit  4) Recommend seeing a dermatologist  5) Continue to recommend graduated exercise.     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 CBC: 7.3/12.0/312  ?? LDH: WNL  ?? CD34: 0.02  ?? VWF: Normal   ?? MPN score: 48 to 49  ?? Began ruxolitinib 10 mg BID    04/05/19  ?? CBC: 7.3/12.0/312  ?? MPN score: 36 - 38  ?? Ruxolitinib 10 mg BID; Hydrea was stopped     05/17/19: Ruxolitinib 20 mg BID  ?? LDH: 1.30 x ULN  ?? MPN Score: 27    05/31/19: Ruxolitinib 15 mg BID (decrease due to anemia of 10.6)    07/12/19: Ruxolitinib 10 mg BID (decrease due to anemia of 10.4, plts 477); added Hydrea 500 mg a day   ?? CD34: 0.12  ?? LDH: 1.42 x ULN  ?? MPN Score: 32    10/25/19: Ruxolitinib 5 mg BID (continued anemia); Hydrea at 500 mg   ?? LDH: 1.29 x ULN  ?? MPN Score: 31    03/01/19:  Ruxolitinib 5 mg BID; Hydrea at 500 mg   ?? LDH: 1.39 x ULN  ?? MPN Score: 23    05/09/20: Hydrea stopped for anemia 9.  INTERVAL HX:  Christy Buchanan is doing well.  She notes improvement in her fatigue, which she attributes to meditation and mindfulness. She has found an Solicitor that has been quite effective.  She notes less anxiety.      She does note that her fatigue seems to have a cyclical nature.  She seems to go three weeks before having a crash. Her most recent crash happened in the past couple of days - hence the two columns in her MPN score.  Of note,the last week column is more indicative of how she feels. She is not sure what triggers the crashes.      We discussed a number of sx  ?? Sleep: This is going substantially better; she notes only rarely getting up without feeling refreshed.   ?? Stress: She is doing much better with her stress level  ?? Pruritus:  She feels itching secondary to her MPN is doing well.  She does note dry skin.   ?? Early Satiety:  This column generally gets a 5 though it is not due to splenomegaly.  She feels a fullness in her  LUQ.  I will continue to score it, but it does not reflect her MPN  ?? GI: She noted some diarrhea and loose stools of the past few days.  She denies any exposures.  She attributes this to a type of milk she rarely uses.   ?? ROS: The remainder of her ROS was negative.     MPN 10 Score  Symptom (Absent 0 to 10 worst imaginable) 2/21 2/21 4/21 6/21 9/21 ??last week ??1/22 ??   Fatigue in the past 24 hours 6 9 7 9  7  5 ??8 ??   Filling up quickly when you eat (Early satiety)  7 7 5 5 5  ??feels fat - ? 5 ??   Abdominal discomfort  3 4 4 4 4  ??3 5?? ??   Inactivity 5 5 - 6 5 7 5  ??4 4?? ??   Problems with concentration - 6 7-8 6 7 6  ??3 2?? ??   Numbness/ Tingling (in my hands and feet) 5 to 6 0 0 0 0 ??0 0?? ??   Night sweats 0 0 1 0 0 ??0 0?? ??   Itching (pruritus) 10 7 0 0 3 ??3 3?? ??   Bone pain (diffuse not joint pain or arthritis) 6 6 3  0 1 ??0 0?? ??   Fever (>100 F) 0 0 0 0 0 0?? 0?? ??   Unintentional weight loss last 6 months 0 0 0 0 0 ??0 0?? ??   TOTAL 48 - 49 36 - 38 27 32 31 23?? 27?? ??     PHYSICAL EXAM:  VS: As recorded above  GENERAL: Appears well  HEENT:Unremarkable  LYMPH NODES: No significant LAN  LUNGS: CTA  COR:RRR  ABD: NTND; no HSM  EXT:No edema  NEURO:Fluent conversation; coordinated movement  DERM: Mild xerosis; sites of possible pre-malignancies    PMHx  ??? 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 no malignancy; neg LN  o Txed with Surgery, radiation, auto BMT, raloxifene  o Negative WU  for 28 genes associated with hereditary cancers  o Raloxifene  ??? Sjogren's syndrome  ??? Autoimmune thyroiditis - Txed with methimazole   ??? GERD - Txed with PPI  ??? Mixed hyperlipidemia - Tx with Lovaza, pravastatin     FHx:  ?? Daughter w/ spina bifida, BRCA 1081G>A mutation (w/o cancer)      2016 WHO Diagnostic Criteria for MPNs  ET  Major Criteria  ??? Plts > 450  ??? BM biopsy showing proliferation mainly of the megakaryocyte lineage with increased numbers of enlarged, mature megakaryocytes with hyperlobulated nuclei. No significant left-shift of neutrophil granulopoiesis or erythropoiesis and very rarely minor (grade 1) increase in reticulin fibers  ??? Not meeting WHO criteria for BCR-ABL1?+?CML, PV, PMF, MDS, or other myeloid neoplasms)  ??? Presence of JAK2, CALR or MPL mutation    Minor Criteria  ??? Presence of a clonal marker (e.g., abnormal karyotype)  ??? Absence of evidence for reactive thrombocytosis    Diagnosis: All major criteria or first three major and one minor     PMF  Major criteria  ??? Proliferation and atypia of megakaryocytes accompanied by either reticulin and/or collagen fibrosis  grades 2 or 3 on a scale of 0 to 3  ??? Not meeting WHO criteria for ET, PV, BCR-ABL1+ CML, myelodysplastic syndromes, or other myeloid neoplasm  ??? Presence of JAK2, CALR or MPL mutation or in the absence of these mutations, presence of another clonal marker or absence of reactive myelofibrosis    Minor Criteria  ??? Anemia not attributed to another comorbid condition  ??? WBC > 11.0  ??? Palpable Splenomegaly   ??? LDH > ULN  ??? Leukoerythroblastosis     Diagnosis: 3 major and at least 1 minor confirmed in two consecutive determinations    IPSET Model for Prediction of Thrombosis    Age:    2   < 60: 0   > 60: 2  WBC count (x10e9)  0   <  11: 0   > 11: 1  Hx of Thrombosis  0   No: 0   Yes: 1  --------------------------------------------  Total:    2     10 yr thrombosis risk, median survival (yrs)  Lo Risk: 0 pts    --> 89%, NR  Int Risk: 1-2 pts --> 86%, 24.5  Hi Risk: 3-4 pts  --> 69%, 14.7    IPSET II  Age:    1   < 60: 0   > 60: 1  CVS Risk Factor*  0   No: 0   Yes: 1   Hx of Thrombosis  0   No: 0   Yes: 2  JAK2     2   No: 0   Yes: 2  --------------------------------------------  Total:    3    *Gave up smoking 10 years ago, near optimal non-LDL on treatment, non HTN    Annual Thrombosis Risk  Lo Risk: 0-1 pts  --> 1.03%  Int Risk: 2 pts     --> 2.35%  Hi Risk: 3-6 pts  --> 3.56%     NOTE  ?? JAK2 conveys a higher risk of thrombosis  ?? CALR conveys a higher risk of bleeding    Newer Model:  [email or web address]    Primary prophylaxis of thrombosis  All patients: Aspirin unless   ?????? VWF activity <30%,  ?????? Platelet >?1 million,  ?????? CALR-mutated low-risk ET    PV with Hct >?45%: Add phlebotomy/cytoreduction to target Hct <?45%  Age >?60 years and/or prior history of thrombosis: Add cytoreduction  Secondary prophylaxis after thrombotic event  ?All patients: Cytoreduction  ?Typical VTE: Consider indefinite VKA for most patients; aspirin if not on VKA  ?Atypical VTE: Indefinite VKA  ?Arterial thrombosis: Aspirin

## 2020-06-12 NOTE — Unmapped (Incomplete)
ID: Christy Buchanan is a 65 y.o. with a JAK2+ MPN    ASSESSMENT:   Christy Buchanan is a 65 y.o. 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  At the time of her diagnosis, she had no circulating immature or CD34+ cells, no increase in fibrosis, a normal LDH, and no palpable splenomegaly.      Christy. Buchanan is in for a visit earlier than she normally would be.  She reached out to our team reporting increased fatigue and lethargy.  We had labs done locally which showed some increased anemia.  At that point, decision was made to stop the Hydrea and continue the rux.  She is taking 5mg  qAM and 10mg  qPM.  She reports she feels much better on this combination.  There has been an increase in her MPN10 score, but she feels better than she did on the hydrea.      Her hgb has returned to what seems normal for her, the mid 10s.  Her platelets are well controlled.  We did review the NCCN guidelines of not needing to normalize platelets to have good disease control.  She understands.        PLAN:  1) Continue ruxolitinib 10 mg qAM and 5mg  PO qPM   2)  RTC in 6 months; check LDH, CD34, CBC, CMP at next visit  3) Follow up with her dermatologist  4. ASA 81mg  PO daily    Markus Jarvis, RN, MSN, AGPCNP-C  Nurse Practitioner  Hematologic Malignancies  [facility]  [phone] (phone)  [phone] (fax)  [email or web address]      I personally spent 50 minutes face-to-face and non-face-to-face in the care of this patient, which includes all pre, intra, and post visit time on the date of service.      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 CBC: 7.3/12.0/312  ?? LDH: WNL  ?? CD34: 0.02  ?? VWF: Normal   ?? MPN score: 48 to 49  ?? Began ruxolitinib 10 mg BID    04/05/19  ?? CBC: 7.3/12.0/312  ?? MPN score: 36 - 38  ?? Ruxolitinib 10 mg BID; Hydrea was stopped     05/17/19: Ruxolitinib 20 mg BID  ?? LDH: 1.30 x ULN  ?? MPN Score: 27    05/31/19: Ruxolitinib 15 mg BID (decrease due to anemia of 10.6)    07/12/19: Ruxolitinib 10 mg BID (decrease due to anemia of 10.4, plts 477); added Hydrea 500 mg a day   ?? CD34: 0.12  ?? LDH: 1.42 x ULN  ?? MPN Score: 32    10/25/19: Ruxolitinib 5 mg BID (continued anemia); Hydrea at 500 mg   ?? LDH: 1.29 x ULN  ?? MPN Score: 31    03/01/19:  Ruxolitinib 5 mg BID; Hydrea at 500 mg   ?? LDH: 1.39 x ULN  ?? MPN Score: 23    05/09/20: ??Ruxolitinib 5mg  qAM and 10mg  qPM;  Stopped hydrea    06/12/20: Ruxolitinib 5mg   qAM and 10mg  qPM;  ?? LDH 1.39 x ULN  ?? MPN score: 34 but reports feeling better than last visit even with higher score.      INTERVAL HX:  Doing well overall.  Since stopping the Hydrea, she feels better.  She takes Jakafi 5mg  qAM and10mg  qPM.  She is feeling well with that.        MPN 10 Score  Symptom (Absent 0 to 10 worst imaginable) 2/21 2/21 4/21 6/21 9/21 ??last week ??1/22 ??5/22   Fatigue in the past 24 hours 6 9 7 9 7 5  ??8 6??   Filling up quickly when you eat (Early satiety)  7 7 5 5 5  ??feels fat - ? 5 4??   Abdominal discomfort  3 4 4 4 4  ??3 5?? 4??   Inactivity 5 5 - 6 5 7 5  ??4 4?? 4??   Problems with concentration - 6 7-8 6 7 6  ??3 2?? 5??   Numbness/ Tingling (in my hands and feet) 5 to 6 0 0 0 0 ??0 0?? 0??   Night sweats 0 0 1 0 0 ??0 0?? 1   Itching (pruritus) 10 7 0 0 3 ??3 3?? ??7   Bone pain (diffuse not joint pain or arthritis) 6 6 3  0 1 ??0 0?? 3??   Fever (>100 F) 0 0 0 0 0 0?? 0?? 0??   Unintentional weight loss last 6 months 0 0 0 0 0 ??0 0?? 0??   TOTAL 48 - 49 36 - 38 27 32 31 23?? 27?? ??34     PHYSICAL EXAM:  VS: As recorded above  GENERAL: Appears well  HEENT:Unremarkable  LYMPH NODES: No significant LAN  LUNGS: CTA  COR:RRR  ABD: NTND; no HSM  EXT:No edema  NEURO:Fluent conversation; coordinated movement  DERM: Mild xerosis; sites of possible pre-malignancies    PMHx  ??? 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 no malignancy; neg LN  o Txed with Surgery, radiation, auto BMT, raloxifene  o Negative WU  for 28 genes associated with hereditary cancers  o Raloxifene  ??? Sjogren's syndrome  ??? Autoimmune thyroiditis - Txed with methimazole   ??? GERD - Txed with PPI  ??? Mixed hyperlipidemia - Tx with Lovaza, pravastatin     FHx:  ?? Daughter w/ spina bifida, BRCA 1081G>A mutation (w/o cancer)      2016 WHO Diagnostic Criteria for MPNs  ET  Major Criteria  ??? Plts > 450  ??? BM biopsy showing proliferation mainly of the megakaryocyte lineage with increased numbers of enlarged, mature megakaryocytes with hyperlobulated nuclei. No significant left-shift of neutrophil granulopoiesis or erythropoiesis and very rarely minor (grade 1) increase in reticulin fibers  ??? Not meeting WHO criteria for BCR-ABL1?+?CML, PV, PMF, MDS, or other myeloid neoplasms)  ??? Presence of JAK2, CALR or MPL mutation    Minor Criteria  ??? Presence of a clonal marker (e.g., abnormal karyotype)  ??? Absence of evidence for reactive thrombocytosis    Diagnosis: All major criteria or first three major and one minor     PMF  Major criteria  ??? Proliferation and atypia of megakaryocytes accompanied by either reticulin and/or collagen fibrosis  grades 2 or 3 on a scale of 0 to 3  ??? Not meeting WHO criteria for ET, PV, BCR-ABL1+ CML, myelodysplastic syndromes, or other myeloid neoplasm  ??? Presence of JAK2, CALR or MPL mutation or in  the absence of these mutations, presence of another clonal marker or absence of reactive myelofibrosis    Minor Criteria  ??? Anemia not attributed to another comorbid condition  ??? WBC > 11.0  ??? Palpable Splenomegaly   ??? LDH > ULN  ??? Leukoerythroblastosis Diagnosis: 3 major and at least 1 minor confirmed in two consecutive determinations    IPSET Model for Prediction of Thrombosis    Age:    2   < 60: 0   > 60: 2  WBC count (x10e9)  0   < 11: 0   > 11: 1  Hx of Thrombosis  0   No: 0   Yes: 1  --------------------------------------------  Total:    2     10 yr thrombosis risk, median survival (yrs)  Lo Risk: 0 pts    --> 89%, NR  Int Risk: 1-2 pts --> 86%, 24.5  Hi Risk: 3-4 pts  --> 69%, 14.7    IPSET II  Age:    1   < 60: 0   > 60: 1  CVS Risk Factor*  0   No: 0   Yes: 1   Hx of Thrombosis  0   No: 0   Yes: 2  JAK2     2   No: 0   Yes: 2  --------------------------------------------  Total:    3    *Gave up smoking 10 years ago, near optimal non-LDL on treatment, non HTN    Annual Thrombosis Risk  Lo Risk: 0-1 pts  --> 1.03%  Int Risk: 2 pts     --> 2.35%  Hi Risk: 3-6 pts  --> 3.56%     NOTE  ?? JAK2 conveys a higher risk of thrombosis  ?? CALR conveys a higher risk of bleeding    Newer Model:  [email or web address]    Primary prophylaxis of thrombosis  All patients: Aspirin unless   ?????? VWF activity <30%,  ?????? Platelet >?1 million,  ?????? CALR-mutated low-risk ET    PV with Hct >?45%: Add phlebotomy/cytoreduction to target Hct <?45%  Age >?60 years and/or prior history of thrombosis: Add cytoreduction  Secondary prophylaxis after thrombotic event  ?All patients: Cytoreduction  ?Typical VTE: Consider indefinite VKA for most patients; aspirin if not on VKA  ?Atypical VTE: Indefinite VKA  ?Arterial thrombosis: Aspirin another clonal marker or absence of reactive myelofibrosis    Minor Criteria  ??? Anemia not attributed to another comorbid condition  ??? WBC > 11.0  ??? Palpable Splenomegaly   ??? LDH > ULN  ??? Leukoerythroblastosis     Diagnosis: 3 major and at least 1 minor confirmed in two consecutive determinations    IPSET Model for Prediction of Thrombosis    Age:    2   < 60: 0   > 60: 2  WBC count (x10e9)  0   < 11: 0   > 11: 1  Hx of Thrombosis  0   No: 0   Yes: 1  --------------------------------------------  Total:    2     10 yr thrombosis risk, median survival (yrs)  Lo Risk: 0 pts    --> 89%, NR  Int Risk: 1-2 pts --> 86%, 24.5  Hi Risk: 3-4 pts  --> 69%, 14.7    IPSET II  Age:    1   < 60: 0   > 60: 1  CVS Risk Factor*  0   No: 0   Yes: 1   Hx  of Thrombosis  0   No: 0   Yes: 2  JAK2     2   No: 0   Yes: 2  --------------------------------------------  Total:    3    *Gave up smoking 10 years ago, near optimal non-LDL on treatment, non HTN    Annual Thrombosis Risk  Lo Risk: 0-1 pts  --> 1.03%  Int Risk: 2 pts     --> 2.35%  Hi Risk: 3-6 pts  --> 3.56%     NOTE  ?? JAK2 conveys a higher risk of thrombosis  ?? CALR conveys a higher risk of bleeding    Newer Model:  [email or web address]    Primary prophylaxis of thrombosis  All patients: Aspirin unless   ?????? VWF activity <30%,  ?????? Platelet >?1 million,  ?????? CALR-mutated low-risk ET    PV with Hct >?45%: Add phlebotomy/cytoreduction to target Hct <?45%  Age >?60 years and/or prior history of thrombosis: Add cytoreduction  Secondary prophylaxis after thrombotic event  ?All patients: Cytoreduction  ?Typical VTE: Consider indefinite VKA for most patients; aspirin if not on VKA  ?Atypical VTE: Indefinite VKA  ?Arterial thrombosis: Aspirin

## 2020-06-12 NOTE — Unmapped (Unsigned)
Peripheral stick done by TRobinson, 23g R H, labs drawn and sent.

## 2020-06-13 DIAGNOSIS — K219 Gastro-esophageal reflux disease without esophagitis: Principal | ICD-10-CM

## 2020-06-13 MED ORDER — OMEPRAZOLE 20 MG CAPSULE,DELAYED RELEASE
ORAL_CAPSULE | 0 refills | 0 days | Status: CP
Start: 2020-06-13 — End: ?

## 2020-06-17 NOTE — Unmapped (Signed)
[facility] Refill Coordination Note    Specialty Medication(s) to be Shipped:   Hematology/Oncology: [name]    Other medication(s) to be shipped: No additional medications requested for fill at this time     Christy Buchanan, DOB: [birth year]  Phone: [phone] (home)       All above HIPAA information was verified with patient.     Was a Nurse, learning disability used for this call? No    Completed refill call assessment today to schedule patient's medication shipment from the [facility] [phone]).  All relevant notes have been reviewed.     Specialty medication(s) and dose(s) confirmed: Regimen is correct and unchanged.   Changes to medications: Christy Buchanan reports stopping the following medications: paxil and started Celexa  Changes to insurance: No  New side effects reported not previously addressed with a pharmacist or physician: None reported  Questions for the pharmacist: No    Confirmed patient received a Conservation officer, historic buildings and a Surveyor, mining with first shipment. The patient will receive a drug information handout for each medication shipped and additional FDA Medication Guides as required.       DISEASE/MEDICATION-SPECIFIC INFORMATION        N/A    SPECIALTY MEDICATION ADHERENCE     Medication Adherence    Patient reported X missed doses in the last month: 0  Specialty Medication: Jakafi 5mg   Patient is on additional specialty medications: No  Informant: patient              Were doses missed due to medication being on hold? No    Jakafi 5 mg: 19 days of medicine on hand       REFERRAL TO PHARMACIST     Referral to the pharmacist: Not needed      [facility]     Shipping address confirmed in Epic.     Delivery Scheduled: Yes, Expected medication delivery date: 07/03/20.     Medication will be delivered via Next Day Courier to the prescription address in Epic [place].    Christy Buchanan   [facility] Specialty Technician

## 2020-07-03 MED FILL — JAKAFI 5 MG TABLET: ORAL | 30 days supply | Qty: 90 | Fill #2

## 2020-07-24 NOTE — Unmapped (Signed)
[facility] Refill Coordination Note    Specialty Medication(s) to be Shipped:   Hematology/Oncology: Jakafi 5 mg  Other medication(s) to be shipped: No additional medications requested for fill at this time     Christy Buchanan, DOB: 03/26/1955  Phone: [phone] (home)     All above HIPAA information was verified with patient.     Was a Nurse, learning disability used for this call? No    Completed refill call assessment today to schedule patient's medication shipment from the [facility] [phone]).  All relevant notes have been reviewed.     Specialty medication(s) and dose(s) confirmed: Regimen is correct and unchanged.   Changes to medications: Christy Buchanan no changes at this time.  Changes to insurance: No  New side effects reported not previously addressed with a pharmacist or physician: None reported  Questions for the pharmacist: No    Confirmed patient received a Conservation officer, historic buildings and a Surveyor, mining with first shipment. The patient will receive a drug information handout for each medication shipped and additional FDA Medication Guides as required.       DISEASE/MEDICATION-SPECIFIC INFORMATION        N/A    SPECIALTY MEDICATION ADHERENCE     Medication Adherence    Patient reported X missed doses in the last month: 0  Specialty Medication: Jakafi 5 mg  Patient is on additional specialty medications: No  Patient is on more than two specialty medications: No  Informant: patient  Reliability of informant: reliable  Reasons for non-adherence: no problems identified  Confirmed plan for next specialty medication refill: delivery by pharmacy  Refills needed for supportive medications: not needed        Were doses missed due to medication being on hold? No    Jakafi 5 mg: 10 days of medicine on hand     REFERRAL TO PHARMACIST     Referral to the pharmacist: Not needed    [facility]     Shipping address confirmed in Epic.     Delivery Scheduled: Yes, Expected medication delivery date: 07/31/2020.     Medication will be delivered via Same Day Courier to the prescription address in Epic [place].    Serina Nichter P Allena Katz   [facility] Specialty Technician

## 2020-07-31 DIAGNOSIS — D473 Essential (hemorrhagic) thrombocythemia: Principal | ICD-10-CM

## 2020-07-31 MED FILL — JAKAFI 5 MG TABLET: ORAL | 30 days supply | Qty: 90 | Fill #3

## 2020-08-01 NOTE — Unmapped (Signed)
[facility] Population Health   Gap Closure Progress Note             Date of Service:  08/01/20     Mode of contact: MyChart message  Outreach outcome: Spoke to pt    Health Maintenance Due:   Health Maintenance Due   Topic Date Due   ??? Zoster Vaccines (2 of 2) 03/12/2017   ??? Pneumococcal Vaccine 65+ (2 - PCV) 01/15/2018   ??? DEXA Scan-Start Age 66  Never done       Addressed the following gap(s) with [name]:  SDOH screening    With the following outcome(s): Completed; Concerns identified. CM offered to connect patient with resources; patient did not respond.     Additional Information/Plan:  Christy Buchanan was provided my direct contact information and was encouraged to contact me should additional needs arise.    Minutes spent providing outreach: 5    Benjaman Lobe, MSW, [facility] Specialist  [facility]  [phone]

## 2020-08-29 ENCOUNTER — Ambulatory Visit
Admit: 2020-08-29 | Discharge: 2020-08-30 | Payer: MEDICARE | Attending: Hematology & Oncology | Primary: Hematology & Oncology

## 2020-08-29 ENCOUNTER — Other Ambulatory Visit: Admit: 2020-08-29 | Discharge: 2020-08-30 | Payer: MEDICARE

## 2020-08-29 DIAGNOSIS — D473 Essential (hemorrhagic) thrombocythemia: Principal | ICD-10-CM

## 2020-08-29 DIAGNOSIS — D471 Chronic myeloproliferative disease: Principal | ICD-10-CM

## 2020-08-29 DIAGNOSIS — D4709 Other mast cell neoplasms of uncertain behavior: Principal | ICD-10-CM

## 2020-08-29 DIAGNOSIS — E039 Hypothyroidism, unspecified: Principal | ICD-10-CM

## 2020-08-29 NOTE — Unmapped (Addendum)
PLAN:  1) Continue the ruxolitnib - this is a good dose.   2) Consider increasing anti histamines  3) I will follow-up on the thyroid testing  4) You can ask about nicotinamide (B3) for skin cancer prevention. Its just a vitamin.   5) I can see in 6 months.   6) I will send an order in for Tryptase.     Zyrtec is fine. The D is for decongestant. You can take up to 4 x the indicated dose Zyrtec. You can try Zyrtec two times a day for a few weeks.     TSH was 5.28, which is higher than normal. This would suggest a need for synthroid. Asymptomatic increase in TSH, there is no intervention. If it is > 10, everyone agrees it should be addressed.     This is perfect -     All lab results last 24 hours:    Recent Results (from the past 24 hour(s))   Lactate dehydrogenase    Collection Time: 08/29/20  8:02 AM   Result Value Ref Range    LDH 376 (H) 120 - 246 U/L Changes in this number can be a sign of progression.  Your CD34 count is low. CD34 is a protein on immature cells. Yours is normal.    Comprehensive Metabolic Panel    Collection Time: 08/29/20  8:02 AM   Result Value Ref Range    Sodium 139 135 - 145 mmol/L    Potassium 4.2 3.4 - 4.8 mmol/L    Chloride 108 (H) 98 - 107 mmol/L    CO2 23.0 20.0 - 31.0 mmol/L    Anion Gap 8 5 - 14 mmol/L    BUN 14 9 - 23 mg/dL    Creatinine 1.61 (H) 0.60 - 0.80 mg/dL    BUN/Creatinine Ratio 15     eGFR CKD-EPI (2021) Female 70 >=60 mL/min/1.7m2    Glucose 95 70 - 179 mg/dL    Calcium 9.0 8.7 - 09.6 mg/dL    Albumin 3.6 3.4 - 5.0 g/dL    Total Protein 7.0 5.7 - 8.2 g/dL    Total Bilirubin 0.4 0.3 - 1.2 mg/dL    AST 29 <=04 U/L    ALT 17 10 - 49 U/L    Alkaline Phosphatase 87 46 - 116 U/L   CBC w/ Differential    Collection Time: 08/29/20  8:02 AM   Result Value Ref Range    WBC 4.8 3.6 - 11.2 10*9/L    RBC 3.34 (L) 3.95 - 5.13 10*12/L    HGB 11.0 (L) 11.3 - 14.9 g/dL This is better     HCT 32.1 (L) 34.0 - 44.0 %    MCV 96.2 (H) 77.6 - 95.7 fL    MCH 33.0 (H) 25.9 - 32.4 pg    MCHC 34.3 32.0 - 36.0 g/dL    RDW 54.0 98.1 - 19.1 %    MPV 7.9 6.8 - 10.7 fL    Platelet 431 150 - 450 10*9/L This is normal    Neutrophils % 51.0 %    Lymphocytes % 37.3 %    Monocytes % 9.8 %    Eosinophils % 1.3 %    Basophils % 0.6 %    Absolute Neutrophils 2.5 1.8 - 7.8 10*9/L    Absolute Lymphocytes 1.8 1.1 - 3.6 10*9/L    Absolute Monocytes 0.5 0.3 - 0.8 10*9/L    Absolute Eosinophils 0.1 0.0 - 0.5 10*9/L    Absolute Basophils 0.0  0.0 - 0.1 10*9/L

## 2020-08-30 NOTE — Unmapped (Signed)
ID: Christy Buchanan is a 65 y.o. with a JAK2+ MPN    DZ CHAR: ET  ?? JAK2+  ?? IPSET 2: High risk (age, JAK2)     ASSESSMENT:   Christy Buchanan is a 65 y.o. 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  At the time of her diagnosis, she had no circulating immature or CD34+ cells, no increase in fibrosis, a normal LDH, and no palpable splenomegaly.      Christy. Buchanan is doing reasonably well.  She has had some improvement in her energy level with lifestyle changes.  Her disease is well controlled on ruxolitnib.  She needs to have her lipids checked. We will do that at her next visit.     She was encouraged to talk with her dermatologist about nicotinamide (500 mg BID).  This drug is used to slow down the progression of non-melanomatous skin cancers.  This is specifically a problem with hydroxyurea though there are concerns for ruxolotinib as well.      Her T3, T4 are within normal limits.  Her TSH is < 10.  She is at risk for developing hypothyroidism at some point in the future.      We talked about the possible advantage of using higher dose antihistamines given her sx of allergic rhinitis.  We can check a tryptase to see if her mast cells are effected by her JAK2 mutation.     PLAN:  1) Continue the ruxolitnib.   2) Consider increasing anti histamines  3) Continue to follow TSH   4) I can see in 6 months.  5) Check lipid panel at that time.    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 CBC: 7.3/12.0/312  ?? LDH: WNL  ?? CD34: 0.02  ?? VWF: Normal   ?? MPN score: 48 to 49  ?? Began ruxolitinib 10 mg BID    04/05/19  ?? CBC: 7.3/12.0/312  ?? MPN score: 36 - 38  ?? Ruxolitinib 10 mg BID; Hydrea was stopped     05/17/19: Ruxolitinib 20 mg BID  ?? LDH: 1.30 x ULN  ?? MPN Score: 27    05/31/19: Ruxolitinib 15 mg BID (decrease due to anemia of 10.6)    07/12/19: Ruxolitinib 10 mg BID (decrease due to anemia of 10.4, plts 477); added Hydrea 500 mg a day   ?? CD34: 0.12  ?? LDH: 1.42 x ULN  ?? MPN Score: 32    10/25/19: Ruxolitinib 5 mg BID (continued anemia); Hydrea at 500 mg   ?? LDH: 1.29 x ULN  ?? MPN Score: 31    03/01/19:  Ruxolitinib 5 mg BID; Hydrea at 500 mg   ?? LDH: 1.39 x ULN  ?? MPN Score: 23    05/09/20: ??Ruxolitinib 5mg  qAM and 10mg  qPM;  Stopped hydrea    06/12/20: Ruxolitinib 5mg  qAM and 10mg  qPM;  ?? LDH 1.39 x ULN  ?? MPN score: 34 but  reports feeling better than last visit even with higher score.  08/29/20: 4.8/11.0/431    INTERVAL HX:  Doing well overall.  Since stopping the Hydrea, she feels better.  She takes Jakafi 5mg  qAM and10mg  qPM.  She is feeling well with that.  She notes sx c/w allergic rhinitis.      MPN 10 Score  Symptom (Absent 0 to 10 worst imaginable) 2/21 2/21 4/21 6/21 9/21 ??last week ??1/22 ??5/22   Fatigue in the past 24 hours 6 9 7 9 7 5  ??8 6??   Filling up quickly when you eat (Early satiety)  7 7 5 5 5  ??feels fat - ? 5 4??   Abdominal discomfort  3 4 4 4 4  ??3 5?? 4??   Inactivity 5 5 - 6 5 7 5  ??4 4?? 4??   Problems with concentration - 6 7-8 6 7 6  ??3 2?? 5??   Numbness/ Tingling (in my hands and feet) 5 to 6 0 0 0 0 ??0 0?? 0??   Night sweats 0 0 1 0 0 ??0 0?? 1   Itching (pruritus) 10 7 0 0 3 ??3 3?? ??7   Bone pain (diffuse not joint pain or arthritis) 6 6 3  0 1 ??0 0?? 3??   Fever (>100 F) 0 0 0 0 0 0?? 0?? 0??   Unintentional weight loss last 6 months 0 0 0 0 0 ??0 0?? 0??   TOTAL 48 - 49 36 - 38 27 32 31 23?? 27?? ??34     PHYSICAL EXAM:  VS: As recorded above  GENERAL: Appears well  HEENT:Unremarkable  LYMPH NODES: No significant LAN  LUNGS: CTA  COR:RRR  ABD: NTND; no HSM  EXT:No edema  NEURO:Fluent conversation; coordinated movement  DERM: Mild xerosis; sites of possible pre-malignancies    PMHx  ??? 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 Sjogren's syndrome  ??? Autoimmune thyroiditis - Txed with methimazole   ??? GERD - Txed with PPI  ??? Mixed hyperlipidemia - Tx with Lovaza, pravastatin     FHx:  ?? Daughter w/ spina bifida, BRCA 1081G>A mutation (w/o cancer)      2016 WHO Diagnostic Criteria for MPNs  ET  Major Criteria  ??? Plts > 450  ??? BM biopsy showing proliferation mainly of the megakaryocyte lineage with increased numbers of enlarged, mature megakaryocytes with hyperlobulated nuclei. No significant left-shift of neutrophil granulopoiesis or erythropoiesis and very rarely minor (grade 1) increase in reticulin fibers  ??? Not meeting WHO criteria for BCR-ABL1?+?CML, PV, PMF, MDS, or other myeloid neoplasms)  ??? Presence of JAK2, CALR or MPL mutation    Minor Criteria  ??? Presence of a clonal marker (e.g., abnormal karyotype)  ??? Absence of evidence for reactive thrombocytosis    Diagnosis: All major criteria or first three major and one minor     PMF  Major criteria  ??? Proliferation and atypia of megakaryocytes accompanied by either reticulin and/or collagen fibrosis  grades 2 or 3 on a scale of 0 to 3  ??? Not meeting WHO criteria for ET, PV, BCR-ABL1+ CML, myelodysplastic syndromes, or other myeloid neoplasm  ??? Presence of JAK2, CALR or MPL mutation or in the absence of these mutations, presence of another clonal marker  or absence of reactive myelofibrosis    Minor Criteria  ??? Anemia not attributed to another comorbid condition  ??? WBC > 11.0  ??? Palpable Splenomegaly   ??? LDH > ULN  ??? Leukoerythroblastosis     Diagnosis: 3 major and at least 1 minor confirmed in two consecutive determinations    IPSET Model for Prediction of Thrombosis    Age:    2   < 60: 0   > 60: 2  WBC count (x10e9)  0   < 11: 0   > 11: 1  Hx of Thrombosis  0   No: 0   Yes: 1  --------------------------------------------  Total:    2     10 yr thrombosis risk, median survival (yrs)  Lo Risk: 0 pts    --> 89%, NR  Int Risk: 1-2 pts --> 86%, 24.5  Hi Risk: 3-4 pts  --> 69%, 14.7    IPSET II  Age:    1   < 60: 0   > 60: 1  CVS Risk Factor*  0   No: 0   Yes: 1   Hx of Thrombosis  0   No: 0   Yes: 2  JAK2     2   No: 0   Yes: 2  --------------------------------------------  Total:    3    *Gave up smoking 10 years ago, near optimal non-LDL on treatment, non HTN    Annual Thrombosis Risk  Lo Risk: 0-1 pts  --> 1.03%  Int Risk: 2 pts     --> 2.35%  Hi Risk: 3-6 pts  --> 3.56%     NOTE  ?? JAK2 conveys a higher risk of thrombosis  ?? CALR conveys a higher risk of bleeding    Newer Model:  [email or web address]    Primary prophylaxis of thrombosis  All patients: Aspirin unless   ?????? VWF activity <30%,  ?????? Platelet >?1 million,  ?????? CALR-mutated low-risk ET    PV with Hct >?45%: Add phlebotomy/cytoreduction to target Hct <?45%  Age >?60 years and/or prior history of thrombosis: Add cytoreduction  Secondary prophylaxis after thrombotic event  ?All patients: Cytoreduction  ?Typical VTE: Consider indefinite VKA for most patients; aspirin if not on VKA  ?Atypical VTE: Indefinite VKA  ?Arterial thrombosis: Aspirin

## 2020-09-03 NOTE — Unmapped (Signed)
[facility] Clinical Assessment & Refill Coordination Note    Christy Buchanan, DOB: 1955-11-24  Phone: [phone] (home)     All above HIPAA information was verified with patient.     Was a Nurse, learning disability used for this call? No    Specialty Medication(s):   Hematology/Oncology: [id number]     Current Outpatient Medications   Medication Sig Dispense Refill   ??? aspirin (ECOTRIN) 81 MG tablet Take 81 mg by mouth.     ??? atorvastatin (LIPITOR) 40 MG tablet Take 1 tablet (40 mg total) by mouth daily. 30 tablet 11   ??? cetirizine (ZYRTEC) 10 MG tablet Take 10 mg by mouth every other day.      ??? docusate sodium (COLACE) 100 MG capsule Take 100 mg by mouth daily. PRN as needed     ??? levothyroxine (SYNTHROID) 25 MCG tablet Take 0.25 mcg by mouth daily.     ??? meTHIMazole (TAPAZOLE) 5 MG tablet Take 1/2 tablet every other day (Patient not taking: No sig reported) 30 tablet 1   ??? multivitamin (TAB-A-VITE/THERAGRAN) per tablet Take 1 tablet by mouth daily.      ??? omega-3 acid ethyl esters (LOVAZA) 1 gram capsule Take 1 g by mouth. (Patient not taking: No sig reported)     ??? omega-3 fatty acids-fish oil 300-1,000 mg cap capsule Take 1 capsule by mouth daily.      ??? omeprazole (PRILOSEC) 20 MG capsule TAKE 1 CAPSULE(20 MG) BY MOUTH DAILY 90 capsule 0   ??? PARoxetine (PAXIL) 20 MG tablet TAKE 1 TABLET BY MOUTH EVERY DAY 30 tablet 0   ??? polyethylene glycol (MIRALAX) 17 gram packet Take 17 g by mouth daily.      ??? raloxifene (EVISTA) 60 mg tablet Take 1 tablet (60 mg total) by mouth daily. 90 tablet 3   ??? ruxolitinib (JAKAFI) 5 mg tablet Take 1 tablet (5 mg total) by mouth every morning AND 2 tablets (10 mg total) every evening. 90 tablet 11     No current facility-administered medications for this visit.        Changes to medications: Montine reports no changes at this time.    No Known Allergies    Changes to allergies: No    SPECIALTY MEDICATION ADHERENCE     Jakafi 5 mg: 14 days of medicine on hand Medication Adherence    Patient reported X missed doses in the last month: 0  Specialty Medication: Jakafi 5 mg tablets -  5mg  AM and 10 mg PM  Patient is on additional specialty medications: No  Informant: patient  Confirmed plan for next specialty medication refill: delivery by pharmacy  Refills needed for supportive medications: not needed          Specialty medication(s) dose(s) confirmed: Regimen is correct and unchanged.     Are there any concerns with adherence? No    Adherence counseling provided? Not needed    CLINICAL MANAGEMENT AND INTERVENTION      Clinical Benefit Assessment:    Do you feel the medicine is effective or helping your condition? Yes    Clinical Benefit counseling provided? Not needed    Adverse Effects Assessment:    Are you experiencing any side effects? No    Are you experiencing difficulty administering your medicine? No    Quality of Life Assessment:    Quality of Life    Rheumatology  On a scale of 1 - 10 with 1 representing not at all  and 10 representing completely - how has your rheumatologic condition affected your:  Oncology  Dermatology          How many days over the past month did your Essential thrombocythemia  keep you from your normal activities? For example, brushing your teeth or getting up in the morning. 0    Have you discussed this with your provider? Not needed    Acute Infection Status:    Acute infections noted within Epic:  No active infections  Patient reported infection: None    Therapy Appropriateness:    Is therapy appropriate? Yes, therapy is appropriate and should be continued    DISEASE/MEDICATION-SPECIFIC INFORMATION      N/A    PATIENT SPECIFIC NEEDS     - Does the patient have any physical, cognitive, or cultural barriers? No    - Is the patient high risk? Yes, patient is taking oral chemotherapy. Appropriateness of therapy as been assessed    - Does the patient require a Care Management Plan? No     - Does the patient require physician intervention or other additional services (i.e. nutrition, smoking cessation, social work)? No      SHIPPING     Specialty Medication(s) to be Shipped:   Hematology/Oncology: Jakafi    Other medication(s) to be shipped: No additional medications requested for fill at this time     Changes to insurance: No    Delivery Scheduled: Yes, Expected medication delivery date: 09/12/20.     Medication will be delivered via Next Day Courier to the confirmed prescription address in [place].    The patient will receive a drug information handout for each medication shipped and additional FDA Medication Guides as required.  Verified that patient has previously received a Conservation officer, historic buildings and a Surveyor, mining.    The patient or caregiver noted above participated in the development of this care plan and knows that they can request review of or adjustments to the care plan at any time.      All of the patient's questions and concerns have been addressed.    Breck Coons [facility] Specialty Pharmacist

## 2020-09-11 MED FILL — JAKAFI 5 MG TABLET: ORAL | 30 days supply | Qty: 90 | Fill #4

## 2020-09-16 DIAGNOSIS — K219 Gastro-esophageal reflux disease without esophagitis: Principal | ICD-10-CM

## 2020-09-16 MED ORDER — OMEPRAZOLE 20 MG CAPSULE,DELAYED RELEASE
ORAL_CAPSULE | Freq: Every day | ORAL | 0 refills | 90.00000 days | Status: CP
Start: 2020-09-16 — End: ?

## 2020-10-04 NOTE — Unmapped (Signed)
[facility] Refill Coordination Note    Specialty Medication(s) to be Shipped:   Hematology/Oncology: Christy Buchanan    Other medication(s) to be shipped: No additional medications requested for fill at this time     Christy Buchanan, DOB: January 06, 1956  Phone: [phone] (home)       All above HIPAA information was verified with patient.     Was a Nurse, learning disability used for this call? No    Completed refill call assessment today to schedule patient's medication shipment from the [facility] [phone]).  All relevant notes have been reviewed.     Specialty medication(s) and dose(s) confirmed: Regimen is correct and unchanged.   Changes to medications: Christy Buchanan reports no changes at this time.  Changes to insurance: No  New side effects reported not previously addressed with a pharmacist or physician: None reported  Questions for the pharmacist: No    Confirmed patient received a Conservation officer, historic buildings and a Surveyor, mining with first shipment. The patient will receive a drug information handout for each medication shipped and additional FDA Medication Guides as required.       DISEASE/MEDICATION-SPECIFIC INFORMATION        N/A    SPECIALTY MEDICATION ADHERENCE     Medication Adherence    Patient reported X missed doses in the last month: 1  Specialty Medication: Jakafi 5 mg  Patient is on additional specialty medications: No        Were doses missed due to medication being on hold? No    Jakafi 5 mg: 11 days of medicine on hand     REFERRAL TO PHARMACIST     Referral to the pharmacist: Not needed      [facility]     Shipping address confirmed in Epic.     Delivery Scheduled: Yes, Expected medication delivery date: 10/10/2020.     Medication will be delivered via Next Day Courier to the prescription address in Epic [place].    Christy Buchanan   [facility] Specialty Technician

## 2020-10-09 MED FILL — JAKAFI 5 MG TABLET: ORAL | 30 days supply | Qty: 90 | Fill #5

## 2020-10-28 NOTE — Unmapped (Signed)
[facility] Refill Coordination Note    Specialty Medication(s) to be Shipped:   Hematology/Oncology: [name]    Other medication(s) to be shipped: No additional medications requested for fill at this time     CARROLYN HILMES, DOB: 03-Oct-1955  Phone: [phone] (home)       All above HIPAA information was verified with patient.     Was a Nurse, learning disability used for this call? No    Completed refill call assessment today to schedule patient's medication shipment from the [facility] [phone]).  All relevant notes have been reviewed.     Specialty medication(s) and dose(s) confirmed: Regimen is correct and unchanged.   Changes to medications: Ainhoa reports no changes at this time.  Changes to insurance: No  New side effects reported not previously addressed with a pharmacist or physician: None reported  Questions for the pharmacist: No    Confirmed patient received a Conservation officer, historic buildings and a Surveyor, mining with first shipment. The patient will receive a drug information handout for each medication shipped and additional FDA Medication Guides as required.       DISEASE/MEDICATION-SPECIFIC INFORMATION        N/A    SPECIALTY MEDICATION ADHERENCE     Medication Adherence    Patient reported X missed doses in the last month: 0  Specialty Medication: Jakafi 5 mg  Patient is on additional specialty medications: No  Informant: patient              Were doses missed due to medication being on hold? No    Jakafi 5 mg: 14 days of medicine on hand       REFERRAL TO PHARMACIST     Referral to the pharmacist: Not needed      [facility]     Shipping address confirmed in Epic.     Delivery Scheduled: Yes, Expected medication delivery date: 11/11/20.     Medication will be delivered via Same Day Courier to the prescription address in Epic [place].    [name]   [facility] Specialty Technician

## 2020-11-06 ENCOUNTER — Ambulatory Visit: Admit: 2020-11-06 | Discharge: 2020-11-07 | Payer: MEDICARE

## 2020-11-06 MED ORDER — NORTRIPTYLINE 10 MG CAPSULE
ORAL_CAPSULE | Freq: Every evening | ORAL | 2 refills | 60 days | Status: CP
Start: 2020-11-06 — End: 2021-11-06

## 2020-11-06 NOTE — Unmapped (Deleted)
c 

## 2020-11-06 NOTE — Unmapped (Signed)
Condition is stable  Continue with care per Hematology

## 2020-11-06 NOTE — Unmapped (Signed)
Condition is ongoing  Differential includes thyroid, sleep associated MDD  Medication and treatment plan as described in orders or med list.  Counseled on etiology, treatment, warning signs.  Patient education handout provided.

## 2020-11-06 NOTE — Unmapped (Signed)
Condition is stable  Medication and treatment plan as described in orders or med list.  Counseled on etiology, treatment, warning signs.  Patient education handout provided.

## 2020-11-07 NOTE — Unmapped (Signed)
ASSESSMENT/PLAN    Problem List Items Addressed This Visit        Other    Sjogrens syndrome (CMS-HCC) (Chronic)     Condition is stable.         Depression, major, single episode, complete remission (CMS-HCC) (Chronic)     Condition is stable  Medication and treatment plan as described in orders or med list.  Counseled on etiology, treatment, warning signs.  Patient education handout provided.               Relevant Medications    nortriptyline (PAMELOR) 10 MG capsule    MPN (myeloproliferative neoplasm) (CMS-HCC) (Chronic)     Condition is stable  Continue with care per Hematology               Sleep disorder    Relevant Medications    nortriptyline (PAMELOR) 10 MG capsule    Other fatigue     Condition is ongoing  Differential includes thyroid, sleep associated MDD  Medication and treatment plan as described in orders or med list.  Counseled on etiology, treatment, warning signs.  Patient education handout provided.               Need for influenza vaccination    Relevant Orders    INFLUENZA QUAD ADJUVANTED 42YR UP(FLUAD) (Completed)          I personally spent 30 minutes face-to-face and non-face-to-face in the care of this patient, which includes all pre, intra, and post visit time on the date of service.    Patient education handout provided.      Follow-up visits include:   Future Appointments   Date Time Provider Department Center   02/27/2021  9:30 AM ADULT [facility]C [facility]   02/27/2021 10:40 AM Halford Decamp, MD [id number]              SUBJECTIVE    Subjective   This is a 65 y.o. female who presents with had concerns including Fatigue.     Fatigue  Patient complains of fatigue. Symptoms began several months ago. The patient feels the fatigue began with: nothing specific. Symptoms of her fatigue have been general malaise. Patient describes the following psychological symptoms: none. Patient denies GI blood loss, significant change in weight and witnessed or suspected sleep apnea. Symptoms have progressed to a point and plateaued. Symptom severity: symptoms bothersome, but easily able to carry out all usual work/school/family activities.     REVIEW OF SYSTEMS    Past medical history, medications, social history, and allergies reviewed and updated.  Remainder of 10 of 13 systems review negative except follows with psychiatrist for MDD         Objective     OBJECTIVE  BP 140/80 (BP Site: R Arm, BP Position: Sitting, BP Cuff Size: Medium)  - Pulse 64  - Temp 35.9 ??C (96.7 ??F) (Temporal)  - Wt 58.9 kg (129 lb 12.8 oz)  - BMI 22.28 kg/m??    Physical Examination  Vital Signs- reviewed  General appearance - alert, well appearing, and in no distress  Mental status - alert, oriented to person, place, and time  Eyes - pupils equal and reactive, extraocular eye movements intact  Ears - bilateral TM's and external ear canals normal  Neck - supple, no significant adenopathy  Chest - clear to auscultation, no wheezes, rales or rhonchi, symmetric air entry  Heart - normal rate, regular rhythm without murmurs, rubs, clicks or  gallops  Abdomen - soft, nontender, nondistended, no masses or organomegaly  Neurological - alert, oriented, normal speech, no focal findings or movement disorder noted  Extremities - no pedal edema, no clubbing or cyanosis  Skin - normal coloration and turgor, no rashes, no suspicious skin lesions noted      Labs, Imaging, and Other Clinical Data:  I have reviewed the labs, imaging studies, and other clinical data associated with this encounter.  See Epic Labs and Imaging section for details.

## 2020-11-07 NOTE — Unmapped (Signed)
Condition is stable

## 2020-11-11 MED FILL — JAKAFI 5 MG TABLET: ORAL | 30 days supply | Qty: 90 | Fill #6

## 2020-11-25 DIAGNOSIS — D471 Chronic myeloproliferative disease: Principal | ICD-10-CM

## 2020-11-25 DIAGNOSIS — M3501 Sicca syndrome with keratoconjunctivitis: Principal | ICD-10-CM

## 2020-11-27 DIAGNOSIS — E039 Hypothyroidism, unspecified: Principal | ICD-10-CM

## 2020-11-27 DIAGNOSIS — R5383 Other fatigue: Principal | ICD-10-CM

## 2020-11-28 NOTE — Unmapped (Signed)
[facility] Refill Coordination Note    Specialty Medication(s) to be Shipped:   Hematology/Oncology: Christy Buchanan    Other medication(s) to be shipped: No additional medications requested for fill at this time     Christy Buchanan, DOB: [birth year]  Phone: [phone] (home)       All above HIPAA information was verified with patient.     Was a Nurse, learning disability used for this call? No    Completed refill call assessment today to schedule patient's medication shipment from the [facility] [phone]).  All relevant notes have been reviewed.     Specialty medication(s) and dose(s) confirmed: Regimen is correct and unchanged.   Changes to medications: Increased Citalopram to 30mg   Changes to insurance: No  New side effects reported not previously addressed with a pharmacist or physician: None reported  Questions for the pharmacist: No    Confirmed patient received a Conservation officer, historic buildings and a Surveyor, mining with first shipment. The patient will receive a drug information handout for each medication shipped and additional FDA Medication Guides as required.       DISEASE/MEDICATION-SPECIFIC INFORMATION        N/A    SPECIALTY MEDICATION ADHERENCE     Medication Adherence    Patient reported X missed doses in the last month: 0  Specialty Medication: Jakafi 5 mg  Patient is on additional specialty medications: No  Informant: patient              Were doses missed due to medication being on hold? No    Jakafi 5 mg: 14 days of medicine on hand       REFERRAL TO PHARMACIST     Referral to the pharmacist: Not needed      [facility]     Shipping address confirmed in Epic.     Delivery Scheduled: Yes, Expected medication delivery date: 12/12/20.     Medication will be delivered via Next Day Courier to the prescription address in Epic [place].    Christy Buchanan M Christy Buchanan   [facility] Specialty Technician

## 2020-12-05 DIAGNOSIS — Z1231 Encounter for screening mammogram for malignant neoplasm of breast: Principal | ICD-10-CM

## 2020-12-11 MED FILL — JAKAFI 5 MG TABLET: ORAL | 30 days supply | Qty: 90 | Fill #7

## 2020-12-12 DIAGNOSIS — K219 Gastro-esophageal reflux disease without esophagitis: Principal | ICD-10-CM

## 2020-12-12 MED ORDER — OMEPRAZOLE 20 MG CAPSULE,DELAYED RELEASE
ORAL_CAPSULE | Freq: Every day | ORAL | 0 refills | 90 days | Status: CP
Start: 2020-12-12 — End: ?

## 2020-12-31 NOTE — Unmapped (Signed)
[facility] Refill Coordination Note    Specialty Medication(s) to be Shipped:   Hematology/Oncology: [name] 5mg     Other medication(s) to be shipped: No additional medications requested for fill at this time     JEYDI KLINGEL, DOB: 1955/07/06  Phone: [phone] (home)       All above HIPAA information was verified with patient.     Was a Nurse, learning disability used for this call? No    Completed refill call assessment today to schedule patient's medication shipment from the [facility] [phone]).  All relevant notes have been reviewed.     Specialty medication(s) and dose(s) confirmed: Regimen is correct and unchanged.   Changes to medications: Trenisha reports no changes at this time.  Changes to insurance: No  New side effects reported not previously addressed with a pharmacist or physician: None reported  Questions for the pharmacist: No    Confirmed patient received a Conservation officer, historic buildings and a Surveyor, mining with first shipment. The patient will receive a drug information handout for each medication shipped and additional FDA Medication Guides as required.       DISEASE/MEDICATION-SPECIFIC INFORMATION        N/A    SPECIALTY MEDICATION ADHERENCE     Medication Adherence    Patient reported X missed doses in the last month: 0  Specialty Medication: Jakafi 5 mg  Patient is on additional specialty medications: No  Informant: patient              Were doses missed due to medication being on hold? No    Jakafi 5 mg: 14 days of medicine on hand       REFERRAL TO PHARMACIST     Referral to the pharmacist: Not needed      [facility]     Shipping address confirmed in Epic.     Delivery Scheduled: Yes, Expected medication delivery date: 01/10/21.     Medication will be delivered via Next Day Courier to the prescription address in Epic [place].    [name]   [facility] Specialty Technician

## 2021-01-09 MED FILL — JAKAFI 5 MG TABLET: ORAL | 30 days supply | Qty: 90 | Fill #8

## 2021-01-21 ENCOUNTER — Ambulatory Visit: Admit: 2021-01-21 | Discharge: 2021-01-21 | Payer: MEDICARE

## 2021-01-21 LAB — T3: T3 TOTAL: 115.5 ng/dL (ref 60.0–180.0)

## 2021-01-21 LAB — THYROID PEROXIDASE ANTIBODY: THYROID PEROXIDASE ANTIBODIES: 48 U/mL (ref <=60)

## 2021-01-21 LAB — T4, FREE: FREE T4: 0.98 ng/dL (ref 0.89–1.76)

## 2021-01-21 LAB — TSH: THYROID STIMULATING HORMONE: 7.668 u[IU]/mL — ABNORMAL HIGH (ref 0.550–4.780)

## 2021-01-21 NOTE — Unmapped (Signed)
Reason for Consult: Evaluate hyperthyroidism.    Referring Provider: Timoteo Ace Dev*    Primary Care Provider: Cathlean Marseilles, DO      Assessment/Plan:     1. 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   - she has been on low dose methimazole 2.5 mg every day since then.   - recently has been having worsening fatigue and TFTs in mild hypothyroid ranges  Plan:  - we dicussed possibility of Graves disease being in remission and not needing to take methimazole. We will re-check TFTs today and assess for Graves antibodies. If Thyroid levels still mildly low and negative graves antibodies, will stop methimazole and recheck thyroid function in 6 weeks to ensure thyroid levels go back to normal.   - TSH  - T4, Free  - T3  - Thyroid Stimulating Immunoglobulin  - Thyrotropin Receptor Antibody  - Thyroid Peroxidase Antibody      2. Hypothyroidism due to medication  - see above    All questions were answered and patient agrees with plan.   Follow-up in 3 mo    Thank you for referring your patient to our endocrine clinic for evaluation. Please do not hesitate to contact me with any questions.     Tawanna Sat, MD  [facility]  Phone [phone]  Fax [phone]    Subjective:       Christy Buchanan is a 65 y.o. 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 History was obtained from the patient and review of the electronic medical record.       Patient has long standing history of hyperthyroidism due to Graves disease last seen at [facility] on 12/2017 by Dr. Tedd Sias, she was on low dose methimazole 2.5 mg every day.      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     The patient reports   Anterior neck pain: no  Anterior neck swelling: no  Recent URI: no  Dysphagia: no  Hoarseness: no  Tremors: no  Palpitations: no  Weight loss: no  Change in bowel habits: no  Energy: fatigue   Insomnia: no  Change in vision or diplopia or eye discomfort: no    Periods: post-menopause    She denies taking lithium, amiodarone, biotin supplements or thyroid supplements.      FH: mother she had thyroid cancer s/p hemithyroidectomy.      Past Medical History:   Diagnosis Date   ??? Autoimmune thyroiditis    ??? Breast cancer (CMS-HCC)    ??? GERD (gastroesophageal reflux disease)    ??? Hyperlipidemia    ??? Osteoporosis    ??? Sjogren's disease (CMS-HCC)    ??? Thrombocythemia    ??? Vitamin D deficiency        No Known Allergies  Family History   Problem Relation Age of Onset   ??? Cancer Mother    ??? Heart disease Mother    ??? Cancer Father      Social History     Socioeconomic History   ??? Marital status: Divorced  Spouse name: None   ??? Number of children: None   ??? Years of education: None   ??? Highest education level: None   Tobacco Use   ??? Smoking status: Former     Types: Cigarettes   ??? Smokeless tobacco: Never   Vaping Use   ??? Vaping Use: Never used   Substance and Sexual Activity   ??? Alcohol use: Yes     Comment: rare     Social Determinants of Health     Financial Resource Strain: Low Risk    ??? Difficulty of Paying Living Expenses: Not very hard   Food Insecurity: Food Insecurity Present   ??? Worried About Programme researcher, broadcasting/film/video in the Last Year: Sometimes true   ??? Ran Out of Food in the Last Year: Never true   Transportation Needs: No Transportation Needs   ??? Lack of Transportation (Medical): No   ??? Lack of Transportation (Non-Medical): No      Past Surgical History:   Procedure Laterality Date   ??? AUGMENTATION MAMMAPLASTY Bilateral     24ish yrs   ??? BREAST BIOPSY Left    ??? CHEMOTHERAPY     ??? MASTECTOMY Left 25 ish yrs ago        Current Outpatient Medications:   ???  aspirin (ECOTRIN) 81 MG tablet, Take 81 mg by mouth., Disp: , Rfl:   ???  atorvastatin (LIPITOR) 40 MG tablet, Take 1 tablet (40 mg total) by mouth daily., Disp: 30 tablet, Rfl: 11  ???  cetirizine (ZYRTEC) 10 MG tablet, Take 10 mg by mouth every other day. , Disp: , Rfl:   ???  citalopram (CELEXA) 20 MG tablet, Take 20 mg by mouth daily., Disp: , Rfl:   ???  docusate sodium (COLACE) 100 MG capsule, Take 100 mg by mouth daily. PRN as needed, Disp: , Rfl:   ???  meTHIMazole (TAPAZOLE) 5 MG tablet, Take 1/2 tablet every other day, Disp: 30 tablet, Rfl: 1  ???  multivitamin (TAB-A-VITE/THERAGRAN) per tablet, Take 1 tablet by mouth daily. , Disp: , Rfl:   ???  nortriptyline (PAMELOR) 10 MG capsule, Take 1 capsule (10 mg total) by mouth nightly., Disp: 60 capsule, Rfl: 2  ???  omega-3 acid ethyl esters (LOVAZA) 1 gram capsule, Take 1 g by mouth., Disp: , Rfl:   ???  omega-3 fatty acids-fish oil 300-1,000 mg cap capsule, Take 1 capsule by mouth daily. , Disp: , Rfl:   ???  omeprazole (PRILOSEC) 20 MG capsule, Take 1 capsule (20 mg total) by mouth daily. TAKE 1 CAPSULE(20 MG) BY MOUTH DAILY, Disp: 90 capsule, Rfl: 0  ???  polyethylene glycol (MIRALAX) 17 gram packet, Take 17 g by mouth daily. , Disp: , Rfl:   ???  raloxifene (EVISTA) 60 mg tablet, Take 1 tablet (60 mg total) by mouth daily., Disp: 90 tablet, Rfl: 3  ???  ruxolitinib (JAKAFI) 5 mg tablet, Take 1 tablet (5 mg total) by mouth every morning AND 2 tablets (10 mg total) every evening., Disp: 90 tablet, Rfl: 11      Review of Systems  A 12 point review of systems was otherwise negative except as noted in the HPI.      Objective:      BP 132/73  - Pulse 80  - Ht 162.6 cm (5' 4.02)  - Wt 58.8 kg (129 lb 9.6 oz)  - BMI 22.23 kg/m??     Wt Readings from Last 3 Encounters:   01/21/21 58.8 kg (  129 lb 9.6 oz)   11/06/20 58.9 kg (129 lb 12.8 oz)   08/29/20 58.4 kg (128 lb 12.8 oz)      BMI Readings from Last 3 Encounters: 01/21/21 22.23 kg/m??   11/06/20 22.28 kg/m??   08/29/20 22.11 kg/m??      GEN: appears well  HEENT: EOMI, sclerae anicteric, no proptosis, lid lag, periorbital edema  NECK: supple, thyroid normal size, no palpable nodules, no LAD  CV: rrr, no murmur  CHEST: clear to auscultation bilaterally  ABD: soft, nontender, +BS  EXT: no edema, no obvious deformity  NEURO: no tremor, AOx3, following commands  PSYCH: normal affect.    Lab Reviewed:    TSH (uIU/mL)   Date Value   08/29/2020 4.872 (H)   05/07/2020 5.280 (H)   03/15/2019 2.785   04/20/2018 1.790   02/22/2018 0.406 (L)     T3, Free (pg/mL)   Date Value   08/29/2020 2.82     Free T4 (ng/dL)   Date Value   84/13/2440 0.97   03/15/2019 0.85   04/20/2018 0.73   02/22/2018 0.86     Labs from 2015 TSH < 0.02,  2016 at [facility] showed positive Tg Ab and TPO ab     Radiology reviewed:  ([facility]) Neck ultrasound in 01/2015 showed heterogenous appearing thyroid without nodules.

## 2021-01-22 NOTE — Unmapped (Signed)
It was a pleasure seeing you today at our [facility] clinic.   If any testing was ordered today, most of the time the results will appear in your MyChart account even before I see them. Once all your results are available, I will contact you within 1-2 weeks with an explanation. Please contact me if you do not hear from me by then.   Please make sure you have access to MyChart since this is how I will communicate with you.  If you do not have MyChart, I will send you a letter which may take 1-2 weeks to arrive.      Remember to call us with any urgent issues. Please allow a few days for mychart messages to be read and answered.     If you are seen for diabetes, please bring your glucometer and all devices to every visit.   All refill requests must be submitted 14 days in advance to allow enough time to be processed.     Labs today. If Thyroid levels still mildly low and negative graves antibodies, will stop methimazole and recheck thyroid function in 6 weeks to ensure thyroid levels go back to normal.

## 2021-01-23 LAB — THYROTROPIN RECEPTOR ANTIBODY: THYROTROPIN RECEPTOR ANTIBODY: <1.1 IU/L

## 2021-01-23 NOTE — Unmapped (Signed)
Addended by: Dulcy Fanny, Jonetta Speak E on: 01/23/2021 11:42 AM     Modules accepted: Orders

## 2021-01-27 ENCOUNTER — Ambulatory Visit: Admit: 2021-01-27 | Discharge: 2021-01-28 | Payer: MEDICARE

## 2021-01-27 DIAGNOSIS — L820 Inflamed seborrheic keratosis: Principal | ICD-10-CM

## 2021-01-27 DIAGNOSIS — L821 Other seborrheic keratosis: Principal | ICD-10-CM

## 2021-01-27 DIAGNOSIS — L814 Other melanin hyperpigmentation: Principal | ICD-10-CM

## 2021-01-27 DIAGNOSIS — D1801 Hemangioma of skin and subcutaneous tissue: Principal | ICD-10-CM

## 2021-01-27 DIAGNOSIS — D471 Chronic myeloproliferative disease: Principal | ICD-10-CM

## 2021-01-27 NOTE — Unmapped (Signed)
Dermatology Note     Assessment and Plan:      Irritated/Inflamed Seborrheic Keratosis (LOC on the right thigh)  - Discussed benign nature of lesions, but due to symptoms, offered treatment with cryotherapy.  - Patient agrees to cryotherapy. See procedure note for details.  Cryotherapy Procedure Note:   After R/B/A discussed (including scarring, pigment alteration, recurrence, or persistence of the lesion) and verbal consent was obtained, identified lesions were treated with liquid nitrogen x 2 ten second freeze-thaw cycle. The patient tolerated the procedure well and was instructed on post-procedure care.  Total ISK(s) frozen: 1  Location and number: right anterior thigh    Benign Lesions/ Findings:   Angioma(s)  Lentigo/Lentigines  Seborrheic Keratosis(es) - no irritation noted  - Reassurance provided regarding the benign appearance of lesions noted on exam today; no treatment is indicated in the absence of symptoms/changes.  - Reinforced importance of photoprotective strategies including liberal and frequent sunscreen use of a broad-spectrum SPF 30 or greater, use of protective clothing, and sun avoidance for prevention of cutaneous malignancy and photoaging.  Counseled patient on the importance of regular self-skin monitoring as well as routine clinical skin examinations as scheduled.     Myeloproliferative neoplasm  - follows Heme Onc. On ruxolitinib  - discussed limited role of niacinimide for SCC prevention in her case, but she can try 500mg  BID if desired and tolerating well.      The patient was advised to call for an appointment should any new, changing, or symptomatic lesions develop.     RTC: Return in about 1 year (around 01/27/2022) for TBSE. or sooner as needed   _________________________________________________________________      Chief Complaint     Chief Complaint   Patient presents with   ??? Skin Check     fbse       HPI     Christy Buchanan is a 65 y.o. female who presents as a patient who is seen in consultation by Arn Medal, MD at the request of Timoteo Ace Dev* to [facility] for a full body skin exam. Patient has MPN on ruxolitinib. She has a lesion of concern on the right thigh which is raised and itchy and catches on clothing. Not previously treated.    The patient denies any other new or changing lesions or areas of concern.     Pertinent Past Medical History     No history of skin cancer    GERD  Hypothyroidism  Thrombocytopenia  HLD  Sjogrens  Myeloproliferative neoplasm  H/o breast cancer    Problem List        Other    MPN (myeloproliferative neoplasm) (CMS-HCC) (Chronic)     Family History:   Negative for melanoma    Past Medical History, Family History, Social History, Medication List, Allergies, and Problem List were reviewed in the rooming section of Epic.     ROS: Other than symptoms mentioned in the HPI, no fevers, chills, or other skin complaints    Physical Examination     GENERAL: Well-appearing female in no acute distress, resting comfortably.  NEURO: Alert and oriented, answers questions appropriately  SKIN (Full Skin Exam): Examination of the face, eyelids, lips, nose, ears, neck, chest, abdomen, back, arms, legs, hands, feet, palms, soles, nails was performed, including scalp  - Angioma(s): Scattered red vascular papule(s) on the scattered diffusely  - Lentigo/lentigines: Scattered pigmented macules that are tan to brown in color and are somewhat non-uniform in shape  and concentrated in the sun-exposed areas of the face and upper extremities  - Seborrheic Keratosis(es): Stuck-on appearing keratotic papule(s) on the scattered diffusely, one irritated with redness, crusting, edema, and/or partial avulsion (LOC on the right thigh)    All areas not commented on are within normal limits or unremarkable      (Approved Template 10/23/2019)

## 2021-01-27 NOTE — Unmapped (Signed)
It was nice to see you today! Your resident physician was Dr. Marvis Moeller    Studies have shown that niacinamide (nicotinamide) / B3 can decrease the number of new skin cancers made by about a quarter in people who have a tendency to make a lot of skin cancers.    Take 500mg  twice a daily of niacinamide (nicotinamide).  This can be purchased for about $5-9 per bottle on [employer], [employer], and various super stores.  Do not purchase niacin.  This can cause flushing.        CRYOSURGERY  Cryosurgery (???freezing???) uses liquid nitrogen to destroy certain types of skin lesions. Lowering the temperature of the lesion in a small area surrounding skin destroys the lesion. Immediately following cryosurgery, you will notice redness and swelling of the treatment area. Blistering or weeping may occur, lasting approximately one week which will then be followed by crusting. Most areas will heal completely in 10 to 14 days.     Wash the treated areas daily. Allow soap and water to run over the areas, but do not scrub. Should a scab or crust form, allow it to fall off on its own. Do not remove or pick at it. Application of an ointment and a bandage may make you feel more comfortable, but it is not necessary. Some people develop an allergy to Neosporin, so we recommend that Vaseline or Aquaphor be used.     The cryotherapy site will be more sensitive than your surrounding skin. Keep it covered, and remember to apply sunscreen every day to all your sun exposed skin. A scar may remain which is lighter or pinker than your normal skin. Your body will continue to improve your scar for up to one year; however, a light-colored scar may remain.     Infection following cryotherapy is rare. However, if you are worried about the appearance of the treated area, contact your doctor. We have a physician on call at all times. If you have any concerns about the site, please call our clinic at [phone]             If any of your medications are too expensive, look for a coupon at [name] or reference the application on a smartphone  - Enter the medication name, size, and your zip code to find coupons for local pharmacies.   - Print a coupon and bring it to the pharmacy, or pull up the coupon on a smartphone.  - You can also call pharmacies to ask about the cost of your medication before you pick it up.  If you still cannot afford your medication, please let us know.     Please call our clinic at [phone] with any concerns. We look forward to seeing you again!    [facility] releases most results to you as soon as they are available. Therefore, you may see some results before we do. Please give [place] 2 business days to review the tests and contact you by phone or through MyChart. If you are concerned that some results may be upsetting or confusing, you may wish to wait until we contact you before looking at the report in MyChart.  If you have an urgent question, you can send [place] a message or call our clinic. Otherwise, we prefer that you wait 2 business days for [place] to contact you.

## 2021-01-29 LAB — THYROID STIMULATING IMMUNOGLOBULIN: THYROID STIMULATING IMMUNOGLOB: <1 {TSI_index}

## 2021-01-30 NOTE — Unmapped (Signed)
[facility] Clinical Assessment & Refill Coordination Note    Christy Buchanan, DOB: Feb 20, 1955  Phone: [phone] (home)     All above HIPAA information was verified with patient.     Was a Nurse, learning disability used for this call? No    Specialty Medication(s):   Hematology/Oncology: GNFAOZ     Current Outpatient Medications   Medication Sig Dispense Refill   ??? aspirin (ECOTRIN) 81 MG tablet Take 81 mg by mouth.     ??? atorvastatin (LIPITOR) 40 MG tablet Take 1 tablet (40 mg total) by mouth daily. 30 tablet 11   ??? cetirizine (ZYRTEC) 10 MG tablet Take 10 mg by mouth every other day.      ??? citalopram (CELEXA) 20 MG tablet Take 20 mg by mouth daily.     ??? docusate sodium (COLACE) 100 MG capsule Take 100 mg by mouth daily. PRN as needed     ??? meTHIMazole (TAPAZOLE) 5 MG tablet Take 1/2 tablet every other day 30 tablet 1   ??? multivitamin (TAB-A-VITE/THERAGRAN) per tablet Take 1 tablet by mouth daily.      ??? nortriptyline (PAMELOR) 10 MG capsule Take 1 capsule (10 mg total) by mouth nightly. 60 capsule 2   ??? omega-3 acid ethyl esters (LOVAZA) 1 gram capsule Take 1 g by mouth.     ??? omega-3 fatty acids-fish oil 300-1,000 mg cap capsule Take 1 capsule by mouth daily.      ??? omeprazole (PRILOSEC) 20 MG capsule Take 1 capsule (20 mg total) by mouth daily. TAKE 1 CAPSULE(20 MG) BY MOUTH DAILY 90 capsule 0   ??? polyethylene glycol (MIRALAX) 17 gram packet Take 17 g by mouth daily.      ??? raloxifene (EVISTA) 60 mg tablet Take 1 tablet (60 mg total) by mouth daily. 90 tablet 3   ??? ruxolitinib (JAKAFI) 5 mg tablet Take 1 tablet (5 mg total) by mouth every morning AND 2 tablets (10 mg total) every evening. 90 tablet 11     No current facility-administered medications for this visit.        Changes to medications: Christy Buchanan reports no changes at this time.    No Known Allergies    Changes to allergies: No    SPECIALTY MEDICATION ADHERENCE     Jakafi 5 mg: about 10 days of medicine on hand     Medication Adherence    Patient reported X missed doses in the last month: 0  Specialty Medication: Jakafi 5 mg tabs - 5mg  AM and 10 mg PM  Patient is on additional specialty medications: No  Informant: patient  Confirmed plan for next specialty medication refill: delivery by pharmacy  Refills needed for supportive medications: not needed          Specialty medication(s) dose(s) confirmed: Regimen is correct and unchanged.     Are there any concerns with adherence? No    Adherence counseling provided? Not needed    CLINICAL MANAGEMENT AND INTERVENTION      Clinical Benefit Assessment:    Do you feel the medicine is effective or helping your condition? Yes    Clinical Benefit counseling provided? Not needed    Adverse Effects Assessment:    Are you experiencing any side effects? No    Are you experiencing difficulty administering your medicine? No    Quality of Life Assessment:    Quality of Life    Rheumatology  Oncology  2. On a scale of 1-10, how would  you rate your ability to manage side effects associated with your specialty medication? (1=no issues, 10 = unable to take medication due to side effects): 1  Dermatology  Cystic Fibrosis          How many days over the past month did your Essential thrombocythemia  keep you from your normal activities? For example, brushing your teeth or getting up in the morning. 0    Have you discussed this with your provider? Not needed    Acute Infection Status:    Acute infections noted within Epic:  No active infections  Patient reported infection: None    Therapy Appropriateness:    Is therapy appropriate and patient progressing towards therapeutic goals? Yes, therapy is appropriate and should be continued    DISEASE/MEDICATION-SPECIFIC INFORMATION      N/A    PATIENT SPECIFIC NEEDS     - Does the patient have any physical, cognitive, or cultural barriers? No    - Is the patient high risk? No    - Does the patient require a Care Management Plan? No     SOCIAL DETERMINANTS OF HEALTH At the [facility], we have learned that life circumstances - like trouble affording food, housing, utilities, or transportation can affect the health of many of our patients.   That is why we wanted to ask: are you currently experiencing any life circumstances that are negatively impacting your health and/or quality of life? Patient declined to answer    Social Determinants of Health     Food Insecurity: Food Insecurity Present   ??? Worried About Programme researcher, broadcasting/film/video in the Last Year: Sometimes true   ??? Ran Out of Food in the Last Year: Never true   Tobacco Use: Medium Risk   ??? Smoking Tobacco Use: Former   ??? Smokeless Tobacco Use: Never   ??? Passive Exposure: Not on file   Transportation Needs: No Transportation Needs   ??? Lack of Transportation (Medical): No   ??? Lack of Transportation (Non-Medical): No   Alcohol Use: Not on file   Housing/Utilities: Low Risk    ??? Within the past 12 months, have you ever stayed: outside, in a car, in a tent, in an overnight shelter, or temporarily in someone else's home (i.e. couch-surfing)?: No   ??? Are you worried about losing your housing?: No   ??? Within the past 12 months, have you been unable to get utilities (heat, electricity) when it was really needed?: No   Substance Use: Not on file   Financial Resource Strain: Low Risk    ??? Difficulty of Paying Living Expenses: Not very hard   Physical Activity: Not on file   Health Literacy: Not on file   Stress: Not on file   Intimate Partner Violence: Not on file   Depression: Not on file   Social Connections: Not on file       Would you be willing to receive help with any of the needs that you have identified today? Not applicable       SHIPPING     Specialty Medication(s) to be Shipped:   Hematology/Oncology: Christy Buchanan    Other medication(s) to be shipped: No additional medications requested for fill at this time     Changes to insurance: No    Delivery Scheduled: Yes, Expected medication delivery date: 02/07/21.     Medication will be delivered via Next Day Courier to the confirmed prescription address in [place].    The patient  will receive a drug information handout for each medication shipped and additional FDA Medication Guides as required.  Verified that patient has previously received a Conservation officer, historic buildings and a Surveyor, mining.    The patient or caregiver noted above participated in the development of this care plan and knows that they can request review of or adjustments to the care plan at any time.      All of the patient's questions and concerns have been addressed.    Breck Coons [facility] Specialty Pharmacist

## 2021-02-05 DIAGNOSIS — D473 Essential (hemorrhagic) thrombocythemia: Principal | ICD-10-CM

## 2021-02-06 NOTE — Unmapped (Signed)
Christy Buchanan 's Jakafi shipment will be sent out  as a result of copay is now approved by patient/caregiver.      I have reached out to the patient  at [phone] and communicated the delivery change. We will reschedule the medication for the delivery date that the patient agreed upon.  We have confirmed the delivery date as 02/07/2021, via same day courier.

## 2021-02-06 NOTE — Unmapped (Signed)
[facility] 's Jakafi shipment will be delayed as a result of a high copay.     I have reached out to the patient  at [phone] and left a voicemail message.  We will wait for a call back from the patient to reschedule the delivery.  We have not confirmed the new delivery date.      Patient's grant funds have been exhausted, and she will not qualify for renewal. MAPs is working on [facility] assistance option. Need her to approve $363.06 copay before sending out.

## 2021-02-07 MED FILL — JAKAFI 5 MG TABLET: ORAL | 30 days supply | Qty: 90 | Fill #9

## 2021-02-26 NOTE — Unmapped (Signed)
[facility] Refill Coordination Note    Specialty Medication(s) to be Shipped:   Hematology/Oncology: Christy Buchanan    Other medication(s) to be shipped: No additional medications requested for fill at this time     Christy Buchanan, DOB: September 20, 1955  Phone: [phone] (home)       All above HIPAA information was verified with patient.     Was a Nurse, learning disability used for this call? No    Completed refill call assessment today to schedule patient's medication shipment from the [facility] [phone]).  All relevant notes have been reviewed.     Specialty medication(s) and dose(s) confirmed: Regimen is correct and unchanged.   Changes to medications: [name] reports stopping the following medications: Methimazole  Changes to insurance: No  New side effects reported not previously addressed with a pharmacist or physician: None reported  Questions for the pharmacist: No    Confirmed patient received a Conservation officer, historic buildings and a Surveyor, mining with first shipment. The patient will receive a drug information handout for each medication shipped and additional FDA Medication Guides as required.       DISEASE/MEDICATION-SPECIFIC INFORMATION        N/A    SPECIALTY MEDICATION ADHERENCE     Medication Adherence    Patient reported X missed doses in the last month: 0  Specialty Medication: Jakafi 5 mg  Patient is on additional specialty medications: No  Informant: patient              Were doses missed due to medication being on hold? No    Jakafi  5 mg: 14 days of medicine on hand       REFERRAL TO PHARMACIST     Referral to the pharmacist: Not needed      [facility]     Shipping address confirmed in Epic.     Delivery Scheduled: Yes, Expected medication delivery date: 03/12/21.     Medication will be delivered via Next Day Courier to the prescription address in Epic [place].    [name]   [facility] Specialty Technician

## 2021-02-27 ENCOUNTER — Other Ambulatory Visit: Admit: 2021-02-27 | Discharge: 2021-02-28 | Payer: MEDICARE

## 2021-02-27 ENCOUNTER — Ambulatory Visit
Admit: 2021-02-27 | Discharge: 2021-02-28 | Payer: MEDICARE | Attending: Hematology & Oncology | Primary: Hematology & Oncology

## 2021-02-27 DIAGNOSIS — D4709 Other mast cell neoplasms of uncertain behavior: Principal | ICD-10-CM

## 2021-02-27 DIAGNOSIS — D473 Essential (hemorrhagic) thrombocythemia: Principal | ICD-10-CM

## 2021-02-27 DIAGNOSIS — E0500 Thyrotoxicosis with diffuse goiter without thyrotoxic crisis or storm: Principal | ICD-10-CM

## 2021-02-27 DIAGNOSIS — E032 Hypothyroidism due to medicaments and other exogenous substances: Principal | ICD-10-CM

## 2021-02-27 DIAGNOSIS — E785 Hyperlipidemia, unspecified: Principal | ICD-10-CM

## 2021-02-27 LAB — CBC W/ AUTO DIFF
BASOPHILS ABSOLUTE COUNT: 0 10*9/L (ref 0.0–0.1)
BASOPHILS RELATIVE PERCENT: 0.7 %
EOSINOPHILS ABSOLUTE COUNT: 0 10*9/L (ref 0.0–0.5)
EOSINOPHILS RELATIVE PERCENT: 0.7 %
HEMATOCRIT: 33.4 % — ABNORMAL LOW (ref 34.0–44.0)
HEMOGLOBIN: 11.4 g/dL (ref 11.3–14.9)
LYMPHOCYTES ABSOLUTE COUNT: 1.7 10*9/L (ref 1.1–3.6)
LYMPHOCYTES RELATIVE PERCENT: 26.9 %
MEAN CORPUSCULAR HEMOGLOBIN CONC: 34.3 g/dL (ref 32.0–36.0)
MEAN CORPUSCULAR HEMOGLOBIN: 31.2 pg (ref 25.9–32.4)
MEAN CORPUSCULAR VOLUME: 91.1 fL (ref 77.6–95.7)
MEAN PLATELET VOLUME: 7.8 fL (ref 6.8–10.7)
MONOCYTES ABSOLUTE COUNT: 0.5 10*9/L (ref 0.3–0.8)
MONOCYTES RELATIVE PERCENT: 8.2 %
NEUTROPHILS ABSOLUTE COUNT: 3.9 10*9/L (ref 1.8–7.8)
NEUTROPHILS RELATIVE PERCENT: 63.5 %
PLATELET COUNT: 477 10*9/L — ABNORMAL HIGH (ref 150–450)
RED BLOOD CELL COUNT: 3.66 10*12/L — ABNORMAL LOW (ref 3.95–5.13)
RED CELL DISTRIBUTION WIDTH: 14.8 % (ref 12.2–15.2)
WBC ADJUSTED: 6.2 10*9/L (ref 3.6–11.2)

## 2021-02-27 LAB — COMPREHENSIVE METABOLIC PANEL
ALBUMIN: 3.5 g/dL (ref 3.4–5.0)
ALKALINE PHOSPHATASE: 78 U/L (ref 46–116)
ALT (SGPT): 18 U/L (ref 10–49)
ANION GAP: 8 mmol/L (ref 5–14)
AST (SGOT): 25 U/L (ref <=34)
BILIRUBIN TOTAL: 0.4 mg/dL (ref 0.3–1.2)
BLOOD UREA NITROGEN: 15 mg/dL (ref 9–23)
BUN / CREAT RATIO: 18
CALCIUM: 9 mg/dL (ref 8.7–10.4)
CHLORIDE: 109 mmol/L — ABNORMAL HIGH (ref 98–107)
CO2: 26 mmol/L (ref 20.0–31.0)
CREATININE: 0.83 mg/dL — ABNORMAL HIGH
EGFR CKD-EPI (2021) FEMALE: 78 mL/min/{1.73_m2} (ref >=60)
GLUCOSE RANDOM: 97 mg/dL (ref 70–179)
POTASSIUM: 3.9 mmol/L (ref 3.4–4.8)
PROTEIN TOTAL: 6.7 g/dL (ref 5.7–8.2)
SODIUM: 143 mmol/L (ref 135–145)

## 2021-02-27 LAB — CHOLESTEROL, TOTAL: CHOLESTEROL: 159 mg/dL (ref <=200)

## 2021-02-27 LAB — LACTATE DEHYDROGENASE: LACTATE DEHYDROGENASE: 289 U/L — ABNORMAL HIGH (ref 120–246)

## 2021-02-27 LAB — T4, FREE: FREE T4: 0.86 ng/dL — ABNORMAL LOW (ref 0.89–1.76)

## 2021-02-27 LAB — TSH: THYROID STIMULATING HORMONE: 3.748 u[IU]/mL (ref 0.550–4.780)

## 2021-02-27 MED ORDER — RUXOLITINIB 10 MG TABLET
ORAL_TABLET | Freq: Two times a day (BID) | ORAL | 11 refills | 30 days | Status: CP
Start: 2021-02-27 — End: ?
  Filled 2021-03-12: qty 60, 30d supply, fill #0

## 2021-02-27 NOTE — Unmapped (Signed)
Patient stated that she have been noticing some off balancing for the past month or more. She have been noticing different episodes when she have to catch herself. She stated that this is new to her. She also said she have tinnitus now in her ears now for the past month or more.

## 2021-02-27 NOTE — Unmapped (Signed)
Ruxolitinib: Some feel that 10 mg twice a day is the minimum dose  I would take 10 mg twice day.     I do check cholesterol b/c it is a side of ruxolitinib.  These calculators give you an idea of risk and benefit of therapy.  I will send you the results by MyChart.     For patients with IBS, some benefit may occur from reducing gas-producing foods. These include beans, onions, celery, carrots, raisins, bananas, apricots, prunes, Brussels sprouts, wheat germ, pretzels, and bagels. Reduction in consumption of these foods may reduce gas formation, bloating, and flatulence.     For exercise, if it takes 24 hours to recover, you have done too much. You want to be able to exercise daily.   On good days, do less; on bad days, do more.     All lab results last 24 hours:    Recent Results (from the past 24 hour(s))   Cholesterol, Total    Collection Time: 02/27/21  9:33 AM   Result Value Ref Range    Cholesterol 159 <=200 mg/dL   Comprehensive Metabolic Panel    Collection Time: 02/27/21  9:33 AM   Result Value Ref Range    Sodium 143 135 - 145 mmol/L    Potassium 3.9 3.4 - 4.8 mmol/L    Chloride 109 (H) 98 - 107 mmol/L    CO2 26.0 20.0 - 31.0 mmol/L    Anion Gap 8 5 - 14 mmol/L    BUN 15 9 - 23 mg/dL    Creatinine 1.61 (H) 0.60 - 0.80 mg/dL    BUN/Creatinine Ratio 18     eGFR CKD-EPI (2021) Female 78 >=60 mL/min/1.47m2    Glucose 97 70 - 179 mg/dL    Calcium 9.0 8.7 - 09.6 mg/dL    Albumin 3.5 3.4 - 5.0 g/dL    Total Protein 6.7 5.7 - 8.2 g/dL    Total Bilirubin 0.4 0.3 - 1.2 mg/dL    AST 25 <=04 U/L    ALT 18 10 - 49 U/L    Alkaline Phosphatase 78 46 - 116 U/L   Lactate dehydrogenase    Collection Time: 02/27/21  9:33 AM   Result Value Ref Range    LDH 289 (H) 120 - 246 U/L   TSH    Collection Time: 02/27/21  9:33 AM   Result Value Ref Range    TSH 3.748 0.550 - 4.780 uIU/mL   T4, Free    Collection Time: 02/27/21  9:33 AM   Result Value Ref Range    Free T4 0.86 (L) 0.89 - 1.76 ng/dL   CBC w/ Differential    Collection Time: 02/27/21  9:33 AM   Result Value Ref Range    WBC 6.2 3.6 - 11.2 10*9/L    RBC 3.66 (L) 3.95 - 5.13 10*12/L    HGB 11.4 11.3 - 14.9 g/dL    HCT 54.0 (L) 98.1 - 44.0 %    MCV 91.1 77.6 - 95.7 fL    MCH 31.2 25.9 - 32.4 pg    MCHC 34.3 32.0 - 36.0 g/dL    RDW 19.1 47.8 - 29.5 %    MPV 7.8 6.8 - 10.7 fL    Platelet 477 (H) 150 - 450 10*9/L    Neutrophils % 63.5 %    Lymphocytes % 26.9 %    Monocytes % 8.2 %    Eosinophils % 0.7 %    Basophils % 0.7 %  Absolute Neutrophils 3.9 1.8 - 7.8 10*9/L    Absolute Lymphocytes 1.7 1.1 - 3.6 10*9/L    Absolute Monocytes 0.5 0.3 - 0.8 10*9/L    Absolute Eosinophils 0.0 0.0 - 0.5 10*9/L    Absolute Basophils 0.0 0.0 - 0.1 10*9/L

## 2021-03-02 NOTE — Unmapped (Unsigned)
ID: Christy Buchanan is a 66 y.o. with a JAK2+ MPN    DZ CHAR: ET (JAK2+)  ?? BM Bx (2018):  ??? Increased atypical megakaryocytes with clustering  ??? 2% blasts  ??? JAK2 mutation (VAF 3.5%)  ??? Nl karyotype   ??? Cellularity from 40 - 50%  ??? Patchy mild increase in reticulin; no increase in collagen; no fibrosis  ?? No monoclonal B or T cell populations  ?? IPSET 2: High risk (age, JAK2)   ?? Tx:   ?? Hydrea (DC'd due to inc MPN Sx  ?? Ruxolitnib    ASSESSMENT:   Christy Buchanan is a 66 y.o. 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  At the time of her diagnosis, she had no circulating immature or CD34+ cells, no increase in fibrosis, a normal LDH, and no palpable splenomegaly.      She was transitioned from hydroxyurea to ruxolitinib due to increased MPN related sx.  This change reduced her MPN symptom score from 48 to 21.  Unfortunately, she continues to have fatigue, which is likely multi-factorial.  Correction of her thyroid should help as well as the change in her anti-depressant.  We also talked about graduated exercise. Unfortunately, a lot of her fatigue is related to psychosocial stress, which is difficult to treat with a pill.     We talked about other lifestyle issues as well (see her AVS)     She is tolerating her ruxolitinib well. Her platelets are in an acceptable range; her LDH has improved; and her cholesterol is not elevated.   I would increase her ruxolitinib to 10 mg BID since this is likely above the therapeutic threshold.      PLAN:  1) Increase ruxolitnib to 10 mg BID.   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 CBC: 7.3/12.0/312  ?? LDH: WNL  ?? CD34: 0.02  ?? VWF: Normal   ?? MPN score: 48 to 49  ?? Began ruxolitinib 10 mg BID    04/05/19  ?? CBC: 7.3/12.0/312  ?? MPN score: 36 - 38  ?? Ruxolitinib 10 mg BID; Hydrea was stopped     05/17/19: Ruxolitinib 20 mg BID  ?? LDH: 1.30 x ULN  ?? MPN Score: 27    05/31/19: Ruxolitinib 15 mg BID (decrease due to anemia of 10.6)    07/12/19: Ruxolitinib 10 mg BID (decrease due to anemia of 10.4, plts 477); added Hydrea 500 mg a day   ?? CD34: 0.12  ?? LDH: 1.42 x ULN  ?? MPN Score: 32    10/25/19: Ruxolitinib 5 mg BID (continued anemia); Hydrea at 500 mg   ?? LDH: 1.29 x ULN  ?? MPN Score: 31    03/01/19:  Ruxolitinib 5 mg BID; Hydrea at 500 mg   ?? LDH: 1.39 x ULN  ?? MPN Score: 23    05/09/20: ??Ruxolitinib 5mg  qAM and 10mg   qPM;  Stopped hydrea    06/12/20: Ruxolitinib 5mg  qAM and 10mg  qPM;  ?? LDH 1.39 x ULN  ?? MPN score: 34 but reports feeling better than last visit even with higher score.    08/29/20: 4.8/11.0/431  02/27/21: 6.2/11.4/477; Ruxolitinib 10 mg BID  ?? MPN: 21  ?? Xol: 159  ?? LDH: 1.17 x ULN    INTERVAL HX:  Christy Buchanan comes for FU of her MPN. We discussed the following:  ?? Tinnitus: She notes worsening tinnitus. She has been evaluated; her hearing is equal bilaterally; she did tell our staff of some balance issues  ?? Thyroid: She has gotten off methimazole  ?? Psych: She notes doing better from anxiety and concentration on Celexa  ?? The remainder of her ROS was negative.     MPN 10 Score  Symptom (Absent 0 to 10 worst imaginable) 2/21 2/21 4/21 6/21 9/21 ??1/22 ??5/22 02/20/21   Fatigue in the past 24 hours 6 9 7 9 7  ??8 6?? 5   Filling up quickly when you eat (Early satiety)  7 7 5 5 5 5  4?? 0   Abdominal discomfort  3 4 4 4 4  5?? 4?? 2 - She reports this mostly as gas; her appetite has decreased somewhat   Inactivity 5 5 - 6 5 7 5  4?? 4?? 4   Problems with concentration - 6 7-8 6 7 6  2?? 5?? 3   Numbness/ Tingling (in my hands and feet) 5 to 6 0 0 0 0 0?? 0?? 0   Night sweats 0 0 1 0 0 0?? 1 0   Itching (pruritus) 10 7 0 0 3 3?? ??7 7   Bone pain (diffuse not joint pain or arthritis) 6 6 3  0 1 0?? 3?? 0   Fever (>100 F) 0 0 0 0 0 0?? 0?? 0   Unintentional weight loss last 6 months 0 0 0 0 0 0?? 0?? 0   TOTAL 48 - 49 36 - 38 27 32 31 27?? ??34 21     PHYSICAL EXAM:  VS: As recorded in Epic  GENERAL:  She appears well,  HEENT: C/S clear; NP is patent  LYMPH NODES: No LAN  LUNGS: CTA  COR: RRR; no m/r/g  ABD: NTND; no HSM  EXT:No edema  NEURO: Speech is fluent; gait is WNL    PMHx  ??? 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 Sjogren's syndrome  ??? Autoimmune thyroiditis - Txed with methimazole   ??? GERD - Txed with PPI  ??? Mixed hyperlipidemia - Tx with Lovaza, pravastatin     FHx:  ?? Daughter w/ spina bifida, BRCA 1081G>A mutation (w/o cancer)      2016 WHO Diagnostic Criteria for MPNs  ET  Major Criteria  ??? Plts > 450  ??? BM biopsy showing proliferation mainly of the megakaryocyte lineage with increased numbers of enlarged, mature megakaryocytes with hyperlobulated nuclei. No significant left-shift of neutrophil granulopoiesis or erythropoiesis and very rarely minor (grade 1) increase in reticulin fibers  ??? Not meeting WHO criteria for BCR-ABL1?+?CML, PV, PMF, MDS, or other myeloid neoplasms)  ??? Presence of JAK2, CALR or MPL mutation    Minor Criteria  ??? Presence of a clonal marker (e.g., abnormal  karyotype)  ??? Absence of evidence for reactive thrombocytosis    Diagnosis: All major criteria or first three major and one minor     PMF  Major criteria  ??? Proliferation and atypia of megakaryocytes accompanied by either reticulin and/or collagen fibrosis  grades 2 or 3 on a scale of 0 to 3  ??? Not meeting WHO criteria for ET, PV, BCR-ABL1+ CML, myelodysplastic syndromes, or other myeloid neoplasm  ??? Presence of JAK2, CALR or MPL mutation or in the absence of these mutations, presence of another clonal marker or absence of reactive myelofibrosis    Minor Criteria  ??? Anemia not attributed to another comorbid condition  ??? WBC > 11.0  ??? Palpable Splenomegaly   ??? LDH > ULN  ??? Leukoerythroblastosis     Diagnosis: 3 major and at least 1 minor confirmed in two consecutive determinations    IPSET Model for Prediction of Thrombosis    Age:    2   < 60: 0   > 60: 2  WBC count (x10e9)  0   < 11: 0   > 11: 1  Hx of Thrombosis  0   No: 0   Yes: 1  --------------------------------------------  Total:    2     10 yr thrombosis risk, median survival (yrs)  Lo Risk: 0 pts    --> 89%, NR  Int Risk: 1-2 pts --> 86%, 24.5  Hi Risk: 3-4 pts  --> 69%, 14.7    IPSET II  Age:    1   < 60: 0   > 60: 1  CVS Risk Factor*  0   No: 0   Yes: 1   Hx of Thrombosis  0   No: 0   Yes: 2  JAK2     2   No: 0   Yes: 2  --------------------------------------------  Total:    3    *Gave up smoking 10 years ago, near optimal non-LDL on treatment, non HTN    Annual Thrombosis Risk  Lo Risk: 0-1 pts  --> 1.03%  Int Risk: 2 pts     --> 2.35%  Hi Risk: 3-6 pts  --> 3.56%     NOTE  ?? JAK2 conveys a higher risk of thrombosis  ?? CALR conveys a higher risk of bleeding    Newer Model:  [email or web address]    Primary prophylaxis of thrombosis  All patients: Aspirin unless   ?????? VWF activity <30%,  ?????? Platelet >?1 million,  ?????? CALR-mutated low-risk ET    PV with Hct >?45%: Add phlebotomy/cytoreduction to target Hct <?45%  Age >?60 years and/or prior history of thrombosis: Add cytoreduction  Secondary prophylaxis after thrombotic event  ?All patients: Cytoreduction  ?Typical VTE: Consider indefinite VKA for most patients; aspirin if not on VKA  ?Atypical VTE: Indefinite VKA  ?Arterial thrombosis: Aspirin MPL mutation    Minor Criteria  ??? Presence of a clonal marker (e.g., abnormal karyotype)  ??? Absence of evidence for reactive thrombocytosis    Diagnosis: All major criteria or first three major and one minor     PMF  Major criteria  ??? Proliferation and atypia of megakaryocytes accompanied by either reticulin and/or collagen fibrosis  grades 2 or 3 on a scale of 0 to 3  ??? Not meeting WHO criteria for ET, PV, BCR-ABL1+ CML, myelodysplastic syndromes, or other myeloid neoplasm  ??? Presence of JAK2, CALR or MPL mutation or in the absence of these mutations, presence of another clonal  marker or absence of reactive myelofibrosis    Minor Criteria  ??? Anemia not attributed to another comorbid condition  ??? WBC > 11.0  ??? Palpable Splenomegaly   ??? LDH > ULN  ??? Leukoerythroblastosis     Diagnosis: 3 major and at least 1 minor confirmed in two consecutive determinations    IPSET Model for Prediction of Thrombosis    Age:    2   < 60: 0   > 60: 2  WBC count (x10e9)  0   < 11: 0   > 11: 1  Hx of Thrombosis  0   No: 0   Yes: 1  --------------------------------------------  Total:    2     10 yr thrombosis risk, median survival (yrs)  Lo Risk: 0 pts    --> 89%, NR  Int Risk: 1-2 pts --> 86%, 24.5  Hi Risk: 3-4 pts  --> 69%, 14.7    IPSET II  Age:    1   < 60: 0   > 60: 1  CVS Risk Factor*  0   No: 0   Yes: 1   Hx of Thrombosis  0   No: 0   Yes: 2  JAK2     2   No: 0   Yes: 2  --------------------------------------------  Total:    3    *Gave up smoking 10 years ago, near optimal non-LDL on treatment, non HTN    Annual Thrombosis Risk  Lo Risk: 0-1 pts  --> 1.03%  Int Risk: 2 pts     --> 2.35%  Hi Risk: 3-6 pts  --> 3.56%     NOTE  ?? JAK2 conveys a higher risk of thrombosis  ?? CALR conveys a higher risk of bleeding    Newer Model:  [email or web address]    Primary prophylaxis of thrombosis  All patients: Aspirin unless   ?????? VWF activity <30%,  ?????? Platelet >?1 million,  ?????? CALR-mutated low-risk ET    PV with Hct >?45%: Add phlebotomy/cytoreduction to target Hct <?45%  Age >?60 years and/or prior history of thrombosis: Add cytoreduction  Secondary prophylaxis after thrombotic event  ?All patients: Cytoreduction  ?Typical VTE: Consider indefinite VKA for most patients; aspirin if not on VKA  ?Atypical VTE: Indefinite VKA  ?Arterial thrombosis: Aspirin

## 2021-03-03 LAB — TRYPTASE: TRYPTASE LEVEL: 8.2 ng/mL

## 2021-03-05 NOTE — Unmapped (Signed)
Clinical Assessment Needed For: Dose Change  Medication: Jakafi 10mg   Last Fill Date/Day Supply: 02-07-21 / 30  Copay $0  Was previous dose already scheduled to fill: No    Notes to Pharmacist:

## 2021-03-10 NOTE — Unmapped (Signed)
[facility] Refill Coordination Note    Specialty Medication(s) to be Shipped:   Hematology/Oncology: Christy Buchanan 10mg     Other medication(s) to be shipped: No additional medications requested for fill at this time     Christy Buchanan, DOB: March 09, 1955  Phone: [phone] (home)       All above HIPAA information was verified with patient.     Was a Nurse, learning disability used for this call? No    Completed refill call assessment today to schedule patient's medication shipment from the [facility] [phone]).  All relevant notes have been reviewed.     Specialty medication(s) and dose(s) confirmed: Patient reports changes to the regimen as follows: Dose has changed to Jakafi 10mg - 1 tablet by mouth twice daily   Changes to medications: Litzy reports no changes at this time.  Changes to insurance: No  New side effects reported not previously addressed with a pharmacist or physician: None reported  Questions for the pharmacist: No    Confirmed patient received a Conservation officer, historic buildings and a Surveyor, mining with first shipment. The patient will receive a drug information handout for each medication shipped and additional FDA Medication Guides as required.       DISEASE/MEDICATION-SPECIFIC INFORMATION        N/A    SPECIALTY MEDICATION ADHERENCE     Medication Adherence    Patient reported X missed doses in the last month: 0  Specialty Medication: Jakafi 10mg   Patient is on additional specialty medications: No  Informant: patient              Were doses missed due to medication being on hold? No    Jakafi 10 mg: 0 days of medicine on hand . Patient was previously taking Jakafi 5mg       REFERRAL TO PHARMACIST     Referral to the pharmacist: Not needed      [facility]     Shipping address confirmed in Epic.     Delivery Scheduled: Yes, Expected medication delivery date: 03/13/21.     Medication will be delivered via Next Day Courier to the prescription address in Epic WAM.    [name] [facility] Specialty Technician

## 2021-03-12 ENCOUNTER — Ambulatory Visit: Admit: 2021-03-12 | Discharge: 2021-03-12 | Payer: MEDICARE

## 2021-03-12 DIAGNOSIS — Z1231 Encounter for screening mammogram for malignant neoplasm of breast: Principal | ICD-10-CM

## 2021-03-13 DIAGNOSIS — Z1231 Encounter for screening mammogram for malignant neoplasm of breast: Principal | ICD-10-CM

## 2021-03-24 DIAGNOSIS — K219 Gastro-esophageal reflux disease without esophagitis: Principal | ICD-10-CM

## 2021-03-24 MED ORDER — OMEPRAZOLE 20 MG CAPSULE,DELAYED RELEASE
ORAL_CAPSULE | Freq: Every day | ORAL | 2 refills | 90 days | Status: CP
Start: 2021-03-24 — End: ?

## 2021-04-01 MED ORDER — ATORVASTATIN 40 MG TABLET
ORAL_TABLET | Freq: Every day | ORAL | 11 refills | 30 days | Status: CP
Start: 2021-04-01 — End: 2022-04-01

## 2021-04-01 NOTE — Unmapped (Signed)
[facility] Refill Coordination Note    Specialty Medication(s) to be Shipped:   Hematology/Oncology: Christy Buchanan 10mg     Other medication(s) to be shipped: No additional medications requested for fill at this time     Christy Buchanan, DOB: 1955/05/26  Phone: [phone] (home)       All above HIPAA information was verified with patient.     Was a Nurse, learning disability used for this call? No    Completed refill call assessment today to schedule patient's medication shipment from the [facility] [phone]).  All relevant notes have been reviewed.     Specialty medication(s) and dose(s) confirmed: Patient reports changes to the regimen as follows: Dose has changed to Jakafi 10mg - 1 tablet by mouth twice daily   Changes to medications: [name] reports no changes at this time.  Changes to insurance: No  New side effects reported not previously addressed with a pharmacist or physician: None reported  Questions for the pharmacist: No    Confirmed patient received a Conservation officer, historic buildings and a Surveyor, mining with first shipment. The patient will receive a drug information handout for each medication shipped and additional FDA Medication Guides as required.       DISEASE/MEDICATION-SPECIFIC INFORMATION        N/A    SPECIALTY MEDICATION ADHERENCE     Medication Adherence    Patient reported X missed doses in the last month: 0  Specialty Medication: Jakafi 10mg   Patient is on additional specialty medications: No  Informant: patient              Were doses missed due to medication being on hold? No    Jakafi 10 mg: 12 days of medicine on hand .     REFERRAL TO PHARMACIST     Referral to the pharmacist: Not needed      [facility]     Shipping address confirmed in Epic.     Delivery Scheduled: Yes, Expected medication delivery date: 04/10/21.     Medication will be delivered via Next Day Courier to the prescription address in Epic WAM.    Christy Buchanan   [facility] Specialty Technician

## 2021-04-09 MED FILL — JAKAFI 10 MG TABLET: ORAL | 30 days supply | Qty: 60 | Fill #1

## 2021-05-01 NOTE — Unmapped (Signed)
[facility] Refill Coordination Note    Specialty Medication(s) to be Shipped:   Hematology/Oncology: [name]    Other medication(s) to be shipped: No additional medications requested for fill at this time     LEILANNY FLUITT, DOB: 06/08/1955  Phone: [phone] (home)       All above HIPAA information was verified with patient.     Was a Nurse, learning disability used for this call? No    Completed refill call assessment today to schedule patient's medication shipment from the [facility] [phone]).  All relevant notes have been reviewed.     Specialty medication(s) and dose(s) confirmed: Regimen is correct and unchanged.   Changes to medications: Alezandra reports no changes at this time.  Changes to insurance: No  New side effects reported not previously addressed with a pharmacist or physician: None reported  Questions for the pharmacist: No    Confirmed patient received a Conservation officer, historic buildings and a Surveyor, mining with first shipment. The patient will receive a drug information handout for each medication shipped and additional FDA Medication Guides as required.       DISEASE/MEDICATION-SPECIFIC INFORMATION        N/A    SPECIALTY MEDICATION ADHERENCE     Medication Adherence    Patient reported X missed doses in the last month: 0  Specialty Medication: Jakafi 10mg   Patient is on additional specialty medications: No  Patient is on more than two specialty medications: No  Any gaps in refill history greater than 2 weeks in the last 3 months: no  Demonstrates understanding of importance of adherence: yes  Informant: patient              Were doses missed due to medication being on hold? No    Jakafi 10mg : Patient has 7 days of medication on hand     REFERRAL TO PHARMACIST     Referral to the pharmacist: Not needed      [facility]     Shipping address confirmed in Epic.     Delivery Scheduled: Yes, Expected medication delivery date: 3/28.     Medication will be delivered via Next Day Courier to the prescription address in Epic WAM.    Olga Millers   [facility] Specialty Technician

## 2021-05-05 DIAGNOSIS — D473 Essential (hemorrhagic) thrombocythemia: Principal | ICD-10-CM

## 2021-05-06 NOTE — Unmapped (Signed)
Christy Buchanan 's Jakafi shipment will be delayed as a result of a high copay.     I have reached out to the patient  at [phone] and left a voicemail message.  We will call the patient back to reschedule the delivery upon resolution. We have not confirmed the new delivery date.

## 2021-05-07 MED FILL — JAKAFI 10 MG TABLET: ORAL | 30 days supply | Qty: 60 | Fill #2

## 2021-05-07 NOTE — Unmapped (Signed)
Christy Buchanan returned [facility] phone call regarding the delay in her [place] shipment.  She left a voice message   I have reached out to the patient  at [phone] and left a voicemail message. for her to contact the MAPs team for outstanding items needed to obtain a grant for the high copay.  She can contact Dereck Ligas at [phone] or  Clovis Pu at [phone]     Horace Porteous, PharmD  [facility]

## 2021-05-07 NOTE — Unmapped (Signed)
Calton Golds 's Jakafi shipment will be sent out  as a result of high copay referral resolved and grant funding available.    I have reached out to the patient  at [phone] and left a voicemail message.  I spoke with patient earlier today and was given permission to leave a detailed voice message regarding the status of the grant including scheduling a same day delivery for today if possible as she is out of medication.  I reached out to patient and left a detailed voice message confirming same day delivery for today.   We will reschedule the medication for the delivery date that the patient agreed upon.  We have confirmed the delivery date as 05/07/21, via same day courier.     Horace Porteous, PharmD  [facility]

## 2021-05-14 ENCOUNTER — Ambulatory Visit: Admit: 2021-05-14 | Discharge: 2021-05-15 | Payer: MEDICARE

## 2021-05-14 DIAGNOSIS — E0500 Thyrotoxicosis with diffuse goiter without thyrotoxic crisis or storm: Principal | ICD-10-CM

## 2021-05-14 DIAGNOSIS — E032 Hypothyroidism due to medicaments and other exogenous substances: Principal | ICD-10-CM

## 2021-05-14 LAB — TSH: THYROID STIMULATING HORMONE: 2.073 u[IU]/mL (ref 0.550–4.780)

## 2021-05-14 LAB — T4, FREE: FREE T4: 0.86 ng/dL — ABNORMAL LOW (ref 0.89–1.76)

## 2021-05-14 NOTE — Unmapped (Signed)
Reason for follow up: hyperthyroidism.    Referring Provider: Timoteo Ace Dev*    Primary Care Provider: Cathlean Marseilles, DO      Assessment/Plan:     1. 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   - she has been on low dose methimazole 2.5 mg every day since then.   - recently has been having worsening fatigue and TFTs in mild hypothyroid ranges  - additional labs showed negative TPO, TSI and TRAb antibodies  - MMZ stopped and subsequent TFTs close to normal  - clinically euthyroid today  Plan:  - re-check TFTs today. If still normal, will re-check again in 3 months and every 6-12 mo thereafter.   - patient knows to call and/or RTC if having any new and/or progressive symptoms of hypo or hyperthyrodism    2. Hypothyroidism due to medication  - see above    All questions were answered and patient agrees with plan.   Return in about 3 months (around 08/13/2021) for [employer], In-person.      Thank you for referring your patient to our endocrine clinic for evaluation. Please do not hesitate to contact me with any questions.     Tawanna Sat, MD  [facility]  Phone [phone]  Fax [phone]    Subjective:       Christy Buchanan is a 66 y.o. 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 Last seen on 01/21/21      Interval hx 05/14/21    Patient feels better now that she is off MMZ, with improved depression symptoms and fatigue. Overall doing well.     Initial encounter 01/21/22  Patient has long standing history of hyperthyroidism due to Graves disease last seen at [facility] on 12/2017 by Dr. Tedd Sias, she was on low dose methimazole 2.5 mg every day.      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     The patient reports   Anterior neck pain: no  Anterior neck swelling: no  Recent URI: no  Dysphagia: no  Hoarseness: no  Tremors: no  Palpitations: no  Weight loss: no  Change in bowel habits: no  Energy: fatigue   Insomnia: no  Change in vision or diplopia or eye discomfort: no    Periods: post-menopause    She denies taking lithium, amiodarone, biotin supplements or thyroid supplements.      FH: mother she had thyroid cancer s/p hemithyroidectomy.      Past Medical History:   Diagnosis Date    Autoimmune thyroiditis     Breast cancer (CMS-HCC)     GERD (gastroesophageal reflux disease)     Hyperlipidemia     Osteoporosis     Sjogren's disease (CMS-HCC)     Thrombocythemia     Vitamin D deficiency        No Known Allergies  Family History   Problem Relation Age of Onset    Cancer Mother     Heart disease Mother     Cancer Father     Breast cancer Neg Hx  Social History     Socioeconomic History    Marital status: Divorced     Spouse name: None    Number of children: None    Years of education: None    Highest education level: None   Tobacco Use    Smoking status: Former     Types: Cigarettes    Smokeless tobacco: Never   Vaping Use    Vaping Use: Never used   Substance and Sexual Activity    Alcohol use: Yes     Comment: rare     Social Determinants of Psychologist, prison and probation services Strain: Medium Risk    Difficulty of Paying Living Expenses: Somewhat hard   Food Insecurity: No Food Insecurity    Worried About Programme researcher, broadcasting/film/video in the Last Year: Never true    Ran Out of Food in the Last Year: Never true   Transportation Needs: No Transportation Needs    Lack of Transportation (Medical): No    Lack of Transportation (Non-Medical): No      Past Surgical History:   Procedure Laterality Date    AUGMENTATION MAMMAPLASTY Bilateral     23ish yrs    BREAST BIOPSY Left malignant    CHEMOTHERAPY      MASTECTOMY Left 25 ish yrs ago    RADIATION Left         Current Outpatient Medications:     aspirin (ECOTRIN) 81 MG tablet, Take 1 tablet (81 mg total) by mouth., Disp: , Rfl:     atorvastatin (LIPITOR) 40 MG tablet, Take 1 tablet (40 mg total) by mouth daily., Disp: 30 tablet, Rfl: 11    cetirizine (ZYRTEC) 10 MG tablet, Take 1 tablet (10 mg total) by mouth every other day., Disp: , Rfl:     citalopram (CELEXA) 10 MG tablet, Take 1 tablet (10 mg total) by mouth Take as directed., Disp: , Rfl:     multivitamin (TAB-A-VITE/THERAGRAN) per tablet, Take 1 tablet by mouth daily., Disp: , Rfl:     omega-3 acid ethyl esters (LOVAZA) 1 gram capsule, Take 1 capsule (1 g total) by mouth., Disp: , Rfl:     omega-3 fatty acids-fish oil 300-1,000 mg cap capsule, Take 1 capsule by mouth in the morning., Disp: , Rfl:     omeprazole (PRILOSEC) 20 MG capsule, Take 1 capsule (20 mg total) by mouth daily. TAKE 1 CAPSULE(20 MG) BY MOUTH DAILY, Disp: 90 capsule, Rfl: 2    raloxifene (EVISTA) 60 mg tablet, Take 1 tablet (60 mg total) by mouth daily., Disp: 90 tablet, Rfl: 3    ruxolitinib (JAKAFI) 10 mg tablet, Take 1 tablet (10 mg total) by mouth Two (2) times a day., Disp: 60 tablet, Rfl: 11      Review of Systems  A 12 point review of systems was otherwise negative except as noted in the HPI.      Objective:      BP 136/72  - Pulse 74  - Ht 162.6 cm (5' 4.02)  - Wt 58.5 kg (129 lb)  - BMI 22.13 kg/m??     Wt Readings from Last 3 Encounters:   05/14/21 58.5 kg (129 lb)   02/27/21 58.2 kg (128 lb 4.8 oz)   01/21/21 58.8 kg (129 lb 9.6 oz)      BMI Readings from Last 3 Encounters:   05/14/21 22.13 kg/m??   02/27/21 22.01 kg/m??   01/21/21 22.23 kg/m??      GEN: appears well, in  NAD  HEENT: sclerae anicteric  NECK:  no visible neck mass or deformity  CHEST: normal breathing chest movements  NEURO: Aox3, following commands  PSYCH: normal affect.  SKIN: no visible rash     Lab Reviewed:    Component Latest Ref Rng 01/21/2021 02/27/2021   TSH      0.550 - 4.780 uIU/mL 7.668 (H)  3.748    Free T4      0.89 - 1.76 ng/dL 0.98  1.19 (L)    T3, Total      60.0 - 180.0 ng/dL 147.8     Thyroid Stimulating Immunoglob      <=1.3 TSI index <1.0     Thyrotropin Receptor Ab      0.00 - 1.75 IU/L <1.10     Thyroid Peroxidase Ab      <=60 U/mL 48        (H) High  (L) Low    TSH (uIU/mL)   Date Value   02/27/2021 3.748   01/21/2021 7.668 (H)   08/29/2020 4.872 (H)   05/07/2020 5.280 (H)   03/15/2019 2.785   04/20/2018 1.790     T3, Free (pg/mL)   Date Value   08/29/2020 2.82     Free T4 (ng/dL)   Date Value   29/56/2130 0.86 (L)   01/21/2021 0.98   08/29/2020 0.97   03/15/2019 0.85   04/20/2018 0.73     Thyroid Peroxidase Ab (U/mL)   Date Value   01/21/2021 48     Thyroid Stimulating Immunoglob (TSI index)   Date Value   01/21/2021 <1.0     Labs from 2015 TSH < 0.02,  2016 at [facility] showed positive Tg Ab and TPO ab     Radiology reviewed:  ([facility]) Neck ultrasound in 01/2015 showed heterogenous appearing thyroid without nodules.

## 2021-05-14 NOTE — Unmapped (Signed)
It was a pleasure seeing you today at our [facility] clinic.   If any testing was ordered today, most of the time the results will appear in your MyChart account even before I see them. Once all your results are available, I will contact you within 1-2 weeks with an explanation. Please contact me if you do not hear from me by then.   Please make sure you have access to MyChart since this is how I will communicate with you.  If you do not have MyChart, I will send you a letter which may take 1-2 weeks to arrive.      Remember to call us with any urgent issues. Please allow a few days for mychart messages to be read and answered.     If you are seen for diabetes, please bring your glucometer and all devices to every visit.   All refill requests must be submitted 14 days in advance to allow enough time to be processed.

## 2021-05-22 DIAGNOSIS — D473 Essential (hemorrhagic) thrombocythemia: Principal | ICD-10-CM

## 2021-05-22 MED ORDER — RUXOLITINIB 10 MG TABLET
ORAL_TABLET | Freq: Two times a day (BID) | ORAL | 11 refills | 30 days | Status: CP
Start: 2021-05-22 — End: ?

## 2021-05-23 DIAGNOSIS — D473 Essential (hemorrhagic) thrombocythemia: Principal | ICD-10-CM

## 2021-05-23 MED ORDER — RUXOLITINIB 10 MG TABLET
ORAL_TABLET | Freq: Two times a day (BID) | ORAL | 11 refills | 30 days | Status: CP
Start: 2021-05-23 — End: ?
  Filled 2021-05-26: qty 60, 30d supply, fill #0

## 2021-05-23 NOTE — Unmapped (Signed)
[facility] Clinical Assessment & Refill Coordination Note    Christy Buchanan, DOB: 06/22/1955  Phone: [phone] (home)     All above HIPAA information was verified with patient.     Was a Nurse, learning disability used for this call? No    Specialty Medication(s):   Hematology/Oncology: [id number]     Current Outpatient Medications   Medication Sig Dispense Refill   ??? aspirin (ECOTRIN) 81 MG tablet Take 1 tablet (81 mg total) by mouth.     ??? atorvastatin (LIPITOR) 40 MG tablet Take 1 tablet (40 mg total) by mouth daily. 30 tablet 11   ??? cetirizine (ZYRTEC) 10 MG tablet Take 1 tablet (10 mg total) by mouth every other day.     ??? citalopram (CELEXA) 10 MG tablet Take 1 tablet (10 mg total) by mouth Take as directed.     ??? multivitamin (TAB-A-VITE/THERAGRAN) per tablet Take 1 tablet by mouth daily.     ??? omega-3 acid ethyl esters (LOVAZA) 1 gram capsule Take 1 capsule (1 g total) by mouth.     ??? omega-3 fatty acids-fish oil 300-1,000 mg cap capsule Take 1 capsule by mouth in the morning.     ??? omeprazole (PRILOSEC) 20 MG capsule Take 1 capsule (20 mg total) by mouth daily. TAKE 1 CAPSULE(20 MG) BY MOUTH DAILY 90 capsule 2   ??? raloxifene (EVISTA) 60 mg tablet Take 1 tablet (60 mg total) by mouth daily. 90 tablet 3   ??? ruxolitinib (JAKAFI) 10 mg tablet Take 1 tablet (10 mg total) by mouth Two (2) times a day. 60 tablet 11     No current facility-administered medications for this visit.        Changes to medications: Christy Buchanan reports no changes at this time.    No Known Allergies    Changes to allergies: No    SPECIALTY MEDICATION ADHERENCE     Jakafi 10 mg: 5 days of medicine on hand     Medication Adherence    Patient reported X missed doses in the last month: 0  Specialty Medication: Jakafi 10 mg tablets - 1 tablet (10 mg) twice daily  Patient is on additional specialty medications: No  Informant: patient  Confirmed plan for next specialty medication refill: delivery by pharmacy  Refills needed for supportive medications: not needed          Specialty medication(s) dose(s) confirmed: Directions on prescription is for 10 mg twice daily.  Patient has been taking 10 mg AM and 20 mg PM.     Are there any concerns with adherence? Christy Buchanan was on Jakafi 5 mg tablets and taking 1 tab AM (5 mg) and 2 tabs PM (10 mg) prior to 02/27/21.  Per notes from office visit on 02/27/21 discussed increasing dose to 10 mg twice daily.  Christy Buchanan received Jakafi 10 mg tabs with directions 1 tablet twice daily which she had been taking correctly. However recently she resumed taking 1 tab (10 mg) AM and 2 tabs (20 mg) PM accidentally and is now short of medication. This was discovered when she called for an early refill.    Adherence counseling provided? Yes: Discussed directions on bottle.  Will also reach out to Christy Buchanan for new script as her previous one was discontinued and routed to [facility] (they are not able to fill this medication)    CLINICAL MANAGEMENT AND INTERVENTION      Clinical Benefit Assessment:    Do you feel the medicine  is effective or helping your condition? Yes    Clinical Benefit counseling provided? Not needed    Adverse Effects Assessment:    Are you experiencing any side effects? No side effects reported even with taking higher dose.  She is a little fatigued but not like when she was previously taking 20 mg twice daily    Are you experiencing difficulty administering your medicine? No    Quality of Life Assessment:    Quality of Life    Rheumatology  Oncology  2. On a scale of 1-10, how would you rate your ability to manage side effects associated with your specialty medication? (1=no issues, 10 = unable to take medication due to side effects): 2  Dermatology  Cystic Fibrosis          How many days over the past month did your Essential thrombocythemia   keep you from your normal activities? For example, brushing your teeth or getting up in the morning. 0    Have you discussed this with your provider? Not needed    Acute Infection Status:    Acute infections noted within Epic:  No active infections  Patient reported infection: None    Therapy Appropriateness:    Is therapy appropriate and patient progressing towards therapeutic goals? Yes, therapy is appropriate and should be continued    DISEASE/MEDICATION-SPECIFIC INFORMATION      N/A    PATIENT SPECIFIC NEEDS     - Does the patient have any physical, cognitive, or cultural barriers? No    - Is the patient high risk? No    - Does the patient require a Care Management Plan? No     SOCIAL DETERMINANTS OF HEALTH     At the [facility], we have learned that life circumstances - like trouble affording food, housing, utilities, or transportation can affect the health of many of our patients.   That is why we wanted to ask: are you currently experiencing any life circumstances that are negatively impacting your health and/or quality of life? No    Social Determinants of Health     Financial Resource Strain: Medium Risk   ??? Difficulty of Paying Living Expenses: Somewhat hard   Internet Connectivity: Not on file   Food Insecurity: No Food Insecurity   ??? Worried About Running Out of Food in the Last Year: Never true   ??? Ran Out of Food in the Last Year: Never true   Tobacco Use: Medium Risk   ??? Smoking Tobacco Use: Former   ??? Smokeless Tobacco Use: Never   ??? Passive Exposure: Not on file   Housing/Utilities: Low Risk    ??? Within the past 12 months, have you ever stayed: outside, in a car, in a tent, in an overnight shelter, or temporarily in someone else's home (i.e. couch-surfing)?: No   ??? Are you worried about losing your housing?: No   ??? Within the past 12 months, have you been unable to get utilities (heat, electricity) when it was really needed?: No   Alcohol Use: Not on file   Transportation Needs: No Transportation Needs   ??? Lack of Transportation (Medical): No   ??? Lack of Transportation (Non-Medical): No   Substance Use: Not on file   Health Literacy: Not on file   Physical Activity: Not on file   Interpersonal Safety: Not on file   Stress: Not on file   Intimate Partner Violence: Not on file   Depression: Not on file  Social Connections: Not on file       Would you be willing to receive help with any of the needs that you have identified today? Not applicable       SHIPPING     Specialty Medication(s) to be Shipped:   Hematology/Oncology: [name]    Other medication(s) to be shipped: No additional medications requested for fill at this time     Changes to insurance: No    Delivery Scheduled: Yes, Expected medication delivery date: 05/26/21.  However, Rx request for refills was sent to the provider as there are none remaining.     Medication will be delivered via Same Day Courier to the confirmed prescription address in [facility].    The patient will receive a drug information handout for each medication shipped and additional FDA Medication Guides as required.  Verified that patient has previously received a Conservation officer, historic buildings and a Surveyor, mining.    The patient or caregiver noted above participated in the development of this care plan and knows that they can request review of or adjustments to the care plan at any time.      All of the patient's questions and concerns have been addressed.    Breck Coons [facility] Specialty Pharmacist

## 2021-05-28 NOTE — Unmapped (Signed)
Sent pt wellapp msg re provider bump to 8/1

## 2021-06-13 NOTE — Unmapped (Signed)
[facility] Refill Coordination Note    Specialty Medication(s) to be Shipped:   Hematology/Oncology: Christy Buchanan    Other medication(s) to be shipped: No additional medications requested for fill at this time     Christy Buchanan, DOB: 1955/05/05  Phone: [phone] (home)       All above HIPAA information was verified with patient.     Was a Nurse, learning disability used for this call? No    Completed refill call assessment today to schedule patient's medication shipment from the [facility] [phone]).  All relevant notes have been reviewed.     Specialty medication(s) and dose(s) confirmed: Regimen is correct and unchanged.   Changes to medications: [name] reports no changes at this time.  Changes to insurance: No  New side effects reported not previously addressed with a pharmacist or physician: None reported  Questions for the pharmacist: No    Confirmed patient received a Conservation officer, historic buildings and a Surveyor, mining with first shipment. The patient will receive a drug information handout for each medication shipped and additional FDA Medication Guides as required.       DISEASE/MEDICATION-SPECIFIC INFORMATION        N/A    SPECIALTY MEDICATION ADHERENCE     Medication Adherence    Patient reported X missed doses in the last month: 0  Specialty Medication: Jakafi 10 mg  Patient is on additional specialty medications: No              Were doses missed due to medication being on hold? No    Jakafi 10mg : Patient has 12 days of medication on hand     REFERRAL TO PHARMACIST     Referral to the pharmacist: Not needed      [facility]     Shipping address confirmed in Epic.     Delivery Scheduled: Yes, Expected medication delivery date: 06/19/21.     Medication will be delivered via Next Day Courier to the prescription address in Epic WAM.    [place] A Christy Buchanan   [facility] Specialty Technician

## 2021-06-19 MED FILL — JAKAFI 10 MG TABLET: ORAL | 30 days supply | Qty: 60 | Fill #1

## 2021-07-10 NOTE — Unmapped (Signed)
[facility] Clinical Assessment & Refill Coordination Note    Christy Buchanan, DOB: 12/02/1955  Phone: [phone] (home)     All above HIPAA information was verified with patient.     Was a Nurse, learning disability used for this call? No    Specialty Medication(s):   Hematology/Oncology: [id number]     Current Outpatient Medications   Medication Sig Dispense Refill    aspirin (ECOTRIN) 81 MG tablet Take 1 tablet (81 mg total) by mouth.      atorvastatin (LIPITOR) 40 MG tablet Take 1 tablet (40 mg total) by mouth daily. 30 tablet 11    cetirizine (ZYRTEC) 10 MG tablet Take 1 tablet (10 mg total) by mouth every other day.      citalopram (CELEXA) 10 MG tablet Take 1 tablet (10 mg total) by mouth Take as directed.      multivitamin (TAB-A-VITE/THERAGRAN) per tablet Take 1 tablet by mouth daily.      omega-3 acid ethyl esters (LOVAZA) 1 gram capsule Take 1 capsule (1 g total) by mouth.      omega-3 fatty acids-fish oil 300-1,000 mg cap capsule Take 1 capsule by mouth in the morning.      omeprazole (PRILOSEC) 20 MG capsule Take 1 capsule (20 mg total) by mouth daily. TAKE 1 CAPSULE(20 MG) BY MOUTH DAILY 90 capsule 2    raloxifene (EVISTA) 60 mg tablet Take 1 tablet (60 mg total) by mouth daily. 90 tablet 3    ruxolitinib (JAKAFI) 10 mg tablet Take 1 tablet (10 mg total) by mouth Two (2) times a day. 60 tablet 11    ruxolitinib (JAKAFI) 10 mg tablet Take 1 tablet (10 mg total) by mouth Two (2) times a day. 60 tablet 11     No current facility-administered medications for this visit.        Changes to medications: Christy Buchanan reports no changes at this time.    No Known Allergies    Changes to allergies: No    SPECIALTY MEDICATION ADHERENCE     Jakafi 10 mg: 15 days of medicine on hand     Medication Adherence    Patient reported X missed doses in the last month: 0  Specialty Medication: Jakafi 10 mg - 1 tab twice daily  Patient is on additional specialty medications: No  Informant: patient  Confirmed plan for next specialty medication refill: delivery by pharmacy  Refills needed for supportive medications: not needed          Specialty medication(s) dose(s) confirmed: Regimen is correct and unchanged.     Are there any concerns with adherence? No    Adherence counseling provided? Not needed    CLINICAL MANAGEMENT AND INTERVENTION      Clinical Benefit Assessment:    Do you feel the medicine is effective or helping your condition? Yes    Clinical Benefit counseling provided? Not needed    Adverse Effects Assessment:    Are you experiencing any side effects? Yes, patient reports experiencing mild constipation. She is taking a stool softener and eating a healthy diet. Side effect counseling provided: Suggested adding Miralax as needed and continue getting sufficient hydration    Are you experiencing difficulty administering your medicine? No    Quality of Life Assessment:    Quality of Life    Rheumatology  Oncology  1. What impact has your specialty medication had on the reduction of your daily pain or discomfort level?: None  2. On a scale of  1-10, how would you rate your ability to manage side effects associated with your specialty medication? (1=no issues, 10 = unable to take medication due to side effects): 2  Dermatology  Cystic Fibrosis          How many days over the past month did your Essential thrombocythemia  keep you from your normal activities? For example, brushing your teeth or getting up in the morning. 0    Have you discussed this with your provider? Not needed    Acute Infection Status:    Acute infections noted within Epic:  No active infections  Patient reported infection: None    Therapy Appropriateness:    Is therapy appropriate and patient progressing towards therapeutic goals? Yes, therapy is appropriate and should be continued    DISEASE/MEDICATION-SPECIFIC INFORMATION      N/A    PATIENT SPECIFIC NEEDS     Does the patient have any physical, cognitive, or cultural barriers? No    Is the patient high risk? No    Does the patient require a Care Management Plan? No     SOCIAL DETERMINANTS OF HEALTH     At the [facility], we have learned that life circumstances - like trouble affording food, housing, utilities, or transportation can affect the health of many of our patients.   That is why we wanted to ask: are you currently experiencing any life circumstances that are negatively impacting your health and/or quality of life? No    Social Determinants of Psychologist, prison and probation services Strain: Medium Risk    Difficulty of Paying Living Expenses: Somewhat hard   Internet Connectivity: Not on file   Food Insecurity: No Food Insecurity    Worried About Programme researcher, broadcasting/film/video in the Last Year: Never true    Ran Out of Food in the Last Year: Never true   Tobacco Use: Medium Risk    Smoking Tobacco Use: Former    Smokeless Tobacco Use: Never    Passive Exposure: Not on file   Housing/Utilities: Low Risk     Within the past 12 months, have you ever stayed: outside, in a car, in a tent, in an overnight shelter, or temporarily in someone else's home (i.e. couch-surfing)?: No    Are you worried about losing your housing?: No    Within the past 12 months, have you been unable to get utilities (heat, electricity) when it was really needed?: No   Alcohol Use: Not on file   Transportation Needs: No Transportation Needs    Lack of Transportation (Medical): No    Lack of Transportation (Non-Medical): No   Substance Use: Not on file   Health Literacy: Not on file   Physical Activity: Not on file   Interpersonal Safety: Not on file   Stress: Not on file   Intimate Partner Violence: Not on file   Depression: Not on file   Social Connections: Not on file       Would you be willing to receive help with any of the needs that you have identified today? Not applicable       SHIPPING     Specialty Medication(s) to be Shipped:   Hematology/Oncology: [name]    Other medication(s) to be shipped: No additional medications requested for fill at this time     Changes to insurance: No    Delivery Scheduled: Yes, Expected medication delivery date: 07/23/21.     Medication will be delivered via Next Day Courier  to the confirmed prescription address in [facility].    The patient will receive a drug information handout for each medication shipped and additional FDA Medication Guides as required.  Verified that patient has previously received a Conservation officer, historic buildings and a Surveyor, mining.    The patient or caregiver noted above participated in the development of this care plan and knows that they can request review of or adjustments to the care plan at any time.      All of the patient's questions and concerns have been addressed.    Breck Coons [facility] Specialty Pharmacist

## 2021-07-22 MED FILL — JAKAFI 10 MG TABLET: ORAL | 30 days supply | Qty: 60 | Fill #2

## 2021-08-11 NOTE — Unmapped (Signed)
[facility] Refill Coordination Note    Specialty Medication(s) to be Shipped:   Hematology/Oncology: Christy Buchanan    Other medication(s) to be shipped: No additional medications requested for fill at this time     Christy Buchanan, DOB: 10-07-1955  Phone: [phone] (home)       All above HIPAA information was verified with patient.     Was a Nurse, learning disability used for this call? No    Completed refill call assessment today to schedule patient's medication shipment from the [facility] [phone]).  All relevant notes have been reviewed.     Specialty medication(s) and dose(s) confirmed: Regimen is correct and unchanged.   Changes to medications: Christy Buchanan reports no changes at this time.  Changes to insurance: No  New side effects reported not previously addressed with a pharmacist or physician: None reported  Questions for the pharmacist: No    Confirmed patient received a Conservation officer, historic buildings and a Surveyor, mining with first shipment. The patient will receive a drug information handout for each medication shipped and additional FDA Medication Guides as required.       DISEASE/MEDICATION-SPECIFIC INFORMATION        N/A    SPECIALTY MEDICATION ADHERENCE     Medication Adherence    Patient reported X missed doses in the last month: 0  Specialty Medication: Jakafi 10 mg  Patient is on additional specialty medications: No  Informant: patient              Were doses missed due to medication being on hold? No    jakafi 10mg   : 14 days of medicine on hand       REFERRAL TO PHARMACIST     Referral to the pharmacist: Not needed      [facility]     Shipping address confirmed in Epic.     Delivery Scheduled: Yes, Expected medication delivery date: 7/12.     Medication will be delivered via Next Day Courier to the prescription address in Epic WAM.    Westley Gambles   [facility] Specialty Technician

## 2021-08-19 ENCOUNTER — Ambulatory Visit: Admit: 2021-08-19 | Discharge: 2021-08-20 | Payer: MEDICARE

## 2021-08-19 DIAGNOSIS — E032 Hypothyroidism due to medicaments and other exogenous substances: Principal | ICD-10-CM

## 2021-08-19 DIAGNOSIS — E0500 Thyrotoxicosis with diffuse goiter without thyrotoxic crisis or storm: Principal | ICD-10-CM

## 2021-08-19 LAB — TSH: THYROID STIMULATING HORMONE: 2.283 u[IU]/mL (ref 0.550–4.780)

## 2021-08-19 MED FILL — JAKAFI 10 MG TABLET: ORAL | 30 days supply | Qty: 60 | Fill #3

## 2021-08-19 NOTE — Unmapped (Signed)
Reason for follow up: hyperthyroidism.    Referring Provider: Timoteo Ace Dev*    Primary Care Provider: Cathlean Marseilles, DO      Assessment/Plan:     1. 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 She has been on low dose methimazole 2.5 mg every day since then.   - Recently has been having worsening fatigue and TFTs in mild hypothyroid ranges. Additional labs showed negative TPO, TSI and TRAb antibodies. MMZ stopped 01/2021 and subsequent TFTs remain  normal  - clinically euthyroid today  Plan:  - re-check TFTs today. If still normal, will re-check again in 4 months and every 6-12 mo thereafter.   - patient knows to call and/or RTC if having any new and/or progressive symptoms of hypo or hyperthyrodism    2. Hypothyroidism due to medication  - see above    3. Mild vertigo  - not persistent or worsening. Had fall. Denies fracture pain.   - encouraged to discuss with PCP for additional work up.     All questions were answered and patient agrees with plan.   Return in about 4 months (around 12/20/2021) for [employer], In-person.      Thank you for referring your patient to our endocrine clinic for evaluation. Please do not hesitate to contact me with any questions.     Tawanna Sat, MD  [facility]  Phone [phone]  Fax [phone]    Subjective:       Christy Buchanan is a 66 y.o. 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 Last seen on 05/14/21    Interval hx 08/19/21    Patient remains off MMZ. Last TFTs on 05/2021 overall normal. She feels at her baseline.     Of notice, reports recent episode of vertigo and fall. Not currently.         Interval hx 05/14/21    Patient feels better now that she is off MMZ, with improved depression symptoms and fatigue. Overall doing well.     Initial encounter 01/21/22  Patient has long standing history of hyperthyroidism due to Graves disease last seen at [facility] on 12/2017 by Dr. Tedd Sias, she was on low dose methimazole 2.5 mg every day.      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     The patient reports   Anterior neck pain: no  Anterior neck swelling: no  Recent URI: no  Dysphagia: no  Hoarseness: no  Tremors: no  Palpitations: no  Weight loss: no  Change in bowel habits: no  Energy: fatigue   Insomnia: no  Change in vision or diplopia or eye discomfort: no    Periods: post-menopause    She denies taking lithium, amiodarone, biotin supplements or thyroid supplements.      FH: mother she had thyroid cancer s/p hemithyroidectomy.      Past Medical History:   Diagnosis Date    Autoimmune thyroiditis     Breast cancer (CMS-HCC)     GERD (gastroesophageal reflux disease)     Hyperlipidemia     Osteoporosis  Sjogren's disease (CMS-HCC)     Thrombocythemia     Vitamin D deficiency        No Known Allergies  Family History   Problem Relation Age of Onset    Cancer Mother     Heart disease Mother     Cancer Father     Breast cancer Neg Hx      Social History     Socioeconomic History    Marital status: Divorced     Spouse name: None    Number of children: None    Years of education: None    Highest education level: None   Tobacco Use    Smoking status: Former     Types: Cigarettes    Smokeless tobacco: Never   Vaping Use    Vaping Use: Never used   Substance and Sexual Activity    Alcohol use: Yes     Comment: rare     Social Determinants of Psychologist, prison and probation services Strain: Medium Risk    Difficulty of Paying Living Expenses: Somewhat hard   Food Insecurity: No Food Insecurity    Worried About Programme researcher, broadcasting/film/video in the Last Year: Never true    Ran Out of Food in the Last Year: Never true   Transportation Needs: No Transportation Needs    Lack of Transportation (Medical): No    Lack of Transportation (Non-Medical): No      Past Surgical History:   Procedure Laterality Date    AUGMENTATION MAMMAPLASTY Bilateral     23ish yrs    BREAST BIOPSY Left     malignant    CHEMOTHERAPY      MASTECTOMY Left 25 ish yrs ago    RADIATION Left         Current Outpatient Medications:     aspirin (ECOTRIN) 81 MG tablet, Take 1 tablet (81 mg total) by mouth., Disp: , Rfl:     atorvastatin (LIPITOR) 40 MG tablet, Take 1 tablet (40 mg total) by mouth daily., Disp: 30 tablet, Rfl: 11    cetirizine (ZYRTEC) 10 MG tablet, Take 1 tablet (10 mg total) by mouth every other day., Disp: , Rfl:     citalopram (CELEXA) 10 MG tablet, Take 1 tablet (10 mg total) by mouth Take as directed., Disp: , Rfl:     multivitamin (TAB-A-VITE/THERAGRAN) per tablet, Take 1 tablet by mouth daily., Disp: , Rfl:     omega-3 acid ethyl esters (LOVAZA) 1 gram capsule, Take 1 capsule (1 g total) by mouth., Disp: , Rfl:     omega-3 fatty acids-fish oil 300-1,000 mg cap capsule, Take 1 capsule by mouth in the morning., Disp: , Rfl:     omeprazole (PRILOSEC) 20 MG capsule, Take 1 capsule (20 mg total) by mouth daily. TAKE 1 CAPSULE(20 MG) BY MOUTH DAILY, Disp: 90 capsule, Rfl: 2    raloxifene (EVISTA) 60 mg tablet, Take 1 tablet (60 mg total) by mouth daily., Disp: 90 tablet, Rfl: 3    ruxolitinib (JAKAFI) 10 mg tablet, Take 1 tablet (10 mg total) by mouth Two (2) times a day., Disp: 60 tablet, Rfl: 11    ruxolitinib (JAKAFI) 10 mg tablet, Take 1 tablet (10 mg total) by mouth Two (2) times a day., Disp: 60 tablet, Rfl: 11      Review of Systems  A 12 point review of systems was otherwise negative except as noted in the HPI.      Objective:  BP 143/75  - Pulse 73  - Ht 162.6 cm (5' 4.02)  - Wt 57.4 kg (126 lb 9.6 oz)  - BMI 21.72 kg/m??     Wt Readings from Last 3 Encounters: 08/19/21 57.4 kg (126 lb 9.6 oz)   05/14/21 58.5 kg (129 lb)   02/27/21 58.2 kg (128 lb 4.8 oz)      BMI Readings from Last 3 Encounters:   08/19/21 21.72 kg/m??   05/14/21 22.13 kg/m??   02/27/21 22.01 kg/m??      GEN: appears well, in NAD  HEENT: sclerae anicteric  NECK:  no visible neck mass or deformity, no palpable thyroid mass  CHEST: normal breathing chest movements  NEURO: Aox3, following commands  PSYCH: normal affect.  SKIN: no visible rash     Lab Reviewed:    Component      Latest Ref Rng 01/21/2021 02/27/2021   TSH      0.550 - 4.780 uIU/mL 7.668 (H)  3.748    Free T4      0.89 - 1.76 ng/dL 1.61  0.96 (L)    T3, Total      60.0 - 180.0 ng/dL 045.4     Thyroid Stimulating Immunoglob      <=1.3 TSI index <1.0     Thyrotropin Receptor Ab      0.00 - 1.75 IU/L <1.10     Thyroid Peroxidase Ab      <=60 U/mL 48        (H) High  (L) Low    TSH (uIU/mL)   Date Value   05/14/2021 2.073   02/27/2021 3.748   01/21/2021 7.668 (H)   08/29/2020 4.872 (H)   05/07/2020 5.280 (H)   03/15/2019 2.785     T3, Free (pg/mL)   Date Value   08/29/2020 2.82     Free T4 (ng/dL)   Date Value   09/81/1914 0.86 (L)   02/27/2021 0.86 (L)   01/21/2021 0.98   08/29/2020 0.97   03/15/2019 0.85     Thyroid Peroxidase Ab (U/mL)   Date Value   01/21/2021 48     Thyroid Stimulating Immunoglob (TSI index)   Date Value   01/21/2021 <1.0     Labs from 2015 TSH < 0.02,  2016 at [facility] showed positive Tg Ab and TPO ab     Radiology reviewed:  ([facility]) Neck ultrasound in 01/2015 showed heterogenous appearing thyroid without nodules.

## 2021-08-19 NOTE — Unmapped (Signed)
It was a pleasure seeing you today at our [facility] clinic.   If any testing was ordered today, most of the time the results will appear in your MyChart account even before I see them. Once all your results are available, I will contact you within 1-2 weeks with an explanation. Please contact me if you do not hear from me by then.   Please make sure you have access to MyChart since this is how I will communicate with you.  If you do not have MyChart, I will send you a letter which may take 1-2 weeks to arrive.      Remember to call us with any urgent issues. Please allow a few days for mychart messages to be read and answered.     If you are seen for diabetes, please bring your glucometer and all devices to every visit.   All refill requests must be submitted 14 days in advance to allow enough time to be processed.

## 2021-08-20 NOTE — Unmapped (Signed)
Upcoming Appt:  Future Appointments   Date Time Provider Department Center   08/22/2021 11:00 AM Julieanne Cotton [place], DO [facility]   09/09/2021 10:15 AM ADULT ONC LAB [facility]   09/09/2021 11:20 AM Halford Dec[place], MD [id number] [facility]   12/24/2021 10:10 AM Lindalou Hose Dulcy Fanny, MD [facility]END[facility]   03/13/2022  1:20 PM [facility]MO RM 1 [facility] [place] [facility]       Recent:   What is the date of your last related visit? 7/11 [facility]  Related acute medications Rx'd: No  Home treatment tried:  9:24 a.m. Tylenol 1000 mg., inially ice pack      Relevant:   Allergies: Patient has no known allergies.  Medications: ruxolitinib, citalopram, atorvastatin, omeperazole,   Health History: Platelet disorder, Vertigo  Weight: n/a      [place] Cancer patients only:  What was the date of your last cancer treatment (mm/dd/yy)?: n/a  Was the treatment oral or infusion?: n/a  Are you currently on TVEC (yes/no)?: n/a  Reason for Disposition   Last tetanus shot >10 years ago and CLEAN cut or scrape    Answer Assessment - Initial Assessment Questions  1. MECHANISM: How did the injury happen? For falls, ask: What height did you fall from? and What surface did you fall against?      Larey Seat on the driveway, believes that she tripped but not sure. Landed on blacktop. 3-4 Falls within the past month, no loss of consciousness.  2. ONSET: When did the injury happen? (Minutes or hours ago)       7/11 afternoon  3. NEUROLOGIC SYMPTOMS: Was there any loss of consciousness? Are there any other neurological symptoms?       No  4. MENTAL STATUS: Does the person know who he is, who you are, and where he is?       Yes  5. LOCATION: What part of the head was hit?       Above left eye, left side  6. SCALP APPEARANCE: What does the scalp look like? Is it bleeding now? If Yes, ask: Is it difficult to stop?       Dressing to face, treated at Urgent Care 7/11  7. SIZE: For cuts, bruises, or swelling, ask: How large is it? (e.g., inches or centimeters)       About an inch was bleeding initially by the time she was seen skin margins were intact an bleeding had stopped.  8. PAIN: Is there any pain? If Yes, ask: How bad is it?  (e.g., Scale 1-10; or mild, moderate, severe)      Face pain-tender to touch 4/10, left hand 4/10 at rest, 6/10 with movement  9. TETANUS: For any breaks in the skin, ask: When was the last tetanus booster?      Up to date  10. OTHER SYMPTOMS: Do you have any other symptoms? (e.g., neck pain, vomiting)        Abrasions left knee. She denies other symptoms  11. PREGNANCY: Is there any chance you are pregnant? When was your last menstrual period?        N/A    Protocols used: Head Injury-A-[place]

## 2021-08-22 ENCOUNTER — Ambulatory Visit: Admit: 2021-08-22 | Discharge: 2021-08-23 | Payer: MEDICARE

## 2021-08-22 DIAGNOSIS — S6292XD Unspecified fracture of left wrist and hand, subsequent encounter for fracture with routine healing: Principal | ICD-10-CM

## 2021-08-22 LAB — COMPREHENSIVE METABOLIC PANEL
ALBUMIN: 4 g/dL (ref 3.4–5.0)
ALKALINE PHOSPHATASE: 81 U/L (ref 46–116)
ALT (SGPT): 24 U/L (ref 10–49)
ANION GAP: 5 mmol/L (ref 5–14)
AST (SGOT): 29 U/L (ref <=34)
BILIRUBIN TOTAL: 0.4 mg/dL (ref 0.3–1.2)
BLOOD UREA NITROGEN: 21 mg/dL (ref 9–23)
BUN / CREAT RATIO: 27
CALCIUM: 9.4 mg/dL (ref 8.7–10.4)
CHLORIDE: 108 mmol/L — ABNORMAL HIGH (ref 98–107)
CO2: 28 mmol/L (ref 20.0–31.0)
CREATININE: 0.77 mg/dL
EGFR CKD-EPI (2021) FEMALE: 85 mL/min/{1.73_m2} (ref >=60)
GLUCOSE RANDOM: 92 mg/dL (ref 70–179)
POTASSIUM: 4 mmol/L (ref 3.4–4.8)
PROTEIN TOTAL: 7.3 g/dL (ref 5.7–8.2)
SODIUM: 141 mmol/L (ref 135–145)

## 2021-08-22 LAB — CBC
HEMATOCRIT: 33.7 % — ABNORMAL LOW (ref 34.0–44.0)
HEMOGLOBIN: 11.6 g/dL (ref 11.3–14.9)
MEAN CORPUSCULAR HEMOGLOBIN CONC: 34.5 g/dL (ref 32.0–36.0)
MEAN CORPUSCULAR HEMOGLOBIN: 31.9 pg (ref 25.9–32.4)
MEAN CORPUSCULAR VOLUME: 92.5 fL (ref 77.6–95.7)
MEAN PLATELET VOLUME: 8.2 fL (ref 6.8–10.7)
PLATELET COUNT: 546 10*9/L — ABNORMAL HIGH (ref 150–450)
RED BLOOD CELL COUNT: 3.64 10*12/L — ABNORMAL LOW (ref 3.95–5.13)
RED CELL DISTRIBUTION WIDTH: 15.1 % (ref 12.2–15.2)
WBC ADJUSTED: 5.5 10*9/L (ref 3.6–11.2)

## 2021-08-22 LAB — TSH: THYROID STIMULATING HORMONE: 2.439 u[IU]/mL (ref 0.550–4.780)

## 2021-08-22 NOTE — Unmapped (Signed)
Condition is ongoing  Differential includes syncope, CNS  Medication and treatment plan as described in orders or med list.  Counseled on etiology, treatment, warning signs.  Patient education handout provided.

## 2021-08-22 NOTE — Unmapped (Signed)
Condition is ongoing  Medication and treatment plan as described in orders or med list.  Counseled on etiology, treatment, warning signs.  Patient education handout provided.

## 2021-08-22 NOTE — Unmapped (Signed)
Condition is ongoing  Medication and treatment plan as described in orders or med list.  Counseled on etiology, treatment, warning signs.  Patient education handout provided.

## 2021-08-22 NOTE — Unmapped (Signed)
Condition is ongoing

## 2021-08-22 NOTE — Unmapped (Signed)
Condition is improved  Medication and treatment plan as described in orders or med list.  Counseled on etiology, treatment, warning signs.  Patient education handout provided.

## 2021-08-22 NOTE — Unmapped (Signed)
ASSESSMENT/PLAN    Problem List Items Addressed This Visit          Other    Depression, major, single episode, complete remission (CMS-HCC) (Chronic)     Condition is ongoing           Relevant Medications    citalopram (CELEXA) 10 MG tablet    Recurrent falls - Primary     Condition is ongoing  Differential includes syncope, CNS  Medication and treatment plan as described in orders or med list.  Counseled on etiology, treatment, warning signs.  Patient education handout provided.           Relevant Orders    TSH    Comprehensive Metabolic Panel    MRI brain with and without contrast    Physical Therapy    Facial laceration     Condition is improved  Medication and treatment plan as described in orders or med list.  Counseled on etiology, treatment, warning signs.  Patient education handout provided.           Contusion of left wrist     Condition is ongoing  Medication and treatment plan as described in orders or med list.  Counseled on etiology, treatment, warning signs.  Patient education handout provided.           Loss of consciousness (CMS-HCC)     Condition is ongoing  Medication and treatment plan as described in orders or med list.  Counseled on etiology, treatment, warning signs.  Patient education handout provided.             Relevant Orders    CBC    TSH    Comprehensive Metabolic Panel    MRI brain with and without contrast       I personally spent 30 minutes face-to-face and non-face-to-face in the care of this patient, which includes all pre, intra, and post visit time on the date of service.    Patient education handout provided.      Follow-up visits include:   Future Appointments   Date Time Provider Department Center   09/09/2021 10:15 AM ADULT ONC LAB [facility]   09/09/2021 11:20 AM Halford Decamp, MD [id number]   12/24/2021 10:10 AM Lindalou Hose Dulcy Fanny, MD [facility]   03/13/2022  1:20 PM [facility] RM 1 [facility] [place] [facility] SUBJECTIVE    Subjective   This is a 66 y.o. female who presents with had concerns including Fall (Fell Monday,hit head, injured left wrist, left knee. Went UC. 4th fall in a month. X-ray done on wrist but not resulted yet), Memory Loss, Dizziness (Only 1 episode other than many years ago), and Tinnitus (Couple months).     Vertigo and Falls  Patient presents for evaluation of dizziness and recurrent falls. The symptoms started several weeks ago and have gradually worsened. The attacks occur infrequently and last a few minutes. She had recent episode with loss of consciousness that resulted in L eyebrow laceration and wrist trauma.  She was seen in Urgent Care and placed in splint.    Wound Check  Patient presents for wound check. Patient has a laceration wound which is located on the  L eyebrow . Current symptoms: wound healing as expected.     Wrist Pain  Patient complains of left wrist pain after recent fall.     REVIEW OF SYSTEMS    Past medical history, medications, social history, and allergies reviewed and  updated.  Remainder of 10 of 13 systems review negative except +cognitive impairment.         Objective     OBJECTIVE  BP 126/71  - Pulse 77  - Temp 36.2 ??C (97.1 ??F) (Temporal)  - Wt 56.8 kg (125 lb 3.2 oz)  - BMI 21.48 kg/m??    Physical Examination  Vital Signs- reviewed  General appearance - alert, well appearing, and in no distress  Mental status - alert, oriented to person, place, and time  Eyes - pupils equal and reactive, extraocular eye movements intact  Ears - bilateral TM's and external ear canals normal  Nose - normal and patent, no erythema, discharge or polyps  Mouth - mucous membranes moist  Neck - supple, no significant adenopathy  Chest - clear to auscultation, no wheezes, rales or rhonchi, symmetric air entry  Heart - normal rate, regular rhythm without murmurs, rubs, clicks or gallops  Abdomen - soft, nontender, nondistended, no masses or organomegaly  Neurological - alert, oriented, normal speech, slowed finger to nose  Extremities - no pedal edema, no clubbing or cyanosis  Skin - normal coloration and turgor, no rashes, no suspicious skin lesions noted  Musculoskeletal- full ROM in L wrist; +ecymoses to radial aspect; no point tenderness, strength testing  was equal/bilateral      Labs, Imaging, and Other Clinical Data:  I have reviewed the labs, imaging studies, and other clinical data associated with this encounter.  See Epic Labs and Imaging section for details.

## 2021-08-25 DIAGNOSIS — R296 Repeated falls: Principal | ICD-10-CM

## 2021-08-25 MED ORDER — MECLIZINE 12.5 MG TABLET
ORAL_TABLET | Freq: Three times a day (TID) | ORAL | 1 refills | 10 days | Status: CP | PRN
Start: 2021-08-25 — End: 2022-08-25

## 2021-09-08 ENCOUNTER — Ambulatory Visit: Admit: 2021-09-08 | Discharge: 2021-09-09 | Payer: MEDICARE

## 2021-09-08 DIAGNOSIS — D471 Chronic myeloproliferative disease: Principal | ICD-10-CM

## 2021-09-08 MED ADMIN — gadobenate dimeglumine (MULTIHANCE) 529 mg/mL (0.1mmol/0.2mL) solution 10 mL: 10 mL | INTRAVENOUS | @ 23:00:00 | Stop: 2021-09-08

## 2021-09-09 ENCOUNTER — Other Ambulatory Visit: Admit: 2021-09-09 | Discharge: 2021-09-10 | Payer: MEDICARE

## 2021-09-09 ENCOUNTER — Ambulatory Visit
Admit: 2021-09-09 | Discharge: 2021-09-10 | Payer: MEDICARE | Attending: Hematology & Oncology | Primary: Hematology & Oncology

## 2021-09-09 DIAGNOSIS — D473 Essential (hemorrhagic) thrombocythemia: Principal | ICD-10-CM

## 2021-09-09 DIAGNOSIS — D471 Chronic myeloproliferative disease: Principal | ICD-10-CM

## 2021-09-09 LAB — COMPREHENSIVE METABOLIC PANEL
ALBUMIN: 3.6 g/dL (ref 3.4–5.0)
ALKALINE PHOSPHATASE: 78 U/L (ref 46–116)
ALT (SGPT): 19 U/L (ref 10–49)
ANION GAP: 8 mmol/L (ref 5–14)
AST (SGOT): 24 U/L (ref <=34)
BILIRUBIN TOTAL: 0.4 mg/dL (ref 0.3–1.2)
BLOOD UREA NITROGEN: 19 mg/dL (ref 9–23)
BUN / CREAT RATIO: 20
CALCIUM: 8.7 mg/dL (ref 8.7–10.4)
CHLORIDE: 109 mmol/L — ABNORMAL HIGH (ref 98–107)
CO2: 26 mmol/L (ref 20.0–31.0)
CREATININE: 0.93 mg/dL — ABNORMAL HIGH
EGFR CKD-EPI (2021) FEMALE: 68 mL/min/{1.73_m2} (ref >=60)
GLUCOSE RANDOM: 97 mg/dL (ref 70–179)
POTASSIUM: 4.2 mmol/L (ref 3.4–4.8)
PROTEIN TOTAL: 6.6 g/dL (ref 5.7–8.2)
SODIUM: 143 mmol/L (ref 135–145)

## 2021-09-09 LAB — CBC W/ AUTO DIFF
BASOPHILS ABSOLUTE COUNT: 0 10*9/L (ref 0.0–0.1)
BASOPHILS RELATIVE PERCENT: 0.4 %
EOSINOPHILS ABSOLUTE COUNT: 0.1 10*9/L (ref 0.0–0.5)
EOSINOPHILS RELATIVE PERCENT: 1.3 %
HEMATOCRIT: 31.2 % — ABNORMAL LOW (ref 34.0–44.0)
HEMOGLOBIN: 10.7 g/dL — ABNORMAL LOW (ref 11.3–14.9)
LYMPHOCYTES ABSOLUTE COUNT: 1.7 10*9/L (ref 1.1–3.6)
LYMPHOCYTES RELATIVE PERCENT: 32.3 %
MEAN CORPUSCULAR HEMOGLOBIN CONC: 34.2 g/dL (ref 32.0–36.0)
MEAN CORPUSCULAR HEMOGLOBIN: 31.5 pg (ref 25.9–32.4)
MEAN CORPUSCULAR VOLUME: 91.9 fL (ref 77.6–95.7)
MEAN PLATELET VOLUME: 8.1 fL (ref 6.8–10.7)
MONOCYTES ABSOLUTE COUNT: 0.5 10*9/L (ref 0.3–0.8)
MONOCYTES RELATIVE PERCENT: 10.1 %
NEUTROPHILS ABSOLUTE COUNT: 3 10*9/L (ref 1.8–7.8)
NEUTROPHILS RELATIVE PERCENT: 55.9 %
PLATELET COUNT: 495 10*9/L — ABNORMAL HIGH (ref 150–450)
RED BLOOD CELL COUNT: 3.4 10*12/L — ABNORMAL LOW (ref 3.95–5.13)
RED CELL DISTRIBUTION WIDTH: 14.9 % (ref 12.2–15.2)
WBC ADJUSTED: 5.4 10*9/L (ref 3.6–11.2)

## 2021-09-09 LAB — LACTATE DEHYDROGENASE: LACTATE DEHYDROGENASE: 299 U/L — ABNORMAL HIGH (ref 120–246)

## 2021-09-09 MED ORDER — RUXOLITINIB 10 MG TABLET
ORAL_TABLET | Freq: Two times a day (BID) | ORAL | 11 refills | 30 days | Status: CP
Start: 2021-09-09 — End: ?
  Filled 2021-09-17: qty 60, 30d supply, fill #0

## 2021-09-09 NOTE — Unmapped (Addendum)
PLAN:  1) Get thee to a neurologist  2) Continue Jakafi at 10 mg BID   3) I will see you in 6 months      -Scattered periventricular and deep white matter T2 hyperintensities, which is nonspecific but can be seen in chronic small vessel ischemic disease.       -Diffuse global cerebral atrophy with resultant dilation of ventricles. The ventricles are somewhat greater in size than would be expected for the degree of atrophy. While this finding is indeterminate, it could be seen in normal pressure hydrocephalus. Recommend correlation with clinical findings.     You need a neurologist.  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     All lab results last 24 hours:    Recent Results (from the past 24 hour(s))   Lactate dehydrogenase    Collection Time: 09/09/21 10:40 AM   Result Value Ref Range    LDH 299 (H) 120 - 246 U/L   Comprehensive Metabolic Panel    Collection Time: 09/09/21 10:40 AM   Result Value Ref Range    Sodium 143 135 - 145 mmol/L    Potassium 4.2 3.4 - 4.8 mmol/L    Chloride 109 (H) 98 - 107 mmol/L    CO2 26.0 20.0 - 31.0 mmol/L    Anion Gap 8 5 - 14 mmol/L    BUN 19 9 - 23 mg/dL    Creatinine 1.61 (H) 0.60 - 0.80 mg/dL    BUN/Creatinine Ratio 20     eGFR CKD-EPI (2021) Female 68 >=60 mL/min/1.25m2    Glucose 97 70 - 179 mg/dL    Calcium 8.7 8.7 - 09.6 mg/dL    Albumin 3.6 3.4 - 5.0 g/dL    Total Protein 6.6 5.7 - 8.2 g/dL    Total Bilirubin 0.4 0.3 - 1.2 mg/dL    AST 24 <=04 U/L    ALT 19 10 - 49 U/L    Alkaline Phosphatase 78 46 - 116 U/L   CBC w/ Differential    Collection Time: 09/09/21 10:40 AM   Result Value Ref Range    WBC 5.4 3.6 - 11.2 10*9/L    RBC 3.40 (L) 3.95 - 5.13 10*12/L    HGB 10.7 (L) 11.3 - 14.9 g/dL    HCT 54.0 (L) 98.1 - 44.0 %    MCV 91.9 77.6 - 95.7 fL    MCH 31.5 25.9 - 32.4 pg    MCHC 34.2 32.0 - 36.0 g/dL    RDW 19.1 47.8 - 29.5 %    MPV 8.1 6.8 - 10.7 fL    Platelet 495 (H) 150 - 450 10*9/L    Neutrophils % 55.9 %    Lymphocytes % 32.3 %    Monocytes % 10.1 %    Eosinophils % 1.3 %    Basophils % 0.4 %    Absolute Neutrophils 3.0 1.8 - 7.8 10*9/L    Absolute Lymphocytes 1.7 1.1 - 3.6 10*9/L    Absolute Monocytes 0.5 0.3 - 0.8 10*9/L    Absolute Eosinophils 0.1 0.0 - 0.5 10*9/L    Absolute Basophils 0.0 0.0 - 0.1 10*9/L      Jakafi is dosed 10 mg to 25 mg twice a day. The higher doses will give you more anemia.  We like < 650 for platelets.     Bleach Baths: 1/2 to 1 cup in a tepid bath water. Soak for 20 min and dab dry.

## 2021-09-09 NOTE — Unmapped (Signed)
Patient stated that she had about 4 falls since she was last here. One fall she broke her wrist , and she fell on the pavement. She have been dizzy for the past 6 weeks off and on. She had a MRI done last night that was ordered by her primary care provider because she was falling to often.

## 2021-09-09 NOTE — Unmapped (Signed)
ID: Christy Buchanan is a 66 y.o. with a JAK2+ MPN    DZ CHAR: ET (JAK2+)  ?? BM Bx (2018):  ??? Increased atypical megakaryocytes with clustering  ??? 2% blasts  ??? JAK2 mutation (VAF 3.5%)  ??? Nl karyotype   ??? Cellularity from 40 - 50%  ??? Patchy mild increase in reticulin; no increase in collagen; no fibrosis  ?? No monoclonal B or T cell populations  ?? IPSET 2: High risk (age, JAK2)   ?? Tx:   ?? Hydrea (DC'd due to inc MPN Sx  ?? Ruxolitnib    ASSESSMENT:   Christy Buchanan is a 66 y.o. 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  At the time of her diagnosis, she had no circulating immature or CD34+ cells, no increase in fibrosis, a normal LDH, and no palpable splenomegaly.      She was transitioned from hydroxyurea to ruxolitinib due to increased MPN related sx.  This change reduced her MPN symptom score initially from 48 to 21.  Since then, her score has continued rise though it is not clear if this is due to her MPN.  At her current dose, her platelets are well controlled.     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  Her clinical sx and MRI are c/w this diagnosis.  She has an appt with a neurologist soon.      PLAN:  1) Increase ruxolitnib to 10 mg BID.   2) Continue ASA 81mg  PO daily  3) Return in 6 months  ?? Lipid check  ?? CD34 check  ?? CBC, CMP, LDH.     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 CBC: 7.3/12.0/312  ?? LDH: WNL  ?? CD34: 0.02  ?? VWF: Normal   ?? MPN score: 48 to 49  ?? Began ruxolitinib 10 mg BID    04/05/19  ?? CBC: 7.3/12.0/312  ?? MPN score: 36 - 38  ?? Ruxolitinib 10 mg BID; Hydrea was stopped     05/17/19: Ruxolitinib 20 mg BID  ?? LDH: 1.30 x ULN  ?? MPN Score: 27    05/31/19: Ruxolitinib 15 mg BID (decrease due to anemia of 10.6)    07/12/19: Ruxolitinib 10 mg BID (decrease due to anemia of 10.4, plts 477); added Hydrea 500 mg a day   ?? CD34: 0.12  ?? LDH: 1.42 x ULN  ?? MPN Score: 32    10/25/19: Ruxolitinib 5 mg BID (continued anemia); Hydrea at 500 mg   ?? LDH: 1.29 x ULN  ?? MPN Score: 31    03/01/19:  Ruxolitinib 5 mg BID; Hydrea at 500 mg   ?? LDH: 1.39 x ULN  ?? MPN Score: 23    05/09/20:  Ruxolitinib 5mg  qAM and 10mg  qPM;  Stopped hydrea    06/12/20: Ruxolitinib 5mg  qAM and 10mg  qPM;  ?? LDH 1.39 x ULN  ?? MPN score: 34 but reports feeling better than last visit even with higher score.  08/29/20: 4.8/11.0/431  02/27/21: 6.2/11.4/477; Ruxolitinib 10 mg BID  ?? MPN: 21  ?? Xol: 159  ?? LDH: 1.17 x ULN  09/09/21: 5.4/10.7/495    INTERVAL HX  Christy Buchanan comes for FU of her ET  ??? She has had 4 falls  o She notes problems with gait; her falls have been due to difficulties missteps   o She notes episodes of confusion  o She denies incontinence  o Her MRI is c/w NPH  ??? She reports full adherence; she had taken extra doses from time to time.   ??? The remainder of her ROS is given below.     MPN 10 Score  Symptom (Absent 0 to 10 worst imaginable) 2/21 2/21 4/21 6/21 9/21  1/22  5/22 1/23 8/23   Fatigue in the past 24 hours 6 9 7 9 7  8 6  5 5    Filling up quickly when you eat (Early satiety)  7 7 5 5 5 5 4   0 5   Abdominal discomfort  3 4 4 4 4 5  4  2   0   Inactivity 5 5 - 6 5 7 5 4  4  4 2    Problems with concentration - 6 7-8 6 7 6 2  5  3 6    Numbness/ Tingling (in my hands and feet) 5 to 6 0 0 0 0 0  0  0 0   Night sweats 0 0 1 0 0 0  1 0 4   Itching (pruritus) 10 7 0 0 3 3   7 7 7    Bone pain (diffuse not joint pain or arthritis) 6 6 3  0 1 0  3  0 2 -3   Fever (>100 F) 0 0 0 0 0 0  0  0 0   Unintentional weight loss last 6 months 0 0 0 0 0 0  0  0 1 - 2   TOTAL 48 - 49 36 - 38 27 32 31 27   34 21 32 - 34 PHYSICAL EXAM:  VS: As recorded in Epic  GENERAL: She appears well   HEENT: No trauma; OP is clear  LYMPH NODES: No LAN  LUNGS: CTA  COR:RRR; no m/r/g  ABD: NTND; no HSM  EXT: No edema  NEURO: Slightly wide based; slow gait    PMHx  ??? 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 Sjogren's syndrome  ??? Autoimmune thyroiditis - Txed with methimazole   ??? GERD - Txed with PPI  ??? Mixed hyperlipidemia - Tx with Lovaza, pravastatin     FHx:  ?? Daughter w/ spina bifida, BRCA 1081G>A mutation (w/o cancer)      2016 WHO Diagnostic Criteria for MPNs  ET  Major Criteria  ??? Plts > 450  ??? BM biopsy showing proliferation mainly of the megakaryocyte lineage with increased numbers of enlarged, mature megakaryocytes with hyperlobulated nuclei. No significant left-shift of neutrophil granulopoiesis or erythropoiesis and very rarely minor (grade 1) increase in reticulin fibers  ??? Not meeting WHO criteria for BCR-ABL1?+?CML, PV, PMF, MDS, or other myeloid neoplasms)  ??? Presence of JAK2, CALR or MPL mutation    Minor Criteria  ??? Presence of a clonal marker (e.g., abnormal karyotype)  ???  Absence of evidence for reactive thrombocytosis    Diagnosis: All major criteria or first three major and one minor     PMF  Major criteria  ??? Proliferation and atypia of megakaryocytes accompanied by either reticulin and/or collagen fibrosis  grades 2 or 3 on a scale of 0 to 3  ??? Not meeting WHO criteria for ET, PV, BCR-ABL1+ CML, myelodysplastic syndromes, or other myeloid neoplasm  ??? Presence of JAK2, CALR or MPL mutation or in the absence of these mutations, presence of another clonal marker or absence of reactive myelofibrosis    Minor Criteria  ??? Anemia not attributed to another comorbid condition  ??? WBC > 11.0  ??? Palpable Splenomegaly   ??? LDH > ULN  ??? Leukoerythroblastosis     Diagnosis: 3 major and at least 1 minor confirmed in two consecutive determinations    IPSET Model for Prediction of Thrombosis    Age:    2   < 60: 0   > 60: 2  WBC count (x10e9)  0   < 11: 0   > 11: 1  Hx of Thrombosis  0   No: 0   Yes: 1  --------------------------------------------  Total:    2     10 yr thrombosis risk, median survival (yrs)  Lo Risk: 0 pts    --> 89%, NR  Int Risk: 1-2 pts --> 86%, 24.5  Hi Risk: 3-4 pts  --> 69%, 14.7    IPSET II  Age:    1   < 60: 0   > 60: 1  CVS Risk Factor*  0   No: 0   Yes: 1   Hx of Thrombosis  0   No: 0   Yes: 2  JAK2     2   No: 0   Yes: 2  --------------------------------------------  Total:    3    *Gave up smoking 10 years ago, near optimal non-LDL on treatment, non HTN    Annual Thrombosis Risk  Lo Risk: 0-1 pts  --> 1.03%  Int Risk: 2 pts     --> 2.35%  Hi Risk: 3-6 pts  --> 3.56%     NOTE  ?? JAK2 conveys a higher risk of thrombosis  ?? CALR conveys a higher risk of bleeding    Newer Model:  [email or web address]    Primary prophylaxis of thrombosis  All patients: Aspirin unless   ??? VWF activity <30%,  ??? Platelet >?1 million,  ??? CALR-mutated low-risk ET    PV with Hct >?45%: Add phlebotomy/cytoreduction to target Hct <?45%  Age >?60 years and/or prior history of thrombosis: Add cytoreduction  Secondary prophylaxis after thrombotic event  All patients: Cytoreduction  Typical VTE: Consider indefinite VKA for most patients; aspirin if not on VKA  Atypical VTE: Indefinite VKA  Arterial thrombosis: Aspirin

## 2021-09-15 NOTE — Unmapped (Signed)
Christy Buchanan    Specialty Medication(s) to be Shipped:   Hematology/Oncology: Christy Buchanan    Other medication(s) to be shipped: No additional medications requested for fill at this time     Christy Buchanan, DOB: 02/04/1956  Phone: [phone] (home)       All above HIPAA information was verified with patient.     Was a Nurse, learning disability used for this call? No    Completed refill call assessment today to schedule patient's medication shipment from the [facility] [phone]).  All relevant notes have been reviewed.     Specialty medication(s) and dose(s) confirmed: Regimen is correct and unchanged.   Changes to medications: [name] reports no changes at this time.  Changes to insurance: No  New side effects reported not previously addressed with a pharmacist or physician: None reported  Questions for the pharmacist: No    Confirmed patient received a Conservation officer, historic buildings and a Surveyor, mining with first shipment. The patient will receive a drug information handout for each medication shipped and additional FDA Medication Guides as required.       DISEASE/MEDICATION-SPECIFIC INFORMATION        N/A    SPECIALTY MEDICATION ADHERENCE     Medication Adherence    Patient reported X missed doses in the last month: 0  Specialty Medication: Jakafi 10 mg  Patient is on additional specialty medications: No  Patient is on more than two specialty medications: No  Any gaps in refill history greater than 2 weeks in the last 3 months: no  Demonstrates understanding of importance of adherence: yes  Informant: patient  Reliability of informant: reliable  Provider-estimated medication adherence level: good  Patient is at risk for Non-Adherence: No  Reasons for non-adherence: no problems identified                  Confirmed plan for next specialty medication refill: delivery by pharmacy  Refills needed for supportive medications: not needed          Refill Coordination    Has the Patients' Contact Information Changed: No  Is the Shipping Address Different: No         Were doses missed due to medication being on hold? No    jakafi 10 mg: 7 days of medicine on hand       REFERRAL TO PHARMACIST     Referral to the pharmacist: Not needed      [facility]     Shipping address confirmed in Epic.     Delivery Scheduled: Yes, Expected medication delivery date: 08/09.     Medication will be delivered via Same Day Courier to the prescription address in Epic WAM.    Antonietta Barcelona   [facility] Specialty Technician

## 2021-09-18 ENCOUNTER — Ambulatory Visit: Admit: 2021-09-18 | Discharge: 2021-09-19 | Payer: MEDICARE

## 2021-09-18 NOTE — Unmapped (Signed)
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  Counseled on etiology, treatment, warning signs.  Patient education handout provided.             Relevant Orders    Ambulatory referral to Neurosurgery       Other    Sjogrens syndrome (CMS-HCC) (Chronic)     Condition is ongoing  Medication and treatment plan as described in orders or med list.  Counseled on etiology, treatment, warning signs.  Patient education handout provided.             MPN (myeloproliferative neoplasm) (CMS-HCC) (Chronic)     Condition is ongoing  Medication and treatment plan as described in orders or med list.  Counseled on etiology, treatment, warning signs.  Patient education handout provided.              Follow-up visits include:   Future Appointments   Date Time Provider Department Center   10/03/2021 11:45 AM Melvern Banker [place], PT [facility]   12/24/2021 10:10 AM Fredricka Bonine, MD [facility]   03/13/2022  1:20 PM [facility] M[facility]MO RM 1 [facility] [place] [facility]              SUBJECTIVE    Subjective   This is a 66 y.o. female who presents with had concerns including Fall.     Recurrent Falls  Patient presents for evaluation of recurrent falls. The symptoms started several weeks ago and have gradually worsened and she describes falling to L side. The attacks occur infrequently and last a few minutes.     REVIEW OF SYSTEMS    Past medical history, medications, social history, and allergies reviewed and updated.  Remainder of systems review negative except recent MRI suggestive of NPH; recently saw hematology-condition stable         Objective     OBJECTIVE  BP 139/66 (BP Site: R Arm, BP Position: Sitting, BP Cuff Size: Medium)  - Pulse 74  - Temp 36.6 ??C (97.8 ??F) (Temporal)  - Ht 162.6 cm (5' 4)  - Wt 57.2 kg (126 lb)  - BMI 21.63 kg/m??    Physical Examination  Vital Signs- reviewed  General appearance - alert, well appearing, and in no distress  Mental status - alert, oriented to person, place, and time  Neurological - alert, oriented, normal speech, no focal findings or movement disorder noted      Labs, Imaging, and Other Clinical Data:  I have reviewed the labs, imaging studies, and other clinical data associated with this encounter.  See Epic Labs and Imaging section for details.

## 2021-09-18 NOTE — Unmapped (Signed)
Condition is ongoing  Medication and treatment plan as described in orders or med list.  Counseled on etiology, treatment, warning signs.  Patient education handout provided.

## 2021-10-03 ENCOUNTER — Ambulatory Visit
Admit: 2021-10-03 | Discharge: 2021-10-04 | Payer: MEDICARE | Attending: Rehabilitative and Restorative Service Providers" | Primary: Rehabilitative and Restorative Service Providers"

## 2021-10-07 NOTE — Unmapped (Signed)
[facility] HEALTH SCIENCES PT [facility]  OUTPATIENT PHYSICAL THERAPY  10/03/2021  Note Type: Evaluation       Patient Name: Christy Buchanan  Date of Birth:1955/04/26  Diagnosis:   Encounter Diagnosis   Name Primary?   ??? Recurrent falls Yes     Referring MD:  Christy Buchanan,*     Visit #: 1    Date of Onset of Impairment-No date available  Date PT Care Plan Established or Reviewed-No date available  Date PT Treatment Started-No date available   Plan of Care Effective Date:          Assessment/Plan:    Assessment  Assessment details:    66 y.o. year old female presents with a 1 year history of recurrent falls which is limiting their ability to perform the functional activities listed below.  Based on patient's presentation in clinic today, signs and symptoms appear including normal LE ROM, normal LE strength, limited static balance, and limited dynamic to be consistent with normal pressure hyrdocephalus and an increased risk for falling.  This patient requires skilled physical therapy services to address the outlined impairments in order to return to their desired level of function.          Impairments: pain, joint restriction, impaired flexibility, decreased strength, decreased range of motion, gait deviation, impaired ADLs and poor awareness of body mechanics      Personal Factors/Comorbidities: 1-2      Examination of Body Systems: activity/participation, musculoskeletal and neurological    Clinical Presentation: evolving    Clinical Decision Making: moderate    Prognosis: good prognosis    Positive Prognosis Rationale: motivated for treatment.      Therapy Goals      Goals:      Goals:??    Long-Term Goals   In 12 weeks from 10/03/21  1. The pt will demonstrate independent performance of HEP to maintain functional gains.   2. Pt will demonstrated TUG time less than 12 seconds to demonstrated decreased risk for falling.   3. The patient will improve in 5x5 sit to stand time to less than 11 seconds to help patient demonstrate improvement in overall function.      Plan    Therapy options: will be seen for skilled physical therapy services    Planned therapy interventions: manual therapy, body mechanics training, therapeutic activities, therapeutic exercises, postural training, neuromuscular re-education, education - patient and home exercise program      Frequency: 3x month    Duration in weeks: 12    Education provided to: patient.    Education provided: anatomy, body mechanics, importance of Therapy and HEP    Education results: needs reinforcement and needs further instruction.    Communication/Consultation: Medicare Cert/POC sent to Referring Provider.    Next visit plan:        Continue dynamic and static balance     Total Session Time: 45    Treatment rendered today:      See objective         Subjective:   History of Present Condition      History of Present Condition/Chief Complaint:       Recurrent falls     Date of Onset:  08/19/2021  Subjective:     Christy Buchanan reports to outpatient physical therapy with a 1 year history of recurrent falls. Pt reports that she has fallen 5-6x over the past year and she is not sure why. She reports that on 7/11 she had  a fall in her driveway where she cut her L eye and injured her L wrist. Pt notes that her falls seem to occur suddenly without her tripping. Pt had a neurosurgery consult for Normal pressure hydrocephaly in October. Pt denies any pain currently. Pt would like to reduce the number of falls she is experiencing.     Previous treatment: none  Since onset, symptoms are worsening.     Red Flags: none  Yellow Flags: see EMR  Social History: lives alone, retired       PAST MEDICAL HISTORY -  Past Medical History:  No date: Autoimmune thyroiditis  No date: Breast cancer (CMS-HCC)  No date: GERD (gastroesophageal reflux disease)  No date: Hyperlipidemia  No date: Osteoporosis  No date: Sjogren's disease (CMS-HCC)  No date: Thrombocythemia  No date: Vitamin D deficiency Past Surgical History:  No date: AUGMENTATION MAMMAPLASTY; Bilateral      Comment:  24ish yrs  No date: BREAST BIOPSY; Left      Comment:  malignant  No date: CHEMOTHERAPY  25 ish yrs ago: MASTECTOMY; Left  No date: RADIATION; Left   Patient Active Problem List:     Sleep disorder     Abnormal Pap smear of cervix     Family history of colon cancer     Family history of thyroid cancer     Gastroesophageal reflux disease     History of breast cancer     Mixed hyperlipidemia     Postmenopausal osteoporosis     Pure hypercholesterolemia     Sjogrens syndrome (CMS-HCC)     Vitamin D deficiency     Depression, major, single episode, complete remission (CMS-HCC)     Essential thrombocythemia (CMS-HCC)     Acquired hypothyroidism     JAK-2 gene mutation     Other fatigue     MPN (myeloproliferative neoplasm) (CMS-HCC)     Recurrent falls     Facial laceration     Contusion of left wrist     Loss of consciousness (CMS-HCC)     NPH (normal pressure hydrocephalus) (CMS-HCC)     Current Outpatient Medications:  aspirin (ECOTRIN) 81 MG tablet, Take 1 tablet (81 mg total) by mouth., Disp: , Rfl:   atorvastatin (LIPITOR) 40 MG tablet, Take 1 tablet (40 mg total) by mouth daily., Disp: 30 tablet, Rfl: 11  cetirizine (ZYRTEC) 10 MG tablet, Take 1 tablet (10 mg total) by mouth every other day., Disp: , Rfl:   citalopram (CELEXA) 10 MG tablet, Take 3 tablets (30 mg total) by mouth Three (3) times a day before meals., Disp: 90 tablet, Rfl: 0  meclizine (ANTIVERT) 12.5 mg tablet, Take 1 tablet (12.5 mg total) by mouth Three (3) times a day as needed for dizziness or nausea., Disp: 30 tablet, Rfl: 1  multivitamin (TAB-A-VITE/THERAGRAN) per tablet, Take 1 tablet by mouth daily., Disp: , Rfl:   omega-3 fatty acids-fish oil 300-1,000 mg cap capsule, Take 1 capsule by mouth in the morning., Disp: , Rfl:   omeprazole (PRILOSEC) 20 MG capsule, Take 1 capsule (20 mg total) by mouth daily. TAKE 1 CAPSULE(20 MG) BY MOUTH DAILY, Disp: 90 capsule, Rfl: 2  raloxifene (EVISTA) 60 mg tablet, Take 1 tablet (60 mg total) by mouth daily., Disp: 90 tablet, Rfl: 3  ruxolitinib (JAKAFI) 10 mg tablet, Take 1 tablet (10 mg total) by mouth Two (2) times a day., Disp: 60 tablet, Rfl: 11    No current facility-administered medications for this visit.  Quality of life: good    Pain  No pain reported  Aggravating factors: performance of leg dominant activites, bending, lifting, turning and walking      Precautions and Equipment  Precautions: None  Current Braces/Orthoses: None  Equipment Currently Used: None  Current functional status: limited exercise, limited household activities, limited lifting, limited recreation, leisure activities, limited bending and limited walking tolerance  Social Support  Lives in: [place]  Lives with: alone  Hand dominance: right  Communication Preference: verbal, written and visual  Barriers to Learning: No Barriers  Work/School: retired    Diagnostic Tests  MRI studies: abnormal    Diagnostic Test Comments:       See EMR    Treatments  Current treatment: physical therapy      Patient Goals  Patient goals for therapy: improved ambulation, improved balance and return to recreational activites        Objective:   Objective      Vitals:   BP: 121/80  HR: 75  SPO2:98%    Static Posture & Observation:   Unremarkable    Gait Analysis:  Wide BOS, decreased step/stide length      Reflexes   Right  Left Comments   Brachioradialis (C5-6) WNL WNL    Biceps (C5-6) WNL WNL          Patellar tendon (L3,L4) WNL WNL    Achilles tendon (S1,S2) WNL WNL          Hoffman's  WNL WNL    Babinski      Clonus        Spine Range of Motion/Flexibility:   0-24%   range available 25-49%   range available 50-74% range available  75+%   range available Comments   Flexion x       Extension x       R Rotation x       L Rotation x       R Side bend x       L Side bend x         UE Sensation/Dermatomes:   Motion Right Left   C4 intact intact   C5 intact intact   C6 intact intact   C7 intact intact   C8 intact intact   T1 intact intact     UE Range of Motion/Flexibilty:   Motion Right Left   Shoulder Flexion     Shoulder Extension     Shoulder Abduction     Shoulder ER at side     Shoulder ER at 90      Shoulder IR at 90     Reaching behind head     Reaching behind back     Elbow Flexion     Elbow Extension     Forearm Pronation     Forearm Supination     Wrist Flexion     Wrist Extension      Wrist Radial Deviation     Wrist Ulnar Deviation        UE Strength/Myotomes:   MMT Right Left   Shoulder Shrug (C4)     Shoulder Flexion     Shoulder Extension     Shoulder Abduction (C5)     Shoulder ER at side     Shoulder IR at side     Shoulder ER at 90     Shoulder IR at 90     Elbow Flexion (C6)     Elbow Extension (C7)  Forearm Pronation     Forearm Supination     Wrist Flexion (C7)     Wrist Extension (C6)     Wrist Radial Deviation     Wrist Ulnar Deviatoin        LE Sensation/Dermatomes:   Motion Right Left   L2     L3     L4     L5     S1       LE Range of Motion/Flexibilty: ALL WFL  Motion Right Left   Hip Flexion     Hip Extension     Hip Abduction     Hip IR     Hip ER     Knee Flexion     Knee Extension     Ankle DF     Ankle PF     Ankle Eversion     Ankle Inversion     Great Toe Extension     Great Toe Flexion       LE Strength/Myotomes:   MMT Right Left   Hip Flexion (L2) 5 5   Hip Extension     Hip Abduction 5 5   Hip IR     Hip ER     Knee Flexion     Knee Extension (L3) 5 5   Ankle DF (L4) 5 5   Ankle PF (S1)  5 5   Ankle Eversion (L5) 5 5   Ankle Inversion      Great Toe Extension (L5) 5 5   Great Toe Flexion        Palpation/Segmental Motion/Joint Play:      Functional Mobility:  Transfers:   WFL    Balance:  Static Balance:  Static Balance:  Feet EO, even ground EC, even ground EO, airex   Together 30     Semi-tandem (R in front) 20     Semi-tandem (L in front) 22     Tandem (R in front) 15     Tandem (L in front) 10     SLS (R)      SLS (L) Dynamic Balance:  Timed Up & Go: 23 seconds  10-Meter Walk Test/Gait speed:    5x Sit<>stand: 17 seconds    Eval: 35 min  Therapeutic Exercise: 10 min  Tandem balance x30 sec   Sit to stand 3x8  Semi tandem eyes closed 30 sec    Total time: 45 min                                       I attest that I have reviewed the above information.  Signed: Vertis Kelch, PT  10/03/2021 10:30 AM

## 2021-10-07 NOTE — Unmapped (Signed)
[facility] Refill Coordination Note    Specialty Medication(s) to be Shipped:   Hematology/Oncology: [name]    Other medication(s) to be shipped: No additional medications requested for fill at this time     Christy Buchanan, DOB: Nov 11, 1955  Phone: [phone] (home)       All above HIPAA information was verified with patient.     Was a Nurse, learning disability used for this call? No    Completed refill call assessment today to schedule patient's medication shipment from the [facility] [phone]).  All relevant notes have been reviewed.     Specialty medication(s) and dose(s) confirmed: Regimen is correct and unchanged.   Changes to medications: Christy Buchanan reports no changes at this time.  Changes to insurance: No  New side effects reported not previously addressed with a pharmacist or physician: None reported  Questions for the pharmacist: No    Confirmed patient received a Conservation officer, historic buildings and a Surveyor, mining with first shipment. The patient will receive a drug information handout for each medication shipped and additional FDA Medication Guides as required.       DISEASE/MEDICATION-SPECIFIC INFORMATION        N/A    SPECIALTY MEDICATION ADHERENCE     Medication Adherence    Patient reported X missed doses in the last month: 0  Specialty Medication: Jakafi 10 mg  Patient is on additional specialty medications: No  Informant: patient                       Were doses missed due to medication being on hold? No    Jakafi 10 mg: 14 days of medicine on hand       REFERRAL TO PHARMACIST     Referral to the pharmacist: Not needed      [facility]     Shipping address confirmed in Epic.     Delivery Scheduled: Yes, Expected medication delivery date: 10/17/21.     Medication will be delivered via Next Day Courier to the prescription address in Epic [place].    Christy Buchanan   [facility] Specialty Technician

## 2021-10-14 ENCOUNTER — Ambulatory Visit: Admit: 2021-10-14 | Discharge: 2021-11-12 | Payer: MEDICARE

## 2021-10-14 ENCOUNTER — Ambulatory Visit
Admit: 2021-10-14 | Payer: MEDICARE | Attending: Rehabilitative and Restorative Service Providers" | Primary: Rehabilitative and Restorative Service Providers"

## 2021-10-14 ENCOUNTER — Ambulatory Visit
Admit: 2021-10-14 | Discharge: 2021-11-12 | Payer: MEDICARE | Attending: Rehabilitative and Restorative Service Providers" | Primary: Rehabilitative and Restorative Service Providers"

## 2021-10-15 NOTE — Unmapped (Signed)
[facility] HEALTH SCIENCES PT [facility]  OUTPATIENT PHYSICAL THERAPY  Treatment Note  10/14/2021    Patient Name: Christy Buchanan  Date of Birth:16-Mar-1955  Session Number: 2  Diagnosis:   Encounter Diagnoses   Name Primary?   ??? Recurrent falls Yes   ??? Impaired functional mobility, balance, gait, and endurance    ??? Decreased coordination    ??? Dizziness        Onset of Symptoms: 10/03/20  Date of Evaluation: 10/03/21  Referring Provider: Madelon Lips, DO  Dates of Certification: 10/07/21 - 01/05/22    Chief Complaint/Reason for Referral: recurrent falls  Problem List: Impaired balance, Impaired sensation, Fall Risk, Decreased mobility, Decreased coordination, Cognitive/Behavioral impairments, Dizziness/vertigo    ASSESSMENT:    Christy Buchanan is a 66 year old female with a 1-year history of recurrent falls. 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 She has been evaluated by ortho PT and referred to neuro PT.     On neuro examination today, the pt has mild LE weakness (affecting L hip abductors and B ankle plantarflexors), mild coordination deficits bilaterally (L worse than R), and absent proprioception at the L great toe. She completed the FGA this date with a score of 17 out of 30 and the Activities-specific Balance Confidence (ABC) scale with a score of 39.4%, with both scores supporting previous findings indicating that the pt is at high risk for future falls. The pt has purchased a new SPC which was adjusted to the correct height during today's session. She was instructed in walking with the cane in her R hand with a 2-pt gait pattern. This pt should benefit from skilled PT services to increase muscle strength, address dizziness, improve balance, increase walking capacity and confidence, and decrease risk of falls.           Long Term Goals: (12 weeks)  Long Term Goal 1: The pt will demonstrate independent performance of HEP to maintain functional gains.         Long Term Goal 2: The pt's TUG time will improve to 12 seconds or less to demonstrate decreased risk for falling.         Long Term Goal 3: The pt will improve in 5x5 sit-to-stand time to less than 11 seconds to demonstrate improvement in overall function.         Long Term Goal 4: NEW GOAL (added 10/14/21) Pt's score on the FGA will increase to at least 22/30 to demonstrate meaningful improvement in dynamic balance and decreased risk of falls.         Long Term Goal 5: NEW GOAL (added 10/14/21) Pt's score on the ABC Scale will increase to at least 67% to demonstrate improved balance confidence and decreased risk of falls.                               PLAN:  Other Follow-up / Frequency - Other: 3x/month for Duration: 3 months    Next Visit Plan: complete further vestibular assessment, including modified CTSIB: review HEP; progress balance and gait activities    SUBJECTIVE:  The pt says she feels tired all of the time and just wants to sleep. She reports dizziness and nausea with quick turns and position changes. She says she does not have a sensation of spinning, but feels off- balance. The pt also states that her toes don't feel right, and describes a feeling of having plastic  sheeting over her toes. In addition, the pt reports that reading is nearly impossible for her, as it makes her feel bad, although she can watch TV and Zoom videos with no problems. She has been taking meclizine, with her last dose a couple of days ago.    The pt has not had any falls since last PT visit.    Most recent reported fall: end of July, 2023       Precautions: Cancer history, Osteoporosis, Fall risk       OBJECTIVE:       Pain: No/denies                                     TODAY'S TREATMENT:  Pt arrived to session with new SPC and wearing non-supportive footwear.    Therapist wore KN95/N95 mask throughout the session in the context of the COVID-19 pandemic.     Neuromuscular Re-education: 40 min (3 units)  -F-N-F: pt performed slowly, with mild dysmetria bilaterally; slight intention tremor noted RUE; pt completed 5 reps in 12.9 sec on the L and 11.2 sec on the R  -RAM (forearm pronation/supination): pt has mild dysdiadochokinesia; completed 5 reps in 5.1 sec on the L and 4.9 sec on the R  -H-S (performed in sitting, with heel to knee): mild dysmetria bilaterally; pt completed 5 reps in 7.6 sec on the L and 6.4 sec on the R  -Maintaining balance during dynamic gait activities including walking on level surfaces, walking with changes in gait speed, walking with horizontal & vertical head turns, walking with sudden turn and stop (on command), walking and stepping over an obstacle, walking with eyes closed, tandem walking, walking backward, and negotiating stairs (during administration of FGA); pt's score was 17/30  -ABC Scale: 39.4%     Therapeutic Activities: 15 min (1 unit)  -Therapist adjusted pt's new SPC to the correct height (cane was too tall)  -Pt practiced walking indoor distances (~150') using SPC in R hand; pt needed cueing for 2-pt gait pattern  -Pt education regarding desirable footwear characteristics (pt was wearing non-supportive footwear this date)    FROM INITIAL EVALUATION:  Special Cerebellar Tests: (assessed on 10/14/21)  F-N-F: pt performed slowly, with mild dysmetria bilaterally; slight intention tremor noted RUE; pt completed 5 reps in 12.9 sec on the L and 11.2 sec on the R  RAM (forearm pronation/supination): pt has mild dysdiadochokinesia; completed 5 reps in 5.1 sec on the L and 4.9 sec on the R  H-S (performed in sitting, with heel to knee): mild dysmetria bilaterally; pt completed 5 reps in 7.6 sec on the L and 6.4 sec on the R    Visual/Vestibular Testing: (initially assessed on 10/14/21)  Smooth pursuit: EOMI; WNL  Saccades: WNL  VOR: abnormal - pt states this makes me feel bad  VOR cancellation: NT  Head Shaking Nystagmus: NT  Head Thrust: NT  DVA: NT    Tests for BPPV  Dix-Hallpike: NT  Roll Test: NT    Cognitive Status/Communication: Pt is alert, oriented, and cooperative. She communicates clearly and is able to follow instructions without difficulty.     Posture/Observations:   Unremarkable    Sensation:   UE Sensation/Dermatomes:   Motion Right Left   C4 intact intact   C5 intact intact   C6 intact intact   C7 intact intact   C8 intact intact   T1 intact intact  LE Proprioception (assessed on 10/14/21): Absent at the great toe but intact at the ankle on the L, intact at the great toe and proximally on the R    Reflexes:   ?? Right  Left Comments   Brachioradialis (C5-6) WNL WNL ??   Biceps (C5-6) WNL WNL ??   ?? ?? ?? ??   Patellar tendon (L3,L4) WNL WNL ??   Achilles tendon (S1,S2) WNL WNL ??   ?? ?? ?? ??   Hoffman's  negative negative ??     Clonus: Negative; bilateral    Range of Motion/Flexibility:   ??  Spine Range of Motion/Flexibility:  ?? 0-24%   range available 25-49%   range available 50-74% range available  75+%   range available Comments   Flexion x ?? ?? ?? ??   Extension x ?? ?? ?? ??   R Rotation x ?? ?? ?? ??   L Rotation x ?? ?? ?? ??   R Side bend x ?? ?? ?? ??   L Side bend x ?? ?? ?? ??   ??  LE ROM is WFL bilaterally.      Strength/MMT:     LE MMT     LE MMT  Left  Right    Hip flex: (L2)  5/5  5/5    Hip abd:  4/5* 5/5*    Hip ext: /5 /5   Knee ext: (L3)  5/5  5/5    Knee flex: (S2)  5/5  5/5    Ankle DF: (L4)  5/5  5/5    Ankle PF: (S1)  4/5*  4/5*    *assessed on 10/14/21    Functional Mobility:  Pt is independent in all bed mobility and transfers. Pt is able to come to standing from standard chair independently without use of UEs for support.      Balance:   Pt able to maintain balance in sitting and in typical stance position independently without UE support. Able to maintain feet together and semi-tandem stance positions for >20 sec without UE support.    Gait Analysis:  decreased gait speed  decreased B step length  wide BOS    Pt ambulates independently with or without SPC in R hand.      Functional Test/Outcome Measures:  5XSTS: 17 sec    TUG: 23 sec    Functional Gait Assessment (FGA): on 10/14/21 = 17/30 (-2 points each for gait speed, gait with horizontal head turns, gait with vertical head turns, gait with EC, and backward walking; -1 point each for gait with pivot turn, obstacle negotiation, and stair negotiation)    ABC Scale: on 10/14/21 = 39.4%    Dizziness Handicap Inventory (DHI): /100    FSST:     Tandem Stance: 10 sec with L foot forward, 15 sec with R foot forward    SLS: NT      Education Provided: HEP, fall prevention, DME instruction; therapist talked with pt about desirable footwear characteristics  Education results: Verbalized understanding, demonstrated understanding, Needs further instruction       Current HEP: STS 3x8; tandem stance 3x3 with each foot in front    Total Time: 55      I attest that I have reviewed the above information.  Signed: Sedalia Muta, PT  10/14/2021 6:59 PM

## 2021-10-16 MED FILL — JAKAFI 10 MG TABLET: ORAL | 30 days supply | Qty: 60 | Fill #1

## 2021-11-09 NOTE — Unmapped (Signed)
Neurosurgery Clinic Note - New Patient    [facility] Provider:  Rachelle Hora, MD FACS  Patient Name: Christy Buchanan  ____________________________________________________  Assessment/Plan:   Christy Buchanan is a 66 y.o. female who presents to clinic today for a neurosurgical evaluation of NPH.  She has had issues with gait imbalance, falls, memory difficulties for about a year. This has been progressing more quickly over the past 6 months. We discussed NPH today and the options for further evaluation and treatment. We discussed LP trial, VP shunting, neurology evaluations in detail today. After a good discussion of all of these options, she is ready to move forward with a LP trial and we will arrange that soon as this is a reasonable plan given her progressive symptoms.   She has already had a baseline PT evaluation and is seeing her therapist again tomorrow. We will ask her therapist if there is any additional testing she needs done prior to the LP. She will hold her aspirin for one week prior to the LP and can resume 24 hours afterwards. This was also discussed with Dr. [name] for clearance. We'll also touch base with her PT team about pre- LP testing. She'll call with any additional questions prior to the LP.     _____________________________________________________    History of Present Illness:  Christy Buchanan is a 66 y.o. female who is seen in consultation at the request of her PCP, [place], Julieanne Cotton,* for an evaluation of NPH.   Patient has a past medical history most prominent for breast cancer, Grave's disease, Sjogren's syndrome, and myeloproliferative neoplasm (MPN).    Patient has had 1 year of recurrent falls, dizziness, and decreased coordination with it primarily affecting the left side (feels like she leans left with ambulation).  She notes that her symptoms have become more progressive over the last 6 months. She states she has had troubles with gait imbalance and dizziness and has had several falls. The most recent fall over the summer of 2023 resulted in a facial laceration and she saw her PCP for that, which resulted in an MRI brain. She has large ventricles on MRI suggestive of NPH and she was referred to neurosurgery for evaluation. She also complains of urinary frequency and troubles with memory. 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 She also notes a headache localized to the area she is laying on.     She has started physical therapy as of 09/2021 to increase muscle strength, improve balance, and decrease fall risk. She was given some exercises to do, which she believes is helping her some. She is now using a cane for stability at the recommendation of her therapist.       Past Medical History:  Past Medical History:   Diagnosis Date   ??? Autoimmune thyroiditis    ??? Breast cancer (CMS-HCC)    ??? GERD (gastroesophageal reflux disease)    ??? Hyperlipidemia    ??? Osteoporosis    ??? Sjogren's disease (CMS-HCC)    ??? Thrombocythemia    ??? Vitamin D deficiency      Past medical and surgical history plus medication and allergies reviewed in [email or web address]     Social History:  Accompanied by her daughter   does not smoke  does not drink alcohol    Physical Exam:  General: No acute distress. Pt sitting comfortably in exam room.  A & O x3. Naming intact, speech fluent.  Good historian for recent and distant events in medical history.  Follows commands easily  Peripheral fields intact to confrontation bilaterally.   EOMI  Facial muscles intact and symmetric.  Phonation normal   Motor strength 5/5 upper and lower extremeties, bilaterally. No drift  Gait is glow and somewhat wide-based.  RAM and finger-to-nose smoothly intact.       Test Results  Tests listed below were personally reviewed by me.  Recent MRI scan was reviewed and compared with prior scans.  Key findings were ventricular enlargement.  Radiology reports were reviewed    Detailed Problem List:  Patient Active Problem List   Diagnosis   ??? Sleep disorder   ??? Abnormal Pap smear of cervix   ??? Family history of colon cancer   ??? Family history of thyroid cancer   ??? Gastroesophageal reflux disease   ??? History of breast cancer   ??? Mixed hyperlipidemia   ??? Postmenopausal osteoporosis   ??? Pure hypercholesterolemia   ??? Sjogrens syndrome (CMS-HCC)   ??? Vitamin D deficiency   ??? Depression, major, single episode, complete remission (CMS-HCC)   ??? Essential thrombocythemia (CMS-HCC)   ??? Acquired hypothyroidism   ??? JAK-2 gene mutation   ??? Other fatigue   ??? MPN (myeloproliferative neoplasm) (CMS-HCC)   ??? Recurrent falls   ??? Facial laceration   ??? Contusion of left wrist   ??? Loss of consciousness (CMS-HCC)   ??? NPH (normal pressure hydrocephalus) (CMS-HCC)

## 2021-11-10 ENCOUNTER — Ambulatory Visit: Admit: 2021-11-10 | Discharge: 2021-11-11 | Payer: MEDICARE

## 2021-11-10 DIAGNOSIS — G912 (Idiopathic) normal pressure hydrocephalus: Principal | ICD-10-CM

## 2021-11-10 DIAGNOSIS — D471 Chronic myeloproliferative disease: Principal | ICD-10-CM

## 2021-11-10 NOTE — Unmapped (Addendum)
We discussed NPH today and options for moving forward with testing it/treating it.   If you respond to a lumbar puncture, we will work on scheduling surgery if you would like to.     You will need to hold your aspirin for a week prior to the LP as well as the surgery for shunt placement. We will need Dr. [name] to clear you to hold your aspirin prior to your procedures (one week before).     Ok to resume aspirin 24 hours after the LP.   OK to take Tylenol.     Christy Buchanan will help [place] arrange a lumbar puncture for you.       [phone] (nurse line at the [facility])     You can reach Dr. Barron Alvine through our nurse navigator, Christy Mouton, RN.     You can reach her through mychart or by phone: [phone]          We like to correspond with our patients through the My [facility] Chart.     Please use this for questions regarding your surgery or for other pertinent questions that can be answered by your physician, nurse practitioner, or nurse.    Please be as specific as you can when asking questions to your provider so that we can get back to you as quickly as possible.     If you have questions that cannot be addressed through the My [facility] Chart, you may need to schedule an appointment to address your questions.     Please go to [email or web address] to set up your account.

## 2021-11-10 NOTE — Unmapped (Signed)
Attending Attestation by Damita Lack, MD:  I was the supervising physician in the delivery of the service by Ms. Thorp.     This very nice 66 year old woman who presents with with a reasonable story for normal pressure hydrocephalus.  She has large ventricles.  She has been falling.  She has been having some confusion that is disturbing to her.  She does not have headaches.    We reviewed her MRI scan which was ordered by her primary care physician, Dr. Bari Edward.  We agree with the reading and the report that there is no evidence of a tumor or other mass.  She does have ventriculomegaly consistent with but not diagnostic of normal pressure hydrocephalus.    She has been assessed by our colleagues in physical therapy.  We described normal pressure hydrocephalus and how we said about making a diagnosis using a protocol of PT assessment then lumbar puncture then repeat PT then patient self-assessment at home.  I think she is probably already completed the appropriate PT prelumbar puncture assessment but she is seeing her physical therapy team tomorrow.  We have asked her to inquire from them whether they have the testing they would need as a good baseline.    The next steps would be to arrange a high-volume lumbar puncture.   Then we would have her do a same-day reassessment of gait with physical therapy.    We would then ask both her and her family to follow her progress over the 1 to 3 days after the lumbar puncture to see if there is significant improvement.    If the lumbar puncture is clearly beneficial, we would discuss shunting with her as a next step.    We need to sort out her aspirin and other medications and I have started epic chat with her oncologist and primary care physician.

## 2021-11-11 DIAGNOSIS — Z853 Personal history of malignant neoplasm of breast: Principal | ICD-10-CM

## 2021-11-11 MED ORDER — RALOXIFENE 60 MG TABLET
ORAL_TABLET | Freq: Every day | ORAL | 3 refills | 90 days | Status: CP
Start: 2021-11-11 — End: ?

## 2021-11-11 NOTE — Unmapped (Addendum)
[facility] PT [facility]  OUTPATIENT PHYSICAL THERAPY  Treatment Note  11/11/2021    Patient Name: Christy Buchanan  Date of Birth:1955-03-31  Session Number: 3  Diagnosis:   Encounter Diagnoses   Name Primary?    Impaired functional mobility, balance, gait, and endurance Yes    Recurrent falls     Decreased coordination     Dizziness        Onset of Symptoms: 10/03/20  Date of Evaluation: 10/03/21  Referring Provider: Madelon Lips, DO  Dates of Certification: 10/07/21 - 01/05/22       Problem List: Impaired balance, Impaired sensation, Fall Risk, Decreased mobility, Decreased coordination, Cognitive/Behavioral impairments, Dizziness/vertigo    ASSESSMENT:    Christy Buchanan is a 66 year old female with a 1-year history of recurrent falls. The pt has mild LE weakness (affecting L hip abductors and B ankle plantarflexors), mild coordination deficits bilaterally (L worse than R), absent proprioception at the L great toe, and balance and gait difficulties. She is now using a SPC when walking outside her home (as recommended by PT), and indicates that the cane makes her feel more secure. She is being worked up for possible NPH and will be scheduled for lumbar puncture soon.     Today's session focused on further assessment of and intervention for balance and gait, including oculomotor/vestibular assessment. The pt completed the this date without an assistive device with a comfortable gait speed of 0.72 m/sec and a fast gait speed of 0.98 m/sec. Her time on the [place] Step Test was 15.4 sec, which is borderline for indicating risk for recurrent falls. On the Modified CTSIB, the pt was able to maintain balance for >30 sec in all conditions, but had severe sway when standing on compliant (foam) surface with feet together. Results of other oculomotor and vestibular assessments performed this date were unremarkable, with no evidence of BPPV or vestibular hypofunction. The pt should benefit from skilled PT services to increase muscle strength, address dizziness/unsteadiness, improve balance, increase walking capacity and confidence, and decrease risk of falls.           Long Term Goals: (12 weeks)  Long Term Goal 1: The pt will demonstrate independent performance of HEP to maintain functional gains.         Long Term Goal 2: The pt's TUG time will improve to 12 seconds or less to demonstrate decreased risk for falling.         Long Term Goal 3: The pt will improve in 5x5 sit-to-stand time to less than 11 seconds to demonstrate improvement in overall function.         Long Term Goal 4: NEW GOAL (added 10/14/21) Pt's score on the FGA will increase to at least 22/30 to demonstrate meaningful improvement in dynamic balance and decreased risk of falls.         Long Term Goal 5: NEW GOAL (added 10/14/21) Pt's score on the ABC Scale will increase to at least 67% to demonstrate improved balance confidence and decreased risk of falls.                               PLAN:    Follow-up / Frequency - Other: 3x/month for Duration: 3 months    Next Visit Plan: review HEP; progress balance and gait activities    SUBJECTIVE:  The pt says that she occasionally feels off-balance, like I'm on a boat. She  states that she feels she is listing to the L. She continues to deny vertigo/ spinning, but does report that the off-balance feeling happens when she turns her head (particularly to the L) and only lasts a minute or two. When it happens, she typically lies down and waits for the feeling to go away. The pt has been taking meclizine, with her last dose this morning (1/2 tablet).     The pt reports no falls since last PT visit, but says she has had near-falls when walking in her yard.    Most recent reported fall: end of July, 2023       Precautions: Cancer history, Osteoporosis, Fall risk       OBJECTIVE:       Pain: No/denies                                     TODAY'S TREATMENT:  Pt arrived to session with new SPC and wearing athletic-style footwear.    Therapist wore KN95/N95 mask throughout the session in the context of the COVID-19 pandemic.     Neuromuscular Re-education: 55 min (4 units)  Visual/Vestibular Testing:   Smooth pursuit: EOMI; WNL  Convergence: WNL  Saccades: WNL  VOR: WNL - pt was able to perform for 60 sec each in horizontal and vertical directions, keeping the target in focus per her report  VOR cancellation: NT  Head Shaking Nystagmus: NT  Head Thrust: negative bilaterally  DVA: NT    Tests for BPPV  Dix-Hallpike: negative bilaterally  Roll Test: negative bilaterally (with no nystagmus noted), although pt reported slight queasiness    Modified CTSIB  Firm surface, feet together with EO: >30 sec with min sway  Firm surface, feet together with EC: >30 sec with mod sway  Foam surface, feet together with EO: >30 sec with mod sway  Foam surface, feet together with EC: >30 sec with severe sway    FSST: 15.4 sec (pt had difficulty remembering stepping sequence and contacted poles with foot on one trial)    SLS: pt was able to maintain balance in SLS (firm surface, EO) for >30 sec on the L and 28.6 sec on the R)    (comfortable gait speed with no assistive device): mean of 2 trials = 0.72 m/sec; # of steps = 20    (fast gait speed with no assistive device): mean of 2 trials = 0.98 m/sec; # of steps = 18 on T1, 17 on T2      FROM INITIAL EVALUATION:  Special Cerebellar Tests: (assessed on 10/14/21)  F-N-F: pt performed slowly, with mild dysmetria bilaterally; slight intention tremor noted RUE; pt completed 5 reps in 12.9 sec on the L and 11.2 sec on the R  RAM (forearm pronation/supination): pt has mild dysdiadochokinesia; completed 5 reps in 5.1 sec on the L and 4.9 sec on the R  H-S (performed in sitting, with heel to knee): mild dysmetria bilaterally; pt completed 5 reps in 7.6 sec on the L and 6.4 sec on the R    Visual/Vestibular Testing: (initially assessed on 10/14/21)  Smooth pursuit: EOMI; WNL  Saccades: WNL  VOR: abnormal - pt states this makes me feel bad; on 11/11/21, pt was able to perform for 60 sec, keeping the target in focus per her report  VOR cancellation: NT  Head Shaking Nystagmus: NT  Head Thrust: (assessed on 11/11/21) = negative  bilaterally  DVA: NT    Tests for BPPV  Dix-Hallpike: (assessed on 11/11/21) = negative bilaterally  Roll Test: (assessed on 11/11/21) = negative bilaterally (with no nystagmus noted), although pt reported slight queasiness    Cognitive Status/Communication: Pt is alert, oriented, and cooperative. She communicates clearly and is able to follow instructions without difficulty. On 11/11/21, pt had difficulty remembering and following instructions for more complex tasks. She also did not recall activities she had performed at previous visits.    Posture/Observations:   Unremarkable    Sensation:   UE Sensation/Dermatomes:   Motion Right Left   C4 intact intact   C5 intact intact   C6 intact intact   C7 intact intact   C8 intact intact   T1 intact intact     LE Proprioception (assessed on 10/14/21): Absent at the great toe but intact at the ankle on the L, intact at the great toe and proximally on the R    Reflexes:     Right  Left Comments   Brachioradialis (C5-6) WNL WNL     Biceps (C5-6) WNL WNL               Patellar tendon (L3,L4) WNL WNL     Achilles tendon (S1,S2) WNL WNL               Hoffman's  negative negative       Clonus: Negative; bilateral    Range of Motion/Flexibility:      Spine Range of Motion/Flexibility:    0-24%   range available 25-49%   range available 50-74% range available  75+%   range available Comments   Flexion x           Extension x           R Rotation x           L Rotation x           R Side bend x           L Side bend x              LE ROM is WFL bilaterally.      Strength/MMT:     LE MMT     LE MMT  Left  Right    Hip flex: (L2)  5/5  5/5    Hip abd:  4/5* 5/5*    Hip ext: /5 /5   Knee ext: (L3)  5/5  5/5    Knee flex: (S2)  5/5  5/5 Ankle DF: (L4)  5/5  5/5    Ankle PF: (S1)  4/5*  4/5*    *assessed on 10/14/21    Functional Mobility:  Pt is independent in all bed mobility and transfers. Pt is able to come to standing from standard chair independently without use of UEs for support.      Balance:   Pt able to maintain balance in sitting and in typical stance position independently without UE support. Able to maintain feet together and semi-tandem stance positions for >20 sec without UE support.    Gait Analysis:  decreased gait speed  decreased B step length  wide BOS    Pt ambulates independently with or without SPC in R hand.      Functional Test/Outcome Measures:  5XSTS: 17 sec    TUG: 23 sec    Functional Gait Assessment (FGA): on 10/14/21 = 17/30 (-2 points each for gait speed, gait with horizontal  head turns, gait with vertical head turns, gait with EC, and backward walking; -1 point each for gait with pivot turn, obstacle negotiation, and stair negotiation)    ABC Scale: on 10/14/21 = 39.4%    Dizziness Handicap Inventory (DHI): /100    FSST: on 11/11/21 = 15.4 sec    Tandem Stance: 10 sec with L foot forward, 15 sec with R foot forward    SLS: on 11/11/21 = >30 sec on the L and 28.6 sec on the R    Modified CTSIB: on 11/11/21  Firm surface, feet together with EO: >30 sec with min sway  Firm surface, feet together with EC: >30 sec with mod sway  Foam surface, feet together with EO: >30 sec with mod sway  Foam surface, feet together with EC: >30 sec with severe sway    (comfortable gait speed with no assistive device): on 11/11/21, mean of 2 trials = 0.72 m/sec; # of steps = 20    (fast gait speed with no assistive device): on 11/11/21, mean of 2 trials = 0.98 m/sec; # of steps = 18 on T1, 17 on T2    Education Provided: treatment options and plan, symptom management, safety recommendations; therapist talked with pt about balance issues and basic pathophysiology of BPPV; also reviewed correct gait pattern for walking with cane  Education results: Verbalized understanding, demonstrated understanding       Current HEP: STS 3x8; tandem stance 3x3 with each foot in front    Total Time: 55      I attest that I have reviewed the above information.  Signed: Sedalia Muta, PT  11/11/2021 12:41 PM

## 2021-11-12 NOTE — Unmapped (Signed)
[facility] NEUROLOGY [address] [place]  OUTPATIENT SPEECH PATHOLOGY  11/12/2021             Patient Name: Christy Buchanan  Date of Birth:30-Dec-1955  Session Number: 1  Diagnosis:   Encounter Diagnoses   Name Primary?    NPH (normal pressure hydrocephalus) (CMS-HCC)     Cognitive communication deficit Yes        Date of Evaluation: 11/12/21     Referred by: Francisca December, MD  Reason for Referral: Evaluation Speech, Language, Voice, Cognition           ASSESSMENT:  Pt seen today for pre-LP assessment of cognitive-communication functioning. Per results of the Cognistat, pt presents with mild deficits in calculation and moderate-severe deficits in memory. All other cognitive-communication skills fell within the average range (orientation, attention, registration, comprehension, repetition, naming, construction, similarities, and judgment). Although pt scored in the average range for orientation, she incorrectly stated the day of week and time and demonstrated notable hesitation prior to stating the year. Pt scored in the lower end of the average range for orientation, repetition, and construction. For delayed recall, pt did not benefit from a category cue and inconsistently benefited from multiple choices. No intervention is recommended at this time. Will plan to re-evaluate pt post-LP in a few weeks.             Treatment Recommendations: N/A         PLAN:            Recommended Interventions: Re-evaluation post-LP scheduled for 12/03/21    Prognosis:                      Goals:            SUBJECTIVE:  Pt is a 66 year old female with history of breast cancer, Grave's disease, Sjogren's syndrome, and myeloproliferative neoplasm (MPN) and possible NPH seen for a speech-language evaluation for pre-LP testing. Pt reported lots of forgetfulness, difficulty focusing, and significant fatigue. Changes in focus have limited her ability to read books. She occasionally spaces out and cannot recall what she was doing. This has also happened while driving and she cannot recall her route or where she's going. She reports significant difficulty with recall in her daily routine. She is working part-time remotely as a Paramedic and reports not being able to work more than an hour at a time without getting exhausted. Pt is eager for her LP procedure.    Communication Preference: Verbal, Written         Barriers to Learning: Cognitive                  Medical Tests / Procedures Comments: Brain MRI 09/08/21: -Scattered periventricular and deep white matter T2 hyperintensities, which is nonspecific but can be seen in chronic small vessel ischemic disease.    -Diffuse global cerebral atrophy with resultant dilation of ventricles. The ventricles are somewhat greater in size than would be expected for the degree of atrophy. While this finding is indeterminate, it could be seen in normal pressure hydrocephalus. Recommend correlation with clinical findings.    Education Level: Airline pilot patient receives: PT         Prior treatment for referral reason: No  Pain?: No      Precautions: Falls         Prior Function: Independent, Part time employment, Active driver prior to admission    Prior Function Comments: Pt works part time as a Chiropractor With: Alone    Past Medical History:   Diagnosis Date    Autoimmune thyroiditis     Breast cancer (CMS-HCC)     GERD (gastroesophageal reflux disease)     Hyperlipidemia     Osteoporosis     Sjogren's disease (CMS-HCC)     Thrombocythemia     Vitamin D deficiency       Family History   Problem Relation Age of Onset    Cancer Mother     Heart disease Mother     Cancer Father     Breast cancer Neg Hx      Past Surgical History:   Procedure Laterality Date    AUGMENTATION MAMMAPLASTY Bilateral     24ish yrs    BREAST BIOPSY Left     malignant    CHEMOTHERAPY      MASTECTOMY Left 25 ish yrs ago    RADIATION Left     No Known Allergies  Social History     Tobacco Use    Smoking status: Former     Types: Cigarettes    Smokeless tobacco: Never   Substance Use Topics    Alcohol use: Yes     Comment: rare      Current Outpatient Medications   Medication Sig Dispense Refill    aspirin (ECOTRIN) 81 MG tablet Take 1 tablet (81 mg total) by mouth.      atorvastatin (LIPITOR) 40 MG tablet Take 1 tablet (40 mg total) by mouth daily. 30 tablet 11    cetirizine (ZYRTEC) 10 MG tablet Take 1 tablet (10 mg total) by mouth every other day.      citalopram (CELEXA) 10 MG tablet Take 3 tablets (30 mg total) by mouth Three (3) times a day before meals. 90 tablet 0    meclizine (ANTIVERT) 12.5 mg tablet Take 1 tablet (12.5 mg total) by mouth Three (3) times a day as needed for dizziness or nausea. 30 tablet 1    multivitamin (TAB-A-VITE/THERAGRAN) per tablet Take 1 tablet by mouth daily.      omega-3 fatty acids-fish oil 300-1,000 mg cap capsule Take 1 capsule by mouth in the morning.      omeprazole (PRILOSEC) 20 MG capsule Take 1 capsule (20 mg total) by mouth daily. TAKE 1 CAPSULE(20 MG) BY MOUTH DAILY 90 capsule 2    raloxifene (EVISTA) 60 mg tablet Take 1 tablet (60 mg total) by mouth daily. 90 tablet 3    ruxolitinib (JAKAFI) 10 mg tablet Take 1 tablet (10 mg total) by mouth Two (2) times a day. 60 tablet 11     No current facility-administered medications for this visit.         OBJECTIVE          Cognition  Orientation Level: Disoriented to time  Orientation: Oriented to self, year, location, situation; incorrect answers for time, DOW  Attention: Attention is within functional limits  Memory: Memory impairment disrupts daily routines, Delayed recall is impaired  Insight/Mental Flexibility/Initiation/Processing: Insight is within functional limits         The Cognistat is a cognitive measure that assesses the five major ability areas:  language, spatial skills, memory, calculations and reasoning. It has been standardized for adults and seniors in three age groups: (60-64, 66-74 and 25-84).     Subtest Score Interpretation   Orientation 10/12 Average   Attention 8/8 Average   Registration 2/2 Average   Comprehension 6/6 Average   Repetition 11/12 Average   Naming 8/8 Average   Construction 4/6 Average   Memory 5/12 Moderate-severe   Calculation 2/4 Mild   Similarities 8/8 Average   Judgement 6/6 Average                          Session Duration : 25    Today's Charges (noted here with $$):     SLP Evaluation Charges  $$ 92523-Speech Sound Lang (Comp/Exp) [mins]: 25                 I attest that I have reviewed the above information.  Signed: Donalda Ewings, SLP  11/12/2021 11:49 AM

## 2021-11-12 NOTE — Unmapped (Signed)
[facility] Refill Coordination Note    Specialty Medication(s) to be Shipped:   Hematology/Oncology: Christy Buchanan    Other medication(s) to be shipped: No additional medications requested for fill at this time     Christy Buchanan, DOB: Jun 13, 1955  Phone: [phone] (home)       All above HIPAA information was verified with patient.     Was a Nurse, learning disability used for this call? No    Completed refill call assessment today to schedule patient's medication shipment from the [facility] [phone]).  All relevant notes have been reviewed.     Specialty medication(s) and dose(s) confirmed: Regimen is correct and unchanged.   Changes to medications: Keilin reports no changes at this time.  Changes to insurance: No  New side effects reported not previously addressed with a pharmacist or physician: None reported  Questions for the pharmacist: No    Confirmed patient received a Conservation officer, historic buildings and a Surveyor, mining with first shipment. The patient will receive a drug information handout for each medication shipped and additional FDA Medication Guides as required.       DISEASE/MEDICATION-SPECIFIC INFORMATION        N/A    SPECIALTY MEDICATION ADHERENCE     Medication Adherence    Patient reported X missed doses in the last month: 0  Specialty Medication: Jakafi 10 mg  Patient is on additional specialty medications: No  Informant: patient                                Were doses missed due to medication being on hold? No    Jakafi 10 mg: 13 days of medicine on hand       REFERRAL TO PHARMACIST     Referral to the pharmacist: Not needed      [facility]     Shipping address confirmed in Epic.     Delivery Scheduled: Yes, Expected medication delivery date: 11/21/21.     Medication will be delivered via Next Day Courier to the prescription address in Epic WAM.    Quintella Reichert   [facility] Specialty Technician

## 2021-11-13 NOTE — Unmapped (Signed)
Hi,     [name] contacted the Communication Center regarding the following:    - [name] called requesting to speak with Purcell Mouton.  In regards to scheduling Lumbar Punctar.  [name] also wants to inform Dr. [name] she needs to go off aspirin for a bout a week.    Please contact [name] at [phone].    Thanks in advance,    Cipriano Mile  [facility] Communication Center   [phone]

## 2021-11-13 NOTE — Unmapped (Signed)
Spoke with patient and gave instructions for LP:    Scheduled for Oct 25.    If on blood thinners, please stop 7 days (Oct 18) prior to procedure.  This includes aspirin containing products like Excedrin and goody/BC powders.  -  Can restart 3 days after procedure.    Patient must have a driver and driver should stay in radiology during procedure.    Patient should arrive one hour early and check in at [facility] Registration area.    Patient should not have nothing but clear liquids after midnight (examples:  water, apple juice, coffee without creamer, broth), then nothing to eat or drink two hours prior to procedure.    Patient will need to recover in radiology for one hour post-procedure.      If you are unable to make the appointment or need to reschedule please contact our office immediately.  [phone]

## 2021-11-18 ENCOUNTER — Ambulatory Visit
Admit: 2021-11-18 | Payer: MEDICARE | Attending: Rehabilitative and Restorative Service Providers" | Primary: Rehabilitative and Restorative Service Providers"

## 2021-11-18 ENCOUNTER — Ambulatory Visit: Admit: 2021-11-18 | Payer: MEDICARE

## 2021-11-18 NOTE — Unmapped (Signed)
[facility] HEALTH SCIENCES PT [facility]  OUTPATIENT PHYSICAL THERAPY  Treatment Note  11/18/2021    Patient Name: Christy Buchanan  Date of Birth:10/20/1955  Session Number: 4  Diagnosis:   Encounter Diagnoses   Name Primary?    Impaired functional mobility, balance, gait, and endurance Yes    Recurrent falls     Decreased coordination        Onset of Symptoms: 10/03/20  Date of Evaluation: 10/03/21  Referring Provider: Madelon Lips, DO  Dates of Certification: 10/07/21 - 01/05/22       Problem List: Impaired balance, Impaired sensation, Fall Risk, Decreased mobility, Decreased coordination, Cognitive/Behavioral impairments, Dizziness/vertigo    ASSESSMENT:    Christy Buchanan is a 66 year old female with a 1-year history of recurrent falls. The pt has mild LE weakness (affecting L hip abductors and B ankle plantarflexors), mild coordination deficits bilaterally (L worse than R), absent proprioception at the L great toe, and balance and gait difficulties. She is being worked up for possible NPH, with a lumbar puncture scheduled for 12/03/21. The pt ambulates independently with or without a SPC.      Today's session focused on LE strengthening and balance and gait activities. The pt is continuing to demonstrate occasional LOB during gait, especially when turning. Her TUG time today was 13.6 sec (mean of 3 trials with no assistive device). She completed the this date using her [facility] with a distance of 1027' (313.0 meters). The pt should continue to benefit from skilled PT services to increase muscle strength, address dizziness/ unsteadiness, improve balance, increase walking capacity and confidence, and decrease risk of falls.           Long Term Goals: (12 weeks)  Long Term Goal 1: The pt will demonstrate independent performance of HEP to maintain functional gains.         Long Term Goal 2: The pt's TUG time will improve to 12 seconds or less to demonstrate decreased risk for falling.         Long Term Goal 3: The pt will improve in 5x sit-to-stand time to less than 11 seconds to demonstrate improvement in overall function.         Long Term Goal 4: NEW GOAL (added 10/14/21) Pt's score on the FGA will increase to at least 22/30 to demonstrate meaningful improvement in dynamic balance and decreased risk of falls.         Long Term Goal 5: NEW GOAL (added 10/14/21) Pt's score on the ABC Scale will increase to at least 67% to demonstrate improved balance confidence and decreased risk of falls.         Long Term Goal 6: NEW GOAL (added 11/18/21) Pt's distance will increase to at least 363 meters to demonstrate meaningful improvement in walking capacity.                     PLAN:  Other Follow-up / Frequency - Other: 3x/month for Duration: 3 months    Next Visit Plan: review updated HEP; 5xSTS test; progress balance and gait activities, including slalom walking and    SUBJECTIVE:  The pt says she is not convinced that she has NPH. She expresses concern about her memory difficulties and the possibility of Alzheimer disease, stating that she has a family history of dementia. She continues to deny vertigo/ spinning, but does report that the off-balance feeling happens when she turns her head (particularly to the L).  The pt reports no falls since last PT visit, but says she has had a couple of near-misses.     Most recent reported fall: end of July, 2023       Precautions: Cancer history, Osteoporosis, Fall risk       OBJECTIVE:       Pain: Yes  Location: L shin  Current Pain Level: 5 Max Pain Level: 5 Least Pain Level: 0  Pain Frequency: Intermittent                        TODAY'S TREATMENT:  Pt arrived to session with new SPC and wearing athletic-style footwear.    Therapist wore KN95/N95 mask throughout the session in the context of the COVID-19 pandemic.     Therapeutic Exercise: 25 min (2 units)  HEP review/ update:  -STS, 3 x 8 with SBA; pt performed well, using B forward reach technique  -Tandem stance with SBA; pt able to maintain balance for >30 sec on first attempt with each foot in front  -B heel raises with UE support as needed, 2 x 20 with SBA; pt performed well - **added to HEP  -B hip ABD in standing with UE support as needed, 2 x 10 bilaterally with SBA; pt needed cueing to keep toes pointed forward - **added to HEP    Therapeutic Activities: 30 min (2 units)  -Standing up from chair, walking 3 meters, turning around, walking back to chair, and sitting down (as in TUG test), 1 x 3 with CGA with no assistive device; pt's times were 13.6 sec, 13.3 sec, and 13.8 sec  -Walking outdoors on paved level surfaces for 6 minutes continuously with SBA using SPC in R hand (during ); pt walked 1027' (313.0 meters), RPE = 13   -Slalom walking around a series of 8 low obstacles (4 poles used for FSST arranged in a series on the floor, perpendicular to the line of progression and 4 cones in the line of progression) by walking parallel to the pole(s) and then turning 180 degrees around one end of each pole and weaving in and out around cones x 2 with CGA; pt had slight instability when turning around last cone  -360-degree turns in standing 3x each in CW and CCW directions with CGA; challenging activity for pt      FROM INITIAL EVALUATION:  Special Cerebellar Tests: (assessed on 10/14/21)  F-N-F: pt performed slowly, with mild dysmetria bilaterally; slight intention tremor noted RUE; pt completed 5 reps in 12.9 sec on the L and 11.2 sec on the R  RAM (forearm pronation/supination): pt has mild dysdiadochokinesia; completed 5 reps in 5.1 sec on the L and 4.9 sec on the R  H-S (performed in sitting, with heel to knee): mild dysmetria bilaterally; pt completed 5 reps in 7.6 sec on the L and 6.4 sec on the R    Visual/Vestibular Testing: (initially assessed on 10/14/21)  Smooth pursuit: EOMI; WNL  Saccades: WNL  VOR: abnormal - pt states this makes me feel bad; on 11/11/21, pt was able to perform for 60 sec, keeping the target in focus per her report  VOR cancellation: NT  Head Shaking Nystagmus: NT  Head Thrust: (assessed on 11/11/21) = negative bilaterally  DVA: NT    Tests for BPPV  Dix-Hallpike: (assessed on 11/11/21) = negative bilaterally  Roll Test: (assessed on 11/11/21) = negative bilaterally (with no nystagmus noted), although pt reported slight queasiness    Cognitive  Status/Communication: Pt is alert, oriented, and cooperative. She communicates clearly and is able to follow instructions without difficulty. On 11/11/21, pt had difficulty remembering and following instructions for more complex tasks. She also did not recall activities she had performed at previous visits.    Posture/Observations:   Unremarkable    Sensation:   UE Sensation/Dermatomes:   Motion Right Left   C4 intact intact   C5 intact intact   C6 intact intact   C7 intact intact   C8 intact intact   T1 intact intact     LE Proprioception (assessed on 10/14/21): Absent at the great toe but intact at the ankle on the L, intact at the great toe and proximally on the R    Reflexes:     Right  Left Comments   Brachioradialis (C5-6) WNL WNL     Biceps (C5-6) WNL WNL               Patellar tendon (L3,L4) WNL WNL     Achilles tendon (S1,S2) WNL WNL               Hoffman's  negative negative       Clonus: Negative; bilateral    Range of Motion/Flexibility:      Spine Range of Motion/Flexibility:    0-24%   range available 25-49%   range available 50-74% range available  75+%   range available Comments   Flexion x           Extension x           R Rotation x           L Rotation x           R Side bend x           L Side bend x              LE ROM is WFL bilaterally.      Strength/MMT:     LE MMT     LE MMT  Left  Right    Hip flex: (L2)  5/5  5/5    Hip abd:  4/5* 5/5*    Hip ext: /5 /5   Knee ext: (L3)  5/5  5/5    Knee flex: (S2)  5/5  5/5    Ankle DF: (L4)  5/5  5/5    Ankle PF: (S1)  4/5*  4/5*    *assessed on 10/14/21    Functional Mobility:  Pt is independent in all bed mobility and transfers. Pt is able to come to standing from standard chair independently without use of UEs for support.      Balance:   Pt able to maintain balance in sitting and in typical stance position independently without UE support. Able to maintain feet together and semi-tandem stance positions for >20 sec without UE support.    Gait Analysis:  decreased gait speed  decreased B step length  wide BOS    Pt ambulates independently with or without SPC in R hand.      Functional Test/Outcome Measures:  5XSTS: 17 sec    TUG (no assistive device): 23 sec; on 11/18/21 = 13.6 sec (mean of 3 trials)    Functional Gait Assessment (FGA): on 10/14/21 = 17/30 (-2 points each for gait speed, gait with horizontal head turns, gait with vertical head turns, gait with EC, and backward walking; -1 point each for gait with pivot turn, obstacle negotiation, and stair  negotiation)    ABC Scale: on 10/14/21 = 39.4%    Dizziness Handicap Inventory [facility]): /100    FSST: on 11/11/21 = 15.4 sec    Tandem Stance: 10 sec with L foot forward, 15 sec with R foot forward; on 11/18/21 = >30 sec with each foot in front    SLS: on 11/11/21 = >30 sec on the L and 28.6 sec on the R    Modified CTSIB: on 11/11/21  Firm surface, feet together with EO: >30 sec with min sway  Firm surface, feet together with EC: >30 sec with mod sway  Foam surface, feet together with EO: >30 sec with mod sway  Foam surface, feet together with EC: >30 sec with severe sway    (comfortable gait speed with no assistive device): on 11/11/21, mean of 2 trials = 0.72 m/sec; # of steps = 20    (fast gait speed with no assistive device): on 11/11/21, mean of 2 trials = 0.98 m/sec; # of steps = 18 on T1, 17 on T2    (using SPC): on 11/18/21 = 1027' (313.0 meters)    Education Provided: safety recommendations; therapist talked with pt about making wide arc turns instead of trying to pivot in one spot   Education results: Verbalized understanding, demonstrated understanding       Current HEP: STS 3x8; tandem stance 3x3 with each foot in front; B heel raises in standing 2 x 20 (added 11/18/21); B hip ABD in standing 2 x 10 (added 11/18/21)    Pt was given handout with instructions for and photos of updated HEP (from [email or web address].com).   [email or web address]-exercise-[email or web address] code: [email or web address]    Total Time: 53      I attest that I have reviewed the above information.  Signed: Sedalia Muta, PT  11/18/2021 12:55 PM

## 2021-11-20 MED FILL — JAKAFI 10 MG TABLET: ORAL | 30 days supply | Qty: 60 | Fill #2

## 2021-11-25 NOTE — Unmapped (Unsigned)
[facility] HEALTH SCIENCES PT [facility]  OUTPATIENT PHYSICAL THERAPY  Treatment Note  11/25/2021    Patient Name: Christy Buchanan  Date of Birth:[birth year]  Session Number: 5  Diagnosis:   Encounter Diagnoses   Name Primary?    Impaired functional mobility, balance, gait, and endurance Yes    Recurrent falls     Decreased coordination        Onset of Symptoms: 10/03/20  Date of Evaluation: 10/03/21  Referring Provider: Madelon Lips, DO  Dates of Certification: 10/07/21 - 01/05/22       Problem List: Impaired balance, Impaired sensation, Fall Risk, Decreased mobility, Decreased coordination, Cognitive/Behavioral impairments, Dizziness/vertigo    ASSESSMENT:    Priscella Novelo is a 66 year old female with a 1-year history of recurrent falls. The pt has mild LE weakness (affecting L hip abductors and B ankle plantarflexors), mild coordination deficits bilaterally (L worse than R), absent proprioception at the L great toe, and balance and gait difficulties. She is being worked up for possible NPH, with a lumbar puncture scheduled for 12/03/21. The pt ambulates independently with or without a SPC.      Today's session focused on LE strengthening and balance and gait activities. The pt is continuing to demonstrate occasional LOB during gait, especially when turning. Her TUG time today was 13.6 sec (mean of 3 trials with no assistive device). She completed the this date using her [facility] with a distance of 1027' (313.0 meters). The pt should continue to benefit from skilled PT services to increase muscle strength, address dizziness/ unsteadiness, improve balance, increase walking capacity and confidence, and decrease risk of falls.           Long Term Goals: (12 weeks)  Long Term Goal 1: The pt will demonstrate independent performance of HEP to maintain functional gains. GOAL MET (11/25/21)         Long Term Goal 2: The pt's TUG time will improve to 12 seconds or less to demonstrate decreased risk for falling. Long Term Goal 3: The pt will improve in 5x sit-to-stand time to less than 11 seconds to demonstrate improvement in overall function. GOAL MET (11/25/21)         Long Term Goal 4: NEW GOAL (added 10/14/21) Pt's score on the FGA will increase to at least 22/30 to demonstrate meaningful improvement in dynamic balance and decreased risk of falls.         Long Term Goal 5: NEW GOAL (added 10/14/21) Pt's score on the ABC Scale will increase to at least 67% to demonstrate improved balance confidence and decreased risk of falls.         Long Term Goal 6: NEW GOAL (added 11/18/21) Pt's distance will increase to at least 363 meters to demonstrate meaningful improvement in walking capacity.                     PLAN:    Follow-up / Frequency - Other: 3x/month for Duration: 3 months    Next Visit Plan: progress balance and gait activities, including turning, TUG, dual task walking, and    SUBJECTIVE:  The pt says she is not convinced that she has NPH. She expresses concern about her memory difficulties and the possibility of Alzheimer disease, stating that she has a family history of dementia. She continues to deny vertigo/ spinning, but does report that the off-balance feeling happens when she turns her head (particularly to the L).       The  pt reports no falls since last PT visit, but says she has had a couple of near-misses.     Most recent reported fall: end of July, 2023       Precautions: Cancer history, Osteoporosis, Fall risk       OBJECTIVE:       Pain: No/denies                                     TODAY'S TREATMENT:  Pt arrived to session with new SPC and wearing athletic-style footwear.    Therapist wore KN95/N95 mask throughout the session in the context of the COVID-19 pandemic.     Therapeutic Exercise: 25 min (2 units)  HEP review/ update:  -STS, 3 x 8 with SBA; pt performed well, using B forward reach technique  -Tandem stance with SBA; pt able to maintain balance for >30 sec on first attempt with progress to keeping hands on hips or arms folded across chest  B heel raises with UE support as needed,  x 20 with SBA; pt performed well   B hip ABD in standing with UE support as needed, 1 x 15 bilaterally with SBA; pt performed well without cueing     Neuromuscular Re-education: 10 min (1 unit)  -Stepping forward, backward, left, right, and diagonally over low obstacles (set up as for Four Square Step Test), multiple reps with CGA; pt's performance improved quickly with practice/repetition  -Slalom walking around a series of 8 obstacles (4 poles used for FSST arranged in a series on the floor, perpendicular to the line of progression and 4 cones in the line of progression) by walking parallel to the pole(s) and then turning 180 degrees around one end of each pole and weaving in and out around cones x 2 each direction with CGA; pt with no instability today    Therapeutic Activities: 30 min (2 units)  -5xSTS x 3 from standard-height chair with SBA; pt's times were 10.3 sec,10.0 sec, and 10.2 sec - pt reported slight SOB, SpO2 = 96%  -Standing up from chair, walking 3 meters, turning around, walking back to chair, and sitting down (as in TUG test), 1 x 3 with CGA with no assistive device; pt's times were 12.4 sec, 12.2 sec, and 11.0 sec  -Performing TUG with secondary cognitive task (counting backward by 3s from 97); pt had difficulty performing the 2 tasks simultaneously - TUG time increased to 14.3 sec (20.2% increase)  -Walking around clinic under dual-task conditions (secondary tasks of category naming, sorting through a deck of cards to find all of the clubs); pt had difficulty performing the 2 tasks simultaneously  -Walking outdoors on paved level surfaces for 6 minutes continuously with SBA using SPC in R hand (during ); pt walked 1147' (349.6 meters), HR = 86, SpO2 = 98%        FROM INITIAL EVALUATION:  Special Cerebellar Tests: (assessed on 10/14/21)  F-N-F: pt performed slowly, with mild dysmetria Nystagmus: NT  Head Thrust: (assessed on 11/11/21) = negative bilaterally  DVA: NT    Tests for BPPV  Dix-Hallpike: (assessed on 11/11/21) = negative bilaterally  Roll Test: (assessed on 11/11/21) = negative bilaterally (with no nystagmus noted), although pt reported slight queasiness    Cognitive Status/Communication: Pt is alert, oriented, and cooperative. She communicates clearly and is able to follow instructions without difficulty. On 11/11/21, pt had difficulty remembering and following instructions for  more complex tasks. She also did not recall activities she had performed at previous visits.    Posture/Observations:   Unremarkable    Sensation:   UE Sensation/Dermatomes:   Motion Right Left   C4 intact intact   C5 intact intact   C6 intact intact   C7 intact intact   C8 intact intact   T1 intact intact     LE Proprioception (assessed on 10/14/21): Absent at the great toe but intact at the ankle on the L, intact at the great toe and proximally on the R    Reflexes:     Right  Left Comments   Brachioradialis (C5-6) WNL WNL     Biceps (C5-6) WNL WNL               Patellar tendon (L3,L4) WNL WNL     Achilles tendon (S1,S2) WNL WNL               Hoffman's  negative negative       Clonus: Negative; bilateral    Range of Motion/Flexibility:      Spine Range of Motion/Flexibility:    0-24%   range available 25-49%   range available 50-74% range available  75+%   range available Comments   Flexion x           Extension x           R Rotation x           L Rotation x           R Side bend x           L Side bend x              LE ROM is WFL bilaterally.      Strength/MMT:     LE MMT     LE MMT  Left  Right    Hip flex: (L2)  5/5  5/5    Hip abd:  4/5* 5/5*    Hip ext: /5 /5   Knee ext: (L3)  5/5  5/5    Knee flex: (S2)  5/5  5/5    Ankle DF: (L4)  5/5  5/5    Ankle PF: (S1)  4/5*  4/5*    *assessed on 10/14/21    Functional Mobility:  Pt is independent in all bed mobility and transfers. Pt is able to come to standing from standard chair independently without use of UEs for support.      Balance:   Pt able to maintain balance in sitting and in typical stance position independently without UE support. Able to maintain feet together and semi-tandem stance positions for >20 sec without UE support.    Gait Analysis:  decreased gait speed  decreased B step length  wide BOS    Pt ambulates independently with or without SPC in R hand.      Functional Test/Outcome Measures:  5XSTS: 17 sec    TUG (no assistive device): 23 sec; on 11/18/21 = 13.6 sec (mean of 3 trials)    Functional Gait Assessment (FGA): on 10/14/21 = 17/30 (-2 points each for gait speed, gait with horizontal head turns, gait with vertical head turns, gait with EC, and backward walking; -1 point each for gait with pivot turn, obstacle negotiation, and stair negotiation)    ABC Scale: on 10/14/21 = 39.4%    Dizziness Handicap Inventory (DHI): /100    FSST: on 11/11/21 = 15.4 sec  Tandem Stance: 10 sec with L foot forward, 15 sec with R foot forward; on 11/18/21 = >30 sec with each foot in front    SLS: on 11/11/21 = >30 sec on the L and 28.6 sec on the R    Modified CTSIB: on 11/11/21  Firm surface, feet together with EO: >30 sec with min sway  Firm surface, feet together with EC: >30 sec with mod sway  Foam surface, feet together with EO: >30 sec with mod sway  Foam surface, feet together with EC: >30 sec with severe sway    (comfortable gait speed with no assistive device): on 11/11/21, mean of 2 trials = 0.72 m/sec; # of steps = 20    (fast gait speed with no assistive device): on 11/11/21, mean of 2 trials = 0.98 m/sec; # of steps = 18 on T1, 17 on T2    (using SPC): on 11/18/21 = 1027' (313.0 meters)    Education Provided: safety recommendations; therapist talked with pt about making wide arc turns instead of trying to pivot in one spot   Education results: Verbalized understanding, demonstrated understanding       Current HEP: STS 3x8; tandem stance 3x3 with each foot in front; B heel raises in standing 2 x 20 (added 11/18/21); B hip ABD in standing 2 x 10 (added 11/18/21)    Pt was given handout with instructions for and photos of updated HEP (from HEP2go.com).   my-exercise-[email or web address] code: [id number]    Total Time: 9      I attest that I have reviewed the above information.  Signed: Sedalia Muta, PT  11/25/2021 4:40 PM

## 2021-12-03 ENCOUNTER — Ambulatory Visit: Admit: 2021-12-03 | Discharge: 2021-12-04 | Payer: MEDICARE

## 2021-12-03 NOTE — Unmapped (Signed)
LARGE VOLUME LUMBAR PUNCTURE   PHYSICAL THERAPY EVALUATION    Patient Name: Christy Buchanan  Date of Birth:1955-09-05  Date: 12/03/2021  Session Number:  1  Therapy Diagnosis:   Encounter Diagnoses   Name Primary?    NPH (normal pressure hydrocephalus) (CMS-HCC)     Impaired functional mobility, balance, gait, and endurance Yes    Recurrent falls     Decreased coordination     Dizziness      Referring Pracitioner: Francisca December     Date of Evaluation: 12/03/2021  Referring Pracitioner: Francisca December     Assessment     Reason for Referral/History of Present Condition/Onset of injury/exacerbation:   66 y.o. female presents to the [facility] with 1-year history of recurrent falls. Patient underwent a large volume lumbar puncture and per protocol requires functional assessment 6 hours post-procedure.    Summary:    Functional Tests and Standardized Outcome Measures:     Tests Pre-LP Score (11/25/21) Post-LP Score (10/25)   5xSTS 10.1 seconds 8.5 seconds   TUG 11.9 seconds 9.1 seconds   349 meters 426 meters     Interpretation of results:  Post procedure assessment, patient demonstrated decreased fall risk as compared to previous standardized outcome measures completed on 11/25/2021. The patient demonstrated functional improvement according to all standardized outcome measures completed today. Patient also subjectively reports significant improvement in symptoms during the 6 hour follow-up assessment.    Subjective     Pt reports: feelings like no longer in a fog, head feels clearer, daughter reports sounding more clear, feels overall more hopeful, no dizziness but less shuffling. No reports pain or headache.    Last Fall: none  Precautions: none  Red Flags:  none    Pain  Current:  0/10   Max:  0/10  Least:  0/10  Nature of pain:    Pain Frequency:   Pain Aggravated By:   Pain Relieved By:     Patient???s communication preference: Verbal, Written, and Visual    Barriers to Learning: none      Objective       Five Time Sit to Stand Test (5xSTS)    Patient was able to complete 5 repeated sit to stand transfers without use of upper extremity support in 8.51 seconds.    Score of more than 15 seconds may identify high risk of falls    Norms by age  - 71-59: 7.1 seconds   - 60-69: 8.1 seconds  - 70-79: 10 seconds   - 80-89: 10.6 seconds     Minimal clinically important difference: more than 2.3 seconds        Timed Up and Go (TUG)    PURPOSE: To assess mobility and ambulation as an indicator of fall risk    SCORING: An older adult who takes > 12 seconds to complete the TUG is at increased risk for falling    OBSERVATIONS: The following indicators may signify need for further evaluation or increased fall risk.   Slow, tentative pace    Loss of balance    Short strides    Little or no arm swing    Steady self on walls    Shuffling    En bloc turning    Not using assistive device properly     Patient Score: 9.19 seconds    Comments: Pt was able to follow instructions to complete assessment with no assistive device.       Test was  completed on a level surface. Patient required no Assistive Device.   Total Distance Avg BORG Scale Device   1378ft   433m 2/10 None     Termination of full test due to : No Termination Clinically Indicated    Normative Range for WOMEN: distance = (2.11 ?? height cm[162]) - (2.29 ?? weight kg[57]) - (5.78 ?? age[67]) + 667  = 414m      Patient Education: Throughout evaluation patient educated regarding the following: Role of PT in Rehabilitation. Patient demonstrated and verbalized agreement and understanding.        Communication/consultation with other professionals:  Initial note sent to referring practitioner     Total Time: 30 min   PT Evaluation:30 min     I attest that I have reviewed the above information.  Signed: Luanna Salk Kent Riendeau, PT, PT, DPT  12/03/2021 1:51 PM

## 2021-12-03 NOTE — Unmapped (Signed)
[facility] NEUROLOGY [address] [place]  OUTPATIENT SPEECH PATHOLOGY  12/03/2021             Patient Name: Christy Buchanan  Date of Birth:[birth year]  Session Number: 1  Diagnosis:   Encounter Diagnoses   Name Primary?    Cognitive communication deficit Yes    NPH (normal pressure hydrocephalus) (CMS-HCC)         Date of Evaluation: 12/03/21     Referred by: Francisca December, MD  Reason for Referral: Evaluation Speech, Language, Voice, Cognition           ASSESSMENT:  Pt was seen today for post-LP assessment of cognitive-communication functioning. Per the results of the Cognistat, pt presents with overall average cognitive-communication skills. Pt scored in the average range across all subtests. Pt demonstrated significant improvement in memory, increasing from 5/12 to 10/12. She additionally improved in calculations, increasing from 2/4 to 4/4. Subjectively, pt demonstrated improved processing speed when answering questions. Pt reports significant functional improvement in cognition since her procedure.             Treatment Recommendations: N/A         PLAN:    for   N/A            Prognosis:   N/A                  Goals:    N/A        SUBJECTIVE:  Pt is a 66 year old female with history of breast cancer, Grave's disease, Sjogren's syndrome, and myeloproliferative neoplasm (MPN) and possible NPH seen for a speech-language evaluation for post-LP testing. Patient underwent a large volume lumbar puncture this morning (12/03/21). She was seen for pre-LP assessment on 11/12/21. Pt reports, I feel like I am back and reports significant cognitive improvement since her procedue.    Communication Preference: Verbal, Written         Barriers to Learning: No Barriers                       Education Level: Airline pilot patient receives: PT         Prior treatment for referral reason: No                  Pain?: No      Precautions: None         Prior Function: Independent, Part time employment, Active driver prior to admission    Prior Function Comments: Pt works part time as a Chiropractor With: Alone    Past Medical History:   Diagnosis Date    Autoimmune thyroiditis     Breast cancer (CMS-HCC)     GERD (gastroesophageal reflux disease)     Hyperlipidemia     Osteoporosis     Sjogren's disease (CMS-HCC)     Thrombocythemia     Vitamin D deficiency       Family History   Problem Relation Age of Onset    Cancer Mother     Heart disease Mother     Cancer Father     Breast cancer Neg Hx      Past Surgical History:   Procedure Laterality Date  AUGMENTATION MAMMAPLASTY Bilateral     23ish yrs    BREAST BIOPSY Left     malignant    CHEMOTHERAPY      MASTECTOMY Left 25 ish yrs ago    RADIATION Left     No Known Allergies  Social History     Tobacco Use    Smoking status: Former     Types: Cigarettes    Smokeless tobacco: Never   Substance Use Topics    Alcohol use: Yes     Comment: rare      Current Outpatient Medications   Medication Sig Dispense Refill    aspirin (ECOTRIN) 81 MG tablet Take 1 tablet (81 mg total) by mouth.      atorvastatin (LIPITOR) 40 MG tablet Take 1 tablet (40 mg total) by mouth daily. 30 tablet 11    cetirizine (ZYRTEC) 10 MG tablet Take 1 tablet (10 mg total) by mouth every other day.      citalopram (CELEXA) 10 MG tablet Take 3 tablets (30 mg total) by mouth Three (3) times a day before meals. 90 tablet 0    meclizine (ANTIVERT) 12.5 mg tablet Take 1 tablet (12.5 mg total) by mouth Three (3) times a day as needed for dizziness or nausea. 30 tablet 1    multivitamin (TAB-A-VITE/THERAGRAN) per tablet Take 1 tablet by mouth daily.      omega-3 fatty acids-fish oil 300-1,000 mg cap capsule Take 1 capsule by mouth in the morning.      omeprazole (PRILOSEC) 20 MG capsule Take 1 capsule (20 mg total) by mouth daily. TAKE 1 CAPSULE(20 MG) BY MOUTH DAILY 90 capsule 2    raloxifene (EVISTA) 60 mg tablet Take 1 tablet (60 mg total) by mouth daily. 90 tablet 3    ruxolitinib (JAKAFI) 10 mg tablet Take 1 tablet (10 mg total) by mouth Two (2) times a day. 60 tablet 11     No current facility-administered medications for this visit.     Facility-Administered Medications Ordered in Other Visits   Medication Dose Route Frequency Provider Last Rate Last Admin    LORazepam (ATIVAN) injection 1 mg  1 mg Intravenous Once Francisca December, MD             OBJECTIVE          Cognition  Orientation Level: Oriented x 4  Attention: Attention is within functional limits  Memory: Short term memory is within functional limits, Long term memory is within functional limits  Problem Solving: Problem solving is within functional limits  Safety/Judgement: Safety/Judgement is within functional limits  Insight/Mental Flexibility/Initiation/Processing: Insight is within functional limits, Processing speed is within functional limits         The Cognistat is a cognitive measure that assesses the five major ability areas: language, spatial skills, memory, calculations and reasoning. It has been standardized for adults and seniors in three age groups: (60-64, 84-74 and 16-84).      Subtest Score Interpretation   Orientation 12/12 Average   Attention 8/8 Average   Registration 2/2 Average   Comprehension 6/6 Average   Repetition 12/12 Average   Naming 8/8 Average   Construction 5/6 Average   Memory 10/12 Average   Calculation 4/4 Average   Similarities 8/8 Average   Judgement 6/6 Average      From pre-LP assessment on 11/12/21:   Subtest Score Interpretation   Orientation 10/12 Average   Attention 8/8 Average   Registration 2/2 Average   Comprehension 6/6 Average  Repetition 11/12 Average   Naming 8/8 Average   Construction 4/6 Average   Memory 5/12 Moderate-severe   Calculation 2/4 Mild   Similarities 8/8 Average   Judgement 6/6 Average                             Session Duration : 25    Today's Charges (noted here with $$):     SLP Evaluation Charges  $$ 92523-Speech Sound Lang (Comp/Exp) [mins]: 25                 I attest that I have reviewed the above information.  Signed: Donalda Ewings, SLP  12/03/2021 3:37 PM

## 2021-12-04 NOTE — Unmapped (Addendum)
LVM & recall placed for scheduling follow up appt with Dr. Barron Alvine    ----- Message from Thomasena Edis, RN sent at 12/04/2021  1:42 PM EDT -----  Please schedule with Ewend's next available.    Thanks!  Dewayne Hatch

## 2021-12-11 NOTE — Unmapped (Signed)
[facility] Clinical Assessment & Refill Coordination Note    Christy Buchanan, DOB: 10/19/1955  Phone: [phone] (home)     All above HIPAA information was verified with patient.     Was a Nurse, learning disability used for this call? No    Specialty Medication(s):   Hematology/Oncology: [id number]     Current Outpatient Medications   Medication Sig Dispense Refill    aspirin (ECOTRIN) 81 MG tablet Take 1 tablet (81 mg total) by mouth.      atorvastatin (LIPITOR) 40 MG tablet Take 1 tablet (40 mg total) by mouth daily. 30 tablet 11    cetirizine (ZYRTEC) 10 MG tablet Take 1 tablet (10 mg total) by mouth every other day.      citalopram (CELEXA) 10 MG tablet Take 3 tablets (30 mg total) by mouth Three (3) times a day before meals. 90 tablet 0    meclizine (ANTIVERT) 12.5 mg tablet Take 1 tablet (12.5 mg total) by mouth Three (3) times a day as needed for dizziness or nausea. 30 tablet 1    multivitamin (TAB-A-VITE/THERAGRAN) per tablet Take 1 tablet by mouth daily.      omega-3 fatty acids-fish oil 300-1,000 mg cap capsule Take 1 capsule by mouth in the morning.      omeprazole (PRILOSEC) 20 MG capsule Take 1 capsule (20 mg total) by mouth daily. TAKE 1 CAPSULE(20 MG) BY MOUTH DAILY 90 capsule 2    raloxifene (EVISTA) 60 mg tablet Take 1 tablet (60 mg total) by mouth daily. 90 tablet 3    ruxolitinib (JAKAFI) 10 mg tablet Take 1 tablet (10 mg total) by mouth Two (2) times a day. 60 tablet 11     No current facility-administered medications for this visit.        Changes to medications: Christy Buchanan reports no changes at this time.    No Known Allergies    Changes to allergies: No    SPECIALTY MEDICATION ADHERENCE     Jakafi 10 mg: 14 days of medicine on hand     Medication Adherence    Patient reported X missed doses in the last month: 1  Specialty Medication: Jakafi 10 mg - 1 tab (10 mg) twice daily  Patient is on additional specialty medications: No  Informant: patient                  Confirmed plan for next specialty medication refill: delivery by pharmacy  Refills needed for supportive medications: not needed        Specialty medication(s) dose(s) confirmed: Regimen is correct and unchanged.     Are there any concerns with adherence?  Missed one dose    Adherence counseling provided? Not needed    CLINICAL MANAGEMENT AND INTERVENTION      Clinical Benefit Assessment:    Do you feel the medicine is effective or helping your condition? Yes    Clinical Benefit counseling provided? Not needed    Adverse Effects Assessment:    Are you experiencing any side effects? No    Are you experiencing difficulty administering your medicine? No    Quality of Life Assessment:    Quality of Life    Rheumatology  Oncology  Dermatology  Cystic Fibrosis          How many days over the past month did your Essential thrombocythemia  keep you from your normal activities? For example, brushing your teeth or getting up in the morning. 0  Have you discussed this with your provider? Not needed    Acute Infection Status:    Acute infections noted within Epic:  No active infections  Patient reported infection: None    Therapy Appropriateness:    Is therapy appropriate and patient progressing towards therapeutic goals? Yes, therapy is appropriate and should be continued    DISEASE/MEDICATION-SPECIFIC INFORMATION      N/A    Oncology: Is the patient receiving adequate infection prevention treatment? Not applicable  Does the patient have adequate nutritional support? Not applicable    PATIENT SPECIFIC NEEDS     Does the patient have any physical, cognitive, or cultural barriers? No    Is the patient high risk? No    Did the patient require a clinical intervention? No    Does the patient require physician intervention or other additional services (i.e., nutrition, smoking cessation, social work)? No    SOCIAL DETERMINANTS OF HEALTH     At the [facility], we have learned that life circumstances - like trouble affording food, housing, utilities, or transportation can affect the health of many of our patients.   That is why we wanted to ask: are you currently experiencing any life circumstances that are negatively impacting your health and/or quality of life? Patient declined to answer    Social Determinants of Health     Financial Resource Strain: Medium Risk (08/20/2021)    Overall Financial Resource Strain (CARDIA)     Difficulty of Paying Living Expenses: Somewhat hard   Internet Connectivity: Not on file   Food Insecurity: No Food Insecurity (08/20/2021)    Hunger Vital Sign     Worried About Running Out of Food in the Last Year: Never true     Ran Out of Food in the Last Year: Never true   Tobacco Use: Medium Risk (12/03/2021)    Patient History     Smoking Tobacco Use: Former     Smokeless Tobacco Use: Never     Passive Exposure: Not on file   Housing/Utilities: Low Risk  (08/20/2021)    Housing/Utilities     Within the past 12 months, have you ever stayed: outside, in a car, in a tent, in an overnight shelter, or temporarily in someone else's home (i.e. couch-surfing)?: No     Are you worried about losing your housing?: No     Within the past 12 months, have you been unable to get utilities (heat, electricity) when it was really needed?: No   Alcohol Use: Not on file   Transportation Needs: No Transportation Needs (08/20/2021)    PRAPARE - Therapist, art (Medical): No     Lack of Transportation (Non-Medical): No   Substance Use: Not on file   Health Literacy: Not on file   Physical Activity: Not on file   Interpersonal Safety: Not on file   Stress: Not on file   Intimate Partner Violence: Not on file   Depression: Not on file   Social Connections: Not on file     Would you be willing to receive help with any of the needs that you have identified today? Not applicable     SHIPPING     Specialty Medication(s) to be Shipped:   Hematology/Oncology: Christy Buchanan    Other medication(s) to be shipped: No additional medications requested for fill at this time     Changes to insurance: No    Delivery Scheduled: Yes, Expected medication delivery date: 12/23/21.  Medication will be delivered via Next Day Courier to the confirmed prescription address in [facility].    The patient will receive a drug information handout for each medication shipped and additional FDA Medication Guides as required.  Verified that patient has previously received a Conservation officer, historic buildings and a Surveyor, mining.    The patient or caregiver noted above participated in the development of this care plan and knows that they can request review of or adjustments to the care plan at any time.      All of the patient's questions and concerns have been addressed.    Kermit Balo, [facility]   [facility] Specialty Pharmacist

## 2021-12-14 NOTE — Unmapped (Signed)
Neurosurgery Clinic Note - Return Patient    [facility] Provider:  Rachelle Hora, MD FACS  Patient Name: Christy Buchanan  ____________________________________________________  Assessment/Plan:   Christy Buchanan is a 66 y.o. female who presents to clinic today after LP trial x 12/03/21 to discuss surgical options for NPH. She did very well after the LP and is back to her baseline after the drainage, would like to move forward with treatment.   We discussed surgical treatment options including placement of a VP shunt - surgical risks and details were discussed at length with the patient including surgery length, post-op pain medications, wound care, and recovery time.  Patient amenable to treatment plan. All of her questions were answered in-detail today.  She will need to hold her blood thinners for one week prior to surgery, she is aware of this.       _____________________________________________________    History of Present Illness:  Christy Buchanan is a 66 y.o. female with a past medical history most prominent for NPH, breast cancer, Grave's disease, Sjogren's syndrome, and myeloproliferative neoplasm (MPN).     Patient has a 1 year hx of recurrent falls, dizziness, and decreased coordination with it primarily affecting the left side (feels like she leans left with ambulation).  She notes that her symptoms have become more progressive over the last 6 months.    She recently completed pre and post-LP assessment - 11/12/21 and 12/03/21 respectively; results revealed significant improvement in cognition and various topics (math, memory, etc.). She states she was able to walk normally and was very pleased to feel like her self again.   Patient feels like she has reverted to her prior symptoms - brain fogginess and dizziness. She is interested in pursuing surgery for placement of a shunt. She has MPN and was able to hold ASA prior to her LP without issues.       Past Medical History:  Past Medical History:   Diagnosis Date   ??? Autoimmune thyroiditis    ??? Breast cancer (CMS-HCC)    ??? GERD (gastroesophageal reflux disease)    ??? Hyperlipidemia    ??? Osteoporosis    ??? Sjogren's disease (CMS-HCC)    ??? Thrombocythemia    ??? Vitamin D deficiency      Past medical and surgical history plus medication and allergies reviewed in [email or web address]     Social History:  Unaccompanied today    Physical Exam:  General: No acute distress. Pt sitting comfortably in exam room.  A & O x3. Naming intact, speech fluent.  Good historian for recent and distant events in medical history.  Follows commands easily    Test Results  Tests listed below were personally reviewed by me.  No new imaging for review today     Detailed Problem List:  Patient Active Problem List   Diagnosis   ??? Sleep disorder   ??? Abnormal Pap smear of cervix   ??? Family history of colon cancer   ??? Family history of thyroid cancer   ??? Gastroesophageal reflux disease   ??? History of breast cancer   ??? Mixed hyperlipidemia   ??? Postmenopausal osteoporosis   ??? Pure hypercholesterolemia   ??? Sjogrens syndrome (CMS-HCC)   ??? Vitamin D deficiency   ??? Depression, major, single episode, complete remission (CMS-HCC)   ??? Essential thrombocythemia (CMS-HCC)   ??? Acquired hypothyroidism   ??? JAK-2 gene mutation   ??? MPN (myeloproliferative neoplasm) (CMS-HCC)   ??? Loss of consciousness (CMS-HCC)   ???  NPH (normal pressure hydrocephalus) (CMS-HCC)      Scribe's Attestation: Barnie Alderman, NP obtained and performed the history, physical exam and medical decision making elements that were entered into the chart.  Signed by Jiles Harold, Scribe, on December 15, 2021 at 9:40 AM.    ----------------------------------------------------------------------------------------------------------------------  December 15, 2021 11:28 AM. Documentation assistance provided by the Scribe. I was present during the time the encounter was recorded. The information recorded by the Scribe was done at my direction and has been reviewed and validated by me.  ----------------------------------------------------------------------------------------------------------------------

## 2021-12-15 ENCOUNTER — Ambulatory Visit: Admit: 2021-12-15 | Discharge: 2021-12-15 | Payer: MEDICARE

## 2021-12-15 DIAGNOSIS — G9689 Other specified disorders of central nervous system: Principal | ICD-10-CM

## 2021-12-15 DIAGNOSIS — G912 (Idiopathic) normal pressure hydrocephalus: Principal | ICD-10-CM

## 2021-12-15 DIAGNOSIS — R29700 NIHSS score 0: Principal | ICD-10-CM

## 2021-12-15 DIAGNOSIS — D420 Neoplasm of uncertain behavior of cerebral meninges: Principal | ICD-10-CM

## 2021-12-15 DIAGNOSIS — Z419 Encounter for procedure for purposes other than remedying health state, unspecified: Principal | ICD-10-CM

## 2021-12-15 LAB — CBC W/ AUTO DIFF
BASOPHILS ABSOLUTE COUNT: 0 10*9/L (ref 0.0–0.1)
BASOPHILS RELATIVE PERCENT: 0.6 %
EOSINOPHILS ABSOLUTE COUNT: 0.1 10*9/L (ref 0.0–0.5)
EOSINOPHILS RELATIVE PERCENT: 1.1 %
HEMATOCRIT: 31.4 % — ABNORMAL LOW (ref 34.0–44.0)
HEMOGLOBIN: 10.8 g/dL — ABNORMAL LOW (ref 11.3–14.9)
LYMPHOCYTES ABSOLUTE COUNT: 1.6 10*9/L (ref 1.1–3.6)
LYMPHOCYTES RELATIVE PERCENT: 24.8 %
MEAN CORPUSCULAR HEMOGLOBIN CONC: 34.4 g/dL (ref 32.0–36.0)
MEAN CORPUSCULAR HEMOGLOBIN: 31.4 pg (ref 25.9–32.4)
MEAN CORPUSCULAR VOLUME: 91.3 fL (ref 77.6–95.7)
MEAN PLATELET VOLUME: 7.9 fL (ref 6.8–10.7)
MONOCYTES ABSOLUTE COUNT: 0.6 10*9/L (ref 0.3–0.8)
MONOCYTES RELATIVE PERCENT: 9 %
NEUTROPHILS ABSOLUTE COUNT: 4.2 10*9/L (ref 1.8–7.8)
NEUTROPHILS RELATIVE PERCENT: 64.5 %
PLATELET COUNT: 571 10*9/L — ABNORMAL HIGH (ref 150–450)
RED BLOOD CELL COUNT: 3.44 10*12/L — ABNORMAL LOW (ref 3.95–5.13)
RED CELL DISTRIBUTION WIDTH: 16.1 % — ABNORMAL HIGH (ref 12.2–15.2)
WBC ADJUSTED: 6.4 10*9/L (ref 3.6–11.2)

## 2021-12-15 LAB — COMPREHENSIVE METABOLIC PANEL
ALBUMIN: 3.8 g/dL (ref 3.4–5.0)
ALKALINE PHOSPHATASE: 81 U/L (ref 46–116)
ALT (SGPT): 23 U/L (ref 10–49)
ANION GAP: 8 mmol/L (ref 5–14)
AST (SGOT): 28 U/L (ref <=34)
BILIRUBIN TOTAL: 0.6 mg/dL (ref 0.3–1.2)
BLOOD UREA NITROGEN: 14 mg/dL (ref 9–23)
BUN / CREAT RATIO: 15
CALCIUM: 9 mg/dL (ref 8.7–10.4)
CHLORIDE: 106 mmol/L (ref 98–107)
CO2: 26 mmol/L (ref 20.0–31.0)
CREATININE: 0.91 mg/dL
EGFR CKD-EPI (2021) FEMALE: 69 mL/min/{1.73_m2} (ref >=60)
GLUCOSE RANDOM: 92 mg/dL (ref 70–179)
POTASSIUM: 4.4 mmol/L (ref 3.4–4.8)
PROTEIN TOTAL: 7.2 g/dL (ref 5.7–8.2)
SODIUM: 140 mmol/L (ref 135–145)

## 2021-12-15 LAB — APTT
APTT: 30.8 s (ref 24.8–38.4)
HEPARIN CORRELATION: 0.2

## 2021-12-15 LAB — PROTIME-INR
INR: 1.02
PROTIME: 11.4 s (ref 9.9–12.6)

## 2021-12-15 NOTE — Unmapped (Signed)
We will discuss a surgery date and call you with a pre-op visit close to surgery.     Do not take any aspirin or other aspirin products for at least 10 days  prior to surgery.   This includes medications like BC powder, Goody's powder, Excedrin, or anything containing aspirin.   We prefer you take Tylenol for any pain instead of Ibuprofen until after your surgery.     If you are on any blood thinners, please let us know as soon as possible.  We want to have a clear plan with you for when to stop taking those medicines before surgery.    Christy Buchanan is our surgery scheduler and will be calling you with dates for these appointments. She can be reached at [phone].   Please call if you have any questions prior to these appointments   She should contact you within 72 hours with more information     You can also reach Dr. Barron Buchanan through your nurse navigator, Christy Mouton, RN.     You can reach her at [phone] or fax paperwork to [phone].

## 2021-12-15 NOTE — Unmapped (Signed)
Attending Attestation by Damita Lack, MD:  I was the supervising physician in the delivery of the service by Ms. Thorp.    The patient had a good clinical story for normal pressure hydrocephalus.  We performed our standard lumbar puncture trial with pre and post lumbar puncture assessment.  By her own assessment, she was significantly improved for approximately 2 days.  The therapist had similar findings.     We would consider this to be a positive lumbar puncture trial and we did offer the patient a right frontal ventriculoperitoneal shunt.  I explained the procedure including the risk including infection, brain bleeding, damage to abdominal organs during catheter insertion anesthesia complications and other unexpected medical complications of surgery.    She was significantly better following the lumbar puncture and would like to move forward with ventriculoperitoneal shunting.  I am confident that a thorough informed consenting process has occurred.  I begun looking for a surgical date with my team.    She knows that she must be off aspirin for 7 days prior to surgery    Consent for Operation or Procedure: Provider Certification  I hereby certify that the nature, purpose, benefits, usual and most frequent risks of, and alternatives to, the operation or procedure have been explained to the patient (or person authorized to sign for the patient) either by a physician or by the provider who is to perform the operation or procedure, that the patient has had an opportunity to ask questions, and that those questions have been answered. The patient or the patient's representative has been advised that selected tasks may be performed by assistants to the primary health care provider(s). I believe that the patient (or person authorized to sign for the patient) understands what has been explained, and has consented to the operation or procedure.

## 2021-12-16 ENCOUNTER — Ambulatory Visit
Admit: 2021-12-16 | Payer: MEDICARE | Attending: Rehabilitative and Restorative Service Providers" | Primary: Rehabilitative and Restorative Service Providers"

## 2021-12-16 DIAGNOSIS — K219 Gastro-esophageal reflux disease without esophagitis: Principal | ICD-10-CM

## 2021-12-16 MED ORDER — OMEPRAZOLE 20 MG CAPSULE,DELAYED RELEASE
ORAL_CAPSULE | Freq: Every day | ORAL | 2 refills | 90 days | Status: CP
Start: 2021-12-16 — End: ?

## 2021-12-16 NOTE — Unmapped (Signed)
[facility] HE[facility] SCIENCES PT [facility]  OUTPATIENT PHYSICAL THERAPY  Treatment Note  12/16/2021    Patient Name: Christy Buchanan  Date of Birth:Sep 13, 1955  Session Number: 6  Diagnosis:   Encounter Diagnoses   Name Primary?    Impaired functional mobility, balance, gait, and endurance Yes    Recurrent falls     Decreased coordination     NPH (normal pressure hydrocephalus) (CMS-HCC)        Onset of Symptoms: 10/03/20  Date of Evaluation: 10/03/21  Referring Provider: Madelon Lips, DO  Dates of Certification: 10/07/21 - 01/05/22       Problem List: Impaired balance, Impaired sensation, Fall Risk, Decreased mobility, Decreased coordination, Cognitive/Behavioral impairments, Dizziness/vertigo    ASSESSMENT:    Christy Buchanan is a 66 year old female with a 1-year history of recurrent falls. The pt has mild LE weakness (affecting L hip abductors and B ankle plantarflexors), mild coordination deficits bilaterally (L worse than R), absent proprioception at the L great toe, and balance and gait difficulties. She had positive effects from lumbar puncture trial (for possible NPH) and will be scheduling surgery for a V-P shunt. The pt ambulates independently with or without a SPC.      Today's session focused on dynamic balance and gait activities. The pt completed the this date using her [facility] with a distance of 1425' (434.3 meters), which is a significant improvement from her previous distance of 1147' (349.6 meters) at clinic visit on 11/25/21. Her time on the FSST has improved from 15.4 sec on 11/11/21 to 11.5 sec today. The pt continues to show some deterioration in gait performance (slowing) when asked to walk while simultaneously performing a secondary cognitive task. The pt should continue to benefit from skilled PT services to increase muscle strength, address dizziness/ unsteadiness, improve balance, increase walking capacity and confidence, and decrease risk of falls.           Long Term Goals: (12 weeks)  Long Term Goal 1: The pt will demonstrate independent performance of HEP to maintain functional gains. GOAL MET (11/25/21)         Long Term Goal 2: The pt's TUG time will improve to 12 seconds or less to demonstrate decreased risk for falling.         Long Term Goal 3: The pt will improve in 5x sit-to-stand time to less than 11 seconds to demonstrate improvement in overall function. GOAL MET (11/25/21)         Long Term Goal 4: NEW GOAL (added 10/14/21) Pt's score on the FGA will increase to at least 22/30 to demonstrate meaningful improvement in dynamic balance and decreased risk of falls.         Long Term Goal 5: NEW GOAL (added 10/14/21) Pt's score on the ABC Scale will increase to at least 67% to demonstrate improved balance confidence and decreased risk of falls.         Long Term Goal 6: NEW GOAL (added 11/18/21) Pt's distance will increase to at least 363 meters to demonstrate meaningful improvement in walking capacity. GOAL MET (12/16/21)                     PLAN:    Follow-up / Frequency - Other: 3x/month for Duration: 3 months    Next Visit Plan: progress balance and gait activities, including turning, dual task walking, and ; do TUG and ABC scale    SUBJECTIVE:  The pt says she had a dramatic  improvement in her thinking and memory and felt much more like herself following lumbar puncture, but the improvements started to fade after a couple of days. The pt states that she is anxious to schedule the shunt surgery. This is the only chance I have.    The pt reports no falls since last PT visit.     Most recent reported fall: end of July, 2023       Precautions: Cancer history, Osteoporosis, Fall risk       OBJECTIVE:       Pain: No/denies                                     TODAY'S TREATMENT:  Pt arrived to session using SPC in her R hand and wearing athletic-style footwear.    Therapist wore KN95/N95 mask throughout the session in the context of the COVID-19 pandemic.     All activities performed without SPC unless otherwise noted.    Neuromuscular Re-education: 40 min (3 units)  -Stepping forward, backward, left, right, and diagonally over low obstacles (set up as for Four Square Step Test), multiple reps with CGA; pt's performance improved quickly with practice/repetition, although pt still had mild difficulty with diagonal steps backward  -Walking with horizontal and vertical head turns, 30' x 2 for each direction of head movement with CGA; pt with slight instability during this activity  -Walking with EC, 30' x 2 with CGA; very challenging for pt, so regressed to walking with alternating EO and EC as cued by therapist  -Backward walking 30' x 2 with CGA; pt performed well  -Tandem walking, 15' x 2 with CGA; pt performed well with arms at sides, but had much more difficulty with arms folded across chest  -Forward progression (over approximately 20') x 2 with 180-degree turn as cued by therapist; pt performed well   -Forward progression (over approximately 20') x 2 with 360-degree turn as cued by therapist; pt reported feeling slightly dizzy after 2nd trial, but no gait instability noted     Therapeutic Activities: 15 min (1 unit)  -Walking outdoors on level and sloping paved surfaces under dual-task conditions (secondary tasks of naming things in different categories); pt showed minor slowing of gait when performing the 2 tasks simultaneously  -Walking outdoors on paved level surfaces for 6 minutes continuously with SBA using SPC in R hand (during ); pt walked 1425' (434.3 meters)      FROM INITIAL EVALUATION:  Special Cerebellar Tests: (assessed on 10/14/21)  F-N-F: pt performed slowly, with mild dysmetria bilaterally; slight intention tremor noted RUE; pt completed 5 reps in 12.9 sec on the L and 11.2 sec on the R  RAM (forearm pronation/supination): pt has mild dysdiadochokinesia; completed 5 reps in 5.1 sec on the L and 4.9 sec on the R  H-S (performed in sitting, with heel to knee): mild dysmetria bilaterally; pt completed 5 reps in 7.6 sec on the L and 6.4 sec on the R    Visual/Vestibular Testing: (initially assessed on 10/14/21)  Smooth pursuit: EOMI; WNL  Saccades: WNL  VOR: abnormal - pt states this makes me feel bad; on 11/11/21, pt was able to perform for 60 sec, keeping the target in focus per her report  VOR cancellation: NT  Head Shaking Nystagmus: NT  Head Thrust: (assessed on 11/11/21) = negative bilaterally  DVA: NT    Tests for BPPV  Dix-Hallpike: (assessed  on 11/11/21) = negative bilaterally  Roll Test: (assessed on 11/11/21) = negative bilaterally (with no nystagmus noted), although pt reported slight queasiness    Cognitive Status/Communication: Pt is alert, oriented, and cooperative. She communicates clearly and is able to follow instructions without difficulty. On 11/11/21, pt had difficulty remembering and following instructions for more complex tasks. She also did not recall activities she had performed at previous visits.    Posture/Observations:   Unremarkable    Sensation:   UE Sensation/Dermatomes:   Motion Right Left   C4 intact intact   C5 intact intact   C6 intact intact   C7 intact intact   C8 intact intact   T1 intact intact     LE Proprioception (assessed on 10/14/21): Absent at the great toe but intact at the ankle on the L, intact at the great toe and proximally on the R    Reflexes:     Right  Left Comments   Brachioradialis (C5-6) WNL WNL     Biceps (C5-6) WNL WNL               Patellar tendon (L3,L4) WNL WNL     Achilles tendon (S1,S2) WNL WNL               Hoffman's  negative negative       Clonus: Negative; bilateral    Range of Motion/Flexibility:      Spine Range of Motion/Flexibility:    0-24%   range available 25-49%   range available 50-74% range available  75+%   range available Comments   Flexion x           Extension x           R Rotation x           L Rotation x           R Side bend x           L Side bend x              LE ROM is WFL bilaterally.      Strength/MMT:     LE MMT     LE MMT  Left  Right    Hip flex: (L2)  5/5  5/5    Hip abd:  4/5* 5/5*    Hip ext: /5 /5   Knee ext: (L3)  5/5  5/5    Knee flex: (S2)  5/5  5/5    Ankle DF: (L4)  5/5  5/5    Ankle PF: (S1)  4/5*  4/5*    *assessed on 10/14/21    Functional Mobility:  Pt is independent in all bed mobility and transfers. Pt is able to come to standing from standard chair independently without use of UEs for support.      Balance:   Pt able to maintain balance in sitting and in typical stance position independently without UE support. Able to maintain feet together and semi-tandem stance positions for >20 sec without UE support.    Gait Analysis:  decreased gait speed  decreased B step length  wide BOS    Pt ambulates independently with or without SPC in R hand.      Functional Test/Outcome Measures:  5XSTS: 17 sec; on 11/25/21 = 10.1 sec (mean of 2 trials)    TUG (no assistive device): 23 sec; on 11/18/21 = 13.6 sec (mean of 3 trials); on 11/25/21 = 11.9 sec (mean of 3 trials)  Functional Gait Assessment (FGA): on 10/14/21 = 17/30 (-2 points each for gait speed, gait with horizontal head turns, gait with vertical head turns, gait with EC, and backward walking; -1 point each for gait with pivot turn, obstacle negotiation, and stair negotiation)    ABC Scale: on 10/14/21 = 39.4%    Dizziness Handicap Inventory [facility]): /100    FSST: on 11/11/21 = 15.4 sec; on 12/16/21 = 11.5 sec    Tandem Stance: 10 sec with L foot forward, 15 sec with R foot forward; on 11/18/21 = >30 sec with each foot in front    SLS: on 11/11/21 = >30 sec on the L and 28.6 sec on the R    Modified CTSIB: on 11/11/21  Firm surface, feet together with EO: >30 sec with min sway  Firm surface, feet together with EC: >30 sec with mod sway  Foam surface, feet together with EO: >30 sec with mod sway  Foam surface, feet together with EC: >30 sec with severe sway    (comfortable gait speed with no assistive device): on 11/11/21, mean of 2 trials = 0.72 m/sec; # of steps = 20    (fast gait speed with no assistive device): on 11/11/21, mean of 2 trials = 0.98 m/sec; # of steps = 18 on T1, 17 on T2    (using SPC): on 11/18/21 = 1027' (313.0 meters); on 11/25/21 = 1147' (349.6 meters); on 12/16/21 = 1425' (434.3 meters)    Education Provided: symptom management, safety recommendations;   Education results: Verbalized understanding, demonstrated understanding       Current HEP: STS 3x8; tandem stance 3x3 with each foot in front; B heel raises in standing 2 x 20 (added 11/18/21); B hip ABD in standing 2 x 10 (added 11/18/21)    [email or web address] code: [id number]    Total Time: 49      I attest that I have reviewed the above information.  Signed: Sedalia Muta, PT  12/16/2021 12:37 PM

## 2021-12-16 NOTE — Unmapped (Signed)
Posted 11/21 surgery per Ewend, left vm for patient to confirm

## 2021-12-17 NOTE — Unmapped (Addendum)
Blood Bank Immunohematology Evaluation    Christy Buchanan types as O POS and has no known history of red cell antibodies. 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     This alloantibody is usually a consequence of pregnancy and/or previous transfusion and can cause a hemolytic transfusion reaction. If transfusion is necessary, she will receive extended crossmatch compatible K-antigen negative units.  Approximately 91% of donor units in the Blood Bank lack this antigen.     Should this patient require future transfusions, please allow additional time to identify and manually crossmatch Red Blood Cell units negative for the K antigen.     Margorie John, MD  Anatomic & Clinical Pathology Resident    Transfusion Medicine Attending Attestation:  I reviewed the patient's chart and transfusion medicine laboratory evaluation.  I agree with the above findings, which are consistent with anti-K, and with the resident's recommendations.      Silas Sacramento, MD MS  [facility] Transfusion Medicine Service

## 2021-12-22 MED FILL — JAKAFI 10 MG TABLET: ORAL | 30 days supply | Qty: 60 | Fill #3

## 2021-12-23 NOTE — Unmapped (Signed)
RN attempted to reach patient re: pre-op instructions, left VM. Instructed patient to stop taking Aspirin, fish oil, and multivitamin per her medication list, as well as any PRN Ibuprofen/Aleve. RN will attempt to call again tomorrow.

## 2021-12-29 NOTE — Unmapped (Signed)
VM re: patient would like prescription for Xanax and Zofran prior to procedure. Would like a call back.

## 2021-12-30 ENCOUNTER — Encounter: Admit: 2021-12-30 | Discharge: 2021-12-31 | Disposition: A | Discharge status: Home / Self Care | Payer: MEDICARE

## 2021-12-30 ENCOUNTER — Ambulatory Visit: Admit: 2021-12-30 | Discharge: 2021-12-31 | Disposition: A | Discharge status: Home / Self Care | Payer: MEDICARE

## 2021-12-30 MED ADMIN — pantoprazole (Protonix) EC tablet 20 mg: 20 mg | ORAL | @ 17:00:00

## 2021-12-30 MED ADMIN — docusate sodium (COLACE) capsule 100 mg: 100 mg | ORAL | @ 17:00:00

## 2021-12-30 MED ADMIN — lidocaine (XYLOCAINE) 20 mg/mL (2 %) injection: INTRAVENOUS | @ 14:00:00 | Stop: 2021-12-30

## 2021-12-30 MED ADMIN — fentaNYL (PF) (SUBLIMAZE) injection: INTRAVENOUS | @ 14:00:00 | Stop: 2021-12-30

## 2021-12-30 MED ADMIN — sugammadex (BRIDION) injection: INTRAVENOUS | @ 15:00:00 | Stop: 2021-12-30

## 2021-12-30 MED ADMIN — acetaminophen (TYLENOL) tablet 650 mg: 650 mg | ORAL | @ 11:00:00 | Stop: 2021-12-30

## 2021-12-30 MED ADMIN — lactated Ringers infusion: INTRAVENOUS | @ 12:00:00 | Stop: 2021-12-30

## 2021-12-30 MED ADMIN — atorvastatin (LIPITOR) tablet 40 mg: 40 mg | ORAL | @ 17:00:00

## 2021-12-30 MED ADMIN — lidocaine (XYLOCAINE) 20 mg/mL (2 %) injection: INTRAVENOUS | @ 13:00:00 | Stop: 2021-12-30

## 2021-12-30 MED ADMIN — acetaminophen (TYLENOL) tablet 650 mg: 650 mg | ORAL | @ 17:00:00

## 2021-12-30 MED ADMIN — ROCuronium (ZEMURON) injection: INTRAVENOUS | @ 14:00:00 | Stop: 2021-12-30

## 2021-12-30 MED ADMIN — ceFAZolin in 50ml iso-osmotic dextrose (ANCEF) 2 gram/50 mL IVPB: INTRAVENOUS | @ 13:00:00 | Stop: 2021-12-30

## 2021-12-30 MED ADMIN — propofol (DIPRIVAN) infusion 10 mg/mL: INTRAVENOUS | @ 13:00:00 | Stop: 2021-12-30

## 2021-12-30 MED ADMIN — multivitamins, therapeutic with minerals tablet 1 tablet: 1 | ORAL | @ 17:00:00

## 2021-12-30 MED ADMIN — ondansetron (ZOFRAN) injection: INTRAVENOUS | @ 14:00:00 | Stop: 2021-12-30

## 2021-12-30 MED ADMIN — citalopram (CeleXA) tablet 10 mg: 10 mg | ORAL | @ 17:00:00

## 2021-12-30 MED ADMIN — ROCuronium (ZEMURON) injection: INTRAVENOUS | @ 13:00:00 | Stop: 2021-12-30

## 2021-12-30 MED ADMIN — dexAMETHasone (DECADRON) 4 mg/mL injection: INTRAVENOUS | @ 13:00:00 | Stop: 2021-12-30

## 2021-12-30 MED ADMIN — thrombin (recombinant) (RECOTHROM) 5,000 unit topical: TOPICAL | @ 14:00:00 | Stop: 2021-12-30

## 2021-12-30 MED ADMIN — bupivacaine-EPINEPHrine (PF) (MARCAINE-PF w/EPI) 0.25 %-1:200,000 injection (PF): @ 14:00:00 | Stop: 2021-12-30

## 2021-12-30 MED ADMIN — sodium chloride irrigation (NS) 0.9 % irrigation solution: @ 14:00:00 | Stop: 2021-12-30

## 2021-12-30 MED ADMIN — Propofol (DIPRIVAN) injection: INTRAVENOUS | @ 13:00:00 | Stop: 2021-12-30

## 2021-12-30 MED ADMIN — fentaNYL (PF) (SUBLIMAZE) injection: INTRAVENOUS | @ 13:00:00 | Stop: 2021-12-30

## 2021-12-30 MED ADMIN — ceFAZolin (ANCEF) IVPB 2 g in 50 ml dextrose (premix): 2 g | INTRAVENOUS | @ 22:00:00 | Stop: 2021-12-31

## 2021-12-30 MED ADMIN — oxyCODONE (ROXICODONE) immediate release tablet 10 mg: 10 mg | ORAL | @ 19:00:00 | Stop: 2022-01-13

## 2021-12-30 NOTE — Unmapped (Signed)
Neurosurgery Operative Note  MVD    Date of Surgery: 12/30/2021    Date of Dictation: 12/30/2021    Preoperative Diagnosis:  Normal Pressure Hydrocephalus    Postoperative Diagnosis: Same    Procedure Performed:   1.  Ceation of ventriculoperitoneal shunt  3. Laproscopic placement of peritoneal catheter   Two surgical teams    Teaching Surgeon:      Rachelle Hora, MD  Co-Surgeon:  Leonette Most         Resident Surgeon: none    Assistants: Leslye Peer    Anesthesia: General    Specimens: none    Implants: Shunts:  Sophysea, SETTING = 150, antibiotic coated , 120 cm peritoneal catheter     Estimated Blood Loss: 40 ml      Drains: none    Complications: None apparent    Indications for Surgery: The patient presented with a clinical story consistent with Normal Pressure Hydrocephalus.  A full explanation of the risks and alternative therapies was given to the family and patient.  The patient elected to proceed with surgery.    Operative Findings:  Clear CSF.  Brisk flow.  We were unable to balance light to use endoscopy.  We had excellent flow at 4.5 cm and left catheter in at 5.5 cm from dura.    Peritoneal contents visualized  Flow of CSF with pumping demonstrated with catheter in abdomen, catheter placed in pelvic regions.  Value pumps and refills  briskly    Procedure Details: The patient was brought to the operating room and general endotracheal anesthesia was induced.  The patient was placed in the supine position with head gently rotated towards the left.  Standard prep and drape of the head and neck and abdomen was performed.  Appropriate joint commission safety timeouts were performed.  Incisions were made over the area of the coronal suture in the right frontal area.  This is carried down to the galea.  The skin flap was reflected.  A pouch was made for the shunt valve.  Perforator was used to create a bur hole. A stab incision was made behind the rightear and we tunneled back to the burr hole incision.  We tunneled down to the abdomen at a port site.      Working with our general surgery colleagues, the peritoneum was entered with a scope and expanded with gas.  Normal anatomy was identified.  A total of 3 ports were placed. T the catheter was advanced into the abdomen through the right upper quadrant port.  When only a small amount of the catheter was visible through the port, the port was removed.  The distal end of the catheter was then tied to the tunneler brought back up to the cranial stab incision.  It was tied to the suture and pulled into the bur hole incision.  The proximal portion of the catheter which had been outside the skin in the abdomen was removed.  The catheter was filled with saline and irrigated into the abdomen.  We confirmed flow visually in the abdomen.  The valve was attached.    The dura was opened and coagulated.  The tip of the antibiotic catheter was cut off so we could use endoscopy if needed.   The catheter was passed into the ventricle and brisk return of clear CSF was noted.      The catheter was connected to the right angle connector and then onto the valve.  The shunt was situated properly.  We pumped and confirmed flow in the shunt by visualizing the flow in the abdomen.  Then placed it in the pelvic region of the peritoneum.     Copious irrigation was performed.  The galea was closed with 3-0 Vicryl and the skin in resorbable suture.  In the abdomen was closed with resorbable sutures.  The stop incision behind the ear was closed with vicryl suture and resorbable suture.    Depth of ventricular catheter 5.5 cm from dura      Teaching Surgeon Attestation:  I was present and directly participated in the entire procedure.

## 2021-12-30 NOTE — Unmapped (Signed)
NEUROSURGERY PROGRESS NOTE      Brief History of Present Illness  Christy Buchanan is a 66 y.o. female with NPH who presents for the placement of a R VPS (sophysa at 150) following a positive NPH trial.    Subjective/Interval History  Postoperative exam    Interval Imaging Reviewed  None    Neurological Assessment and Plan  **NPH (normal pressure hydrocephalus) (CMS-HCC)  - POD 0 R VPS (sophysa at 150)  - No activity, diet, or positioning restrictions  - ADAT  - No postop imaging needed  - Incisions closed with rapide and glue    Daily Maintenance:  VTE chemoprophylaxis: POD2  IV Fluids: None  Bowel Regimen: Senna and Miralax  Foley status: None  Nutrition: ADAT    Disposition: Floor    For questions regarding patients, please page [facility]N Inpatient Pager: [phone].  ___________________________________________________________________      Neurological Exam  Awake   Oriented x 3  PERRL  EOMI  F=  BUE 5  BLE 5  CDI    The patient's vitals, intake/output, labs, orders, and relevant imaging were reviewed for the last 24 hours.    Problem List  Principal Problem:    NPH (normal pressure hydrocephalus) (CMS-HCC)  Resolved Problems:    * No resolved hospital problems. *

## 2021-12-30 NOTE — Unmapped (Signed)
General Surgery History and Physical  Date: 12/30/2021  Attending physician: Celso Amy, MD    Chief Complaint:  Hydrocephalus    Assessment   Christy Buchanan is a 66 y.o. female with history of breast cancer and normal pressure hydrocephalus who presents for VP shunt creation. We are consulted for assistance with abdominal portion of procedure to occur today 12/30/21.      Plan:   - Proceed with VP shunt creation  - consent obtained in Dr. Valetta Fuller clinic. Abdominal portion of procedure discussed with patient and daughter.     Discussed with Dr. Leonette Most who agrees with the above plan.    History of Present Illness   Christy Buchanan is a 66 y.o. female with history of breast cancer, myeloproliferative disorder, and normal pressure hydrocephalus. We are consulted for assistance with abdominal portion of VP shunt creation. Past surgical history is notable for a TRAM flap. She had held ASA therapy for the past 10 days with clearance from heme/onc.     Allergies  Patient has no known allergies.    Medications     No current facility-administered medications for this encounter.     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    Social History:  Social History     Socioeconomic History    Marital status: Divorced   Tobacco Use    Smoking status: Former     Types: Cigarettes    Smokeless tobacco: Never   Vaping Use    Vaping Use: Never used   Substance and Sexual Activity    Alcohol use: Yes     Comment: rare     Social Determinants of Health     Financial Resource Strain: Medium Risk (08/20/2021)    Overall Financial Resource Strain (CARDIA)     Difficulty of Paying Living Expenses: Somewhat hard   Food Insecurity: No Food Insecurity (08/20/2021)    Hunger Vital Sign     Worried About Running Out of Food in the Last Year: Never true     Ran Out of Food in the Last Year: Never true   Transportation Needs: No Transportation Needs (08/20/2021)    PRAPARE - Therapist, art (Medical): No     Lack of Transportation (Non-Medical): No  Review of Systems  A 10-system review of systems was conducted and was negative except as documented above in the HPI.      Objective     Vital Signs    Patient Vitals for the past 8 hrs:   BP Pulse SpO2   12/30/21 0659 125/67 78 98 %     No intake/output data recorded.      Physical Exam  -General:  Appropriate, comfortable and in no apparent distress.   -Neurological: Alert and appropriately engaged. Moves all 4 extremities spontaneously.   -Cardiovascular: Regular rate, normotensive  -Pulmonary: Normal work of breathing on RA. Symmetric chest rise. No accessory muscle use.   -Abdomen: Soft, non-distended, non-tender, No rebound or guarding. Well healed transverse incision across middle of abdomen  -Genitourinary: Voiding spontaneously.   -Extremities: Warm, well perfused, normal skin turgor, no peripheral edema        Test Results    All lab results last 24 hours:  No results found for this or any previous visit (from the past 24 hour(s)).    Radiology Findings:  No results found.       Attending Attestation  I saw and evaluated the patient, participating in the key portions of the service. I reviewed the resident???s note. I agree with the resident???s findings and plan.  Celso Amy MD

## 2021-12-30 NOTE — Unmapped (Signed)
Preoperative Diagnosis:  Increased intracranial pressure    Postoperative Diagnosis:    Same.    Procedure(s) Performed:    1. Diagnostic laparoscopy   2. Laparoscopic placement of Peritoneal portion of ventriculoperitoneal shunt.    Teaching Surgeon:  Celso Amy, MD    Resident Surgeon:Anoosh BahrainiMD    Anesthesia:  General anesthesia.    Specimens:  Nil.     Estimated Blood Loss:  Minimal.     Indications for Surgery:  This is an unfortunate 66 y.o.  female   with signs and symptoms of intracranial  hypertension.  She  was taken to the operating room by Dr Barron Alvine to perform a  VP shunt. We were called to see him  to perform the peritoneal portion of the VP  shunt    Procedure:  After obtaining consent from the patient, the patient was taken to the  operating room and laid supine on the operating table.  Subsequently underwent  general endotracheal anesthesia.  There were two teams, the neurosurgical team  stayed at the head of the patient and while we stayed in the patient's abdomen.   After the timeouts, the umbilical stalk was picked up with 2 pairs of penetrating towel clamps. A small 5 mm incision was made in the umbilical stalk and a Veress needle was successfully placed into the peritoneal cavity. The peritoneal cavity was insufflated with common dioxide gas. A 5 mm bladed trocar was then introduced through the umbilical stalk. This patient has some adhesions in the peritoneal cavity. A second trocar was then placed in the left mid abdomen in the usual standard fashion. Adhesions were then taken down. Surgical was then placed in the right upper quadrant at the sites in which the VP shunt is to be delivered. The neurosurgical team tunneled the catheter from the right side of the head to the right side of the abdomen exiting at the trocar sites. An 0 Vicryl suture was then passed from the right upper quadrant trocar sites to be exited at the left abdominal trocar sites. The right ventricle site was then removed and the tie was tied to the end of the VP shunt. The VP shunt was then successfully passed into the peritoneal cavity. Using the left abdominal port sites the end of the VP shunt was inspected it is noted to be dripping CSF is then placed successfully into the pelvis. Abdomen was then desufflated the trocar sites were then removed under direct visualization and the sites were then closed using 4-0 Monocryl in a subcuticular manner. Patient was then reversed from  general anesthesia after the ventricular portion of the procedure was complete and skin was closed.      Attending Attestation  I was scrubbed and present for the entire procedure  Celso Amy MD,MPH.

## 2021-12-31 LAB — BASIC METABOLIC PANEL
ANION GAP: 7 mmol/L (ref 5–14)
BLOOD UREA NITROGEN: 16 mg/dL (ref 9–23)
BUN / CREAT RATIO: 21
CALCIUM: 8.7 mg/dL (ref 8.7–10.4)
CHLORIDE: 107 mmol/L (ref 98–107)
CO2: 26 mmol/L (ref 20.0–31.0)
CREATININE: 0.77 mg/dL
EGFR CKD-EPI (2021) FEMALE: 85 mL/min/{1.73_m2} (ref >=60)
GLUCOSE RANDOM: 120 mg/dL (ref 70–179)
POTASSIUM: 4.1 mmol/L (ref 3.4–4.8)
SODIUM: 140 mmol/L (ref 135–145)

## 2021-12-31 LAB — CBC
HEMATOCRIT: 27.4 % — ABNORMAL LOW (ref 34.0–44.0)
HEMOGLOBIN: 9.4 g/dL — ABNORMAL LOW (ref 11.3–14.9)
MEAN CORPUSCULAR HEMOGLOBIN CONC: 34.2 g/dL (ref 32.0–36.0)
MEAN CORPUSCULAR HEMOGLOBIN: 31.5 pg (ref 25.9–32.4)
MEAN CORPUSCULAR VOLUME: 92 fL (ref 77.6–95.7)
MEAN PLATELET VOLUME: 7.8 fL (ref 6.8–10.7)
PLATELET COUNT: 481 10*9/L — ABNORMAL HIGH (ref 150–450)
RED BLOOD CELL COUNT: 2.98 10*12/L — ABNORMAL LOW (ref 3.95–5.13)
RED CELL DISTRIBUTION WIDTH: 16.1 % — ABNORMAL HIGH (ref 12.2–15.2)
WBC ADJUSTED: 12.6 10*9/L — ABNORMAL HIGH (ref 3.6–11.2)

## 2021-12-31 MED ORDER — OXYCODONE 5 MG TABLET
ORAL_TABLET | ORAL | 0 refills | 5.00000 days | Status: CP | PRN
Start: 2021-12-31 — End: 2022-01-05
  Filled 2021-12-31: qty 30, 5d supply, fill #0

## 2021-12-31 MED ADMIN — acetaminophen (TYLENOL) tablet 650 mg: 650 mg | ORAL

## 2021-12-31 MED ADMIN — docusate sodium (COLACE) capsule 100 mg: 100 mg | ORAL | @ 02:00:00

## 2021-12-31 MED ADMIN — ondansetron (ZOFRAN) injection 4 mg: 4 mg | INTRAVENOUS

## 2021-12-31 MED ADMIN — meclizine (ANTIVERT) tablet 12.5 mg: 12.5 mg | ORAL

## 2021-12-31 MED ADMIN — ceFAZolin (ANCEF) IVPB 2 g in 50 ml dextrose (premix): 2 g | INTRAVENOUS | @ 13:00:00 | Stop: 2021-12-31

## 2021-12-31 MED ADMIN — ceFAZolin (ANCEF) IVPB 2 g in 50 ml dextrose (premix): 2 g | INTRAVENOUS | @ 06:00:00 | Stop: 2021-12-31

## 2021-12-31 MED ADMIN — acetaminophen (TYLENOL) tablet 650 mg: 650 mg | ORAL | @ 09:00:00 | Stop: 2021-12-31

## 2021-12-31 MED ADMIN — docusate sodium (COLACE) capsule 100 mg: 100 mg | ORAL | @ 13:00:00 | Stop: 2021-12-31

## 2021-12-31 MED ADMIN — omega-3 acid ethyl esters (LOVAZA) capsule 1 g: 1 g | ORAL | @ 13:00:00 | Stop: 2021-12-31

## 2021-12-31 MED ADMIN — pantoprazole (Protonix) EC tablet 20 mg: 20 mg | ORAL | @ 13:00:00 | Stop: 2021-12-31

## 2021-12-31 NOTE — Unmapped (Signed)
Acute care Surgery Progress Note    Service Date: 12/31/2021  Admit Date: 12/30/2021, Hospital Day: 2  Hospital Service: Neurosurgery [facility])  Attending: Francisca December, MD    Assessment     Christy Buchanan is a 66 y.o. female with h/o of breast cancer and normal pressure hydrocephalus who presents for VP shunt creation. We were consulted for assistance with abdominal portion of VP shunt placement 12/30/21.      1 Day Post-Op s/p Laparoscopic placement of peritoneal portion of ventriculoperitoneal shunt.      Interval Events   No acute events overnight. Pain well controlled. Had a small BM last night. Tolerating a diet and ambulating.     Plan     - OK to DC from general surgery perspective  - Restrictions:   - no heavy lifting (>15lbs) fr 6 weeks  - No submersion of wounds in water until completely healed. Showers with soap is ok.   - Glue on port sites will flake off on own no need to scrub or peal the glue off.  - OK to take stool softeners for constipation.    - general surgery will sign off at this time    If questions or concerns please reach out to general surgery consult pager [phone]    Cleophus Molt MD PGY-2     Objective     Vitals:   Temp:  [36.3 ??C (97.3 ??F)-36.8 ??C (98.2 ??F)] 36.5 ??C (97.7 ??F)  Heart Rate:  [76-91] 84  SpO2 Pulse:  [76-83] 80  Resp:  [11-25] 18  BP: (107-155)/(49-75) 148/52  MAP (mmHg):  [75-94] 77  SpO2:  [92 %-100 %] 96 %  BMI (Calculated):  [23.3] 23.3    Physical Exam:  -General:  Appropriate, comfortable and in no apparent distress.   -Neurological: Moves all 4 extremities spontaneously.   -Cardiovascular: HDS  -Pulmonary: Normal work of breathing.   -Abdomen: Soft, non-tender, non-distended. No rebound or guarding. Port sites clean dry well approximated with derma bond.   -Extremities: Warm, well perfused.     Labs  Lab Results   Component Value Date    WBC 12.6 (H) 12/31/2021    HGB 9.4 (L) 12/31/2021    HCT 27.4 (L) 12/31/2021    PLT 481 (H) 12/31/2021       Lab Results   Component Value Date    NA 140 12/31/2021    K 4.1 12/31/2021    CL 107 12/31/2021    CO2 26.0 12/31/2021    BUN 16 12/31/2021    CREATININE 0.77 12/31/2021    GLU 120 12/31/2021    CALCIUM 8.7 12/31/2021       Lab Results   Component Value Date    BILITOT 0.6 12/15/2021    PROT 7.2 12/15/2021    ALBUMIN 3.8 12/15/2021    ALT 23 12/15/2021    AST 28 12/15/2021    ALKPHOS 81 12/15/2021       Lab Results   Component Value Date    PT 11.4 12/15/2021    INR 1.02 12/15/2021    APTT 30.8 12/15/2021         Recent Imaging:  No results found.      Attending Attestation  I saw and evaluated the patient, participating in the key portions of the service. I reviewed the resident???s note. I agree with the resident???s findings and plan.  Celso Amy MD

## 2021-12-31 NOTE — Unmapped (Deleted)
RT has to hold the patient's nose shut with their free hand due to pt's rhinophyma condition.  Needs to be held shut for both VC and NIF to achieve accurate values.       12/30/21 2300   Pulmonary Mechanics   $$ Spirometry Pre/Post Tx Charge Yes   $$ Vital Capacity / SBT Yes   NIF -40 cmH2O   FVC 1.5 liters  (liters)   Patient Effort Good   Position High Fowler's   Adverse Reactions None

## 2021-12-31 NOTE — Unmapped (Signed)
Please schedule this patient for an appointment as noted below.  Please contact the patient as needed/appropriate to alert them of this appointment.    Provider: Damita Lack, MD    Date or Time Frame: 2 weeks     Reason for Visit: post op VPS    Imaging Needed: none

## 2021-12-31 NOTE — Unmapped (Signed)
NEUROSURGERY DISCHARGE SUMMARY    Identifying Information:   Christy Buchanan  [birth year]  [id number]    Admit date: 12/30/2021    Discharge date: 12/31/2021     Discharge Service: Neurosurgery [facility])    Discharge Attending Physician: Francisca December, MD    Discharge to: Home    Discharge Diagnoses:  Principal Problem:    NPH (normal pressure hydrocephalus) (CMS-HCC)  Resolved Problems:    * No resolved hospital problems. *      Hospital Course:   Christy Buchanan is a 66 y.o. female with a history of NPH, breast cancer, Graves dz, Sjogren's syndrome, and myeloproliferative neoplasm. She had been followed outpatient and had an LP trial done suggesting that her NPH symptoms may improve with VP shunt.  She was seen in clinic and consented for surgical intervention after discussing the risks, benefits, and alternatives in full detail.      The patient was taken to the OR on 12/30/21 for Right VP shunt.  She tolerated the procedure well, was extubated in the OR, and was taken to the PACU for further neuromonitoring and was then eventually transferred to the floor. She did well postoperatively. Her diet was slowly advanced and at the time of discharge she was tolerating a regular diet. The patient was able to void spontaneously, have her pain controlled with P.O. pain medication, and returned to the preoperative ambulatory status.  She will be discharged home on POD 1  in stable condition.  She should restart aspirin on POD 7 (01/06/22).     Follow-up:  1. 2 week postop Neurosurgery clinic appointment for wound care with Dr. Barron Alvine    Procedures:   12/30/21 Dr. Barron Alvine / Dr. Leonette Most  Creation of RIGHT ventriculoperitoneal shunt   Operative Findings:  Clear CSF.  Brisk flow.  We were unable to balance light to use endoscopy.  We had excellent flow at 4.5 cm and left catheter in at 5.5 cm from dura.    Peritoneal contents visualized  Flow of CSF with pumping demonstrated with catheter in abdomen, catheter placed in pelvic regions.  Value pumps and refills  briskly  Sophysea, SETTING = 150, antibiotic coated , 120 cm peritoneal catheter        Discharge Day Services:   The patient was seen and evaluated by the Neurosurgery team on the day of discharge and deemed stable for discharge, including stable labs, vital signs, radiographic studies, and neurologic exam.  Discharge instructions were given and explained.  Questions were answered.    Physical Exam:  General: No acute distress  Cardiovascular: Hemodynamically stable  Pulmonary: Breathing is unlabored  Neurologic:   Awake   Oriented x 3  PERRL  EOMI  F=  BUE 5  BLE 5  CDI    _____________________________________________________________________________    Temp:  [36.3 ??C (97.3 ??F)-36.8 ??C (98.2 ??F)] 36.8 ??C (98.2 ??F)  Heart Rate:  [76-91] 78  SpO2 Pulse:  [76-80] 80  Resp:  [11-25] 20  BP: (107-155)/(49-75) 145/56  MAP (mmHg):  [75-94] 77  SpO2:  [92 %-98 %] 98 %  BMI (Calculated):  [23.3] 23.3      Condition at Discharge:   Stable  _____________________________________________________________________________  Discharge Medications:     Your Medication List        STOP taking these medications      aspirin 81 MG tablet  Commonly known as: ECOTRIN            START taking these medications  acetaminophen 325 MG tablet  Commonly known as: TYLENOL  Take 2 tablets (650 mg total) by mouth every four (4) hours as needed for fever (temp >38.5C).     oxyCODONE 5 MG immediate release tablet  Commonly known as: ROXICODONE  Take 1 tablet (5 mg total) by mouth every four (4) hours as needed for up to 5 days.            CONTINUE taking these medications      atorvastatin 40 MG tablet  Commonly known as: LIPITOR  Take 1 tablet (40 mg total) by mouth daily.     cetirizine 10 MG tablet  Commonly known as: ZYRTEC  Take 1 tablet (10 mg total) by mouth daily.     citalopram 10 MG tablet  Commonly known as: CeleXA  Take 3 tablets (30 mg total) by mouth Three (3) times a day before meals. JAKAFI 10 mg tablet  Generic drug: ruxolitinib  Take 1 tablet (10 mg total) by mouth Two (2) times a day.     meclizine 12.5 mg tablet  Commonly known as: ANTIVERT  Take 1 tablet (12.5 mg total) by mouth Three (3) times a day as needed for dizziness or nausea.     multivitamin per tablet  Commonly known as: TAB-A-VITE/THERAGRAN  Take 1 tablet by mouth daily.     omega-3 fatty acids-fish oil 300-1,000 mg Cap capsule  Take 1 capsule by mouth in the morning.     omeprazole 20 MG capsule  Commonly known as: PriLOSEC  TAKE 1 CAPSULE(20 MG) BY MOUTH DAILY     raloxifene 60 mg tablet  Commonly known as: EVISTA  Take 1 tablet (60 mg total) by mouth daily.     vitamins B1 B6 B12 Liqd  Take by mouth daily.            _____________________________________________________________________________  Pending Test Results (if blank, then none):      Most Recent Labs:  Microbiology Results (last day)       ** No results found for the last 24 hours. **            Lab Results   Component Value Date    WBC 12.6 (H) 12/31/2021    HGB 9.4 (L) 12/31/2021    HCT 27.4 (L) 12/31/2021    PLT 481 (H) 12/31/2021       Lab Results   Component Value Date    NA 140 12/31/2021    K 4.1 12/31/2021    CL 107 12/31/2021    CO2 26.0 12/31/2021    BUN 16 12/31/2021    CREATININE 0.77 12/31/2021    CALCIUM 8.7 12/31/2021       _____________________________________________________________________________  Discharge Instructions:   Activity Instructions       Discharge activity      Activity:   Your muscles may be stiff or sore. Walking and moving around will help with both pain and stiffness. You will likely experience more fatigue for several days after surgery due to the medication used to put you to sleep. Try to remain active and awake during the day so that you are tired at bedtime and can get a full night???s rest. Avoid strenuous exercise for the first 2 weeks. Walking is ok. Do not do any bending, twisting, lifting over 10 lbs until cleared by the Neurosurgeon. Avoid any activity that puts you at risk for blows to the head.            Diet  Instructions       Discharge Nutrition Therapy      Discharge Nutrition Therapy: Regular    Please continue a regular diet with no restrictions.                Other Instructions       Discharge call MD      Things to look out for:  -Pain- please call the Neurosurgery resident on-call for any worsening pain unrelieved with prescribed medications.  -Fever- If you have a fever over 101.5 F, call the Neurosurgery resident on-call. A low grade fever is normal for some people after surgery.  -Redness, drainage, odor, bleeding- If your incision is red, draining, has a bad odor, or has increasing bleeding, contact the Neurosurgery office during business hours or the Neurosurgery resident on-call after hours. There may be some normal swelling and pink-coloring around the wound. There may also be some fluid under the skin. It can take several weeks to months to be reabsorbed.  -Muscle strength- If an arm or leg becomes weak, call the Neurosurgery resident on     -Call immediately for the following:  -Bowel and Bladder- If you have new loss of bowel or bladder control, report straight to the emergency room.  -Mental status- If you or your family notice a change in your mental state from your normal, go straight to the emergency room. A change in mental status includes: increased confusion, trouble waking up, blurry/double vision, severe uncontrolled headaches, vomiting, loss of control of one side of your body, seizures, or other concerning issues.    Important Numbers:  -Neurosurgery resident on-call 24/7: Call [phone] for the [facility] hospital operator. Ask to speak with the Neurosurgery resident on-call.  -Neurosurgery Clinic: [phone]    Discharge dressing      Wound care:   Examine the surgical site at least twice daily. Please change the dressing when wet/dirty until the 3rd day after surgery, then keep the wound open to air. Do not use ointments, creams, gels, peroxide, or blow dryers on the wound. You may shower on the 4th day after surgery. Please shower daily starting on day 4 and use baby shampoo for head incisions to clean the wound. Do not scrub too vigorously. Do not submerge the wound under water (swimming or tub soaking) until 4-6 weeks after surgery. After showering, pat the wound dry and leave open to air. You have an appointment in approximately 2 weeks to have the wound checked. If you did not receive an appointment while in the hospital, the clinic will contact you will an appointment.    Discharge instructions      Restart aspirin on the 7th day after surgery (01/06/22)            Follow Up instructions and Outpatient Referrals     Discharge instructions          Appointments which have been scheduled for you      Mar 11, 2022 10:15 AM  (Arrive by 9:45 AM)  LAB ONLY [place] with ADULT ONC LAB  [facility] ADULT ONCOLOGY LAB DRAW STATION [place] [facility] REGION) [address]  [place] [address]  [phone]        Mar 11, 2022 11:20 AM  (Arrive by 10:50 AM)  RETURN ACTIVE [place] with Halford Decamp, MD  [facility] HEMATOLOGY ONCOLOGY 2ND FLR CANCER HOSP [facility] REGION) [address]  [place] [address]  [phone]  Mar 13, 2022  1:20 PM  (Arrive by 1:05 PM)  MAMMO DIGITAL SCREENING W TOMO RIGHT with HBR MAMMO RM 1  [facility] Breast Imaging Department [facility]) [address]  [place] [address]  [phone]   Please Wear a two piece outfit, Do not wear deodorant, powder, oils or lotion on your chest and under arms.         Mar 31, 2022  8:50 AM  (Arrive by 8:35 AM)  RETURN  ENDOCRINE with Lindalou Hose Dulcy Fanny, MD  [facility] DIABETES AND ENDOCRINOLOGY [place]  [facility] REGION) [address]  [facility] 1 through 4  [place] [address]  [phone]

## 2021-12-31 NOTE — Unmapped (Signed)
A&O x 4, BUE 5/5, BLE 4/5 strengths. Post op incisions to R head and lap sites to abdomen clean, dry and intact. Patient ambulating to bathroom independently. Vital signs within parameters. Voiding spontaneously. No BM this shift. Pain managed by PRN acetaminophen. Patient refuses to take PRN oxycodone, made her nauseated. For potential discharge today. Will monitor.  Problem: Adult Inpatient Plan of Care  Goal: Plan of Care Review  Outcome: Progressing  Goal: Patient-Specific Goal (Individualized)  Outcome: Progressing  Goal: Absence of Hospital-Acquired Illness or Injury  Outcome: Progressing  Intervention: Identify and Manage Fall Risk  Recent Flowsheet Documentation  Taken 12/31/2021 0200 by Constance Holster, RN  Safety Interventions:   low bed   nonskid shoes/slippers when out of bed  Taken 12/31/2021 0000 by Constance Holster, RN  Safety Interventions:   low bed   nonskid shoes/slippers when out of bed  Taken 12/30/2021 2200 by Constance Holster, RN  Safety Interventions:   low bed   nonskid shoes/slippers when out of bed  Taken 12/30/2021 2000 by Constance Holster, RN  Safety Interventions:   low bed   nonskid shoes/slippers when out of bed  Intervention: Prevent Skin Injury  Recent Flowsheet Documentation  Taken 12/31/2021 0200 by Constance Holster, RN  Positioning for Skin: Left  Taken 12/31/2021 0000 by Constance Holster, RN  Positioning for Skin: Supine/Back  Taken 12/30/2021 2200 by Constance Holster, RN  Positioning for Skin: Supine/Back  Taken 12/30/2021 2000 by Constance Holster, RN  Positioning for Skin: Left  Intervention: Prevent and Manage VTE (Venous Thromboembolism) Risk  Recent Flowsheet Documentation  Taken 12/31/2021 0200 by Constance Holster, RN  Anti-Embolism Intervention: (no order) Other (Comment)  Taken 12/31/2021 0000 by Constance Holster, RN  Anti-Embolism Intervention: (no order) Other (Comment)  Taken 12/30/2021 2200 by Constance Holster, RN  Anti-Embolism Intervention: (no order) Other (Comment)  Taken 12/30/2021 2000 by Constance Holster, RN  Anti-Embolism Intervention: (no order) Other (Comment)  Goal: Optimal Comfort and Wellbeing  Outcome: Progressing  Goal: Readiness for Transition of Care  Outcome: Progressing  Goal: Rounds/Family Conference  Outcome: Progressing     Problem: Fall Injury Risk  Goal: Absence of Fall and Fall-Related Injury  Outcome: Progressing  Intervention: Promote Injury-Free Environment  Recent Flowsheet Documentation  Taken 12/31/2021 0200 by Constance Holster, RN  Safety Interventions:   low bed   nonskid shoes/slippers when out of bed  Taken 12/31/2021 0000 by Constance Holster, RN  Safety Interventions:   low bed   nonskid shoes/slippers when out of bed  Taken 12/30/2021 2200 by Constance Holster, RN  Safety Interventions:   low bed   nonskid shoes/slippers when out of bed  Taken 12/30/2021 2000 by Constance Holster, RN  Safety Interventions:   low bed   nonskid shoes/slippers when out of bed     Problem: Wound  Goal: Optimal Coping  Outcome: Progressing  Goal: Optimal Functional Ability  Outcome: Progressing  Intervention: Optimize Functional Ability  Recent Flowsheet Documentation  Taken 12/31/2021 0000 by Constance Holster, RN  Activity Management: ambulated to bathroom  Taken 12/30/2021 2000 by Constance Holster, RN  Activity Management: ambulated to bathroom  Goal: Absence of Infection Signs and Symptoms  Outcome: Progressing  Goal: Improved Oral Intake  Outcome: Progressing  Goal: Optimal Pain Control and Function  Outcome: Progressing  Goal: Skin Health and Integrity  Outcome: Progressing  Intervention: Optimize Skin Protection  Recent Flowsheet Documentation  Taken 12/31/2021 0200 by Constance Holster, RN  Head of Bed [facility]) Positioning: HOB at 30 degrees  Taken 12/31/2021 0000 by Constance Holster, RN  Activity Management: ambulated to bathroom  Head of Bed [facility]) Positioning: HOB at 30 degrees  Taken 12/30/2021 2200 by Constance Holster, RN  Head of Bed [facility]) Positioning: HOB at 30 degrees  Taken 12/30/2021 2000 by Constance Holster, RN  Activity Management: ambulated to bathroom  Head of Bed (HOB) Positioning: HOB at 30 degrees  Goal: Optimal Wound Healing  Outcome: Progressing

## 2022-01-05 NOTE — Unmapped (Addendum)
LVM and recall placed for pt to call for scheduling return appt    ----- Message from Thomasena Edis, RN sent at 01/05/2022 10:34 AM EST -----  Good Morning!    Patient needs to be scheduled for post op appointment with Ewend on 12/4 or 12/11.    Thank you!  Dewayne Hatch

## 2022-01-12 ENCOUNTER — Ambulatory Visit: Admit: 2022-01-12 | Discharge: 2022-01-13 | Payer: MEDICARE

## 2022-01-12 DIAGNOSIS — G912 (Idiopathic) normal pressure hydrocephalus: Principal | ICD-10-CM

## 2022-01-12 DIAGNOSIS — Z982 Presence of cerebrospinal fluid drainage device: Principal | ICD-10-CM

## 2022-01-12 NOTE — Unmapped (Signed)
Neurosurgery Clinic Note - Return Patient    [place] Neurosurgery Provider:  Rachelle Hora, MD FACS  Patient Name: Christy Buchanan  ____________________________________________________  Assessment/Plan:   Christy Buchanan is a 66 y.o. female who presents to clinic for a post-op follow-up and wound check s/p right VP shunt on 12/30/21 (Sophysa at 150). She has been doing well since surgery, with significant improvement in her NPH symptoms. Her incisions appear to be healing well. We discussed that patient would benefit from seeing her physical therapist again for evaluation and to repeat the assessments that they performed after her lumbar puncture, in order to more objectively evaluate her improvement and establish a new baseline. We discussed the importance of daily hair washing and monitoring the incision. We also recommended that she continue to take laxatives to assist with regular bowel movements, as she continues to have some constipation and gas distention. We discussed with the patient that she should reach out to [place] after the new year with a subjective assessment of how she is doing and whether her symptoms have continued to improve. We can at that point schedule another appointment for follow-up and depending on how she feels, can discuss adjusting her shunt setting if needed.     _____________________________________________________    History of Present Illness:  Christy Buchanan is a 66 y.o. female with history of NPH, breast cancer, Grave's disease, Sjogren's syndrome, and myeloproliferative neoplasm s/p right VP shunt (Sophysa at 150) on 12/30/21. Patient reports that since her shunt placement, she has had improvement in her gait/balance, much more steady on her feet and no longer falling, though still wobbly at times. She has had significant improvement in her mental status and cognition, no longer feeling foggy and now feeling very awake. Her urinary frequency has also improved from needing to urinate every hour in the night, to only needing to urinate 1-2 x a night. She reports improvement in her headache as well, with pain at posterior aspect of her head no longer bothering her. She feels very gassy and endorses continued constipation, though she has just started taking laxatives with some improvement.     Past Medical History:  Past Medical History:   Diagnosis Date   ??? Autoimmune thyroiditis    ??? Breast cancer (CMS-HCC)    ??? Difficult intravenous access    ??? GERD (gastroesophageal reflux disease)    ??? Hyperlipidemia    ??? Osteoporosis    ??? 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 Sjogren's disease (CMS-HCC)    ??? Thrombocythemia    ??? Vitamin D deficiency      Past medical and surgical history plus medication and allergies reviewed in [email or web address]     Social History:  Accompanied by daughter    Physical Exam:  General: No acute distress. Pt sitting comfortably in exam room.  A & O x3. Naming intact, speech fluent.  Good historian for recent and distant events in medical history.  Follows commands easily  Peripheral fields intact to confrontation bilaterally.   EOMI  Facial muscles intact and symmetric.  Phonation normal   Motor strength 5/5 upper and lower extremeties, bilaterally. No drift  Gait smooth and coordinated.     Test Results  None    Detailed Problem List:  Patient Active Problem List   Diagnosis   ??? Sleep disorder   ??? Abnormal Pap smear of cervix   ??? Family history of colon cancer   ??? Family history  of thyroid cancer   ??? Gastroesophageal reflux disease   ??? History of breast cancer   ??? Mixed hyperlipidemia   ??? Postmenopausal osteoporosis   ??? Pure hypercholesterolemia   ??? Sjogrens syndrome (CMS-HCC)   ??? Vitamin D deficiency   ??? Depression, major, single episode, complete remission (CMS-HCC)   ??? Essential thrombocythemia (CMS-HCC)   ??? Acquired hypothyroidism   ??? JAK-2 gene mutation   ??? MPN (myeloproliferative neoplasm) (CMS-HCC)   ??? Loss of consciousness (CMS-HCC)   ??? NPH (normal pressure hydrocephalus) (CMS-HCC)   ??? Atypical meningioma of brain (CMS-HCC)   ??? Red blood cell antibody positive

## 2022-01-12 NOTE — Unmapped (Signed)
It was our pleasure to see you in clinic today. Please ask your physical therapist to evaluate you and repeat the assessments that they performed after your lumbar puncture. Please continue to wash your hair daily. Please reach out to [place] after the new year regarding how your symptoms are doing, and we will discuss the need for scheduling another appointment at that time.

## 2022-01-12 NOTE — Unmapped (Signed)
Clinic Visit with Resident  Attending:  Rachelle Hora, MD  ______________________________________________________________________  Attending Attestation by Damita Lack, MD (Attending Neurosurgeon):  I saw and evaluated the patient, participating in the key portions of the service.  I reviewed the resident???s note.  I agree with the resident???s findings and plan.       She is doing very well status postplacement of a right frontal ventriculoperitoneal shunt for normal pressure hydrocephalus.  Her walking clarity of thought and bladder control have all improved.  The wounds are well-healed although there is still Dermabond over portions of the incision.    We will continue to follow her neurologic recovery and consider turning the shunt from 150-->110 in January if she is improved but not quite to the level of function she desires.  We made her aware that the biggest change is from no shunt to working shunt and that the changes between different settings of the valve would be smaller.    In regards to her wounds, she will continue to shower every day and use a washcloth for very gentle washing and debridement of the incisions.    She does not need a scheduled follow-up at this time.  She will send me a MyChart message in January to give me an update on her recovery.  I will be particular interested in whether she feels she has improved between today's visit and the January report.

## 2022-01-16 NOTE — Unmapped (Signed)
[facility] Refill Coordination Note    Specialty Medication(s) to be Shipped:   Hematology/Oncology: Christy Buchanan    Other medication(s) to be shipped: No additional medications requested for fill at this time     Christy Buchanan, DOB: October 08, 1955  Phone: [phone] (home)       All above HIPAA information was verified with patient.     Was a Nurse, learning disability used for this call? No    Completed refill call assessment today to schedule patient's medication shipment from the [facility] [phone]).  All relevant notes have been reviewed.     Specialty medication(s) and dose(s) confirmed: Regimen is correct and unchanged.   Changes to medications: Keera reports no changes at this time.  Changes to insurance: No  New side effects reported not previously addressed with a pharmacist or physician: None reported  Questions for the pharmacist: No    Confirmed patient received a Conservation officer, historic buildings and a Surveyor, mining with first shipment. The patient will receive a drug information handout for each medication shipped and additional FDA Medication Guides as required.       DISEASE/MEDICATION-SPECIFIC INFORMATION        N/A    SPECIALTY MEDICATION ADHERENCE     Medication Adherence    Patient reported X missed doses in the last month: 0  Specialty Medication: Jakafi 10 mg  Patient is on additional specialty medications: No  Patient is on more than two specialty medications: No  Any gaps in refill history greater than 2 weeks in the last 3 months: no  Demonstrates understanding of importance of adherence: yes  Informant: patient  Reliability of informant: reliable  Provider-estimated medication adherence level: good  Patient is at risk for Non-Adherence: No  Reasons for non-adherence: no problems identified                                Were doses missed due to medication being on hold? No    JAKAFI 10 mg tablet (ruxolitinib)  : 8 days of medicine on hand       REFERRAL TO PHARMACIST Referral to the pharmacist: Not needed      [facility]     Shipping address confirmed in Epic.     Delivery Scheduled: Yes, Expected medication delivery date: 01/22/22.     Medication will be delivered via Same Day Courier to the prescription address in Epic WAM.    Christy Buchanan' Christy Buchanan Shared [facility] Specialty Technician

## 2022-01-22 MED FILL — JAKAFI 10 MG TABLET: ORAL | 30 days supply | Qty: 60 | Fill #4

## 2022-02-10 ENCOUNTER — Ambulatory Visit
Admit: 2022-02-10 | Discharge: 2022-02-10 | Payer: MEDICARE | Attending: Rehabilitative and Restorative Service Providers" | Primary: Rehabilitative and Restorative Service Providers"

## 2022-02-11 NOTE — Unmapped (Signed)
[facility] HEALTH SCIENCES PT [facility]  OUTPATIENT PHYSICAL THERAPY  Treatment Note  02/10/2022    Patient Name: Christy Buchanan  Date of Birth:1955/04/10  Session Number: 7  Diagnosis:   Encounter Diagnoses   Name Primary?    Impaired functional mobility, balance, gait, and endurance Yes    Recurrent falls     Decreased coordination     NPH (normal pressure hydrocephalus) (CMS-HCC)        Onset of Symptoms: 10/03/20  Date of Evaluation: 10/03/21  Referring Provider: Madelon Lips, DO  Dates of Certification: 10/07/21 - 01/05/22; requesting recertification 02/10/22 - 04/07/22       Problem List: Impaired balance, Impaired sensation, Fall Risk, Decreased mobility, Decreased coordination    ASSESSMENT:    Christy Buchanan is a 67 year old female with a 1-year history of recurrent falls. The pt has mild LE weakness (affecting L hip abductors and B ankle plantarflexors), mild coordination deficits bilaterally (L worse than R), absent proprioception at the L great toe, and balance and gait difficulties. She had positive effects from lumbar puncture trial (for possible NPH) and will be scheduling surgery for a V-P shunt. The pt ambulates independently with or without a SPC.      Today's session focused on dynamic balance and gait activities. The pt completed the this date using her [facility] with a distance of 1425' (434.3 meters), which is a significant improvement from her previous distance of 1147' (349.6 meters) at clinic visit on 11/25/21. Her time on the FSST has improved from 15.4 sec on 11/11/21 to 11.5 sec today. The pt continues to show some deterioration in gait performance (slowing) when asked to walk while simultaneously performing a secondary cognitive task. The pt should continue to benefit from skilled PT services to increase muscle strength, address dizziness/ unsteadiness, improve balance, increase walking capacity and confidence, and decrease risk of falls.           Long Term Goals: (12 weeks)  Long Term Goal 1: The pt will demonstrate independent performance of HEP to maintain functional gains. GOAL MET (11/25/21)         Long Term Goal 2: The pt's TUG time will improve to 12 seconds or less to demonstrate decreased risk for falling. GOAL MET (02/10/22)         Long Term Goal 3: The pt will improve in 5x sit-to-stand time to less than 11 seconds to demonstrate improvement in overall function. GOAL MET (11/25/21)         Long Term Goal 4: NEW GOAL (added 10/14/21) Pt's score on the FGA will increase to at least 22/30 to demonstrate meaningful improvement in dynamic balance and decreased risk of falls. GOAL MET (02/10/22)         Long Term Goal 5: NEW GOAL (added 10/14/21) Pt's score on the ABC Scale will increase to at least 67% to demonstrate improved balance confidence and decreased risk of falls. PROGRESSING (on 02/10/22, score = 56.3%)         Long Term Goal 6: NEW GOAL (added 11/18/21) Pt's distance will increase to at least 363 meters to demonstrate meaningful improvement in walking capacity. GOAL MET (12/16/21)         Long Term Goal 7: NEW GOAL (added 02/10/22) Pt will be able to maintain balance in standing on A-P tilt board with EC for >10 sec to demonstrate decreased reliance on vision for balance.           PLAN:  Follow-up / Frequency - Other: 3x/month for Duration: additional 2 months    Next Visit Plan: assess further for possible vestibular hypofunction (repeat HIT); sensory reweighting (decrease reliance on vision); progress balance and gait activities, including turning, walking with head turns, walking with EC    SUBJECTIVE:  The pt says she had a dramatic improvement in her thinking and memory and felt much more like herself following lumbar puncture, but the improvements started to fade after a couple of days. The pt states that she is anxious to schedule the shunt surgery. This is the only chance I have.    The pt reports no falls since last PT visit.     Most recent reported fall: end of July, 2023       Precautions: Cancer history, Osteoporosis, Fall risk       OBJECTIVE:       Pain: No/denies                                     TODAY'S TREATMENT:  Pt arrived to session using SPC in her R hand and wearing athletic-style footwear.    Therapist wore KN95/N95 mask throughout the session in the context of the COVID-19 pandemic.     All activities performed without SPC unless otherwise noted.    Neuromuscular Re-education: 40 min (3 units)  -Stepping forward, backward, left, right, and diagonally over low obstacles (set up as for Four Square Step Test), multiple reps with CGA; pt's performance improved quickly with practice/repetition, although pt still had mild difficulty with diagonal steps backward  -Walking with horizontal and vertical head turns, 30' x 2 for each direction of head movement with CGA; pt with slight instability during this activity  -Walking with EC, 30' x 2 with CGA; very challenging for pt, so regressed to walking with alternating EO and EC as cued by therapist  -Backward walking 30' x 2 with CGA; pt performed well  -Tandem walking, 15' x 2 with CGA; pt performed well with arms at sides, but had much more difficulty with arms folded across chest  -Forward progression (over approximately 20') x 2 with 180-degree turn as cued by therapist; pt performed well   -Forward progression (over approximately 20') x 2 with 360-degree turn as cued by therapist; pt reported feeling slightly dizzy after 2nd trial, but no gait instability noted     Therapeutic Activities: 15 min (1 unit)  -Walking outdoors on level and sloping paved surfaces under dual-task conditions (secondary tasks of naming things in different categories); pt showed minor slowing of gait when performing the 2 tasks simultaneously  -Walking outdoors on paved level surfaces for 6 minutes continuously with SBA using SPC in R hand (during ); pt walked 1425' (434.3 meters)      FROM INITIAL EVALUATION:  Special Cerebellar Tests: (assessed on 10/14/21)  F-N-F: pt performed slowly, with mild dysmetria bilaterally; slight intention tremor noted RUE; pt completed 5 reps in 12.9 sec on the L and 11.2 sec on the R  RAM (forearm pronation/supination): pt has mild dysdiadochokinesia; completed 5 reps in 5.1 sec on the L and 4.9 sec on the R  H-S (performed in sitting, with heel to knee): mild dysmetria bilaterally; pt completed 5 reps in 7.6 sec on the L and 6.4 sec on the R    Visual/Vestibular Testing: (initially assessed on 10/14/21)  Smooth pursuit: EOMI; WNL  Saccades: WNL  VOR:  abnormal - pt states this makes me feel bad; on 11/11/21, pt was able to perform for 60 sec, keeping the target in focus per her report  VOR cancellation: NT  Head Shaking Nystagmus: NT  Head Thrust: (assessed on 11/11/21) = negative bilaterally  DVA: NT    Tests for BPPV  Dix-Hallpike: (assessed on 11/11/21) = negative bilaterally  Roll Test: (assessed on 11/11/21) = negative bilaterally (with no nystagmus noted), although pt reported slight queasiness    Cognitive Status/Communication: Pt is alert, oriented, and cooperative. She communicates clearly and is able to follow instructions without difficulty. On 11/11/21, pt had difficulty remembering and following instructions for more complex tasks. She also did not recall activities she had performed at previous visits.    Posture/Observations:   Unremarkable    Sensation:   UE Sensation/Dermatomes:   Motion Right Left   C4 intact intact   C5 intact intact   C6 intact intact   C7 intact intact   C8 intact intact   T1 intact intact     LE Proprioception (assessed on 10/14/21): Absent at the great toe but intact at the ankle on the L, intact at the great toe and proximally on the R    Reflexes:     Right  Left Comments   Brachioradialis (C5-6) WNL WNL     Biceps (C5-6) WNL WNL               Patellar tendon (L3,L4) WNL WNL     Achilles tendon (S1,S2) WNL WNL               Hoffman's  negative negative       Clonus: Negative; bilateral    Range of Motion/Flexibility:      Spine Range of Motion/Flexibility:    0-24%   range available 25-49%   range available 50-74% range available  75+%   range available Comments   Flexion x           Extension x           R Rotation x           L Rotation x           R Side bend x           L Side bend x              LE ROM is WFL bilaterally.      Strength/MMT:     LE MMT     LE MMT  Left  Right    Hip flex: (L2)  5/5  5/5    Hip abd:  4/5* 5/5*    Hip ext: /5 /5   Knee ext: (L3)  5/5  5/5    Knee flex: (S2)  5/5  5/5    Ankle DF: (L4)  5/5  5/5    Ankle PF: (S1)  4/5*  4/5*    *assessed on 10/14/21    Functional Mobility:  Pt is independent in all bed mobility and transfers. Pt is able to come to standing from standard chair independently without use of UEs for support.      Balance:   Pt able to maintain balance in sitting and in typical stance position independently without UE support. Able to maintain feet together and semi-tandem stance positions for >20 sec without UE support.    Gait Analysis:  decreased gait speed  decreased B step length  wide BOS    Pt  ambulates independently with or without SPC in R hand.      Functional Test/Outcome Measures:  5XSTS: 17 sec; on 11/25/21 = 10.1 sec (mean of 2 trials)    TUG (no assistive device): 23 sec; on 11/18/21 = 13.6 sec (mean of 3 trials); on 11/25/21 = 11.9 sec (mean of 3 trials)    Functional Gait Assessment (FGA): on 10/14/21 = 17/30 (-2 points each for gait speed, gait with horizontal head turns, gait with vertical head turns, gait with EC, and backward walking; -1 point each for gait with pivot turn, obstacle negotiation, and stair negotiation)    ABC Scale: on 10/14/21 = 39.4%    Dizziness Handicap Inventory (DHI): /100    FSST: on 11/11/21 = 15.4 sec; on 12/16/21 = 11.5 sec    Tandem Stance: 10 sec with L foot forward, 15 sec with R foot forward; on 11/18/21 = >30 sec with each foot in front    SLS: on 11/11/21 = >30 sec on the L and 28.6 sec on the R    Modified CTSIB: on 11/11/21  Firm surface, feet together with EO: >30 sec with min sway  Firm surface, feet together with EC: >30 sec with mod sway  Foam surface, feet together with EO: >30 sec with mod sway  Foam surface, feet together with EC: >30 sec with severe sway    (comfortable gait speed with no assistive device): on 11/11/21, mean of 2 trials = 0.72 m/sec; # of steps = 20    (fast gait speed with no assistive device): on 11/11/21, mean of 2 trials = 0.98 m/sec; # of steps = 18 on T1, 17 on T2    (using SPC): on 11/18/21 = 1027' (313.0 meters); on 11/25/21 = 1147' (349.6 meters); on 12/16/21 = 1425' (434.3 meters)    Education Provided: symptom management, safety recommendations;   Education results: Verbalized understanding, demonstrated understanding       Current HEP: STS 3x8; tandem stance 3x3 with each foot in front; B heel raises in standing 2 x 20 (added 11/18/21); B hip ABD in standing 2 x 10 (added 11/18/21)    [email or web address] code: [id number]    Total Time: 40      I attest that I have reviewed the above information.  Signed: Sedalia Muta, PT  02/10/2022 6:35 PM symptom management, safety recommendations;   Education results: Verbalized understanding, demonstrated understanding       Current HEP: STS 3x8; tandem stance 3x3 with each foot in front; B heel raises in standing 2 x 20 (added 11/18/21); B hip ABD in standing 2 x 10 (added 11/18/21)    [email or web address] code: [id number]    Total Time: 37      I attest that I have reviewed the above information.  Signed: Sedalia Muta, PT  02/10/2022 6:35 PM

## 2022-02-13 NOTE — Unmapped (Signed)
[facility] Refill Coordination Note    Specialty Medication(s) to be Shipped:   Hematology/Oncology: Christy Buchanan    Other medication(s) to be shipped: No additional medications requested for fill at this time     Christy Buchanan, DOB: 07-06-1955  Phone: [phone] (home)       All above HIPAA information was verified with patient.     Was a Nurse, learning disability used for this call? No    Completed refill call assessment today to schedule patient's medication shipment from the [facility] [phone]).  All relevant notes have been reviewed.     Specialty medication(s) and dose(s) confirmed: Regimen is correct and unchanged.   Changes to medications: Christy Buchanan reports no changes at this time.  Changes to insurance: No  New side effects reported not previously addressed with a pharmacist or physician: None reported  Questions for the pharmacist: No    Confirmed patient received a Conservation officer, historic buildings and a Surveyor, mining with first shipment. The patient will receive a drug information handout for each medication shipped and additional FDA Medication Guides as required.       DISEASE/MEDICATION-SPECIFIC INFORMATION        N/A    SPECIALTY MEDICATION ADHERENCE     Medication Adherence    Patient reported X missed doses in the last month: 0  Specialty Medication: Jakafi 10 mg  Patient is on additional specialty medications: No  Patient is on more than two specialty medications: No  Any gaps in refill history greater than 2 weeks in the last 3 months: yes  Demonstrates understanding of importance of adherence: yes  Informant: patient                            Were doses missed due to medication being on hold? No    Jakafi 10 mg: 22 days of medicine on hand     REFERRAL TO PHARMACIST     Referral to the pharmacist: Not needed      [facility]     Shipping address confirmed in Epic.     Delivery Scheduled: Yes, Expected medication delivery date: 02/19/22.     Medication will be delivered via Next Day Courier to the prescription address in Epic WAM.    Oliva Bustard, PharmD   [facility] Specialty Pharmacist

## 2022-02-14 DIAGNOSIS — D473 Essential (hemorrhagic) thrombocythemia: Principal | ICD-10-CM

## 2022-02-16 DIAGNOSIS — R42 Dizziness and giddiness: Principal | ICD-10-CM

## 2022-02-16 DIAGNOSIS — Z853 Personal history of malignant neoplasm of breast: Principal | ICD-10-CM

## 2022-02-16 MED ORDER — RALOXIFENE 60 MG TABLET
ORAL_TABLET | Freq: Every day | ORAL | 3 refills | 90 days | Status: CP
Start: 2022-02-16 — End: ?

## 2022-02-19 NOTE — Unmapped (Signed)
Christy Buchanan 's Jakafi shipment will be delayed as a result of a high copay.     I have reached out to the [phone]  at [phone]  and left a voicemail message.  We will call the patient back to reschedule the delivery upon resolution. We have not confirmed the new delivery date.

## 2022-02-24 NOTE — Unmapped (Signed)
Christy Buchanan 's Jakafi shipment will be delayed as a result of a high copay.  Assistance obtained and co-pay is now $0.    I have reached out to the patient  at [phone] and left a voicemail message.  We will wait for a call back from the patient to reschedule the delivery.  We have not confirmed the new delivery date.

## 2022-02-24 NOTE — Unmapped (Signed)
Christy Buchanan 's JAKAFI 10 mg tablet (ruxolitinib) shipment will be sent out  as a result of copay is now approved by patient/caregiver.      I have reached out to the patient   and communicated the delivery change. We will reschedule the medication for the delivery date that the patient agreed upon.  We have confirmed the delivery date as 02/25/22, via same day courier.

## 2022-02-25 MED FILL — JAKAFI 10 MG TABLET: ORAL | 30 days supply | Qty: 60 | Fill #5

## 2022-03-09 DIAGNOSIS — D420 Neoplasm of uncertain behavior of cerebral meninges: Principal | ICD-10-CM

## 2022-03-11 ENCOUNTER — Ambulatory Visit
Admit: 2022-03-11 | Discharge: 2022-03-11 | Payer: MEDICARE | Attending: Hematology & Oncology | Primary: Hematology & Oncology

## 2022-03-11 ENCOUNTER — Other Ambulatory Visit: Admit: 2022-03-11 | Discharge: 2022-03-11 | Payer: MEDICARE

## 2022-03-11 DIAGNOSIS — E785 Hyperlipidemia, unspecified: Principal | ICD-10-CM

## 2022-03-11 DIAGNOSIS — D473 Essential (hemorrhagic) thrombocythemia: Principal | ICD-10-CM

## 2022-03-11 DIAGNOSIS — E538 Deficiency of other specified B group vitamins: Principal | ICD-10-CM

## 2022-03-11 LAB — CBC W/ AUTO DIFF
BASOPHILS ABSOLUTE COUNT: 0.1 10*9/L (ref 0.0–0.1)
BASOPHILS RELATIVE PERCENT: 0.8 %
EOSINOPHILS ABSOLUTE COUNT: 0.1 10*9/L (ref 0.0–0.5)
EOSINOPHILS RELATIVE PERCENT: 1.7 %
HEMATOCRIT: 33.5 % — ABNORMAL LOW (ref 34.0–44.0)
HEMOGLOBIN: 11.9 g/dL (ref 11.3–14.9)
LYMPHOCYTES ABSOLUTE COUNT: 1.5 10*9/L (ref 1.1–3.6)
LYMPHOCYTES RELATIVE PERCENT: 16.8 %
MEAN CORPUSCULAR HEMOGLOBIN CONC: 35.5 g/dL (ref 32.0–36.0)
MEAN CORPUSCULAR HEMOGLOBIN: 31.7 pg (ref 25.9–32.4)
MEAN CORPUSCULAR VOLUME: 89.5 fL (ref 77.6–95.7)
MEAN PLATELET VOLUME: 7.7 fL (ref 6.8–10.7)
MONOCYTES ABSOLUTE COUNT: 0.7 10*9/L (ref 0.3–0.8)
MONOCYTES RELATIVE PERCENT: 7.6 %
NEUTROPHILS ABSOLUTE COUNT: 6.4 10*9/L (ref 1.8–7.8)
NEUTROPHILS RELATIVE PERCENT: 73.1 %
PLATELET COUNT: 659 10*9/L — ABNORMAL HIGH (ref 150–450)
RED BLOOD CELL COUNT: 3.74 10*12/L — ABNORMAL LOW (ref 3.95–5.13)
RED CELL DISTRIBUTION WIDTH: 14.5 % (ref 12.2–15.2)
WBC ADJUSTED: 8.8 10*9/L (ref 3.6–11.2)

## 2022-03-11 LAB — COMPREHENSIVE METABOLIC PANEL
ALBUMIN: 4 g/dL (ref 3.4–5.0)
ALKALINE PHOSPHATASE: 107 U/L (ref 46–116)
ALT (SGPT): 22 U/L (ref 10–49)
ANION GAP: 8 mmol/L (ref 5–14)
AST (SGOT): 25 U/L (ref <=34)
BILIRUBIN TOTAL: 0.4 mg/dL (ref 0.3–1.2)
BLOOD UREA NITROGEN: 18 mg/dL (ref 9–23)
BUN / CREAT RATIO: 20
CALCIUM: 9.4 mg/dL (ref 8.7–10.4)
CHLORIDE: 112 mmol/L — ABNORMAL HIGH (ref 98–107)
CO2: 26 mmol/L (ref 20.0–31.0)
CREATININE: 0.91 mg/dL
EGFR CKD-EPI (2021) FEMALE: 69 mL/min/{1.73_m2} (ref >=60)
GLUCOSE RANDOM: 91 mg/dL (ref 70–179)
POTASSIUM: 3.9 mmol/L (ref 3.4–4.8)
PROTEIN TOTAL: 7.6 g/dL (ref 5.7–8.2)
SODIUM: 146 mmol/L — ABNORMAL HIGH (ref 135–145)

## 2022-03-11 LAB — VITAMIN B12: VITAMIN B-12: 675 pg/mL (ref 211–911)

## 2022-03-11 LAB — CD34 ENUMERATION
CD34*: 0.13 %
CD45*: 100 %
PRE-APHERESIS 34: 11 {cells}/uL

## 2022-03-11 LAB — LIPID PANEL
CHOLESTEROL/HDL RATIO SCREEN: 3.3 (ref 1.0–4.5)
CHOLESTEROL: 190 mg/dL (ref <=200)
HDL CHOLESTEROL: 58 mg/dL (ref 40–60)
LDL CHOLESTEROL CALCULATED: 111 mg/dL — ABNORMAL HIGH (ref 40–99)
NON-HDL CHOLESTEROL: 132 mg/dL — ABNORMAL HIGH (ref 70–130)
TRIGLYCERIDES: 104 mg/dL (ref 0–150)
VLDL CHOLESTEROL CAL: 20.8 mg/dL (ref 11–41)

## 2022-03-11 LAB — LACTATE DEHYDROGENASE: LACTATE DEHYDROGENASE: 337 U/L — ABNORMAL HIGH (ref 120–246)

## 2022-03-11 MED ORDER — MOMELOTINIB 200 MG TABLET
ORAL_TABLET | Freq: Every day | ORAL | 2 refills | 30 days | Status: CP
Start: 2022-03-11 — End: ?
  Filled 2022-05-21: qty 30, 30d supply, fill #0

## 2022-03-11 NOTE — Unmapped (Addendum)
PLAN:   1) Seeing a neurologist is a good idea.   2) ENT is fine.   3) I will check B12 and MMA.   4) Switching to momalotinib is reasonable.    - We will need labs at 2, 4 6 weeks and then see you in 8 weeks.    - You can go from Rux to moma without tapering.    - This will allow for better control without the anemia.  The better control may also help with symptoms.   5) Interferon IF you showed signs of progression to myelofibrosis       We have a trial for ET patients using interferon.    Autoimmune are soft contraindications.   The main reason to do this trial is to look for a drop in JAK2 frequency     Romberg's Sign: This is falling with your eyes closed   - B12 Deficiency: This is correctable.    - Neurosyphilis    - Meniere's:    - CIDP: This is a nerve condition.    - Cerebellar conditions     All lab results last 24 hours:    Recent Results (from the past 24 hour(s))   Lipid panel    Collection Time: 03/11/22 10:09 AM   Result Value Ref Range    Triglycerides 104 0 - 150 mg/dL     Cholesterol 161 <=096 mg/dL    HDL 58 40 - 60 mg/dL    LDL Calculated 045 (H) 40 - 99 mg/dL    VLDL Cholesterol Cal 20.8 11 - 41 mg/dL    Chol/HDL Ratio 3.3 1.0 - 4.5    Non-HDL Cholesterol 132 (H) 70 - 130 mg/dL    FASTING Unknown    Lactate dehydrogenase    Collection Time: 03/11/22 10:09 AM   Result Value Ref Range    LDH 337 (H) 120 - 246 U/L    Comprehensive Metabolic Panel    Collection Time: 03/11/22 10:09 AM   Result Value Ref Range    Sodium 146 (H) 135 - 145 mmol/L    Potassium 3.9 3.4 - 4.8 mmol/L    Chloride 112 (H) 98 - 107 mmol/L    CO2 26.0 20.0 - 31.0 mmol/L    Anion Gap 8 5 - 14 mmol/L    BUN 18 9 - 23 mg/dL    Creatinine 4.09 8.11 - 1.02 mg/dL    BUN/Creatinine Ratio 20     eGFR CKD-EPI (2021) Female 69 >=60 mL/min/1.48m2    Glucose 91 70 - 179 mg/dL    Calcium 9.4 8.7 - 91.4 mg/dL    Albumin 4.0 3.4 - 5.0 g/dL    Total Protein 7.6 5.7 - 8.2 g/dL    Total Bilirubin 0.4 0.3 - 1.2 mg/dL    AST 25 <=78 U/L    ALT 22 10 - 49 U/L    Alkaline Phosphatase 107 46 - 116 U/L   CBC w/ Differential    Collection Time: 03/11/22 10:09 AM   Result Value Ref Range    WBC 8.8 3.6 - 11.2 10*9/L    RBC 3.74 (L) 3.95 - 5.13 10*12/L    HGB 11.9 11.3 - 14.9 g/dL    HCT 29.5 (L) 62.1 - 44.0 %    MCV 89.5 77.6 - 95.7 fL    MCH 31.7 25.9 - 32.4 pg    MCHC 35.5 32.0 - 36.0 g/dL    RDW 30.8 65.7 - 84.6 %  MPV 7.7 6.8 - 10.7 fL    Platelet 659 (H) 150 - 450 10*9/L This is slightly higher. We like < 650 - 600. The only way to get there is to increase the drug. ---> you get more anemic.     Momelotinib is here: It is also a JAK2 inhbitor.  It does not block pathways with red cell production     We have a program to help people get.     Neutrophils % 73.1 %    Lymphocytes % 16.8 %    Monocytes % 7.6 %    Eosinophils % 1.7 %    Basophils % 0.8 %    Absolute Neutrophils 6.4 1.8 - 7.8 10*9/L    Absolute Lymphocytes 1.5 1.1 - 3.6 10*9/L    Absolute Monocytes 0.7 0.3 - 0.8 10*9/L    Absolute Eosinophils 0.1 0.0 - 0.5 10*9/L    Absolute Basophils 0.1 0.0 - 0.1 10*9/L

## 2022-03-11 NOTE — Unmapped (Signed)
Less tired  Balance better but still an isses  Brain si better   Close eyes - lose bearing    - no vertigo   - Close eyes     Car  Tinnuts ?   Truncal ataxia     ID: Christy Buchanan is a 67 y.o. with a JAK2+ MPN    DZ CHAR: ET (JAK2+)  BM Bx (2018):  Increased atypical megakaryocytes with clustering  2% blasts  JAK2 mutation (VAF 3.5%)  Nl karyotype   Cellularity from 40 - 50%  Patchy mild increase in reticulin; no increase in collagen; no fibrosis  No monoclonal B or T cell populations  IPSET 2: High risk (age, JAK2)   Tx:   Hydrea (DC'd due to inc MPN Sx  Ruxolitnib    ASSESSMENT:   Christy Buchanan is a 67 y.o. 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  At the time of her diagnosis, she had no circulating immature or CD34+ cells, no increase in fibrosis, a normal LDH, and no palpable splenomegaly.      She was transitioned from hydroxyurea to ruxolitinib due to increased MPN related sx.  This change reduced her MPN symptom score initially from 48 to 21.  Since then, her score has continued rise though it is not clear if this is due to her MPN.  At her current dose, her platelets are well controlled.     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  Her clinical sx and MRI are c/w this diagnosis.  She has an appt with a neurologist soon.      PLAN:  1) Increase ruxolitnib to 10 mg BID.   2) Continue ASA 81mg  PO daily  3) Return in 6 months  Lipid check  CD34 check  CBC, CMP, LDH.     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  04/2016: Began Hydrea at 500 mg every day; increased to 1000 mg  05/2016: BM biopsy   Increased atypical megakaryocytes with clustering  No fibrosis, 2% blasts  JAK2 mutation (VAF 3.5%)  Nl karyotype   Cellularity from 40 - 50%  Patchy mild increase in reticulin; no increase in collagen  No monoclonal B or T cell populations    03/15/19: Seen at [facility]  CBC: 7.3/12.0/312  LDH: WNL  CD34: 0.02  VWF: Normal   MPN score: 48 to 49  Began ruxolitinib 10 mg BID    04/05/19  CBC: 7.3/12.0/312  MPN score: 36 - 38  Ruxolitinib 10 mg BID; Hydrea was stopped     05/17/19: Ruxolitinib 20 mg BID  LDH: 1.30 x ULN  MPN Score: 27    05/31/19: Ruxolitinib 15 mg BID (decrease due to anemia of 10.6)    07/12/19: Ruxolitinib 10 mg BID (decrease due to anemia of 10.4, plts 477); added Hydrea 500 mg a day   CD34: 0.12  LDH: 1.42 x ULN  MPN Score: 32    10/25/19: Ruxolitinib 5 mg BID (continued anemia); Hydrea at 500 mg   LDH: 1.29 x ULN  MPN Score: 31    03/01/19:  Ruxolitinib 5 mg BID; Hydrea at 500 mg   LDH: 1.39 x ULN  MPN Score: 23    05/09/20:  Ruxolitinib 5mg  qAM and 10mg  qPM;  Stopped hydrea    06/12/20: Ruxolitinib 5mg  qAM and 10mg  qPM;  LDH 1.39 x ULN  MPN score: 34 but reports feeling better than last visit even  with higher score.    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   The remainder of her ROS is given below.     MPN 10 Score  Symptom (Absent 0 to 10 worst imaginable) 2/21 2/21 4/21 6/21 9/21  1/22  5/22 1/23 8/23    Fatigue in the past 24 hours 6 9 7 9 7  8 6  5 5 5    Filling up quickly when you eat (Early satiety)  7 7 5 5 5 5 4   0 5 5   Abdominal discomfort  3 4 4 4 4 5  4  2   0 After surgery 6   Inactivity 5 5 - 6 5 7 5 4  4  4 2  Walked yest - exercise 3   Problems with concentration - 6 7-8 6 7 6 2  5  3 6 6    Numbness/ Tingling (in my hands and feet) 5 to 6 0 0 0 0 0  0  0 0 0   Night sweats 0 0 1 0 0 0  1 0 4 2-3   Itching (pruritus) 10 7 0 0 3 3   7 7 7 5    Bone pain (diffuse not joint pain or arthritis) 6 6 3  0 1 0  3  0 2 -3 0   Fever (>100 F) 0 0 0 0 0 0  0  0 0 0   Unintentional weight loss last 6 months 0 0 0 0 0 0  0  0 1 - 2 0   TOTAL 48 - 49 36 - 38 27 32 31 27   34 21 32 - 34      PHYSICAL EXAM:  VS: As recorded in Epic  GENERAL: She appears well   HEENT: No trauma; OP is clear  LYMPH NODES: No LAN  LUNGS: CTA  COR:RRR; no m/r/g  ABD: NTND; no HSM  EXT: No edema  NEURO: Slightly wide based; slow gait    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; PR pos, positive margin  L Mastectomy - no malignancy; neg LN  Txed with Surgery, radiation, auto BMT, raloxifene  Negative WU  for 28 genes associated with hereditary cancers  Raloxifene  Sjogren's syndrome  Autoimmune thyroiditis - Txed with methimazole   GERD - Txed with PPI  Mixed hyperlipidemia - Tx with Lovaza, pravastatin     FHx:  Daughter w/ spina bifida, BRCA 1081G>A mutation (w/o cancer)      2016 WHO Diagnostic Criteria for MPNs  ET  Major Criteria  Plts > 450  BM biopsy showing proliferation mainly of the megakaryocyte lineage with increased numbers of enlarged, mature megakaryocytes with hyperlobulated nuclei. No significant left-shift of neutrophil granulopoiesis or erythropoiesis and very rarely minor (grade 1) increase in reticulin fibers  Not meeting WHO criteria for BCR-ABL1?+?CML, PV, PMF, MDS, or other myeloid neoplasms)  Presence of JAK2, CALR or MPL mutation    Minor Criteria  Presence of a clonal marker (  e.g., abnormal karyotype)  Absence of evidence for reactive thrombocytosis    Diagnosis: All major criteria or first three major and one minor     PMF  Major criteria  Proliferation and atypia of megakaryocytes accompanied by either reticulin and/or collagen fibrosis  grades 2 or 3 on a scale of 0 to 3  Not meeting WHO criteria for ET, PV, BCR-ABL1+ CML, myelodysplastic syndromes, or other myeloid neoplasm  Presence of JAK2, CALR or MPL mutation or in the absence of these mutations, presence of another clonal marker or absence of reactive myelofibrosis    Minor Criteria  Anemia neoplasms)  Presence of JAK2, CALR or MPL mutation    Minor Criteria  Presence of a clonal marker (e.g., abnormal karyotype)  Absence of evidence for reactive thrombocytosis    Diagnosis: All major criteria or first three major and one minor     PMF  Major criteria  Proliferation and atypia of megakaryocytes accompanied by either reticulin and/or collagen fibrosis  grades 2 or 3 on a scale of 0 to 3  Not meeting WHO criteria for ET, PV, BCR-ABL1+ CML, myelodysplastic syndromes, or other myeloid neoplasm  Presence of JAK2, CALR or MPL mutation or in the absence of these mutations, presence of another clonal marker or absence of reactive myelofibrosis    Minor Criteria  Anemia not attributed to another comorbid condition  WBC > 11.0  Palpable Splenomegaly   LDH > ULN  Leukoerythroblastosis     Diagnosis: 3 major and at least 1 minor confirmed in two consecutive determinations    IPSET Model for Prediction of Thrombosis    Age:    2   < 60: 0   > 60: 2  WBC count (x10e9)  0   < 11: 0   > 11: 1  Hx of Thrombosis  0   No: 0   Yes: 1  --------------------------------------------  Total:    2     10 yr thrombosis risk, median survival (yrs)  Lo Risk: 0 pts    --> 89%, NR  Int Risk: 1-2 pts --> 86%, 24.5  Hi Risk: 3-4 pts  --> 69%, 14.7    IPSET II  Age:    1   < 60: 0   > 60: 1  CVS Risk Factor*  0   No: 0   Yes: 1   Hx of Thrombosis  0   No: 0   Yes: 2  JAK2     2   No: 0   Yes: 2  --------------------------------------------  Total:    3    *Gave up smoking 10 years ago, near optimal non-LDL on treatment, non HTN    Annual Thrombosis Risk  Lo Risk: 0-1 pts  --> 1.03%  Int Risk: 2 pts     --> 2.35%  Hi Risk: 3-6 pts  --> 3.56%     NOTE  JAK2 conveys a higher risk of thrombosis  CALR conveys a higher risk of bleeding    Newer Model:  [email or web address]    Primary prophylaxis of thrombosis  All patients: Aspirin unless    VWF activity <30%,   Platelet >?1 million,   CALR-mutated low-risk ET    PV with Hct >?45%: Add phlebotomy/cytoreduction to target Hct <?45%  Age >?60 years and/or prior history of thrombosis: Add cytoreduction  Secondary prophylaxis after thrombotic event  All patients: Cytoreduction  Typical VTE: Consider indefinite VKA for most patients; aspirin if not on VKA  Atypical VTE:  Indefinite VKA  Arterial thrombosis: Aspirin

## 2022-03-11 NOTE — Unmapped (Unsigned)
Patient stated that she had a stunt put in November, she have still been wobbly and she is not back to normal yet.

## 2022-03-12 NOTE — Unmapped (Signed)
Here's the plan for Ms Lacks:     I would like to switch to momalotinib.  I have been unable to find a dose of ruxolitinib that controlled her platelets and MPN sx and did not give her anemia.  I have sent is a script for the 200 mg BID dose.  She was on only 10 mg BID of Rux.  I told her that we would get labs at 2, 4, and 6 weeks; we can see her in 8 weeks.     Let me know if I need to do anything.

## 2022-03-15 DIAGNOSIS — D7581 Myelofibrosis: Principal | ICD-10-CM

## 2022-03-15 DIAGNOSIS — Z1589 Genetic susceptibility to other disease: Principal | ICD-10-CM

## 2022-03-24 NOTE — Unmapped (Signed)
[facility] Refill Coordination Note    Specialty Medication(s) to be Shipped:   Hematology/Oncology: Christy Buchanan    Other medication(s) to be shipped: No additional medications requested for fill at this time     Christy Buchanan, DOB: March 14, 1955  Phone: [phone] (home)       All above HIPAA information was verified with patient.     Was a Nurse, learning disability used for this call? No    Completed refill call assessment today to schedule patient's medication shipment from the [facility] [phone]).  All relevant notes have been reviewed.     Specialty medication(s) and dose(s) confirmed: Regimen is correct and unchanged.   Changes to medications: Christy Buchanan reports no changes at this time.  Changes to insurance: No  New side effects reported not previously addressed with a pharmacist or physician: None reported  Questions for the pharmacist: No    Confirmed patient received a Conservation officer, historic buildings and a Surveyor, mining with first shipment. The patient will receive a drug information handout for each medication shipped and additional FDA Medication Guides as required.       DISEASE/MEDICATION-SPECIFIC INFORMATION        N/A    SPECIALTY MEDICATION ADHERENCE     Medication Adherence    Patient reported X missed doses in the last month: 0  Specialty Medication: Jakafi 10 mg  Patient is on additional specialty medications: No  Informant: patient          Were doses missed due to medication being on hold? No    Jakafi 10 mg: 16 days of medicine on hand     REFERRAL TO PHARMACIST     Referral to the pharmacist: Not needed      [facility]     Shipping address confirmed in Epic.     Delivery Scheduled: Yes, Expected medication delivery date: 03/31/22.     Medication will be delivered via Next Day Courier to the prescription address in Epic WAM.    Christy Buchanan   [facility] Specialty Pharmacist

## 2022-03-26 NOTE — Unmapped (Addendum)
I left a message for patient to call for a follow up appt with Christy Buchanan or Dr. Joelene Millin available.    ----- Message from Thomasena Edis, RN sent at 03/25/2022  9:06 AM EST -----  Needs appointment ASAP with Wilmon Pali (first available) for shunt adjustment.    Thanks  Dewayne Hatch

## 2022-03-27 MED ORDER — ATORVASTATIN 40 MG TABLET
ORAL_TABLET | Freq: Every day | ORAL | 1 refills | 90 days | Status: CP
Start: 2022-03-27 — End: 2023-03-27

## 2022-03-30 MED FILL — JAKAFI 10 MG TABLET: ORAL | 30 days supply | Qty: 60 | Fill #6

## 2022-03-31 ENCOUNTER — Ambulatory Visit: Admit: 2022-03-31 | Discharge: 2022-04-01 | Payer: MEDICARE

## 2022-03-31 DIAGNOSIS — E032 Hypothyroidism due to medicaments and other exogenous substances: Principal | ICD-10-CM

## 2022-03-31 DIAGNOSIS — E0500 Thyrotoxicosis with diffuse goiter without thyrotoxic crisis or storm: Principal | ICD-10-CM

## 2022-03-31 LAB — TSH: THYROID STIMULATING HORMONE: 2.209 u[IU]/mL (ref 0.550–4.780)

## 2022-03-31 NOTE — Unmapped (Signed)
It was a pleasure seeing you today at our [facility].   If any testing was ordered today, most of the time the results will appear in your MyChart account even before I see them. Once all your results are available, I will contact you within 1-2 weeks with an explanation. Please contact me if you do not hear from me by then.   Please make sure you have access to MyChart since this is how I will communicate with you.  If you do not have MyChart, I will send you a letter which may take 1-2 weeks to arrive.      Remember to call us with any urgent issues. Please allow a few days for mychart messages to be read and answered.     If you are seen for diabetes, please bring your glucometer and all devices to every visit.   All refill requests must be submitted 14 days in advance to allow enough time to be processed.

## 2022-03-31 NOTE — Unmapped (Signed)
Reason for follow up: hyperthyroidism.    Referring Provider: Timoteo Ace Dev*    Primary Care Provider: Milus Banister, DO      Assessment/Plan:     1. 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 She has been on low dose methimazole 2.5 mg every day since then.   - Recently has been having worsening fatigue and TFTs in mild hypothyroid ranges. Additional labs showed negative TPO, TSI and TRAb antibodies. MMZ stopped 01/2021 and subsequent TFTs remain  normal  - clinically euthyroid today  Plan:  - re-check TFTs today. If still normal, will re-check again  every 6-12 mo. Patient would prefer to continue monitoring TSH with me instead of PCP  - patient knows to call and/or RTC if having any new and/or progressive symptoms of hypo or hyperthyrodism    2. Hypothyroidism due to medication  - see above      All questions were answered and patient agrees with plan.   Return in about 6 months (around 09/29/2022) for [employer], In-person.      Thank you for referring your patient to our endocrine clinic for evaluation. Please do not hesitate to contact me with any questions.     Tawanna Sat, MD  [facility]  Phone [phone]  Fax [phone]    Subjective:       Christy Buchanan is a 67 y.o. 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 Last seen on 08/19/21    Interval hx 03/31/22    Patient reports since last seen had a fall and was diagnosed with NPH and is s/p VP shunt, she reports doing well and following with  Neurosurgery tomorrow.       Interval hx 08/19/21    Patient remains off MMZ. Last TFTs on 05/2021 overall normal. She feels at her baseline.     Of notice, reports recent episode of vertigo and fall. Not currently.         Interval hx 05/14/21    Patient feels better now that she is off MMZ, with improved depression symptoms and fatigue. Overall doing well.     Initial encounter 01/21/22  Patient has long standing history of hyperthyroidism due to Graves disease last seen at [facility] on 12/2017 by Dr. Tedd Sias, she was on low dose methimazole 2.5 mg every day.      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     The patient reports   Anterior neck pain: no  Anterior neck swelling: no  Recent URI: no  Dysphagia: no  Hoarseness: no  Tremors: no  Palpitations: no  Weight loss: no  Change in bowel habits: no  Energy: fatigue   Insomnia: no  Change in vision or diplopia or eye discomfort: no    Periods: post-menopause    She denies taking lithium, amiodarone, biotin supplements or thyroid supplements.      FH: mother she had thyroid cancer s/p hemithyroidectomy.      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    Placed 11.2023  [place]  150 is setting 11.2023    Vitamin D deficiency        No Known Allergies  Family History   Problem Relation Age of Onset    Cancer Mother     Heart disease Mother     Cancer Father     Breast cancer Neg Hx      Social History     Socioeconomic History    Marital status: Divorced     Spouse name: None    Number of children: None    Years of education: None    Highest education level: None   Tobacco Use    Smoking status: Former     Types: Cigarettes    Smokeless tobacco: Never   Vaping Use    Vaping status: Never Used   Substance and Sexual Activity    Alcohol use: Yes     Comment: rare     Social Determinants of Health     Financial Resource Strain: Medium Risk (08/20/2021)    Overall Financial Resource Strain (CARDIA)     Difficulty of Paying Living Expenses: Somewhat hard   Food Insecurity: No Food Insecurity (08/20/2021)    Hunger Vital Sign     Worried About Running Out of Food in the Last Year: Never true     Ran Out of Food in the Last Year: Never true   Transportation Needs: No Transportation Needs (08/20/2021)    PRAPARE - Therapist, art (Medical): No     Lack of Transportation (Non-Medical): No      Past Surgical History:   Procedure Laterality Date    AUGMENTATION MAMMAPLASTY Bilateral     24ish yrs    BREAST BIOPSY Left     malignant    CHEMOTHERAPY      MASTECTOMY Left 25 ish yrs ago    PR CREATE SHUNT:VENTRIC-PERITONEAL Right 12/30/2021    Procedure: CREAT SHUNT; VENTRICULO-PERITONEAL/PLEURAL;  Surgeon: Francisca December, MD;  Location: MAIN OR ;  Service: Neurosurgery    PR LAP, SURG ENTEROLYSIS Right 12/30/2021    Procedure: LAPAROSCOPY, SURGICAL, ENTEROLYSIS (FREEING OF INTESTINAL ADHESION) (SEPARATE PROCEDURE);  Surgeon: Katherina Mires, MD;  Location: MAIN OR [facility];  Service: Trauma    RADIATION Left         Current Outpatient Medications:     acetaminophen (TYLENOL) 325 MG tablet, Take 2 tablets (650 mg total) by mouth every four (4) hours as needed for fever (temp >38.5C)., Disp: , Rfl: 0    atorvastatin (LIPITOR) 40 MG tablet, Take 1 tablet (40 mg total) by mouth daily., Disp: 90 tablet, Rfl: 1    cetirizine (ZYRTEC) 10 MG tablet, Take 1 tablet (10 mg total) by mouth daily., Disp: , Rfl:     citalopram (CELEXA) 10 MG tablet, Take 3 tablets (30 mg total) by mouth Three (3) times a day before meals. (Patient taking differently: Take 1 tablet (10 mg total) by mouth Three (3) times a day before meals. 10 mg in AM, 20 mg in PM), Disp: 90 tablet, Rfl: 0 docusate (COLACE) 50 mg/5 mL liquid, Take 5 mL (50 mg  total) by mouth daily., Disp: , Rfl:     meclizine (ANTIVERT) 12.5 mg tablet, Take 1 tablet (12.5 mg total) by mouth Three (3) times a day as needed for dizziness or nausea., Disp: 30 tablet, Rfl: 1    momelotinib (OJJAARA) 200 mg tablet, Take 1 tablet (200 mg total) by mouth daily. Swallow tablets whole; do not cut, crush, or chew., Disp: 30 tablet, Rfl: 2    multivitamin (TAB-A-VITE/THERAGRAN) per tablet, Take 1 tablet by mouth daily., Disp: , Rfl:     naproxen sodium (ALEVE) 220 mg cap, Take by mouth., Disp: , Rfl:     omega-3 fatty acids-fish oil 300-1,000 mg cap capsule, Take 1 capsule by mouth in the morning., Disp: , Rfl:     omeprazole (PRILOSEC) 20 MG capsule, TAKE 1 CAPSULE(20 MG) BY MOUTH DAILY, Disp: 90 capsule, Rfl: 2    raloxifene (EVISTA) 60 mg tablet, Take 1 tablet (60 mg total) by mouth daily., Disp: 90 tablet, Rfl: 3    ruxolitinib (JAKAFI) 10 mg tablet, Take 1 tablet (10 mg total) by mouth Two (2) times a day., Disp: 60 tablet, Rfl: 11    simethicone (MYLICON) 80 MG chewable tablet, Chew 1 tablet (80 mg total) every six (6) hours as needed for flatulence., Disp: , Rfl:     vitamins B1 B6 B12 Liqd, Take by mouth daily., Disp: , Rfl:       Review of Systems  A 12 point review of systems was otherwise negative except as noted in the HPI.      Objective:      BP 120/63  - Pulse 67  - Ht 162.6 cm (5' 4)  - Wt 59.6 kg (131 lb 8 oz)  - BMI 22.57 kg/m??     Wt Readings from Last 3 Encounters:   03/31/22 59.6 kg (131 lb 8 oz)   03/11/22 59.9 kg (132 lb 0.9 oz)   01/12/22 59.7 kg (131 lb 11.2 oz)      BMI Readings from Last 3 Encounters:   03/31/22 22.57 kg/m??   03/11/22 22.67 kg/m??   01/12/22 22.61 kg/m??      GEN: appears well, in NAD  HEENT: sclerae anicteric  NECK:  no visible neck mass or deformity, no palpable thyroid mass  CHEST: normal breathing chest movements  NEURO: Aox3, following commands  PSYCH: normal affect.  SKIN: no visible rash     Lab Reviewed:  Component      Latest Ref Rng 08/22/2021   TSH      0.550 - 4.780 uIU/mL 2.439        Component      Latest Ref Rng 01/21/2021 02/27/2021   TSH      0.550 - 4.780 uIU/mL 7.668 (H)  3.748    Free T4      0.89 - 1.76 ng/dL 1.61  0.96 (L)    T3, Total      60.0 - 180.0 ng/dL 045.4     Thyroid Stimulating Immunoglob      <=1.3 TSI index <1.0     Thyrotropin Receptor Ab      0.00 - 1.75 IU/L <1.10     Thyroid Peroxidase Ab      <=60 U/mL 48        (H) High  (L) Low    TSH (uIU/mL)   Date Value   03/31/2022 2.209   08/22/2021 2.439   08/19/2021 2.283   05/14/2021 2.073   02/27/2021 3.748   05/07/2020 5.280 (  H)     T3, Free (pg/mL)   Date Value   08/29/2020 2.82     Free T4 (ng/dL)   Date Value   16/11/9602 0.86 (L)   02/27/2021 0.86 (L)   01/21/2021 0.98   08/29/2020 0.97   03/15/2019 0.85     Thyroid Peroxidase Ab (U/mL)   Date Value   01/21/2021 48     Thyroid Stimulating Immunoglob (TSI index)   Date Value   01/21/2021 <1.0     Labs from 2015 TSH < 0.02,  2016 at [facility] showed positive Tg Ab and TPO ab     Radiology reviewed:  ([facility]) Neck ultrasound in 01/2015 showed heterogenous appearing thyroid without nodules.

## 2022-04-01 ENCOUNTER — Ambulatory Visit: Admit: 2022-04-01 | Discharge: 2022-04-02 | Payer: MEDICARE

## 2022-04-01 DIAGNOSIS — R4189 Other symptoms and signs involving cognitive functions and awareness: Principal | ICD-10-CM

## 2022-04-01 DIAGNOSIS — G912 (Idiopathic) normal pressure hydrocephalus: Principal | ICD-10-CM

## 2022-04-01 NOTE — Unmapped (Signed)
We changed your shunt setting to 110 today.   If you feel like this is not working in about 2 weeks then we can discuss changing the setting again.

## 2022-04-01 NOTE — Unmapped (Signed)
Christy Buchanan      Neurosurgery Clinic Note - Established Patient      Assessment/Recommendations:    Patient Active Problem List   Diagnosis    Sleep disorder    Abnormal Pap smear of cervix    Family history of colon cancer    Family history of thyroid cancer    Gastroesophageal reflux disease    History of breast cancer    Mixed hyperlipidemia    Postmenopausal osteoporosis    Pure hypercholesterolemia    Sjogrens syndrome (CMS-HCC)    Vitamin D deficiency    Depression, major, single episode, complete remission (CMS-HCC)    Essential thrombocythemia (CMS-HCC)    Acquired hypothyroidism    JAK-2 gene mutation    MPN (myeloproliferative neoplasm) (CMS-HCC)    Loss of consciousness (CMS-HCC)    NPH (normal pressure hydrocephalus) (CMS-HCC)    Atypical meningioma of brain (CMS-HCC)    Red blood cell antibody positive    Ventriculo-peritoneal shunt status     Christy Buchanan is a 67 year old female with a history of normal pressure hydrocephalus and VP shunt placed 12/30/2021.  We discussed her symptoms today and she feels she has regressed a bit since surgery.  We discussed the option to change the shunt setting today and she would like to do that.  She understands there is a small risk of subdural hematomas.  I changed the shunt setting from 150-110.  I will see her again in about 2-3 weeks and we will assess her progress at that time.  She and her daughter are interested in a referral to neurology to assess her cognition with regards to her difficulties with driving.  I agree with this plan and placed a referral to neurology today.      Interval History    Christy Buchanan is a 67 y.o. female who is seen in neurosurgery clinic for a follow-up visit shunt setting adjustment.  She has a history of normal pressure hydrocephalus and underwent VP shunt placement on 12/30/2021.  She has a Christy Buchanan valve set to 150.  After surgery, she noted improvement in cognition, alertness, balance, degree of urinary frequency.  However, she and her daughter regressed.  She has had some falls related to balance difficulties.  Her mobility is slightly more limited when it was in December.  She is having difficulties with driving.  She notes that she has a hard time driving in unfamiliar areas.  She is interested in adjusting the shunt setting.    Past Medical History  I reviewed the past medical history and problem list in [email or web address]   Past Medical History:   Diagnosis Date    Autoimmune thyroiditis     Breast cancer (CMS-HCC)     Difficult intravenous access     GERD (gastroesophageal reflux disease)     Hyperlipidemia     Osteoporosis     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    Sjogren's disease (CMS-HCC)     Thrombocythemia     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    Placed 11.2023  [place]  150 is setting 11.2023    Vitamin D deficiency        Past Surgical History  I reviewed the documented surgical history  Past Surgical History:   Procedure Laterality Date    AUGMENTATION MAMMAPLASTY Bilateral     24ish yrs    BREAST BIOPSY Left     malignant    CHEMOTHERAPY  MASTECTOMY Left 25 ish yrs ago    PR CREATE SHUNT:VENTRIC-PERITONEAL Right 12/30/2021    Procedure: CREAT SHUNT; VENTRICULO-PERITONEAL/PLEURAL;  Surgeon: Francisca December, MD;  Location: MAIN OR [name];  Service: Neurosurgery    PR LAP, SURG ENTEROLYSIS Right 12/30/2021    Procedure: LAPAROSCOPY, SURGICAL, ENTEROLYSIS (FREEING OF INTESTINAL ADHESION) (SEPARATE PROCEDURE);  Surgeon: Katherina Mires, MD;  [facility];  Service: Trauma    RADIATION Left          Allergies and Medications   reveiwed in [email or web address]     Social History:  Accompanied by her daughter  does not smoke  does not drink alcohol    Family History:  Family history reviewed in [email or web address]  record. and The was no contributing family history.    Review of Systems  A 10-system review of systems was conducted and was negative except as documented above in the HPI. The patient was encouraged to discuss non-neurosurgical issues with their PCP    Objective     Physical Exam    Vital Signs: Stable today, reviewed in Epic@     General Appearance:  NAD. Sitting comfortably in exam room.    The patient was awake, alert and oriented ??3.  Naming, repetition and fund of knowledge were intact.  The patient was a good historian for recent and remote events surrounding their illness. Followed commands easily.    Cranial nerves: PERRLA. Extraocular movements were full, no nystagmus. Peripheral fields intact to confrontation. Facial sensation was intact, facial movement was symmetric, phonation was normal, tongue was midline, shoulder shrug symmetric.    Motor exam: The patient had good strength bilaterally.  Sensation:  Intact to light touch  Gait  was stable.  Coordination was intact    CV: Pink and well-perfused, brisk capillary fill no edema  RESP: Breathing is even and nonlabored, acyanotic  GI: Abdomen is soft nontender, nondistended  SKIN: Intact with no evidence of rashes or lesions     Reprogramming of CSF Shunt    Performing Service: Neurosurgery  Patient Location: Clinic    Reprogramming of CSF Shunt    Indications: Concern for overdrainage     Procedure: Reprogramming of CSF Shunt    Shunt Type: sophysa Buchanan  Documented Previous Setting: 150  Actual Setting on Interrogation: 150  New Setting: 110    Using the reprograming device, the shunt was set to 110.    Complications: None    Specimens: None        Test Results  Tests listed below were personally reviewed by me.  No new imaging for review today

## 2022-04-16 NOTE — Unmapped (Signed)
Left VM for patient to return my call to my direct line

## 2022-04-17 NOTE — Unmapped (Signed)
Spoke with patient about Questionnaire for up coming appointment. She and her daughter will complete Questionnaire via my chart and daughter will be attending appointment with her.

## 2022-04-23 NOTE — Unmapped (Signed)
[facility] Refill Coordination Note    Specialty Medication(s) to be Shipped:   Hematology/Oncology: Christy Buchanan    Other medication(s) to be shipped: No additional medications requested for fill at this time     Christy Buchanan, DOB: 28-Mar-1955  Phone: [phone] (home)       All above HIPAA information was verified with patient.     Was a Nurse, learning disability used for this call? No    Completed refill call assessment today to schedule patient's medication shipment from the [facility] [phone]).  All relevant notes have been reviewed.     Specialty medication(s) and dose(s) confirmed: Regimen is correct and unchanged.   Changes to medications: Christy Buchanan reports no changes at this time.  Changes to insurance: No  New side effects reported not previously addressed with a pharmacist or physician: None reported  Questions for the pharmacist: No    Confirmed patient received a Conservation officer, historic buildings and a Surveyor, mining with first shipment. The patient will receive a drug information handout for each medication shipped and additional FDA Medication Guides as required.       DISEASE/MEDICATION-SPECIFIC INFORMATION        N/A    SPECIALTY MEDICATION ADHERENCE     Medication Adherence    Patient reported X missed doses in the last month: 0  Specialty Medication: Jakafi 10 mg  Patient is on additional specialty medications: No  Informant: patient              Were doses missed due to medication being on hold? No    Jakafi 10 mg: 7 days of medicine on hand        REFERRAL TO PHARMACIST     Referral to the pharmacist: Not needed      [facility]     Shipping address confirmed in Epic.     Patient was notified of new phone menu : No    Delivery Scheduled: Yes, Expected medication delivery date: 04/30/22.     Medication will be delivered via Next Day Courier to the prescription address in Epic W[facility].    Willette Pa   [facility] Specialty Technician

## 2022-04-27 ENCOUNTER — Ambulatory Visit: Admit: 2022-04-27 | Discharge: 2022-04-28 | Payer: MEDICARE
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
## 2022-04-29 MED FILL — JAKAFI 10 MG TABLET: ORAL | 30 days supply | Qty: 60 | Fill #7

## 2022-05-04 ENCOUNTER — Ambulatory Visit: Admit: 2022-05-04 | Discharge: 2022-05-05 | Payer: MEDICARE

## 2022-05-04 DIAGNOSIS — G912 (Idiopathic) normal pressure hydrocephalus: Principal | ICD-10-CM

## 2022-05-04 NOTE — Unmapped (Signed)
Christy Buchanan      Neurosurgery Clinic Note - Established Patient      Assessment/Recommendations:    Patient Active Problem List   Diagnosis    Sleep disorder    Abnormal Pap smear of cervix    Family history of colon cancer    Family history of thyroid cancer    Gastroesophageal reflux disease    History of breast cancer    Mixed hyperlipidemia    Postmenopausal osteoporosis    Pure hypercholesterolemia    Sjogrens syndrome (CMS-HCC)    Vitamin D deficiency    Depression, major, single episode, complete remission (CMS-HCC)    Essential thrombocythemia (CMS-HCC)    Acquired hypothyroidism    JAK-2 gene mutation    MPN (myeloproliferative neoplasm) (CMS-HCC)    Loss of consciousness (CMS-HCC)    NPH (normal pressure hydrocephalus) (CMS-HCC)    Atypical meningioma of brain (CMS-HCC)    Red blood cell antibody positive    Ventriculo-peritoneal shunt status     Patient left before I could see her today. She was given the choice to be seen or reschedule and chose to leave and reschedule.     Interval History    Christy Buchanan is a 67 y.o. female who is seen in neurosurgery clinic for shunt evaluation.    She has seen Dr. Hope Budds since our previous visit, who recommended neuropsych testing for her cognitive decline.

## 2022-05-06 NOTE — Unmapped (Signed)
[facility] Specialty Medication Onboarding    Specialty Medication: Ojjaara  Prior Authorization: Approved   Financial Assistance: No - copay  <$25  Final Copay/Day Supply: $0 / 30    Insurance Restrictions: None     Notes to Pharmacist:     The triage team has completed the benefits investigation and has determined that the patient is able to fill this medication at [facility]. Please contact the patient to complete the onboarding or follow up with the prescribing physician as needed.

## 2022-05-07 NOTE — Unmapped (Addendum)
[facility]   Patient Onboarding/Medication Counseling    Christy Buchanan is a 67 y.o. female with Essential thrombocythemia deficiency who I am counseling today on initiation of therapy.  I am speaking to the patient.    Was a Nurse, learning disability used for this call? No    Verified patient's date of birth / HIPAA.    Specialty medication(s) to be sent: Hematology/Oncology: Ojjaara      Non-specialty medications/supplies to be sent: n/a      Medications not needed at this time: n/a         Ojjaara (momelotinib)    Medication & Administration     Dosage:   Myelofibrosis: Take 1 tablet (200 mg) once daily until disease progression or unacceptable toxicity     Administration:   Take with or without food  Swallow tablets whole; do not cut, crush or chew  Adherence/Missed dose instructions:  Skip the missed dose and go back to your normal time  Do not take 2 doses at the same time or extra doses    Goals of Therapy   Treatment of intermediate or high-risk myelofibrosis (MF), including primary MF or secondary MF (post-polycythemia vera [PV] and post-essential thrombocythemia [ET]), in adults with anemia.    Side Effects & Monitoring Parameters   List common side effects:  Cardiovascular: Hypotension (14%), peripheral edema (11%)  Dermatologic: Pruritus (11%), skin rash (6% to 12%)  Gastrointestinal: Abdominal pain (13% to 18%), diarrhea (20% to 22%) nausea (16% to 20%)  Nervous system: Dizziness (8% to 24%), fatigue (21% to 22%), headache (11%)  Neuromuscular & skeletal: Limb pain (12%)  Respiratory: Cough (8% to 14%)  Miscellaneous: Fever (10% to 12%)    The following side effects should be reported to the provider:  Signs of an allergic reaction  Signs of an infection: Infection (38%; including bacterial infection [15% to 21%], fungal infection [excluding opportunistic infections: <5%], kidney infection (UTI) [?12%], serious infection [13%], viral infection [6% to 12%])  Signs of unusual bleeding or bruising (Hemorrhage (21% to 22%), thrombocytopenia (21% to 28%)  Signs of kidney problems (Unable to pass urine, change in amount of urine passed, blood in urine, significant weight gain in short period of time)  Signs of liver problems (dark urine, light colored stool, yellowing of skin or eyes) (Increase in LFTs (23%) and serum bilirubin (16%)  Abnormal heartbeat  Severe dizziness or passing out  Burning, numbness or tingling feeling that is not normal  Call your doctor right away if you have signs of a blood clot like chest, arm, back, neck, or jaw pain or pressure; coughing up blood; numbness or weakness on 1 side of your body; trouble speaking or thinking; change in balance; change in eyesight; shortness of breath; or swelling, warmth, or pain in the leg or arm.    Monitoring Parameters:  CBC with differential (baseline, periodically during treatment, and as clinically indicated)  Hepatic function tests (baseline, every month for 6 months during treatment, then periodically as clinically indicated).   Monitor for signs/symptoms of infection, cardiovascular adverse events, thrombosis, and secondary malignancies. Monitor adherence.  Momelotinib HBV monitoring: Monitor for signs/symptoms of hepatitis B infection (including reactivation).    Contraindications, Warnings, & Precautions   Cardiac effects: Consider individual patient benefits versus cardiovascular risks prior to initiating or continuing momelotinib, particularly in patients with current (or history of) smoking or with other cardiovascular risk factors.   Hematologic toxicity: Momelotinib may cause thrombocytopenia and neutropenia;   Hepatotoxicity: Cases of reversible  drug-induced liver injury have been observed with momelotinib. The median time to onset of any grade transaminase elevation was 2 months, with most cases occurring within 4 months.  Infections: Serious infections (eg, bacterial and viral, including COVID-19) have occurred with momelotinib, including fatal cases.   Secondary malignancies: An increased risk of lymphoma and other malignancies (excluding nonmelanoma skin cancer) has been demonstrated with another JAK inhibitor when used for the treatment of rheumatoid arthritis (not an approved use for momelotinib). Smoking (current or past history) increased the risk for malignancy.   Thrombosis: The risk for thrombosis, including deep venous thrombosis, pulmonary embolism, and arterial thrombosis, was increased with another JAK inhibitor when used for the treatment of rheumatoid arthritis (not an approved use for momelotinib).    Pregnancy and Lactation   Patients who could become pregnant should use highly effective contraception during therapy and for at least 1 week after the last momelotinib dose.   Based on data from animal reproduction studies, in utero exposure to momelotinib may cause fetal harm. It is not known if momelotinib is present in breast milk.  Due to the potential for serious adverse reactions in the breastfed infant, breastfeeding is not recommended by the manufacturer during therapy and for at least 1 week after the last momelotinib dose.    Drug/Food Interactions     Medication list reviewed in Epic. The patient was instructed to inform the care team before taking any new medications or supplements. No drug interactions identified.   Vaccines (Inactivated/Non-Replicating): Immunosuppressants (Miscellaneous Oncologic Agents) may diminish the therapeutic effect of Vaccines (Inactivated/Non-Replicating). Management: Give inactivated vaccines at least 2 weeks prior to initiation of immunosuppressants when possible. Patients vaccinated less than 14 days before initiating or during therapy should be revaccinated at least 3 after therapy is complete. Risk D: Consider therapy modification  Vaccines (Live): Immunosuppressants (Miscellaneous Oncologic Agents) may enhance the adverse/toxic effect of Vaccines (Live). Specifically, the risk of vaccine-associated infection may be increased. Immunosuppressants (Miscellaneous Oncologic Agents) may diminish the therapeutic effect of Vaccines (Live). Risk X: Avoid combination    Storage, Handling Precautions, & Disposal   Store medication at room temperature  Dispense in the original bottle to protect from moisture. Do not discard dessicant. Do not eat or swallow dessicant  Keep all drugs out of the reach of children and pets  Throw away unused or expired medications.  Do not flush down a toilet or pour down the drain        Current Medications (including OTC/herbals), Comorbidities and Allergies     Current Outpatient Medications   Medication Sig Dispense Refill    acetaminophen (TYLENOL) 325 MG tablet Take 2 tablets (650 mg total) by mouth every four (4) hours as needed for fever (temp >38.5C).  0    atorvastatin (LIPITOR) 40 MG tablet Take 1 tablet (40 mg total) by mouth daily. 90 tablet 1    cetirizine (ZYRTEC) 10 MG tablet Take 1 tablet (10 mg total) by mouth daily.      citalopram (CELEXA) 10 MG tablet Take 3 tablets (30 mg total) by mouth Three (3) times a day before meals. (Patient taking differently: Take 1 tablet (10 mg total) by mouth Three (3) times a day before meals. 10 mg in AM, 20 mg in PM) 90 tablet 0    docusate sodium (COLACE) 100 MG capsule Take 50 mg by mouth daily.      Lactobacillus rhamnosus GG (CULTURELLE) 10 billion cell capsule Take 1 capsule by mouth daily.  meclizine (ANTIVERT) 12.5 mg tablet Take 1 tablet (12.5 mg total) by mouth Three (3) times a day as needed for dizziness or nausea. 30 tablet 1    momelotinib (OJJAARA) 200 mg tablet Take 1 tablet (200 mg total) by mouth daily. Swallow tablets whole; do not cut, crush, or chew. 30 tablet 2    multivitamin (TAB-A-VITE/THERAGRAN) per tablet Take 1 tablet by mouth daily.      naproxen sodium (ALEVE) 220 mg cap Take by mouth.      omega-3 fatty acids-fish oil 300-1,000 mg cap capsule Take 1 capsule by mouth in the morning. omeprazole (PRILOSEC) 20 MG capsule TAKE 1 CAPSULE(20 MG) BY MOUTH DAILY 90 capsule 2    raloxifene (EVISTA) 60 mg tablet Take 1 tablet (60 mg total) by mouth daily. 90 tablet 3    ruxolitinib (JAKAFI) 10 mg tablet Take 1 tablet (10 mg total) by mouth Two (2) times a day. 60 tablet 11    simethicone (MYLICON) 80 MG chewable tablet Chew 1 tablet (80 mg total) every six (6) hours as needed for flatulence.      vitamins B1 B6 B12 Liqd Take by mouth daily.       No current facility-administered medications for this visit.       No Known Allergies    Patient Active Problem List   Diagnosis    Sleep disorder    Abnormal Pap smear of cervix    Family history of colon cancer    Family history of thyroid cancer    Gastroesophageal reflux disease    History of breast cancer    Mixed hyperlipidemia    Postmenopausal osteoporosis    Pure hypercholesterolemia    Sjogrens syndrome (CMS-HCC)    Vitamin D deficiency    Depression, major, single episode, complete remission (CMS-HCC)    Essential thrombocythemia (CMS-HCC)    Acquired hypothyroidism    JAK-2 gene mutation    MPN (myeloproliferative neoplasm) (CMS-HCC)    Loss of consciousness (CMS-HCC)    NPH (normal pressure hydrocephalus) (CMS-HCC)    Atypical meningioma of brain (CMS-HCC)    Red blood cell antibody positive    Ventriculo-peritoneal shunt status       Reviewed and up to date in Epic.    Appropriateness of Therapy     Acute infections noted within Epic:  No active infections  Patient reported infection: None    Is medication and dose appropriate based on diagnosis and infection status? Yes    Prescription has been clinically reviewed: Yes      Baseline Quality of Life Assessment      How many days over the past month did your condition  keep you from your normal activities? For example, brushing your teeth or getting up in the morning. 0    Financial Information     Medication Assistance provided: Prior Authorization    Anticipated copay of $0 reviewed with patient. Verified delivery address.    Delivery Information     Scheduled delivery date: 05/21/22    Expected start date: 05/21/22    Patient was notified of new phone menu: Yes    Medication will be delivered via Same Day Courier to the prescription address in [facility].  This shipment will not require a signature.      Explained the services we provide at [facility] and that each month we would call to set up refills.  Stressed importance of returning phone calls so that we could ensure  they receive their medications in time each month.  Informed patient that we should be setting up refills 7-10 days prior to when they will run out of medication.  A pharmacist will reach out to perform a clinical assessment periodically.  Informed patient that a welcome packet, containing information about our pharmacy and other support services, a Notice of Privacy Practices, and a drug information handout will be sent.      The patient or caregiver noted above participated in the development of this care plan and knows that they can request review of or adjustments to the care plan at any time.      Patient or caregiver verbalized understanding of the above information as well as how to contact the pharmacy at [phone] option 4 with any questions/concerns.  The pharmacy is open Monday through Friday 8:30am-4:30pm.  A pharmacist is available 24/7 via pager to answer any clinical questions they may have.    Patient Specific Needs     Does the patient have any physical, cognitive, or cultural barriers? No    Does the patient have adequate living arrangements? (i.e. the ability to store and take their medication appropriately) Yes    Did you identify any home environmental safety or security hazards? No    Patient prefers to have medications discussed with  Patient     Is the patient or caregiver able to read and understand education materials at a high school level or above? Yes    Patient's primary language is English     Is the patient high risk? No    SOCIAL DETERMINANTS OF HEALTH     At the [facility], we have learned that life circumstances - like trouble affording food, housing, utilities, or transportation can affect the health of many of our patients.   That is why we wanted to ask: are you currently experiencing any life circumstances that are negatively impacting your health and/or quality of life? No    Social Determinants of Health     Financial Resource Strain: Medium Risk (08/20/2021)    Overall Financial Resource Strain (CARDIA)     Difficulty of Paying Living Expenses: Somewhat hard   Internet Connectivity: Not on file   Food Insecurity: No Food Insecurity (08/20/2021)    Hunger Vital Sign     Worried About Running Out of Food in the Last Year: Never true     Ran Out of Food in the Last Year: Never true   Tobacco Use: Medium Risk (04/27/2022)    Patient History     Smoking Tobacco Use: Former     Smokeless Tobacco Use: Never     Passive Exposure: Not on file   Housing/Utilities: Low Risk  (08/20/2021)    Housing/Utilities     Within the past 12 months, have you ever stayed: outside, in a car, in a tent, in an overnight shelter, or temporarily in someone else's home (i.e. couch-surfing)?: No     Are you worried about losing your housing?: No     Within the past 12 months, have you been unable to get utilities (heat, electricity) when it was really needed?: No   Alcohol Use: Unknown (05/03/2017)    Received from [facility]    AUDIT-C     Frequency of Alcohol Consumption: Monthly or less     Average Number of Drinks: Not on file     Frequency of Binge Drinking: Not on file   Transportation Needs: No Transportation Needs (  08/20/2021)    PRAPARE - Therapist, art (Medical): No     Lack of Transportation (Non-Medical): No   Substance Use: Not on file   Health Literacy: Not on file   Physical Activity: Not on file   Interpersonal Safety: Not on file   Stress: Not on file   Intimate Partner Violence: Not on file   Depression: Not on file   Social Connections: Not on file       Would you be willing to receive help with any of the needs that you have identified today? Not applicable       Yexalen Deike Vangie Bicker, PharmD  [facility] Specialty Pharmacist

## 2022-05-11 ENCOUNTER — Telehealth: Admit: 2022-05-11 | Discharge: 2022-05-12 | Payer: MEDICARE | Attending: Oncology | Primary: Oncology

## 2022-05-11 DIAGNOSIS — D473 Essential (hemorrhagic) thrombocythemia: Principal | ICD-10-CM

## 2022-05-11 NOTE — Unmapped (Signed)
Clinical Pharmacist Practitioner: [facility]       Patient Information: Christy Buchanan  is a 67 y.o. year-old female with  essential thrombocytosis   who I am seeing patient in the clinic for momelotinib counseling.    Current MPN therapy: none (was on ruxolitinib, but stopped due to anemia and increasing platelet count)    A/P:   Christy Buchanan will start momelotinib at 200 mg daily    Counseled patient on:  Therapeutic Rationale: essential thrombocythenia  Dose and administration:  take once daily with or without food  Storage and handling:  keep in original bottle with dessicant to protect from moisture  Keeps medications in a pillbox in the kitchen cabinet  Adverse Effects (MOMENTUM study):  Infections 13% (bacterial, viral), hepatitis B reactivation  GI effects: diarrhea (22%), nausea (16%), emesis (16%), emetogenic potential minimal/low (<30%)  Myelosuppression: thrombocytopenia 28%  Hepatoxicity (DILI in 2/993 study patients, transaminitis ~23%)  Bleeding (22%), dizziness (15%)  Pruritis (11%)  Rare neuropathy (8% reported in the Simplify study)  Drug-Drug Interactions:   Co-administration with strong inhibitors and inducers of CYP3A4 an lead to alterations in serum concentration.   Patient has been informed of possibility of potential drug interactions with new drugs and has been told to contact provider when starting new OTC medication or herbal supplements.   Patient's medication list has been reviewed and no interactions were found.   Monitoring:  cbc/d, liver and kidney function once a month for at least the first 6 months      Christy Buchanan verbalized understanding of the treatment plan provided and had no further questions.       F/u: 05/20/22 with Dr [name]  ____________________________________________________________________________________________________________    Interim history:  -intolerant of ruxolitinib 10 mg bid due to anemia, and increasing platelet count    Pharmacy: [facility]    Adherence:  has pillbox    Medication Access  Insurance: medicare  Copay Assistance: tbd    Labs:   No visits with results within 1 Day(s) from this visit.   Latest known visit with results is:   Office Visit on 03/31/2022   Component Date Value Ref Range Status    TSH 03/31/2022 2.209  0.550 - 4.780 uIU/mL Final       Approximate video face time spent with patient: 20 minutes       Audie Box, PharmD, BCOP, CPP  Clinical Pharmacist Practitioner, Benign Hematology     The patient was physically located in [place] or a state in which I am permitted to provide care. The patient understood that s/he may incur co-pays and cost sharing, and agreed to the telemedicine visit. The visit was completed via phone and/or video, which was appropriate and reasonable under the circumstances given the patient's presentation at the time.     The patient has been advised of the potential risks and limitations of this mode of treatment (including, but not limited to, the absence of in-person examination) and has agreed to be treated using telemedicine. The patient's/patient's family's questions regarding telemedicine have been answered.      If the phone/video visit was completed in an ambulatory setting, the patient has also been advised to contact their provider???s office for worsening conditions, and seek emergency medical treatment and/or call 911 if the patient deems either necessary.     Visit conducted by: piec video  Person contacted: patient  Contact phone number: epic video  Is there someone else in the room? no

## 2022-05-20 NOTE — Unmapped (Signed)
Hi,     Christy Buchanan contacted the Communication Center requesting to speak with the care team of Christy Buchanan to discuss:    -that she is returning your call concerning an appointment change.     Please contact [name]  at [phone].      [x]  Preferred Name   [x]  DOB and/or MR#  [x]  Preferred Contact Method  [x]  Phone Number(s)   []  MyChart     Thank you,   Durward Fortes  [facility]   [phone]

## 2022-05-26 ENCOUNTER — Ambulatory Visit: Admit: 2022-05-26 | Payer: MEDICARE
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
## 2022-05-27 ENCOUNTER — Ambulatory Visit
Admit: 2022-05-27 | Discharge: 2022-05-28 | Payer: MEDICARE | Attending: Clinical Neuropsychologist | Primary: Clinical Neuropsychologist

## 2022-05-29 ENCOUNTER — Ambulatory Visit
Admit: 2022-05-29 | Discharge: 2022-05-29 | Payer: MEDICARE | Attending: Hematology & Oncology | Primary: Hematology & Oncology

## 2022-05-29 ENCOUNTER — Other Ambulatory Visit: Admit: 2022-05-29 | Discharge: 2022-05-29 | Payer: MEDICARE

## 2022-05-29 DIAGNOSIS — D473 Essential (hemorrhagic) thrombocythemia: Principal | ICD-10-CM

## 2022-05-29 DIAGNOSIS — E119 Type 2 diabetes mellitus without complications: Principal | ICD-10-CM

## 2022-05-29 DIAGNOSIS — D471 Chronic myeloproliferative disease: Principal | ICD-10-CM

## 2022-05-29 LAB — CBC W/ AUTO DIFF
BASOPHILS ABSOLUTE COUNT: 0.1 10*9/L (ref 0.0–0.1)
BASOPHILS RELATIVE PERCENT: 1.4 %
EOSINOPHILS ABSOLUTE COUNT: 0.1 10*9/L (ref 0.0–0.5)
EOSINOPHILS RELATIVE PERCENT: 2.9 %
HEMATOCRIT: 28.4 % — ABNORMAL LOW (ref 34.0–44.0)
HEMOGLOBIN: 10.2 g/dL — ABNORMAL LOW (ref 11.3–14.9)
LYMPHOCYTES ABSOLUTE COUNT: 1.4 10*9/L (ref 1.1–3.6)
LYMPHOCYTES RELATIVE PERCENT: 30.2 %
MEAN CORPUSCULAR HEMOGLOBIN CONC: 35.8 g/dL (ref 32.0–36.0)
MEAN CORPUSCULAR HEMOGLOBIN: 32.4 pg (ref 25.9–32.4)
MEAN CORPUSCULAR VOLUME: 90.4 fL (ref 77.6–95.7)
MEAN PLATELET VOLUME: 7.5 fL (ref 6.8–10.7)
MONOCYTES ABSOLUTE COUNT: 0.4 10*9/L (ref 0.3–0.8)
MONOCYTES RELATIVE PERCENT: 9.7 %
NEUTROPHILS ABSOLUTE COUNT: 2.6 10*9/L (ref 1.8–7.8)
NEUTROPHILS RELATIVE PERCENT: 55.8 %
PLATELET COUNT: 555 10*9/L — ABNORMAL HIGH (ref 150–450)
RED BLOOD CELL COUNT: 3.14 10*12/L — ABNORMAL LOW (ref 3.95–5.13)
RED CELL DISTRIBUTION WIDTH: 16.2 % — ABNORMAL HIGH (ref 12.2–15.2)
WBC ADJUSTED: 4.6 10*9/L (ref 3.6–11.2)

## 2022-05-29 LAB — COMPREHENSIVE METABOLIC PANEL
ALBUMIN: 3.8 g/dL (ref 3.4–5.0)
ALKALINE PHOSPHATASE: 83 U/L (ref 46–116)
ALT (SGPT): 19 U/L (ref 10–49)
ANION GAP: 6 mmol/L (ref 5–14)
AST (SGOT): 19 U/L (ref <=34)
BILIRUBIN TOTAL: 0.4 mg/dL (ref 0.3–1.2)
BLOOD UREA NITROGEN: 16 mg/dL (ref 9–23)
BUN / CREAT RATIO: 17
CALCIUM: 9.6 mg/dL (ref 8.7–10.4)
CHLORIDE: 111 mmol/L — ABNORMAL HIGH (ref 98–107)
CO2: 25 mmol/L (ref 20.0–31.0)
CREATININE: 0.96 mg/dL
EGFR CKD-EPI (2021) FEMALE: 65 mL/min/{1.73_m2} (ref >=60)
GLUCOSE RANDOM: 93 mg/dL (ref 70–179)
POTASSIUM: 4 mmol/L (ref 3.4–4.8)
PROTEIN TOTAL: 7 g/dL (ref 5.7–8.2)
SODIUM: 142 mmol/L (ref 135–145)

## 2022-05-29 LAB — HEMOGLOBIN A1C
ESTIMATED AVERAGE GLUCOSE: 105 mg/dL
HEMOGLOBIN A1C: 5.3 % (ref 4.8–5.6)

## 2022-05-29 LAB — LACTATE DEHYDROGENASE: LACTATE DEHYDROGENASE: 323 U/L — ABNORMAL HIGH (ref 120–246)

## 2022-05-29 NOTE — Unmapped (Unsigned)
ID: Christy Buchanan is a 67 y.o. with a JAK2+ MPN     DZ CHAR: ET (JAK2+)  BM Bx (2018):  Increased atypical megakaryocytes with clustering  2% blasts  JAK2 mutation (VAF 3.5%)  Nl karyotype   Cellularity from 40 - 50%  Patchy mild increase in reticulin; no increase in collagen; no fibrosis  No monoclonal B or T cell populations  IPSET 2: High risk (age, JAK2)   Tx:   Hydrea (DC'd due to inc MPN Sx  Ruxolitnib (DC'd due to anemia)  Momelotinib     ASSESSMENT:   Christy Buchanan is a 67 y.o. 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  At the time of her diagnosis, she had no circulating immature or CD34+ cells, no increase in fibrosis, a normal LDH, and no palpable splenomegaly.      She was transitioned from hydroxyurea to ruxolitinib due to increased MPN related sx.  This change reduced her MPN symptom score initially from 48 to 21.  Since then, her score has continued rise likely because we have had to reduce her dose to accommodate a drop in her hemoglobin.  This dose reduction has also led to an increase in her platelets. Given this, we switched to momalotinib.       Over the course of the past year, it has become clear that her neurologic function has declined.  This decline is well documented by her neurologist.  She has been diagnosed with NPH, which has been treated, and small vessel vascular dementia.  The question is if her MPN is contributing to this process.  As a general rule, the MPNs contribute to thrombosis and large vessel disease.  It is less clear if it contributes to her current vasculopathy.  The current guidelines for the management of MPNs is to keep platelets < 600, which has by in large been the case.      We can reduce her platelets further by adding Hydrea.  However, I would like to see how she does on her momelotinib first.  I hope we will begin to see improvement in her MPN score though I realize there is a lot of overlap in these conditions.     We concentrated her modifiable risk factors.     PLAN: 1) Blood pressure control   I encouraged her to get a BP cuff and gave her instructions  She should target SBP in the 120's.   2) Lipid control:   Consider treatment to lower LDL < 100  3) Exercise:   Recommended aerobic exercise (with target HR of 100 to 120 bpm) with a goal of 150 min week  Strength and balance should be included   4) Aspirin: Twice a day if platelets > 450   5) I will repeat the JAK2 VAF at her next visit.  This will give [place] an idea if her disease is progressing.  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  04/2016: Began Hydrea at 500 mg every day; increased to 1000 mg  05/2016: BM biopsy   Increased atypical megakaryocytes with clustering  No fibrosis, 2% blasts  JAK2 mutation (VAF 3.5%)  Nl karyotype   Cellularity from 40 - 50%  Patchy mild increase in reticulin; no increase in collagen  No monoclonal B or T cell populations  03/15/19: Seen at [facility]  CBC: 7.3/12.0/312  LDH: WNL  CD34: 0.02  VWF: Normal   MPN score: 48 to 49  Began ruxolitinib 10 mg BID    04/05/19  CBC: 7.3/12.0/312  MPN score: 36 - 38  Ruxolitinib 10 mg BID; Hydrea was stopped     05/17/19: Ruxolitinib 20 mg BID  LDH: 1.30 x ULN  MPN Score: 27    05/31/19: Ruxolitinib 15 mg BID (decrease due to anemia of 10.6)    07/12/19: Ruxolitinib 10 mg BID (decrease due to anemia of 10.4, plts 477); added Hydrea 500 mg a day   CD34: 0.12  LDH: 1.42 x ULN  MPN Score: 32    10/25/19: Ruxolitinib 5 mg BID (continued anemia); Hydrea at 500 mg   LDH: 1.29 x ULN  MPN Score: 31    03/01/19:  Ruxolitinib 5 mg BID; Hydrea at 500 mg   LDH: 1.39 x ULN  MPN Score: 23    05/09/20:  Ruxolitinib 5mg  qAM and 10mg  qPM;  Stopped hydrea    06/12/20: Ruxolitinib 5mg  qAM and 10mg  qPM;  LDH 1.39 x ULN  MPN score: 34 but reports feeling better than last visit even with higher score.    08/29/20: 4.8/11.0/431  02/27/21: 6.2/11.4/477; Ruxolitinib 10 mg BID  MPN: 21  Xol: 159  LDH: 1.17 x ULN  09/09/21: 5.4/10.7/495  03/11/22: 8.8/11.9/659;   CD34: 13; B12 634; LDH 337;   HDL: 58; Non-HDL: 132  05/11/22: Momelotinib 200 mg BID  05/29/22: 4.6/10.2/555  HgbA1c: 5.3    INTERVAL HX:  Christy Buchanan comes for FU of her ET.  We discussed her issues surrounding her cognition.  We reviewed her history and the reports of her neurologists/PMR.      She reports tolerating her [place]      MPN 10 Score  Symptom (Absent 0 to 10 worst imaginable) 2/21 2/21 4/21 6/21 9/21  1/22  5/22 1/23 8/23 1/24 4/24   Fatigue in the past 24 hours 6 9 7 9 7  8 6  5 5 5 5    Filling up quickly when you eat (Early satiety)  7 7 5 5 5 5 4   0 5 5 2    Abdominal discomfort  3 4 4 4 4 5  4  2   0 After surgery 6 4 - 5  Gas - taking gas ZX   Inactivity 5 5 - 6 5 7 5 4  4  4 2  Walked yest - exercise 3 6 - outside in yard gardening - packing things up    Problems with concentration - 6 7-8 6 7 6 2  5  3 6 6  6-7  Innattention harder to stay focus - more fatigue    Numbness/ Tingling (in my hands and feet) 5 to 6 0 0 0 0 0  0  0 0 0 0   Night sweats 0 0 1 0 0 0  1 0 4 2-3 0   Itching (pruritus) 10 7 0 0 3 3   7 7 7 5 4    Bone pain (diffuse not joint pain or arthritis) 6 6 3  0 1 0  3  0 2 -3 0 Episodes - for min  Notr activ8ity mediated    Lower legs - not a cramp -   4-5   Fever (>100 F) 0 0 0 0 0 0  0  0 0 0 0   Unintentional weight loss last 6 months 0 0 0 0  0 0  0  0 1 - 2 0 0   TOTAL 48 - 49 36 - 38 27 32 31 27   34 21 32 - 34 32-34 31 - 34     PHYSICAL EXAM:  VS: As recorded in Epic  GENERAL: She appears well   HEENT: OP is clear  LYMPH NODES: No LAN  LUNGS: CTA  COR: RRR w/o m/r/g  ABD: NTND; no HSM  EXT: No edema  NEURO: VFFTC; CN 2 - 10 intact; RAM/FTN are intact; + Romberg; dec proprioception; increased tonicity in her ankle and foot.     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; PR pos, positive margin  L Mastectomy - no malignancy; neg LN  Txed with Surgery, radiation, auto BMT, raloxifene  Negative WU  for 28 genes associated with hereditary cancers  Raloxifene  Sjogren's syndrome  Autoimmune thyroiditis - Txed with methimazole   GERD - Txed with PPI  Mixed hyperlipidemia - Tx with Lovaza, pravastatin     FHx:  Daughter w/ spina bifida, BRCA 1081G>A mutation (w/o cancer)      2016 WHO Diagnostic Criteria for MPNs    ET  Major Criteria  Plts > 450  BM biopsy showing proliferation mainly of the megakaryocyte lineage with increased numbers of enlarged, mature megakaryocytes with hyperlobulated nuclei. No significant left-shift of neutrophil granulopoiesis or erythropoiesis and very rarely minor (grade 1) increase in reticulin fibers  Not meeting WHO criteria for BCR-ABL1?+?CML, PV, PMF, MDS, or other myeloid neoplasms)  Presence of JAK2, CALR or MPL mutation    Minor Criteria  Presence of a clonal marker (e.g., abnormal karyotype)  Absence of evidence for reactive thrombocytosis    Diagnosis: All major criteria or first three major and one minor     PMF  Major criteria  Proliferation and atypia of megakaryocytes accompanied by either reticulin and/or collagen fibrosis  grades 2 or 3 on a scale of 0 to 3  Not meeting WHO criteria for ET, PV, BCR-ABL1+ CML, myelodysplastic syndromes, or other myeloid neoplasm  Presence of JAK2, CALR or MPL mutation or in the absence of these mutations, presence of another clonal marker or absence of reactive myelofibrosis    Minor Criteria  Anemia not attributed to another comorbid condition  WBC > 11.0  Palpable Splenomegaly   LDH > ULN  Leukoerythroblastosis     Diagnosis: 3 major and at least 1 minor confirmed in two consecutive determinations    IPSET Model for Prediction of Thrombosis    Age:    2   < 60: 0   > 60: 2  WBC count (x10e9)  0   < 11: 0   > 11: 1  Hx of Thrombosis  0   No: 0   Yes: 1  --------------------------------------------  Total:    2     10 yr thrombosis risk, median survival (yrs)  Lo Risk: 0 pts    --> 89%, NR  Int Risk: 1-2 pts --> 86%, 24.5  Hi Risk: 3-4 pts  --> 69%, 14.7    IPSET II  Age:    1   < 60: 0   > 60: 1  CVS Risk Factor*  0   No: 0   Yes: 1   Hx of Thrombosis  0   No: 0   Yes: 2  JAK2     2   No: 0   Yes: 2  --------------------------------------------  Total:  3    *Gave up smoking 10 years ago, near optimal non-LDL on treatment, non HTN    Annual Thrombosis Risk  Lo Risk: 0-1 pts  --> 1.03%  Int Risk: 2 pts     --> 2.35%  Hi Risk: 3-6 pts  --> 3.56%     NOTE  JAK2 conveys a higher risk of thrombosis  CALR conveys a higher risk of bleeding    Newer Model:  [email or web address]    Primary prophylaxis of thrombosis  All patients: Aspirin unless    VWF activity <30%,   Platelet >?1 million,   CALR-mutated low-risk ET    PV with Hct >?45%: Add phlebotomy/cytoreduction to target Hct <?45%  Age >?60 years and/or prior history of thrombosis: Add cytoreduction  Secondary prophylaxis after thrombotic event  All patients: Cytoreduction  Typical VTE: Consider indefinite VKA for most patients; aspirin if not on VKA  Atypical VTE: Indefinite VKA  Arterial thrombosis: Aspirin Romberg; dec proprioception; increased tonicity in her ankle and foot.     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; PR pos, positive margin  L Mastectomy - no malignancy; neg LN  Txed with Surgery, radiation, auto BMT, raloxifene  Negative WU  for 28 genes associated with hereditary cancers  Raloxifene  Sjogren's syndrome  Autoimmune thyroiditis - Txed with methimazole   GERD - Txed with PPI  Mixed hyperlipidemia - Tx with Lovaza, pravastatin     FHx:  Daughter w/ spina bifida, BRCA 1081G>A mutation (w/o cancer)      2016 WHO Diagnostic Criteria for MPNs    ET  Major Criteria  Plts > 450  BM biopsy showing proliferation mainly of the megakaryocyte lineage with increased numbers of enlarged, mature megakaryocytes with hyperlobulated nuclei. No significant left-shift of neutrophil granulopoiesis or erythropoiesis and very rarely minor (grade 1) increase in reticulin fibers  Not meeting WHO criteria for BCR-ABL1?+?CML, PV, PMF, MDS, or other myeloid neoplasms)  Presence of JAK2, CALR or MPL mutation    Minor Criteria  Presence of a clonal marker (e.g., abnormal karyotype)  Absence of evidence for reactive thrombocytosis    Diagnosis: All major criteria or first three major and one minor     PMF  Major criteria  Proliferation and atypia of megakaryocytes accompanied by either reticulin and/or collagen fibrosis  grades 2 or 3 on a scale of 0 to 3  Not meeting WHO criteria for ET, PV, BCR-ABL1+ CML, myelodysplastic syndromes, or other myeloid neoplasm  Presence of JAK2, CALR or MPL mutation or in the absence of these mutations, presence of another clonal marker or absence of reactive myelofibrosis    Minor Criteria  Anemia not attributed to another comorbid condition  WBC > 11.0  Palpable Splenomegaly   LDH > ULN  Leukoerythroblastosis     Diagnosis: 3 major and at least 1 minor confirmed in two consecutive determinations    IPSET Model for Prediction of Thrombosis    Age:    2   < 60: 0   > 60: 2  WBC count (x10e9)  0   < 11: 0   > 11: 1  Hx of Thrombosis  0   No: 0   Yes: 1  --------------------------------------------  Total:    2     10 yr thrombosis risk, median survival (yrs)  Lo Risk: 0 pts    --> 89%, NR  Int Risk: 1-2 pts --> 86%, 24.5  Hi Risk: 3-4 pts  --> 69%, 14.7  IPSET II  Age:    1   < 60: 0   > 60: 1  CVS Risk Factor*  0   No: 0   Yes: 1   Hx of Thrombosis  0   No: 0   Yes: 2  JAK2     2   No: 0   Yes: 2  --------------------------------------------  Total:    3    *Gave up smoking 10 years ago, near optimal non-LDL on treatment, non HTN    Annual Thrombosis Risk  Lo Risk: 0-1 pts  --> 1.03%  Int Risk: 2 pts     --> 2.35%  Hi Risk: 3-6 pts  --> 3.56%     NOTE  JAK2 conveys a higher risk of thrombosis  CALR conveys a higher risk of bleeding    Newer Model:  [email or web address]    Primary prophylaxis of thrombosis  All patients: Aspirin unless    VWF activity <30%,   Platelet >?1 million,   CALR-mutated low-risk ET    PV with Hct >?45%: Add phlebotomy/cytoreduction to target Hct <?45%  Age >?60 years and/or prior history of thrombosis: Add cytoreduction  Secondary prophylaxis after thrombotic event  All patients: Cytoreduction  Typical VTE: Consider indefinite VKA for most patients; aspirin if not on VKA  Atypical VTE: Indefinite VKA  Arterial thrombosis: Aspirin

## 2022-05-29 NOTE — Unmapped (Addendum)
Vascular Disease  1) Blood pressure control    - You should get a BP cuff. Take it at different times; both feet need to be on floor, relaxed, and the cuff should be at heart level.    - Target 120's  2) Lipid control:    - LDL < 100    - Non HDL cholesterol may be better.   3) Exercise:   - Heart rate up (100 to 120); the goal is 150 min  - Strength and balance should be included   4) Aspirin: Twice a day for platelets > 450   5) Use your watch   6) I will repeat the JAK2 VAF - The amount of JAK2.  Three years ago about 7% of your cells were mutated.      Momelotinib is a JAK2 inhibitor that does not block hemoglobin synthesis. You may have anemia that is disease related.    The main reason to use these drug is to decrease the symptoms of the MPN.     In defence of interferon: The newer formulations are much better tolerated. It takes really low doses to control platelets. It does increase autoimmune conditions.  Thyroid conditions are easy to follow and easy to treat. The problem is loss of thyroid function. It does not have effects on lipids like JAK2 inhibitors can.     All lab results last 24 hours:    Recent Results (from the past 24 hour(s))   CBC w/ Differential    Collection Time: 05/29/22 10:41 AM   Result Value Ref Range    WBC 4.6 3.6 - 11.2 10*9/L    RBC 3.14 (L) 3.95 - 5.13 10*12/L    HGB 10.2 (L) 11.3 - 14.9 g/dL It is too soon to make too much of this number.     HCT 28.4 (L) 34.0 - 44.0 %    MCV 90.4 77.6 - 95.7 fL    MCH 32.4 25.9 - 32.4 pg    MCHC 35.8 32.0 - 36.0 g/dL    RDW 65.7 (H) 84.6 - 15.2 %    MPV 7.5 6.8 - 10.7 fL    Platelet 555 (H) 150 - 450 10*9/L Would you benefit from < 450? We can try this be adding Hydrea     Neutrophils % 55.8 %    Lymphocytes % 30.2 %    Monocytes % 9.7 %    Eosinophils % 2.9 %    Basophils % 1.4 %    Absolute Neutrophils 2.6 1.8 - 7.8 10*9/L    Absolute Lymphocytes 1.4 1.1 - 3.6 10*9/L    Absolute Monocytes 0.4 0.3 - 0.8 10*9/L    Absolute Eosinophils 0.1 0.0 - 0.5 10*9/L    Absolute Basophils 0.1 0.0 - 0.1 10*9/L    Anisocytosis Slight (A) Not Present
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
## 2022-05-31 NOTE — Unmapped (Signed)
Candise Bowens and She-Li,     I told Ms Gassen that she could come back in 3 months.  However, we should get labs in 6 weeks (I.e. last week in May).  I am trying to lower her platelets to < 600.      I put in orders for the labs, but I did not tell her.      Thanks, H

## 2022-06-05 MED ORDER — ATORVASTATIN 80 MG TABLET
ORAL_TABLET | Freq: Every day | ORAL | 1 refills | 90 days | Status: CP
Start: 2022-06-05 — End: 2023-06-05

## 2022-06-15 ENCOUNTER — Telehealth: Admit: 2022-06-15 | Discharge: 2022-06-16 | Payer: MEDICARE

## 2022-06-15 DIAGNOSIS — G3184 Mild cognitive impairment, so stated: Principal | ICD-10-CM

## 2022-06-15 DIAGNOSIS — M3500 Sicca syndrome, unspecified: Principal | ICD-10-CM

## 2022-06-15 DIAGNOSIS — F325 Major depressive disorder, single episode, in full remission: Principal | ICD-10-CM

## 2022-06-15 DIAGNOSIS — G912 (Idiopathic) normal pressure hydrocephalus: Principal | ICD-10-CM

## 2022-06-15 DIAGNOSIS — E782 Mixed hyperlipidemia: Principal | ICD-10-CM

## 2022-06-15 NOTE — Unmapped (Signed)
[facility] Clinical Assessment & Refill Coordination Note    Christy Buchanan, DOB: 1955-06-19  Phone: [phone] (home)     All above HIPAA information was verified with patient.     Was a Nurse, learning disability used for this call? No    Specialty Medication(s):   Hematology/Oncology: Ojjaara     Current Outpatient Medications   Medication Sig Dispense Refill    acetaminophen (TYLENOL) 325 MG tablet Take 2 tablets (650 mg total) by mouth every four (4) hours as needed for fever (temp >38.5C).  0    atorvastatin (LIPITOR) 80 MG tablet Take 1 tablet (80 mg total) by mouth daily. 90 tablet 1    cetirizine (ZYRTEC) 10 MG tablet Take 1 tablet (10 mg total) by mouth daily as needed for allergies.      citalopram (CELEXA) 10 MG tablet Take 3 tablets (30 mg total) by mouth Three (3) times a day before meals. (Patient taking differently: Take 1 tablet (10 mg total) by mouth Three (3) times a day before meals. 10 mg in AM, 20 mg in PM) 90 tablet 0    docusate sodium (COLACE) 100 MG capsule Take 50 mg by mouth daily.      Lactobacillus rhamnosus GG (CULTURELLE) 10 billion cell capsule Take 1 capsule by mouth daily.      meclizine (ANTIVERT) 12.5 mg tablet Take 1 tablet (12.5 mg total) by mouth Three (3) times a day as needed for dizziness or nausea. 30 tablet 1    momelotinib (OJJAARA) 200 mg tablet Take 1 tablet (200 mg total) by mouth daily. Swallow tablets whole; do not cut, crush, or chew. 30 tablet 2    multivitamin (TAB-A-VITE/THERAGRAN) per tablet Take 1 tablet by mouth daily.      naproxen sodium (ALEVE) 220 mg cap Take 1 tablet by mouth daily as needed. Headache, needed 2-3x last month      omega-3 fatty acids-fish oil 300-1,000 mg cap capsule Take 1 capsule by mouth in the morning.      omeprazole (PRILOSEC) 20 MG capsule TAKE 1 CAPSULE(20 MG) BY MOUTH DAILY 90 capsule 2    raloxifene (EVISTA) 60 mg tablet Take 1 tablet (60 mg total) by mouth daily. 90 tablet 3    simethicone (MYLICON) 80 MG chewable tablet Chew 1 tablet (80 mg total) every six (6) hours as needed for flatulence.      vitamins B1 B6 B12 Liqd Take by mouth every other day.       No current facility-administered medications for this visit.        Changes to medications:  increase in Lipitor dose from 40mg  to 80mg     No Known Allergies    Changes to allergies: No    SPECIALTY MEDICATION ADHERENCE     Ojjaara 200 mg: 5 days of medicine on hand       Medication Adherence    Patient reported X missed doses in the last month: 0  Specialty Medication: Ojjaara 200mg   Patient is on additional specialty medications: No  Informant: patient          Specialty medication(s) dose(s) confirmed: Regimen is correct and unchanged.     Are there any concerns with adherence? No    Adherence counseling provided? Not needed    CLINICAL MANAGEMENT AND INTERVENTION      Clinical Benefit Assessment:    Do you feel the medicine is effective or helping your condition?  Too soon for lab difference  Clinical Benefit counseling provided? Not needed    Adverse Effects Assessment:    Are you experiencing any side effects? Yes, patient reports experiencing fatigue. Side effect counseling provided: rest and light exercise    Are you experiencing difficulty administering your medicine? No    Quality of Life Assessment:    Quality of Life    Rheumatology  Oncology  Dermatology  Cystic Fibrosis          How many days over the past month did your condition  keep you from your normal activities? For example, brushing your teeth or getting up in the morning. 0    Have you discussed this with your provider? Not needed    Acute Infection Status:    Acute infections noted within Epic:  No active infections  Patient reported infection: None    Therapy Appropriateness:    Is therapy appropriate and patient progressing towards therapeutic goals? Yes, therapy is appropriate and should be continued    DISEASE/MEDICATION-SPECIFIC INFORMATION      N/A    Oncology: Is the patient receiving adequate infection prevention treatment? Not applicable  Does the patient have adequate nutritional support? Not applicable    PATIENT SPECIFIC NEEDS     Does the patient have any physical, cognitive, or cultural barriers? No    Is the patient high risk? No    Did the patient require a clinical intervention? No    Does the patient require physician intervention or other additional services (i.e., nutrition, smoking cessation, social work)? No    SOCIAL DETERMINANTS OF HEALTH     At the [facility], we have learned that life circumstances - like trouble affording food, housing, utilities, or transportation can affect the health of many of our patients.   That is why we wanted to ask: are you currently experiencing any life circumstances that are negatively impacting your health and/or quality of life? No    Social Determinants of Health     Financial Resource Strain: Low Risk  (06/02/2022)    Overall Financial Resource Strain (CARDIA)     Difficulty of Paying Living Expenses: Not very hard   Internet Connectivity: Not on file   Food Insecurity: Food Insecurity Present (06/02/2022)    Hunger Vital Sign     Worried About Running Out of Food in the Last Year: Sometimes true     Ran Out of Food in the Last Year: Sometimes true   Tobacco Use: Medium Risk (05/27/2022)    Patient History     Smoking Tobacco Use: Former     Smokeless Tobacco Use: Never     Passive Exposure: Not on file   Housing/Utilities: Low Risk  (06/02/2022)    Housing/Utilities     Within the past 12 months, have you ever stayed: outside, in a car, in a tent, in an overnight shelter, or temporarily in someone else's home (i.e. couch-surfing)?: No     Are you worried about losing your housing?: No     Within the past 12 months, have you been unable to get utilities (heat, electricity) when it was really needed?: No   Alcohol Use: Unknown (05/03/2017)    Received from [facility]    AUDIT-C     Frequency of Alcohol Consumption: Monthly or less     Average Number of Drinks: Not on file     Frequency of Binge Drinking: Not on file   Transportation Needs: Unmet Transportation Needs (06/02/2022)    PRAPARE -  Therapist, art (Medical): Yes     Lack of Transportation (Non-Medical): Patient declined   Substance Use: Not on file   Health Literacy: Not on file   Physical Activity: Not on file   Interpersonal Safety: Not on file   Stress: Not on file   Intimate Partner Violence: Not on file   Depression: Not at risk (05/29/2022)    PHQ-2     PHQ-2 Score: 0   Social Connections: Not on file       Would you be willing to receive help with any of the needs that you have identified today? Not applicable       SHIPPING     Specialty Medication(s) to be Shipped:   Hematology/Oncology: Ojjaara    Other medication(s) to be shipped: No additional medications requested for fill at this time     Changes to insurance: No    Delivery Scheduled: Yes, Expected medication delivery date: 06/18/22.     Medication will be delivered via Next Day Courier to the confirmed prescription address in [facility].    The patient will receive a drug information handout for each medication shipped and additional FDA Medication Guides as required.  Verified that patient has previously received a Conservation officer, historic buildings and a Surveyor, mining.    The patient or caregiver noted above participated in the development of this care plan and knows that they can request review of or adjustments to the care plan at any time.      All of the patient's questions and concerns have been addressed.    Daisa Stennis Vangie Bicker, PharmD   [facility] Specialty Pharmacist

## 2022-06-16 NOTE — Unmapped (Signed)
Stable based upon today's assessment.  Continue current treatment plan and follow up at least yearly.

## 2022-06-16 NOTE — Unmapped (Signed)
Condition is ongoing  Medication and treatment plan as described in orders or med list.  Counseled on etiology, treatment, warning signs.  Provided information regarding diagnoses and treatment plans  Continue with care per Neurology

## 2022-06-16 NOTE — Unmapped (Signed)
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  Counseled on etiology, treatment, warning signs.  Provided information regarding diagnoses and treatment plans  Continue with care per Neurology           NPH (normal pressure hydrocephalus) (CMS-HCC) - Primary     Condition is stable  Medication and treatment plan as described in orders or med list.  Counseled on etiology, treatment, warning signs.  Provided information regarding diagnoses and treatment plans                Other    Mixed hyperlipidemia (Chronic)     Condition is ongoing  Medication and treatment plan as described in orders or med list.  Counseled on etiology, treatment, warning signs.  Provided information regarding diagnoses and treatment plans           Sjogrens syndrome (CMS-HCC) (Chronic)     Stable based upon today's assessment.  Continue current treatment plan and follow up at least yearly.              Depression, major, single episode, complete remission (CMS-HCC) (Chronic)     Stable based upon today's assessment.  Continue current treatment plan and follow up at least yearly.                 No follow-ups on file.      Visit Format/Coding: Video Call: (Audio and Video): All insurers/Charity care/self-pay:  use traditional E/M codes for [employer].      This visit is conducted via telephone or video conferencing.    The patient reports they are physically located in [place] and is currently: at home. I conducted a audio/video visit. I spent  40m 40s on the video call with the patient. I spent an additional 15 minutes on pre- and post-visit activities on the date of service .              SUBJECTIVE    Subjective     This is a 67 y.o. female who presents for a telehealth visit today with had concerns including Follow-up (Update him on current health issues).     Subjective   This is a 67 y.o. female who presents with had concerns including Follow-up (Update him on current health issues).     Dementia  She is here for follow up of cognitive problems. Primary caregiver is patient and daughter. The family and the patient identify problems with changes in long term memory and getting disoriented outside of familiar environment. She has been seen in Memory Disorders Unit and had subsequent neuropsychiatric testing. Medication administration: patient self medicates . She is applying for transportation assistance in [place].  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    Placed 11.2023  [place]  150 is setting 11.2023    Vitamin D deficiency      Past Surgical History:   Procedure Laterality Date    AUGMENTATION MAMMAPLASTY Bilateral     24ish yrs    BREAST BIOPSY Left     malignant    CHEMOTHERAPY      MASTECTOMY Left 25 ish yrs ago    PR CREATE SHUNT:VENTRIC-PERITONEAL Right 12/30/2021    Procedure: CREAT SHUNT; VENTRICULO-PERITONEAL/PLEURAL;  Surgeon: Francisca December, MD;  Location: MAIN OR [place];  Service: Neurosurgery    PR LAP, SURG ENTEROLYSIS Right 12/30/2021    Procedure: LAPAROSCOPY, SURGICAL, ENTEROLYSIS (FREEING OF INTESTINAL ADHESION) (SEPARATE PROCEDURE);  Surgeon: Katherina Mires, MD;  Location: MAIN OR [facility];  Service: Trauma    RADIATION Left      (Not in a hospital admission)        REVIEW OF SYSTEMS    Past medical history, medications, social history, and allergies reviewed and updated.  Remainder of 10 of 13 systems review negative             Objective     OBJECTIVE  No Physical Exam was completed due to inability for visual confirmation        Labs, Imaging, and Other Clinical Data:  I have reviewed the labs, imaging studies, and other clinical data associated with this encounter.  See Epic Labs and Imaging section for details.                        [facility]  [place] [facility]  [address] [place], [place] [address]  Telephone [phone]  Fax [phone]  [email or web address]

## 2022-06-16 NOTE — Unmapped (Signed)
Condition is ongoing  Medication and treatment plan as described in orders or med list.  Counseled on etiology, treatment, warning signs.  Provided information regarding diagnoses and treatment plans

## 2022-06-16 NOTE — Unmapped (Signed)
Condition is stable  Medication and treatment plan as described in orders or med list.  Counseled on etiology, treatment, warning signs.  Provided information regarding diagnoses and treatment plans

## 2022-06-17 MED FILL — OJJAARA 200 MG TABLET: ORAL | 30 days supply | Qty: 30 | Fill #1

## 2022-06-23 MED ORDER — ATORVASTATIN 80 MG TABLET
ORAL_TABLET | Freq: Every day | ORAL | 2 refills | 90 days | Status: CP
Start: 2022-06-23 — End: 2023-06-23

## 2022-07-07 DIAGNOSIS — D473 Essential (hemorrhagic) thrombocythemia: Principal | ICD-10-CM

## 2022-07-08 NOTE — Unmapped (Signed)
[facility] Refill Coordination Note    Specialty Medication(s) to be Shipped:   Hematology/Oncology: Ojjaara 200mg     Other medication(s) to be shipped: No additional medications requested for fill at this time     Christy Buchanan, DOB: 1955/03/21  Phone: [phone] (home)       All above HIPAA information was verified with patient.     Was a Nurse, learning disability used for this call? No    Completed refill call assessment today to schedule patient's medication shipment from the [facility] [phone]).  All relevant notes have been reviewed.     Specialty medication(s) and dose(s) confirmed: Regimen is correct and unchanged.   Changes to medications: Rukia reports no changes at this time.  Changes to insurance: No  New side effects reported not previously addressed with a pharmacist or physician: None reported  Questions for the pharmacist: No    Confirmed patient received a Conservation officer, historic buildings and a Surveyor, mining with first shipment. The patient will receive a drug information handout for each medication shipped and additional FDA Medication Guides as required.       DISEASE/MEDICATION-SPECIFIC INFORMATION        N/A    SPECIALTY MEDICATION ADHERENCE     Medication Adherence    Patient reported X missed doses in the last month: 0  Specialty Medication: Ojjaara 200mg   Patient is on additional specialty medications: No  Informant: patient              Were doses missed due to medication being on hold? No    Ojjaara 200 mg: 14 days of medicine on hand       REFERRAL TO PHARMACIST     Referral to the pharmacist: Not needed      [facility]     Shipping address confirmed in Epic.       Delivery Scheduled: Yes, Expected medication delivery date: 07/16/22.     Medication will be delivered via Next Day Courier to the prescription address in Epic WAM.    [name]   [facility] Specialty Technician

## 2022-07-11 LAB — CBC W/ DIFFERENTIAL
BANDED NEUTROPHILS ABSOLUTE COUNT: 0 10*3/uL (ref 0.0–0.1)
BASOPHILS ABSOLUTE COUNT: 0.1 10*3/uL (ref 0.0–0.2)
BASOPHILS RELATIVE PERCENT: 2 %
EOSINOPHILS ABSOLUTE COUNT: 0.1 10*3/uL (ref 0.0–0.4)
EOSINOPHILS RELATIVE PERCENT: 2 %
HEMATOCRIT: 32 % — ABNORMAL LOW (ref 34.0–46.6)
HEMOGLOBIN: 10.6 g/dL — ABNORMAL LOW (ref 11.1–15.9)
IMMATURE GRANULOCYTES: 0 %
LYMPHOCYTES ABSOLUTE COUNT: 1.7 10*3/uL (ref 0.7–3.1)
LYMPHOCYTES RELATIVE PERCENT: 36 %
MEAN CORPUSCULAR HEMOGLOBIN CONC: 33.1 g/dL (ref 31.5–35.7)
MEAN CORPUSCULAR HEMOGLOBIN: 30.3 pg (ref 26.6–33.0)
MEAN CORPUSCULAR VOLUME: 91 fL (ref 79–97)
MONOCYTES ABSOLUTE COUNT: 0.4 10*3/uL (ref 0.1–0.9)
MONOCYTES RELATIVE PERCENT: 9 %
NEUTROPHILS ABSOLUTE COUNT: 2.5 10*3/uL (ref 1.4–7.0)
NEUTROPHILS RELATIVE PERCENT: 51 %
PLATELET COUNT: 453 10*3/uL — ABNORMAL HIGH (ref 150–450)
RED BLOOD CELL COUNT: 3.5 x10E6/uL — ABNORMAL LOW (ref 3.77–5.28)
RED CELL DISTRIBUTION WIDTH: 15.5 % — ABNORMAL HIGH (ref 11.7–15.4)
WHITE BLOOD CELL COUNT: 4.8 10*3/uL (ref 3.4–10.8)

## 2022-07-11 LAB — COMPREHENSIVE METABOLIC PANEL
A/G RATIO: 1.7 (ref 1.2–2.2)
ALBUMIN: 4.3 g/dL (ref 3.9–4.9)
ALKALINE PHOSPHATASE: 94 IU/L (ref 44–121)
ALT (SGPT): 18 IU/L (ref 0–32)
AST (SGOT): 19 IU/L (ref 0–40)
BILIRUBIN TOTAL (MG/DL) IN SER/PLAS: 0.4 mg/dL (ref 0.0–1.2)
BLOOD UREA NITROGEN: 15 mg/dL (ref 8–27)
BUN / CREAT RATIO: 14 (ref 12–28)
CALCIUM: 9.6 mg/dL (ref 8.7–10.3)
CHLORIDE: 102 mmol/L (ref 96–106)
CO2: 21 mmol/L (ref 20–29)
CREATININE: 1.07 mg/dL — ABNORMAL HIGH (ref 0.57–1.00)
EGFR: 57 mL/min/{1.73_m2} — ABNORMAL LOW
GLOBULIN, TOTAL: 2.5 g/dL (ref 1.5–4.5)
GLUCOSE: 91 mg/dL (ref 70–99)
POTASSIUM: 4 mmol/L (ref 3.5–5.2)
SODIUM: 138 mmol/L (ref 134–144)
TOTAL PROTEIN: 6.8 g/dL (ref 6.0–8.5)

## 2022-07-11 LAB — LACTATE DEHYDROGENASE: LACTATE DEHYDROGENASE: 322 IU/L — ABNORMAL HIGH (ref 119–226)

## 2022-07-13 ENCOUNTER — Ambulatory Visit: Admit: 2022-07-13 | Discharge: 2022-07-14 | Payer: MEDICARE

## 2022-07-13 NOTE — Unmapped (Signed)
[facility]   [address]  [place], [place] [address]  COGNITIVE-BEHAVIORAL NEUROLOGY      Date: July 13, 2022   Patient Name: Christy Buchanan  MRN: [id number]      Assessment/Plan:   Recent neuropsychometric assessment is consistent with a predominantly subcortical pattern of cognitive impairment, not consistent with Alzheimer's. 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 Happily, platelet level has recently reduced on Ojjara and patient is also now on 2x81 mg ASA daily. Hopefully, these interventions will minimize future further progression.  We discussed implications for future progression, best practices for brain health (including daily exercise) and the need to have a more definitive assessment of her driving as that was the original reason she was referred and she did not complete her OT-based driving evaluation.    I have given her contact information to schedule an on-road driving assessment with a Clinical biochemist.   Return if symptoms worsen or fail to improve.     Subjective   I had the pleasure of seeing Christy Buchanan in neurologic follow-up in the company of dtr. Aundra Millet on July 13, 2022. The accompanying individual was essential to providing a complete HPI. They were previously seen by me on 04/27/2022 for a diagnosis of Cognitive decline, most likely secondary to advanced small vessel disease  Concerned about her driving safety; otherwise no evidence for functional impairment. Limited office-based assessment indicates preserved multi-tasking and visual-spatial ability. MRI reveals advanced small vessel disease for age. 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 She is (only) on 81 mg ASA. I am concerned that if she doesn't have a stronger anti-platelet agent, this process will progress rapidly. I have asked her to discuss this issue with her hematologist.   - AMB REFERRAL TO OCCUPATIONAL THERAPY for driving evaluation  - Neuropsych testing to clarify primarily cortical vs. subcortical pattern. If the former, I would consider sending for an amyloid PET scan to evaluate for co-morbid Alzheimer's disease. .    Interval History:  Per Dr. Tiburcio Pea' neuropsych assessment on 05/17/22: On testing, she exhibited impaired executive functioning, variable language performance, and difficulty with initial learning and encoding of information. All other abilities tested including orientation, delayed recall and recognition, processing speed, attention, and visuospatial functioning, were all measuring within expectation for her age and education. Her ADL/IADL functioning remains primarily intact, except for a temporary pause in driving as she awaits an on-road assessment to determine whether it is safe for her to continue driving. 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 Her cognitive performance does not suggest Alzheimer's Disease; therefore, a lumbar puncture to assess biomarkers related to Alzheimer's and other neurodegenerative conditions is necessarily indicated, although I will defer to Dr. Hope Budds on any other testing workup and treatment consideration.    Started a medication for myelofibrosis 2 months ago [place])  Has been driving only around town   Did not like the driving simulator. Got nauseated. Had to stop and couldn't complete the testing.  Complains of neuropathy in fingers and feet. Mild numbness and zaps.  Tried PT but expensive and  far away.    Medical history, surgical history, social history, family history, allergies and medications have been reviewed and updated in chart encounter.       Objective   Vitals:    07/13/22 1246   BP: 115/61   Pulse: 64   Resp: 18   Temp: 36.7 ??C (98.1 ??F)   SpO2: 99%     Vitals:    07/13/22 1246   Weight: 58.3 kg (128 lb 7 oz)       Laboratory testing:  TSH   Date Value Ref Range Status   03/31/2022 2.209 0.550 - 4.780 uIU/mL Final   05/07/2020 5.280 (H) 0.450 - 4.500 uIU/mL Final     Vitamin B-12   Date Value Ref Range Status   03/11/2022 675 211 - 911 pg/ml Final     Ferritin   Date Value Ref Range Status   06/12/2020 34.6 7.3 - 270.7 ng/mL Final     Orders Only on 07/07/2022   Component Date Value Ref Range Status    WBC 07/10/2022 4.8  3.4 - 10.8 x10E3/uL Final    RBC 07/10/2022 3.50 (L)  3.77 - 5.28 x10E6/uL Final    HGB 07/10/2022 10.6 (L)  11.1 - 15.9 g/dL Final    HCT 16/11/9602 32.0 (L)  34.0 - 46.6 % Final    MCV 07/10/2022 91  79 - 97 fL Final    MCH 07/10/2022 30.3  26.6 - 33.0 pg Final    MCHC 07/10/2022 33.1  31.5 - 35.7 g/dL Final    RDW 54/10/8117 15.5 (H)  11.7 - 15.4 % Final    Platelet 07/10/2022 453 (H)  150 - 450 x10E3/uL Final    Neutrophils % 07/10/2022 51  Not Estab. % Final    Lymphocytes % 07/10/2022 36  Not Estab. % Final    Monocytes % 07/10/2022 9  Not Estab. % Final    Eosinophils % 07/10/2022 2  Not Estab. % Final    Basophils % 07/10/2022 2  Not Estab. % Final    Absolute Neutrophils 07/10/2022 2.5  1.4 - 7.0 x10E3/uL Final    Absolute Lymphocytes 07/10/2022 1.7  0.7 - 3.1 x10E3/uL Final    Absolute Monocytes  07/10/2022 0.4  0.1 - 0.9 x10E3/uL Final    Absolute Eosinophils 07/10/2022 0.1  0.0 - 0.4 x10E3/uL Final    Absolute Basophils  07/10/2022 0.1  0.0 - 0.2 x10E3/uL Final    Immature Granulocytes 07/10/2022 0  Not Estab. % Final    Bands Absolute 07/10/2022 0.0  0.0 - 0.1 x10E3/uL Final    Glucose 07/10/2022 91  70 - 99 mg/dL Final    BUN 14/78/2956 15  8 - 27 mg/dL Final    Creatinine 21/30/8657 1.07 (H)  0.57 - 1.00 mg/dL Final    eGFR 84/69/6295 57 (L)  >59 mL/min/1.73 Final    BUN/Creatinine Ratio 07/10/2022 14  12 - 28 Final    Sodium 07/10/2022 138  134 - 144 mmol/L Final    Potassium 07/10/2022 4.0  3.5 - 5.2 mmol/L Final    Chloride 07/10/2022 102  96 - 106 mmol/L Final    CO2 07/10/2022 21  20 - 29 mmol/L Final    Calcium 07/10/2022 9.6  8.7 - 10.3 mg/dL Final    Total Protein 07/10/2022 6.8  6.0 - 8.5 g/dL Final    Albumin 28/41/3244 4.3  3.9 - 4.9 g/dL Final    Globulin, Total 07/10/2022 2.5  1.5 - 4.5 g/dL Final  A/G Ratio 07/10/2022 1.7  1.2 - 2.2 Final    Total Bilirubin 07/10/2022 0.4  0.0 - 1.2 mg/dL Final    Alkaline Phosphatase 07/10/2022 94  44 - 121 IU/L Final    AST 07/10/2022 19  0 - 40 IU/L Final    ALT 07/10/2022 18  0 - 32 IU/L Final    LDH 07/10/2022 322 (H)  119 - 226 IU/L Final   Office Visit on 05/29/2022   Component Date Value Ref Range Status    Hemoglobin A1C 05/29/2022 5.3  4.8 - 5.6 % Final    Estimated Average Glucose 05/29/2022 105  mg/dL Final   Lab on 16/11/9602   Component Date Value Ref Range Status    Sodium 05/29/2022 142  135 - 145 mmol/L Final    Potassium 05/29/2022 4.0  3.4 - 4.8 mmol/L Final    Chloride 05/29/2022 111 (H)  98 - 107 mmol/L Final    CO2 05/29/2022 25.0  20.0 - 31.0 mmol/L Final    Anion Gap 05/29/2022 6  5 - 14 mmol/L Final    BUN 05/29/2022 16  9 - 23 mg/dL Final    Creatinine 54/10/8117 0.96  0.55 - 1.02 mg/dL Final    BUN/Creatinine Ratio 05/29/2022 17   Final    eGFR CKD-EPI (2021) Female 05/29/2022 65  >=60 mL/min/1.24m2 Final    eGFR calculated with CKD-EPI 2021 equation in accordance with [employer] and [employer] of Nephrology Task Force recommendations.    Glucose 05/29/2022 93  70 - 179 mg/dL Final    Calcium 14/78/2956 9.6  8.7 - 10.4 mg/dL Final    Albumin 21/30/8657 3.8  3.4 - 5.0 g/dL Final    Total Protein 05/29/2022 7.0  5.7 - 8.2 g/dL Final    Total Bilirubin 05/29/2022 0.4  0.3 - 1.2 mg/dL Final    AST 84/69/6295 19  <=34 U/L Final    ALT 05/29/2022 19  10 - 49 U/L Final    Alkaline Phosphatase 05/29/2022 83  46 - 116 U/L Final    LDH 05/29/2022 323 (H)  120 - 246 U/L Final    WBC 05/29/2022 4.6  3.6 - 11.2 10*9/L Final    RBC 05/29/2022 3.14 (L)  3.95 - 5.13 10*12/L Final    HGB 05/29/2022 10.2 (L)  11.3 - 14.9 g/dL Final    HCT 28/41/3244 28.4 (L)  34.0 - 44.0 % Final    MCV 05/29/2022 90.4  77.6 - 95.7 fL Final    MCH 05/29/2022 32.4  25.9 - 32.4 pg Final    MCHC 05/29/2022 35.8  32.0 - 36.0 g/dL Final    RDW 02/11/7251 16.2 (H)  12.2 - 15.2 % Final    MPV 05/29/2022 7.5  6.8 - 10.7 fL Final    Platelet 05/29/2022 555 (H)  150 - 450 10*9/L Final    Neutrophils % 05/29/2022 55.8  % Final    Lymphocytes % 05/29/2022 30.2  % Final    Monocytes % 05/29/2022 9.7  % Final    Eosinophils % 05/29/2022 2.9  % Final    Basophils % 05/29/2022 1.4  % Final    Absolute Neutrophils 05/29/2022 2.6  1.8 - 7.8 10*9/L Final    Absolute Lymphocytes 05/29/2022 1.4  1.1 - 3.6 10*9/L Final    Absolute Monocytes 05/29/2022 0.4  0.3 - 0.8 10*9/L Final    Absolute Eosinophils 05/29/2022 0.1  0.0 - 0.5 10*9/L Final    Absolute Basophils 05/29/2022 0.1  0.0 - 0.1 10*9/L Final    Anisocytosis 05/29/2022 Slight (A)  Not Present Final   Office Visit on 03/31/2022   Component Date Value Ref Range Status    TSH 03/31/2022 2.209  0.550 - 4.780 uIU/mL Final   Office Visit on 03/11/2022   Component Date Value Ref Range Status    Vitamin B-12 03/11/2022 675  211 - 911 pg/ml Final   Lab on 03/11/2022   Component Date Value Ref Range Status    Triglycerides 03/11/2022 104  0 - 150 mg/dL Final    Cholesterol 16/11/9602 190  <=200 mg/dL Final    HDL 54/10/8117 58  40 - 60 mg/dL Final    LDL Calculated 03/11/2022 147 (H)  40 - 99 mg/dL Final    NHLBI Recommended Ranges, LDL Cholesterol, for Adults (20+yrs) (ATPIII), mg/dL  Optimal              <829  Near Optimal        100-129  Borderline High     130-159  High                160-189  Very High            >=190  NHLBI Recommended Ranges, LDL Cholesterol, for Children (2-19 yrs), mg/dL  Desirable            <562  Borderline High     110-129  High                 >=130      VLDL Cholesterol Cal 03/11/2022 20.8  11 - 41 mg/dL Final    Chol/HDL Ratio 03/11/2022 3.3  1.0 - 4.5 Final    Non-HDL Cholesterol 03/11/2022 132 (H)  70 - 130 mg/dL Final    Non-HDL Cholesterol Recommended Ranges (mg/dL)  Optimal       <130  Near Optimal 130 - 159  Borderline High 160 - 189  High             190 - 219  Very High       >220      FASTING 03/11/2022 Unknown   Final    CD34 Enumeration 03/11/2022    Final    This test, utilizing analyte-specific reagents (ASR) was developed and its performance characteristics determined by the [facility]. It has not been cleared or approved by the U.S. Food and Drug Administration (FDA).  The FDA has determined that such clearance or approval is not necessary. This test is used for clinical purposes. It should not be regarded as investigational or for research.  * = ASR  = FDA approved reagents    The assay limit of quantitation is 0.01%.    CD34 03/11/2022 0.13  % Final    Pre-Apheresis CD34 03/11/2022 11  /uL Final    CD45 03/11/2022 100  % Final    LDH 03/11/2022 337 (H)  120 - 246 U/L Final    Sodium 03/11/2022 146 (H)  135 - 145 mmol/L Final    Potassium 03/11/2022 3.9  3.4 - 4.8 mmol/L Final    Chloride 03/11/2022 112 (H)  98 - 107 mmol/L Final    CO2 03/11/2022 26.0  20.0 - 31.0 mmol/L Final    Anion Gap 03/11/2022 8  5 - 14 mmol/L Final    BUN 03/11/2022 18  9 - 23 mg/dL Final    Creatinine 86/57/8469 0.91  0.55 - 1.02 mg/dL Final  BUN/Creatinine Ratio 03/11/2022 20   Final    eGFR CKD-EPI (2021) Female 03/11/2022 69  >=60 mL/min/1.19m2 Final    eGFR calculated with CKD-EPI 2021 equation in accordance with [employer] and [employer] of Nephrology Task Force recommendations.    Glucose 03/11/2022 91  70 - 179 mg/dL Final    Calcium 16/11/9602 9.4  8.7 - 10.4 mg/dL Final    Albumin 54/10/8117 4.0  3.4 - 5.0 g/dL Final    Total Protein 03/11/2022 7.6  5.7 - 8.2 g/dL Final    Total Bilirubin 03/11/2022 0.4  0.3 - 1.2 mg/dL Final    AST 14/78/2956 25  <=34 U/L Final    ALT 03/11/2022 22  10 - 49 U/L Final    Alkaline Phosphatase 03/11/2022 107  46 - 116 U/L Final    WBC 03/11/2022 8.8  3.6 - 11.2 10*9/L Final    RBC 03/11/2022 3.74 (L)  3.95 - 5.13 10*12/L Final    HGB 03/11/2022 11.9  11.3 - 14.9 g/dL Final    HCT 21/30/8657 33.5 (L)  34.0 - 44.0 % Final    MCV 03/11/2022 89.5  77.6 - 95.7 fL Final    MCH 03/11/2022 31.7  25.9 - 32.4 pg Final    MCHC 03/11/2022 35.5  32.0 - 36.0 g/dL Final    RDW 84/69/6295 14.5  12.2 - 15.2 % Final    MPV 03/11/2022 7.7  6.8 - 10.7 fL Final    Platelet 03/11/2022 659 (H)  150 - 450 10*9/L Final    Neutrophils % 03/11/2022 73.1  % Final    Lymphocytes % 03/11/2022 16.8  % Final    Monocytes % 03/11/2022 7.6  % Final    Eosinophils % 03/11/2022 1.7  % Final    Basophils % 03/11/2022 0.8  % Final    Absolute Neutrophils 03/11/2022 6.4  1.8 - 7.8 10*9/L Final    Absolute Lymphocytes 03/11/2022 1.5  1.1 - 3.6 10*9/L Final    Absolute Monocytes 03/11/2022 0.7  0.3 - 0.8 10*9/L Final    Absolute Eosinophils 03/11/2022 0.1  0.0 - 0.5 10*9/L Final    Absolute Basophils 03/11/2022 0.1  0.0 - 0.1 10*9/L Final       Imaging:  Results for orders placed or performed during the hospital encounter of 09/08/21   MRI brain with and without contrast    Narrative    EXAM: Magnetic resonance imaging, brain, without and with contrast material.  DATE: 09/08/2021 7:05 PM  ACCESSION: [id number]  DICTATED: 09/09/2021 8:57 AM  INTERPRETATION LOCATION: [facility]    CLINICAL INDICATION: 67 years old Female with loss of consciousness ; Memory loss ; Mental status change, persistent or worsening  - R29.6 - Recurrent falls - R40.20 - Loss of consciousness (CMS - HCC)      COMPARISON: None.    TECHNIQUE: Multiplanar, multisequence MR imaging of the brain was performed without and with I.V. contrast.    FINDINGS:    No areas of restricted diffusion to suggest acute ischemic infarct. Global cerebral atrophy with ex vacuo dilation of ventricles. No findings to suggest transependymal flow of CSF.     Scattered periventricular and deep white matter T2/FLAIR hyperintensities.    No findings to suggest acute intracranial hemorrhage. No areas of abnormal contrast enhancement. No evidence of mass effect. Mucosal thickening of the right maxillary sinus. Orbits are unremarkable.        Impression    -Scattered periventricular and deep white matter T2  hyperintensities, which is nonspecific but can be seen in chronic small vessel ischemic disease.    -Diffuse global cerebral atrophy with resultant dilation of ventricles. The ventricles are somewhat greater in size than would be expected for the degree of atrophy. While this finding is indeterminate, it could be seen in normal pressure hydrocephalus. Recommend correlation with clinical findings.        Billing by Time Attestation:   I personally spent 45 minutes face-to-face and non-face-to-face in the care of this patient, which includes all pre, intra, and post visit time on the date of service.  All documented time was specific to the E/M visit and does not include any procedures that may have been performed.

## 2022-07-13 NOTE — Unmapped (Addendum)
[email or web address]     Start doing physical exercise at least 5 days per week, for 30 minutes at a time.   Easiest aerobic exercise is to walk vigorously (you should feel a little out of breath, and get a little sweaty if you're doing it at the right intensity).  Alternatives if you have joint pain include: stationary bike, regular bike, elliptical trainer, swimming, even boxing or dancing.   When the weather doesn't cooperate, go to Youtube.[email or web address], and search for one of the following: Silver Sneakers,  Engineer, structural.   Non-aerobic exercise is important too! Try Tai Chi or Yoga for improved balance, and do strength training with Therabands or hand weights heavy enough that you can only do the exercise 12-14 times before you can't move it fully anymore.  You should mix it up - some days concentrate on strengthening, other days on balance, and yet other days on cardio - sustained increase in heart rate.

## 2022-07-15 MED FILL — OJJAARA 200 MG TABLET: ORAL | 30 days supply | Qty: 30 | Fill #2

## 2022-08-05 MED ORDER — OJJAARA 200 MG TABLET
ORAL_TABLET | Freq: Every day | ORAL | 2 refills | 30 days
Start: 2022-08-05 — End: ?

## 2022-08-05 NOTE — Unmapped (Signed)
Pt is requesting refill    Most recent clinic visit: 05/29/2022  Next clinic visit:  08/25/2022

## 2022-08-05 NOTE — Unmapped (Signed)
[facility] Refill Coordination Note    Specialty Medication(s) to be Shipped:   Hematology/Oncology: Christy Buchanan 200mg     Other medication(s) to be shipped: No additional medications requested for fill at this time     Christy Buchanan, DOB: Apr 27, 1955  Phone: [phone] (home)       All above HIPAA information was verified with patient.     Was a Nurse, learning disability used for this call? No    Completed refill call assessment today to schedule patient's medication shipment from the [facility] [phone]).  All relevant notes have been reviewed.     Specialty medication(s) and dose(s) confirmed: Regimen is correct and unchanged.   Changes to medications: Christy Buchanan reports no changes at this time.  Changes to insurance: No  New side effects reported not previously addressed with a pharmacist or physician: None reported  Questions for the pharmacist: No    Confirmed patient received a Conservation officer, historic buildings and a Surveyor, mining with first shipment. The patient will receive a drug information handout for each medication shipped and additional FDA Medication Guides as required.       DISEASE/MEDICATION-SPECIFIC INFORMATION        N/A    SPECIALTY MEDICATION ADHERENCE     Medication Adherence    Patient reported X missed doses in the last month: 1  Specialty Medication: Ojjaara 200mg   Patient is on additional specialty medications: No  Patient is on more than two specialty medications: No  Any gaps in refill history greater than 2 weeks in the last 3 months: no  Demonstrates understanding of importance of adherence: yes  Informant: patient  Confirmed plan for next specialty medication refill: delivery by pharmacy  Refills needed for supportive medications: yes, ordered or provider notified          Refill Coordination    Has the Patients' Contact Information Changed: No  Is the Shipping Address Different: No         Were doses missed due to medication being on hold? No    OJJAARA 200   mg: 19 days of medicine on hand       REFERRAL TO PHARMACIST     Referral to the pharmacist: Not needed      [facility]     Shipping address confirmed in Epic.       Delivery Scheduled: Yes, Expected medication delivery date: 08/12/2022.  However, Rx request for refills was sent to the provider as there are none remaining.     Medication will be delivered via Same Day Courier to the prescription address in Epic WAM.    Christy Buchanan   [facility] Specialty Technician

## 2022-08-10 MED ORDER — MOMELOTINIB 200 MG TABLET
ORAL_TABLET | Freq: Every day | ORAL | 2 refills | 30 days | Status: CP
Start: 2022-08-10 — End: ?
  Filled 2022-08-12: qty 30, 30d supply, fill #0

## 2022-08-20 DIAGNOSIS — D471 Chronic myeloproliferative disease: Principal | ICD-10-CM

## 2022-08-25 ENCOUNTER — Telehealth
Admit: 2022-08-25 | Discharge: 2022-08-25 | Payer: MEDICARE | Attending: Hematology & Oncology | Primary: Hematology & Oncology

## 2022-08-25 ENCOUNTER — Ambulatory Visit: Admit: 2022-08-25 | Discharge: 2022-08-25 | Payer: MEDICARE

## 2022-08-25 DIAGNOSIS — E119 Type 2 diabetes mellitus without complications: Principal | ICD-10-CM

## 2022-08-25 DIAGNOSIS — D471 Chronic myeloproliferative disease: Principal | ICD-10-CM

## 2022-08-25 LAB — COMPREHENSIVE METABOLIC PANEL
ALBUMIN: 4.4 g/dL (ref 3.9–4.9)
ALKALINE PHOSPHATASE: 88 IU/L (ref 44–121)
ALT (SGPT): 19 IU/L (ref 0–32)
AST (SGOT): 20 IU/L (ref 0–40)
BILIRUBIN TOTAL (MG/DL) IN SER/PLAS: 0.3 mg/dL (ref 0.0–1.2)
BLOOD UREA NITROGEN: 20 mg/dL (ref 8–27)
BUN / CREAT RATIO: 19 (ref 12–28)
CALCIUM: 9.7 mg/dL (ref 8.7–10.3)
CHLORIDE: 104 mmol/L (ref 96–106)
CO2: 21 mmol/L (ref 20–29)
CREATININE: 1.07 mg/dL — ABNORMAL HIGH (ref 0.57–1.00)
EGFR: 57 mL/min/{1.73_m2} — ABNORMAL LOW
GLOBULIN, TOTAL: 2.5 g/dL (ref 1.5–4.5)
GLUCOSE: 93 mg/dL (ref 70–99)
POTASSIUM: 4.3 mmol/L (ref 3.5–5.2)
SODIUM: 140 mmol/L (ref 134–144)
TOTAL PROTEIN: 6.9 g/dL (ref 6.0–8.5)

## 2022-08-25 LAB — CBC W/ DIFFERENTIAL
BANDED NEUTROPHILS ABSOLUTE COUNT: 0 10*3/uL (ref 0.0–0.1)
BASOPHILS ABSOLUTE COUNT: 0.1 10*3/uL (ref 0.0–0.2)
BASOPHILS RELATIVE PERCENT: 1 %
EOSINOPHILS ABSOLUTE COUNT: 0.1 10*3/uL (ref 0.0–0.4)
EOSINOPHILS RELATIVE PERCENT: 2 %
HEMATOCRIT: 31.9 % — ABNORMAL LOW (ref 34.0–46.6)
HEMOGLOBIN: 10.5 g/dL — ABNORMAL LOW (ref 11.1–15.9)
IMMATURE GRANULOCYTES: 1 %
LYMPHOCYTES ABSOLUTE COUNT: 2.1 10*3/uL (ref 0.7–3.1)
LYMPHOCYTES RELATIVE PERCENT: 35 %
MEAN CORPUSCULAR HEMOGLOBIN CONC: 32.9 g/dL (ref 31.5–35.7)
MEAN CORPUSCULAR HEMOGLOBIN: 29.8 pg (ref 26.6–33.0)
MEAN CORPUSCULAR VOLUME: 91 fL (ref 79–97)
MONOCYTES ABSOLUTE COUNT: 0.6 10*3/uL (ref 0.1–0.9)
MONOCYTES RELATIVE PERCENT: 9 %
NEUTROPHILS ABSOLUTE COUNT: 3.1 10*3/uL (ref 1.4–7.0)
NEUTROPHILS RELATIVE PERCENT: 52 %
PLATELET COUNT: 473 10*3/uL — ABNORMAL HIGH (ref 150–450)
RED BLOOD CELL COUNT: 3.52 x10E6/uL — ABNORMAL LOW (ref 3.77–5.28)
RED CELL DISTRIBUTION WIDTH: 15.6 % — ABNORMAL HIGH (ref 11.7–15.4)
WHITE BLOOD CELL COUNT: 6.1 10*3/uL (ref 3.4–10.8)

## 2022-08-25 LAB — LACTATE DEHYDROGENASE: LACTATE DEHYDROGENASE: 334 IU/L — ABNORMAL HIGH (ref 119–226)

## 2022-08-25 NOTE — Unmapped (Signed)
ID: Christy Buchanan is a 67 y.o. with a JAK2+ MPN     DZ CHAR: ET (JAK2+)  BM Bx (2018):  Increased atypical megakaryocytes with clustering  2% blasts  JAK2 mutation (VAF 3.5%)  Nl karyotype   Cellularity from 40 - 50%  Patchy mild increase in reticulin; no increase in collagen; no fibrosis  No monoclonal B or T cell populations  IPSET 2: High risk (age, JAK2)   CV Risk  LDL: 110  HgbA1c: 5.3  BP: Borderline  Tx:   Hydrea (DC'd due to inc MPN Sx  Ruxolitnib (DC'd due to anemia)  Momelotinib     ASSESSMENT:   Christy Buchanan is a 67 y.o. 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  At the time of her diagnosis, she had no circulating immature or CD34+ cells, no increase in fibrosis, a normal LDH, and no palpable splenomegaly.      She was transitioned from hydroxyurea to ruxolitinib due to increased MPN related sx.  This change reduced her MPN symptom score initially from 48 to 21.  Since then, her score has continued rise likely because we have had to reduce her dose to accommodate a drop in her hemoglobin.  This dose reduction has also led to an increase in her platelets. Given this, we switched to momalotinib.       Since switching to momelotinib, she has had control of her platelets while maintaining her hemoglobin.  In addition, her symptom score has improved.      Over the course of the past year, it has become clear that her neurologic function has declined.  This decline is well documented by her neurologist.  She has been diagnosed with NPH, which has been treated, and small vessel vascular dementia.  There is concern that her MPN is contributing to this process.  It is not entirely clear.  That said, controlling her MPN is a very prudent intervention.      PLAN:   1) Continue Momelotinib at 200 mg every day  2) Continue to work on her risk factors:   BP  Lipids: She is on good treatment. We can recheck her lipids in 3 months  3) Exercise: I encouraged her to continue with exercise  4) Aspirin: This can be reduced to once a day   5) Repeat her myeloid mutation panel to get an idea if her disease is progressing.   6) RTC in  3 months   7) We should talk about her neuropathy in more detail at her next visit.     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  04/2016: Began Hydrea at 500 mg every day; increased to 1000 mg  05/2016: BM biopsy   Increased atypical megakaryocytes with clustering  No fibrosis, 2% blasts  JAK2 mutation (VAF 3.5%)  Nl karyotype   Cellularity from 40 - 50%  Patchy mild increase in reticulin; no increase in collagen  No monoclonal B or T cell populations    03/15/19: Seen at [facility]  CBC: 7.3/12.0/312  LDH: WNL  CD34: 0.02  VWF: Normal   MPN score: 48 to 49  Began ruxolitinib 10 mg BID    04/05/19  CBC: 7.3/12.0/312  MPN score: 36 - 38  Ruxolitinib 10 mg BID; Hydrea was stopped     05/17/19: Ruxolitinib 20 mg BID  LDH: 1.30 x ULN  MPN Score: 27    05/31/19: Ruxolitinib 15 mg BID (  decrease due to anemia of 10.6)    07/12/19: Ruxolitinib 10 mg BID (decrease due to anemia of 10.4, plts 477); added Hydrea 500 mg a day   CD34: 0.12  LDH: 1.42 x ULN  MPN Score: 32    10/25/19: Ruxolitinib 5 mg BID (continued anemia); Hydrea at 500 mg   LDH: 1.29 x ULN  MPN Score: 31    03/01/19:  Ruxolitinib 5 mg BID; Hydrea at 500 mg   LDH: 1.39 x ULN  MPN Score: 23    05/09/20:  Ruxolitinib 5mg  qAM and 10mg  qPM;  Stopped hydrea    06/12/20: Ruxolitinib 5mg  qAM and 10mg  qPM;  LDH 1.39 x ULN  MPN score: 34 but reports feeling better than last visit even with higher score.    08/29/20: 4.8/11.0/431  02/27/21: 6.2/11.4/477; Ruxolitinib 10 mg BID  MPN: 21  Xol: 159  LDH: 1.17 x ULN  09/09/21: 5.4/10.7/495  03/11/22: 8.8/11.9/659;   CD34: 13; B12 634; LDH 337;   HDL: 58; Non-HDL: 132  05/11/22: Momelotinib 200 mg QD  05/29/22: 4.6/10.2/555  HgbA1c: 5.3  07/13/22: Neuropsychometric assessment is c/w subcortical pattern of cognitive impairment, not consistent with Alzheimer's; c/w severe cerebrovascular disease and NPH  08/24/22: 6.1/10.5/473    INTERVAL HX:  Christy Buchanan participated in a video visit for her ET.  Christy Buchanan comes for FU of her ET.  We discussed her issues surrounding her cognition.  We reviewed her history and the reports of her neurologists/PMR.      She reports tolerating her Ojjaara    She noted having occasional rashes; she has occasional bumps    She denies any itching  No fever  She was seen by a neurologist to discuss her cognitive issues  The neurologist feels she does not need to be followed   Driving test: She took a driving test in a simulator; but the simulator made her nauseous   She is still driving; she does not drive at night and is only going short distances.    Fatigue:  She tends to get overtired about 2 to 3 days a week.    She had mechanical fall about one month ago and twisted her ankle  She did not go to the ER.    Her ankle is better  She does not some problems w/ sciatica; she is using topical treatments (with Frankincense/Myrrh)   She is using compression socks   She is planning to join the silver sneakers at the [facility]; she has already done the introductory tour.  There is a pool, cardio equip, etc.  She is walking more and using hiking poles.    The rest of her ROS is given below.     MPN 10 Score  Symptom (Absent 0 to 10 worst imaginable) 2/21 2/21 4/21 6/21 9/21  1/22  5/22 1/23 8/23 1/24 4/24 7/16   Fatigue in the past 24 hours 6 9 7 9 7  8 6  5 5 5 5 3    Filling up quickly when you eat (Early satiety)  7 7 5 5 5 5 4   0 5 5 2  2-3   Abdominal discomfort  3 4 4 4 4 5  4  2   0 6 4 - 5 2; better with gas-ex    Inactivity 5 5 - 6 5 7 5 4  4  4 2  3 6   She is beginning to get out more often. 5   Problems with concentration - 6  7-8 6 7 6 2  5  3 6 6  6-7 Can't read for long periods  4   Numbness/ Tingling (in my hands and feet) 5 to 6 0 0 0 0 0  0  0 0 0 0 Tops of hands and feet from up feel like someone else  - not numb - can   Neuropathy 3 - annoying   Her tinnitus is worse   Night sweats 0 0 1 0 0 0  1 0 4 2-3 0 0   Itching (pruritus) 10 7 0 0 3 3   7 7 7 5 4 1  - spates of itching  Epsom slts/ summer is worse of  bleach baths    Bone pain (diffuse not joint pain or arthritis) 6 6 3  0 1 0  3  0 2 -3 0 4-5 Not consistent; her sciatica is the biggest problem; 3     Fever (>100 F) 0 0 0 0 0 0  0  0 0 0 0 0   Unintentional weight loss last 6 months 0 0 0 0 0 0  0  0 1 - 2 0 0 0   TOTAL 48 - 49 36 - 38 27 32 31 27   34 21 32 - 34 32-34 31 - 34 24     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; PR pos, positive margin  L Mastectomy - no malignancy; neg LN  Txed with Surgery, radiation, auto BMT, raloxifene  Negative WU  for 28 genes associated with hereditary cancers  Raloxifene  Sjogren's syndrome  Autoimmune thyroiditis - Txed with methimazole   GERD - Txed with PPI  Mixed hyperlipidemia - Tx with Lovaza, pravastatin     FHx:  Daughter w/ spina bifida, BRCA 1081G>A mutation (w/o cancer)      2016 WHO Diagnostic Criteria for MPNs    ET  Major Criteria  Plts > 450  BM biopsy showing proliferation mainly of the megakaryocyte lineage with increased numbers of enlarged, mature megakaryocytes with hyperlobulated nuclei. No significant left-shift of neutrophil granulopoiesis or erythropoiesis and very rarely minor (grade 1) increase in reticulin fibers  Not meeting WHO criteria for BCR-ABL1?+?CML, PV, PMF, MDS, or other myeloid neoplasms)  Presence of JAK2, CALR or MPL mutation    Minor Criteria  Presence of a clonal marker (e.g., abnormal karyotype)  Absence of evidence for reactive thrombocytosis    Diagnosis: All major criteria or first three major and one minor     PMF  Major criteria  Proliferation and atypia of megakaryocytes accompanied by either reticulin and/or collagen fibrosis  grades 2 or 3 on a scale of 0 to 3  Not meeting WHO criteria for ET, PV, BCR-ABL1+ CML, myelodysplastic syndromes, or other myeloid neoplasm  Presence of JAK2, CALR or MPL mutation or in the absence of these mutations, presence of another clonal marker or absence of reactive myelofibrosis    Minor Criteria  Anemia not attributed to another comorbid condition  WBC > 11.0  Palpable Splenomegaly   LDH > ULN  Leukoerythroblastosis     Diagnosis: 3 major and at least 1 minor confirmed in two consecutive determinations    IPSET Model for Prediction of Thrombosis    Age:    2   < 60: 0   > 60: 2  WBC count (x10e9)  0   < 11: 0   > 11: 1  Hx of Thrombosis  0   No:  0   Yes: 1  --------------------------------------------  Total:    2     10 yr thrombosis risk, median survival (yrs)  Lo Risk: 0 pts    --> 89%, NR  Int Risk: 1-2 pts --> 86%, 24.5  Hi Risk: 3-4 pts  --> 69%, 14.7    IPSET II  Age:    1   < 60: 0   > 60: 1  CVS Risk Factor*  0   No: 0   Yes: 1   Hx of Thrombosis  0   No: 0   Yes: 2  JAK2     2   No: 0   Yes: 2  --------------------------------------------  Total:    3    *Gave up smoking 10 years ago, near optimal non-LDL on treatment, non HTN    Annual Thrombosis Risk  Lo Risk: 0-1 pts  --> 1.03%  Int Risk: 2 pts     --> 2.35%  Hi Risk: 3-6 pts  --> 3.56%     NOTE  JAK2 conveys a higher risk of thrombosis  CALR conveys a higher risk of bleeding    Newer Model:  [email or web address]    Primary prophylaxis of thrombosis  All patients: Aspirin unless    VWF activity <30%,   Platelet >?1 million,   CALR-mutated low-risk ET    PV with Hct >?45%: Add phlebotomy/cytoreduction to target Hct <?45%  Age >?60 years and/or prior history of thrombosis: Add cytoreduction  Secondary prophylaxis after thrombotic event  All patients: Cytoreduction  Typical VTE: Consider indefinite VKA for most patients; aspirin if not on VKA  Atypical VTE: Indefinite VKA  Arterial thrombosis: Aspirin      The patient reports they are physically located in [place] and is currently: at home. I conducted a audio/video visit. I spent  25m 21s on the video call with the patient. I spent an additional 15 minutes on pre- and post-visit activities on the date of service .

## 2022-09-04 LAB — INTELLIGEN(R) MYELOID

## 2022-09-04 NOTE — Unmapped (Signed)
[facility] Refill Coordination Note    Specialty Medication(s) to be Shipped:   Hematology/Oncology: Ojjaara 200mg     Other medication(s) to be shipped: No additional medications requested for fill at this time     Christy Buchanan, DOB: 06/10/1955  Phone: [phone] (home)       All above HIPAA information was verified with patient.     Was a Nurse, learning disability used for this call? No    Completed refill call assessment today to schedule patient's medication shipment from the [facility] [phone]).  All relevant notes have been reviewed.     Specialty medication(s) and dose(s) confirmed: Regimen is correct and unchanged.   Changes to medications: Svea reports no changes at this time.  Changes to insurance: No  New side effects reported not previously addressed with a pharmacist or physician: None reported  Questions for the pharmacist: No    Confirmed patient received a Conservation officer, historic buildings and a Surveyor, mining with first shipment. The patient will receive a drug information handout for each medication shipped and additional FDA Medication Guides as required.       DISEASE/MEDICATION-SPECIFIC INFORMATION        N/A    SPECIALTY MEDICATION ADHERENCE     Medication Adherence    Patient reported X missed doses in the last month: 0  Specialty Medication: Ojjaara 200mg   Patient is on additional specialty medications: No  Informant: patient              Were doses missed due to medication being on hold? No    Ojjaara 200mg    : 15 days of medicine on hand       REFERRAL TO PHARMACIST     Referral to the pharmacist: Not needed      [facility]     Shipping address confirmed in Epic.       Delivery Scheduled: Yes, Expected medication delivery date: 7/31.     Medication will be delivered via Same Day Courier to the prescription address in Epic WAM.    Westley Gambles   [facility] Specialty Technician

## 2022-09-10 MED FILL — OJJAARA 200 MG TABLET: ORAL | 30 days supply | Qty: 30 | Fill #1

## 2022-09-30 DIAGNOSIS — Z1211 Encounter for screening for malignant neoplasm of colon: Principal | ICD-10-CM

## 2022-09-30 NOTE — Unmapped (Signed)
[facility] : [facility] Refill Coordination Note    Specialty Medication(s) to be Shipped:   Hematology/Oncology: [place] Kung 200mg     Other medication(s) to be shipped: No additional medications requested for fill at this time     Christy Buchanan, DOB: 01/19/1956  Phone: [phone] (home)       All above HIPAA information was verified with patient.     Was a Nurse, learning disability used for this call? No    Completed refill call assessment today to schedule patient's medication shipment from the [facility] [phone]).  All relevant notes have been reviewed.     Specialty medication(s) and dose(s) confirmed: Regimen is correct and unchanged.   Changes to medications: Taleesha reports no changes at this time.  Changes to insurance: No  New side effects reported not previously addressed with a pharmacist or physician: None reported  Questions for the pharmacist: No    Confirmed patient received a Conservation officer, historic buildings and a Surveyor, mining with first shipment. The patient will receive a drug information handout for each medication shipped and additional FDA Medication Guides as required.       DISEASE/MEDICATION-SPECIFIC INFORMATION        N/A    SPECIALTY MEDICATION ADHERENCE     Medication Adherence    Patient reported X missed doses in the last month: 0  Specialty Medication: Ojjaara 200mg   Patient is on additional specialty medications: No  Patient is on more than two specialty medications: No  Any gaps in refill history greater than 2 weeks in the last 3 months: no  Demonstrates understanding of importance of adherence: yes  Informant: patient                Were doses missed due to medication being on hold? No    OJJAARA 200   mg: 20+  days of medicine on hand       REFERRAL TO PHARMACIST     Referral to the pharmacist: Not needed      [facility]     Shipping address confirmed in Epic.       Delivery Scheduled: Yes, Expected medication delivery date: 10/09/22 .     Medication will be delivered via Same Day Courier to the prescription address in Epic WAM.    Ricci Barker   [facility] Specialty Technician

## 2022-10-09 MED FILL — OJJAARA 200 MG TABLET: ORAL | 30 days supply | Qty: 30 | Fill #2

## 2022-10-22 DIAGNOSIS — K219 Gastro-esophageal reflux disease without esophagitis: Principal | ICD-10-CM

## 2022-10-22 MED ORDER — OMEPRAZOLE 20 MG CAPSULE,DELAYED RELEASE
ORAL_CAPSULE | Freq: Every day | ORAL | 1 refills | 0 days
Start: 2022-10-22 — End: ?

## 2022-10-23 MED ORDER — OMEPRAZOLE 20 MG CAPSULE,DELAYED RELEASE
ORAL_CAPSULE | Freq: Every day | ORAL | 1 refills | 90 days | Status: CP
Start: 2022-10-23 — End: ?

## 2022-10-28 DIAGNOSIS — D471 Chronic myeloproliferative disease: Principal | ICD-10-CM

## 2022-10-28 MED ORDER — OJJAARA 200 MG TABLET
ORAL_TABLET | Freq: Every day | ORAL | 2 refills | 30 days
Start: 2022-10-28 — End: ?

## 2022-10-28 NOTE — Unmapped (Signed)
Christy Buchanan has been contacted in regards to their refill of Ojjaara. At this time, they have declined refill due to patient having 28 doses remaining. Refill assessment call date has been updated per the patient's request.

## 2022-10-30 DIAGNOSIS — D471 Chronic myeloproliferative disease: Principal | ICD-10-CM

## 2022-10-30 MED ORDER — MOMELOTINIB 200 MG TABLET
ORAL_TABLET | Freq: Every day | ORAL | 2 refills | 30 days | Status: CP
Start: 2022-10-30 — End: ?
  Filled 2022-11-20: qty 30, 30d supply, fill #0

## 2022-11-16 NOTE — Unmapped (Signed)
[facility] Clinical Assessment & Refill Coordination Note    Christy Buchanan, DOB: 01/16/1956  Phone: [phone] (home)     All above HIPAA information was verified with patient.     Was a Nurse, learning disability used for this call? No    Specialty Medication(s):   Hematology/Oncology: Ojjaara     Current Outpatient Medications   Medication Sig Dispense Refill    acetaminophen (TYLENOL) 325 MG tablet Take 2 tablets (650 mg total) by mouth every four (4) hours as needed for fever (temp >38.5C).  0    atorvastatin (LIPITOR) 80 MG tablet Take 1 tablet (80 mg total) by mouth daily. 90 tablet 2    cetirizine (ZYRTEC) 10 MG tablet Take 1 tablet (10 mg total) by mouth daily as needed for allergies.      citalopram (CELEXA) 10 MG tablet Take 3 tablets (30 mg total) by mouth Three (3) times a day before meals. (Patient taking differently: Take 1 tablet (10 mg total) by mouth two (2) times a day. 10 mg in AM, 20 mg in PM) 90 tablet 0    Lactobacillus rhamnosus GG (CULTURELLE) 10 billion cell capsule Take 1 capsule by mouth daily.      momelotinib (OJJAARA) 200 mg tablet Take 1 tablet (200 mg total) by mouth daily. Swallow tablets whole; do not cut, crush, or chew. 30 tablet 2    multivitamin (TAB-A-VITE/THERAGRAN) per tablet Take 1 tablet by mouth daily.      naproxen sodium (ALEVE) 220 mg cap Take 1 tablet by mouth daily as needed. Headache, needed 2-3x last month      omega-3 fatty acids-fish oil 300-1,000 mg cap capsule Take 1 capsule by mouth in the morning.      omeprazole (PRILOSEC) 20 MG capsule TAKE ONE CAPSULE BY MOUTH ONE TIME DAILY 90 capsule 1    raloxifene (EVISTA) 60 mg tablet Take 1 tablet (60 mg total) by mouth daily. 90 tablet 3    simethicone (MYLICON) 80 MG chewable tablet Chew 1 tablet (80 mg total) every six (6) hours as needed for flatulence.      vitamins B1 B6 B12 Liqd Take by mouth every other day.       No current facility-administered medications for this visit.        Changes to medications: Bayan reports no changes at this time.    No Known Allergies    Changes to allergies: No    SPECIALTY MEDICATION ADHERENCE     Ojjaara 200 mg: 10 days of medicine on hand       Medication Adherence    Patient reported X missed doses in the last month: 1  Specialty Medication: Ojjaara 200mg   Patient is on additional specialty medications: No  Informant: patient          Specialty medication(s) dose(s) confirmed: Regimen is correct and unchanged.     Are there any concerns with adherence? No    Adherence counseling provided? Not needed    CLINICAL MANAGEMENT AND INTERVENTION      Clinical Benefit Assessment:    Do you feel the medicine is effective or helping your condition? Yes    Clinical Benefit counseling provided? Not needed    Adverse Effects Assessment:    Are you experiencing any side effects? No    Are you experiencing difficulty administering your medicine? No    Quality of Life Assessment:    Quality of Life    Rheumatology  Oncology  Dermatology  Cystic Fibrosis          How many days over the past month did your condition  keep you from your normal activities? For example, brushing your teeth or getting up in the morning. 0    Have you discussed this with your provider? Not needed    Acute Infection Status:    Acute infections noted within Epic:  No active infections  Patient reported infection: None    Therapy Appropriateness:    Is therapy appropriate based on current medication list, adverse reactions, adherence, clinical benefit and progress toward achieving therapeutic goals? Yes, therapy is appropriate and should be continued     DISEASE/MEDICATION-SPECIFIC INFORMATION      N/A    Oncology: Is the patient receiving adequate infection prevention treatment? Not applicable  Does the patient have adequate nutritional support? Not applicable    PATIENT SPECIFIC NEEDS     Does the patient have any physical, cognitive, or cultural barriers? No    Is the patient high risk? No    Did the patient require a clinical intervention? No    Does the patient require physician intervention or other additional services (i.e., nutrition, smoking cessation, social work)? No    SOCIAL DETERMINANTS OF HEALTH     At the [facility], we have learned that life circumstances - like trouble affording food, housing, utilities, or transportation can affect the health of many of our patients.   That is why we wanted to ask: are you currently experiencing any life circumstances that are negatively impacting your health and/or quality of life? No    Social Determinants of Health     Food Insecurity: No Food Insecurity (06/15/2022)    Hunger Vital Sign     Worried About Running Out of Food in the Last Year: Never true     Ran Out of Food in the Last Year: Never true   Recent Concern: Food Insecurity - Food Insecurity Present (06/02/2022)    Hunger Vital Sign     Worried About Running Out of Food in the Last Year: Sometimes true     Ran Out of Food in the Last Year: Sometimes true   Internet Connectivity: Not on file   Housing/Utilities: Low Risk  (06/15/2022)    Housing/Utilities     Within the past 12 months, have you ever stayed: outside, in a car, in a tent, in an overnight shelter, or temporarily in someone else's home (i.e. couch-surfing)?: No     Are you worried about losing your housing?: No     Within the past 12 months, have you been unable to get utilities (heat, electricity) when it was really needed?: No   Tobacco Use: Medium Risk (07/13/2022)    Patient History     Smoking Tobacco Use: Former     Smokeless Tobacco Use: Never     Passive Exposure: Not on file   Transportation Needs: Unmet Transportation Needs (06/15/2022)    PRAPARE - Therapist, art (Medical): Yes     Lack of Transportation (Non-Medical): Yes   Alcohol Use: Unknown (05/03/2017)    Received from [facility], [facility]    AUDIT-C     Frequency of Alcohol Consumption: Monthly or less Average Number of Drinks: Not on file     Frequency of Binge Drinking: Not on file   Interpersonal Safety: Unknown (11/16/2022)    Interpersonal Safety     Unsafe Where You  Currently Live: Not on file     Physically Hurt by Anyone: Not on file     Abused by Anyone: Not on file   Physical Activity: Not on file   Intimate Partner Violence: Not on file   Stress: Not on file   Substance Use: Not on file   Social Connections: Not on file   Financial Resource Strain: Medium Risk (06/15/2022)    Overall Financial Resource Strain (CARDIA)     Difficulty of Paying Living Expenses: Somewhat hard   Depression: Not at risk (05/29/2022)    PHQ-2     PHQ-2 Score: 0   Health Literacy: Not on file       Would you be willing to receive help with any of the needs that you have identified today? Not applicable       SHIPPING     Specialty Medication(s) to be Shipped:   Hematology/Oncology: Ojjaara    Other medication(s) to be shipped: No additional medications requested for fill at this time     Changes to insurance: No    Delivery Scheduled: Yes, Expected medication delivery date: 11/20/22.     Medication will be delivered via Next Day Courier to the confirmed prescription address in [facility].    The patient will receive a drug information handout for each medication shipped and additional FDA Medication Guides as required.  Verified that patient has previously received a Conservation officer, historic buildings and a Surveyor, mining.    The patient or caregiver noted above participated in the development of this care plan and knows that they can request review of or adjustments to the care plan at any time.      All of the patient's questions and concerns have been addressed.    Orley Lawry Vangie Bicker, PharmD   [facility] Specialty Pharmacist

## 2022-12-01 DIAGNOSIS — E039 Hypothyroidism, unspecified: Principal | ICD-10-CM

## 2022-12-08 ENCOUNTER — Ambulatory Visit
Admit: 2022-12-08 | Discharge: 2022-12-09 | Payer: MEDICARE | Attending: Hematology & Oncology | Primary: Hematology & Oncology

## 2022-12-08 ENCOUNTER — Encounter
Admit: 2022-12-08 | Discharge: 2022-12-09 | Payer: MEDICARE | Attending: Hematology & Oncology | Primary: Hematology & Oncology

## 2022-12-08 ENCOUNTER — Other Ambulatory Visit: Admit: 2022-12-08 | Discharge: 2022-12-09 | Payer: MEDICARE

## 2022-12-08 DIAGNOSIS — M3500 Sicca syndrome, unspecified: Principal | ICD-10-CM

## 2022-12-08 DIAGNOSIS — D471 Chronic myeloproliferative disease: Principal | ICD-10-CM

## 2022-12-08 DIAGNOSIS — E119 Type 2 diabetes mellitus without complications: Principal | ICD-10-CM

## 2022-12-08 DIAGNOSIS — E039 Hypothyroidism, unspecified: Principal | ICD-10-CM

## 2022-12-08 DIAGNOSIS — D473 Essential (hemorrhagic) thrombocythemia: Principal | ICD-10-CM

## 2022-12-08 LAB — CBC W/ AUTO DIFF
BASOPHILS ABSOLUTE COUNT: 0.1 10*9/L (ref 0.0–0.1)
BASOPHILS RELATIVE PERCENT: 1.3 %
EOSINOPHILS ABSOLUTE COUNT: 0.2 10*9/L (ref 0.0–0.5)
EOSINOPHILS RELATIVE PERCENT: 4 %
HEMATOCRIT: 31.4 % — ABNORMAL LOW (ref 34.0–44.0)
HEMOGLOBIN: 10.7 g/dL — ABNORMAL LOW (ref 11.3–14.9)
LYMPHOCYTES ABSOLUTE COUNT: 1.4 10*9/L (ref 1.1–3.6)
LYMPHOCYTES RELATIVE PERCENT: 28.6 %
MEAN CORPUSCULAR HEMOGLOBIN CONC: 34.2 g/dL (ref 32.0–36.0)
MEAN CORPUSCULAR HEMOGLOBIN: 30.7 pg (ref 25.9–32.4)
MEAN CORPUSCULAR VOLUME: 89.7 fL (ref 77.6–95.7)
MEAN PLATELET VOLUME: 7.9 fL (ref 6.8–10.7)
MONOCYTES ABSOLUTE COUNT: 0.4 10*9/L (ref 0.3–0.8)
MONOCYTES RELATIVE PERCENT: 8.4 %
NEUTROPHILS ABSOLUTE COUNT: 2.8 10*9/L (ref 1.8–7.8)
NEUTROPHILS RELATIVE PERCENT: 57.7 %
PLATELET COUNT: 504 10*9/L — ABNORMAL HIGH (ref 150–450)
RED BLOOD CELL COUNT: 3.5 10*12/L — ABNORMAL LOW (ref 3.95–5.13)
RED CELL DISTRIBUTION WIDTH: 16.1 % — ABNORMAL HIGH (ref 12.2–15.2)
WBC ADJUSTED: 4.9 10*9/L (ref 3.6–11.2)

## 2022-12-08 LAB — COMPREHENSIVE METABOLIC PANEL
ALBUMIN: 3.7 g/dL (ref 3.4–5.0)
ALKALINE PHOSPHATASE: 88 U/L (ref 46–116)
ALT (SGPT): 15 U/L (ref 10–49)
ANION GAP: 6 mmol/L (ref 5–14)
AST (SGOT): 18 U/L (ref <=34)
BILIRUBIN TOTAL: 0.5 mg/dL (ref 0.3–1.2)
BLOOD UREA NITROGEN: 17 mg/dL (ref 9–23)
BUN / CREAT RATIO: 18
CALCIUM: 9.5 mg/dL (ref 8.7–10.4)
CHLORIDE: 110 mmol/L — ABNORMAL HIGH (ref 98–107)
CO2: 27 mmol/L (ref 20.0–31.0)
CREATININE: 0.96 mg/dL
EGFR CKD-EPI (2021) FEMALE: 65 mL/min/{1.73_m2} (ref >=60)
GLUCOSE RANDOM: 80 mg/dL (ref 70–179)
POTASSIUM: 3.6 mmol/L (ref 3.4–4.8)
PROTEIN TOTAL: 6.8 g/dL (ref 5.7–8.2)
SODIUM: 143 mmol/L (ref 135–145)

## 2022-12-08 LAB — TSH: THYROID STIMULATING HORMONE: 3.648 u[IU]/mL (ref 0.550–4.780)

## 2022-12-08 LAB — C3 COMPLEMENT: C3 COMPLEMENT: 130 mg/dL (ref 90–170)

## 2022-12-08 LAB — C4 COMPLEMENT: C4 COMPLEMENT: 12.4 mg/dL (ref 12.0–36.0)

## 2022-12-08 LAB — T4, FREE: FREE T4: 1.13 ng/dL (ref 0.89–1.76)

## 2022-12-08 LAB — LACTATE DEHYDROGENASE: LACTATE DEHYDROGENASE: 305 U/L — ABNORMAL HIGH (ref 120–246)

## 2022-12-08 LAB — T3, FREE: T3 FREE: 2.86 pg/mL (ref 2.30–4.20)

## 2022-12-08 LAB — CHOLESTEROL, TOTAL: CHOLESTEROL: 152 mg/dL (ref <=200)

## 2022-12-08 MED ORDER — PEGASYS 180 MCG/ML SUBCUTANEOUS SOLUTION
SUBCUTANEOUS | 11 refills | 112 days | Status: CP
Start: 2022-12-08 — End: ?
  Filled 2022-12-18: qty 4, 28d supply, fill #0

## 2022-12-08 NOTE — Unmapped (Addendum)
PLAN:  1) Cough:   If it stops with stopping the momo ... Done  There are three reasons for chronic  Cough variant asthma  Sinus drainage  You can try Zyrtec-D twice a day   Reflux   Prilosec needs to be taken 1 hour or 2 hours after meal  2) ET  I sent a myeloid mutation panel: This will measure the JAK2 and look for other mutations.    I will send in a script peg-interferon (45 mcg)  Stop the momelotinib  Lab testing: Labs at 2, 4, 6, 8 weeks; I can see you at the three month mark.   Pharmacy video visit in two weeks.     The options are fedratinib.  It has a lot of GI side effects  Anagrelide:  This has no impact on symptoms.  It doesn't have any positive side effects on the bone marrow.    Interferon:  This is the best drug in that it lowers the JAK2 allele fraction.     - Its a shot that is given either weekly or every other week.    - It can cause muscle aches, flu like symptoms, depression   - It often takes very little to control platelets   - It is a long term project.    We need to stop the momolitinib.       All lab results last 24 hours:    Recent Results (from the past 24 hour(s))   Comprehensive Metabolic Panel    Collection Time: 12/08/22  7:21 AM   Result Value Ref Range    Sodium 143 135 - 145 mmol/L    Potassium 3.6 3.4 - 4.8 mmol/L    Chloride 110 (H) 98 - 107 mmol/L    CO2 27.0 20.0 - 31.0 mmol/L    Anion Gap 6 5 - 14 mmol/L    BUN 17 9 - 23 mg/dL    Creatinine 1.51 7.61 - 1.02 mg/dL    BUN/Creatinine Ratio 18     eGFR CKD-EPI (2021) Female 65 >=60 mL/min/1.44m2    Glucose 80 70 - 179 mg/dL    Calcium 9.5 8.7 - 60.7 mg/dL    Albumin 3.7 3.4 - 5.0 g/dL    Total Protein 6.8 5.7 - 8.2 g/dL    Total Bilirubin 0.5 0.3 - 1.2 mg/dL    AST 18 <=37 U/L    ALT 15 10 - 49 U/L    Alkaline Phosphatase 88 46 - 116 U/L   Lactate dehydrogenase    Collection Time: 12/08/22  7:21 AM   Result Value Ref Range    LDH 305 (H) 120 - 246 U/L   Cholesterol, Total    Collection Time: 12/08/22  7:21 AM   Result Value Ref Range    Cholesterol 152 <=200 mg/dL   T4, Free    Collection Time: 12/08/22  7:21 AM   Result Value Ref Range    Free T4 1.13 0.89 - 1.76 ng/dL   TSH    Collection Time: 12/08/22  7:21 AM   Result Value Ref Range    TSH 3.648 0.550 - 4.780 uIU/mL   CBC w/ Differential    Collection Time: 12/08/22  7:21 AM   Result Value Ref Range    WBC 4.9 3.6 - 11.2 10*9/L    RBC 3.50 (L) 3.95 - 5.13 10*12/L    HGB 10.7 (L) 11.3 - 14.9 g/dL    HCT 10.6 (L) 26.9 - 44.0 %  MCV 89.7 77.6 - 95.7 fL    MCH 30.7 25.9 - 32.4 pg    MCHC 34.2 32.0 - 36.0 g/dL    RDW 53.6 (H) 64.4 - 15.2 %    MPV 7.9 6.8 - 10.7 fL    Platelet 504 (H) 150 - 450 10*9/L    Neutrophils % 57.7 %    Lymphocytes % 28.6 %    Monocytes % 8.4 %    Eosinophils % 4.0 %    Basophils % 1.3 %    Absolute Neutrophils 2.8 1.8 - 7.8 10*9/L    Absolute Lymphocytes 1.4 1.1 - 3.6 10*9/L    Absolute Monocytes 0.4 0.3 - 0.8 10*9/L    Absolute Eosinophils 0.2 0.0 - 0.5 10*9/L    Absolute Basophils 0.1 0.0 - 0.1 10*9/L    Anisocytosis Slight (A) Not Present

## 2022-12-08 NOTE — Unmapped (Signed)
ID: Christy Buchanan is a 67 y.o. with a JAK2+ MPN     DZ CHAR: ET (JAK2+)  BM Bx (2018):  Increased atypical megakaryocytes with clustering  2% blasts  JAK2 mutation (VAF 3.5%)  Nl karyotype   Cellularity from 40 - 50%  Patchy mild increase in reticulin; no increase in collagen; no fibrosis  No monoclonal B or T cell populations  IPSET 2: High risk (age, JAK2)   CV Risk  LDL: 110  HgbA1c: 5.3  BP: Borderline  Tx:   Hydrea (DC'd due to inc MPN Sx  Ruxolitnib (DC'd due to anemia)  Momelotinib     ASSESSMENT:   Christy Buchanan is a 67 y.o. 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  At the time of her diagnosis, she had no circulating immature or CD34+ cells, no increase in fibrosis, a normal LDH, and no palpable splenomegaly.      She was transitioned from hydroxyurea to ruxolitinib due to increased MPN related sx.  This change reduced her MPN symptom score initially from 48 to 21.  Since then, her score has continued rise likely because we have had to reduce her dose to accommodate a drop in her hemoglobin.  This dose reduction has also led to an increase in her platelets. Given this, we switched to momalotinib.       Momalotinib has controlled her counts nicely.  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  The former is very likely associated with momalotinib and I suspect the latter may also be related.  In any case, we need to switch therapies.     I have recommended interferon alpha.  There is some risk of flaring her Sjogren's.  By stopping his momelotinib, we will get some idea if this drug is causing her cough.      PLAN:  1) Cough:   If it stops with stopping the momolotinib, we're done.   If not, there are several possible reasons for a chronic cough.    Cough variant asthma  Sinus drainage  She can try Zyrtec-D twice a day   Reflux   She can try Prilosec (which should be taken 1 hour before or 2 hours after a  meal)  If she shows no response to the above, I would refer her to pulmonary.  SS can cause pulmonary sx.    2) ET  I sent a myeloid mutation panel: This will measure the JAK2 and look for other mutations.    I will send in a script peg-interferon (45 mcg)  Stop the momelotinib  Lab testing: Labs at 2, 4, 6, 8 weeks; I will see her at the three month mark.   Pharmacy video visit in two weeks.     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  04/2016: Began Hydrea at 500 mg every day; increased to 1000 mg  05/2016: BM biopsy   Increased atypical megakaryocytes with clustering  No fibrosis, 2% blasts  JAK2 mutation (VAF 3.5%)  Nl karyotype   Cellularity from 40 - 50%  Patchy mild increase in reticulin; no increase in collagen  No monoclonal B or T cell populations    03/15/19: Seen at [facility]  CBC: 7.3/12.0/312  LDH: WNL  CD34: 0.02  VWF: Normal   MPN score: 48 to 49  Began ruxolitinib 10 mg BID    04/05/19  CBC: 7.3/12.0/312  MPN score: 36 -  38  Ruxolitinib 10 mg BID; Hydrea was stopped     05/17/19: Ruxolitinib 20 mg BID  LDH: 1.30 x ULN  MPN Score: 27    05/31/19: Ruxolitinib 15 mg BID (decrease due to anemia of 10.6)    07/12/19: Ruxolitinib 10 mg BID (decrease due to anemia of 10.4, plts 477); added Hydrea 500 mg a day   CD34: 0.12  LDH: 1.42 x ULN  MPN Score: 32    10/25/19: Ruxolitinib 5 mg BID (continued anemia); Hydrea at 500 mg   LDH: 1.29 x ULN  MPN Score: 31    03/01/19:  Ruxolitinib 5 mg BID; Hydrea at 500 mg   LDH: 1.39 x ULN  MPN Score: 23    05/09/20:  Ruxolitinib 5mg  qAM and 10mg  qPM;  Stopped hydrea    06/12/20: Ruxolitinib 5mg  qAM and 10mg  qPM;  LDH 1.39 x ULN  MPN score: 34 but reports feeling better than last visit even with higher score.    08/29/20: 4.8/11.0/431  02/27/21: 6.2/11.4/477; Ruxolitinib 10 mg BID  MPN: 21  Xol: 159  LDH: 1.17 x ULN  09/09/21: 5.4/10.7/495  03/11/22: 8.8/11.9/659;   CD34: 13; B12 634; LDH 337;   HDL: 58; Non-HDL: 132  05/11/22: Momelotinib 200 mg QD  05/29/22: 4.6/10.2/555  HgbA1c: 5.3  07/13/22: Neuropsychometric assessment is c/w subcortical pattern of cognitive impairment, not consistent with Alzheimer's; c/w severe cerebrovascular disease and NPH  08/24/22: 6.1/10.5/473; MPN Score: 24  12/08/22: 4.9/10.7/504; momelotinib DC'd due to peripheral neuropathy   12/24/22: Began interferon    INTERVAL HX:  Christy Schumacher comes for FU of her ET.  She has been on momelotinib for about 4 months     She notes having a bad few weeks mostly due to depression and stress; she is somewhat better now.   She has noted significant amount of coughing  This has been going on for months   It comes and goes  She has not been able to identify a pattern  There is no change with cold  She does have sneezing; she is on once a day Zyrtec D   It has been dry cough  without excess mucus  No reflux sx  She has no taste or sense of smell.    She was dxed COVID in Feb  She notes significant problems with peripheral neuropathy  The rest of her ROS is given below    PHYSICAL EXAM:  VS: As recorded in Epic  GENERAL: She appears well   HEENT: OP is clear; dentition is intact  LYMPH NODES: No LAN  LUNGS: CTA  COR: RRR w/o sign m/r/g  ABD: NTND; no HSM  EXT: No edema    MPN 10 Score  Symptom (Absent 0 to 10 worst imaginable) 2/21 2/21 4/21 6/21 9/21  1/22  5/22 1/23 8/23 1/24 4/24 7/24 10/24   Fatigue in the past 24 hours 6 9 7 9 7  8 6  5 5 5 5 3 5    Filling up quickly when you eat (Early satiety)  7 7 5 5 5 5 4   0 5 5 2  2-3 Wt loss: 7 lbs   Lack of appetite   2-3    Abdominal discomfort  3 4 4 4 4 5  4  2   0 6 4 - 5 2; better with gas-ex  0   Inactivity 5 5 - 6 5 7 5 4  4  4 2  3 6   She is beginning to  get out more often. 5 5   Problems with concentration - 6 7-8 6 7 6 2  5  3 6 6  6-7 Can't read for long periods  4 Feels duller 6   Numbness/ Tingling (in my hands and feet) 5 to 6 0 0 0 0 0  0  0 0 0 0 Tops of hands and feet from up feel like someone else  - not numb - can   Neuropathy 3 - annoying   Her tinnitus is worse Neuropathy hands and feet has neuropathy socks - can feels - have blocks at ice fells like sand paper    Night sweats 0 0 1 0 0 0  1 0 4 2-3 0 0 0   Itching (pruritus) 10 7 0 0 3 3   7 7 7 5 4 1  - spates of itching  Epsom slts/ summer is worse of  bleach baths  7   Bone pain (diffuse not joint pain or arthritis) 6 6 3  0 1 0  3  0 2 -3 0 4-5 Not consistent; her sciatica is the biggest problem; 3   0   Fever (>100 F) 0 0 0 0 0 0  0  0 0 0 0 0 0   Unintentional weight loss last 6 months 0 0 0 0 0 0  0  0 1 - 2 0 0 0 2   TOTAL 48 - 49 36 - 38 27 32 31 27   34 21 32 - 34 32-34 31 - 34 24      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; PR pos, positive margin  L Mastectomy - no malignancy; neg LN  Txed with Surgery, radiation, auto BMT, raloxifene  Negative WU  for 28 genes associated with hereditary cancers  Raloxifene  Sjogren's syndrome  Autoimmune thyroiditis - Txed with methimazole   GERD - Txed with PPI  Mixed hyperlipidemia - Tx with Lovaza, pravastatin     FHx:  Daughter w/ spina bifida, BRCA 1081G>A mutation (w/o cancer)      2016 WHO Diagnostic Criteria for MPNs    ET  Major Criteria  Plts > 450  BM biopsy showing proliferation mainly of the megakaryocyte lineage with increased numbers of enlarged, mature megakaryocytes with hyperlobulated nuclei. No significant left-shift of neutrophil granulopoiesis or erythropoiesis and very rarely minor (grade 1) increase in reticulin fibers  Not meeting WHO criteria for BCR-ABL1?+?CML, PV, PMF, MDS, or other myeloid neoplasms)  Presence of JAK2, CALR or MPL mutation    Minor Criteria  Presence of a clonal marker (e.g., abnormal karyotype)  Absence of evidence for reactive thrombocytosis    Diagnosis: All major criteria or first three major and one minor     PMF  Major criteria  Proliferation and atypia of megakaryocytes accompanied by either reticulin and/or collagen fibrosis  grades 2 or 3 on a scale of 0 to 3  Not meeting WHO criteria for ET, PV, BCR-ABL1+ CML, myelodysplastic syndromes, or other myeloid neoplasm  Presence of JAK2, CALR or MPL mutation or in the absence of these mutations, presence of another clonal marker or absence of reactive myelofibrosis    Minor Criteria  Anemia not attributed to another comorbid condition  WBC > 11.0  Palpable Splenomegaly   LDH > ULN  Leukoerythroblastosis     Diagnosis: 3 major and at least 1 minor confirmed in two consecutive determinations    IPSET Model for Prediction of Thrombosis  Age:    2   < 60: 0   > 60: 2  WBC count (x10e9)  0   < 11: 0   > 11: 1  Hx of Thrombosis  0   No: 0   Yes: 1  --------------------------------------------  Total:    2     10 yr thrombosis risk, median survival (yrs)  Lo Risk: 0 pts    --> 89%, NR  Int Risk: 1-2 pts --> 86%, 24.5  Hi Risk: 3-4 pts  --> 69%, 14.7    IPSET II  Age:    1   < 60: 0   > 60: 1  CVS Risk Factor*  0   No: 0   Yes: 1   Hx of Thrombosis  0   No: 0   Yes: 2  JAK2     2   No: 0   Yes: 2  --------------------------------------------  Total:    3    *Gave up smoking 10 years ago, near optimal non-LDL on treatment, non HTN    Annual Thrombosis Risk  Lo Risk: 0-1 pts  --> 1.03%  Int Risk: 2 pts     --> 2.35%  Hi Risk: 3-6 pts  --> 3.56%     NOTE  JAK2 conveys a higher risk of thrombosis  CALR conveys a higher risk of bleeding    Newer Model:  [email or web address]    Primary prophylaxis of thrombosis  All patients: Aspirin unless    VWF activity <30%,   Platelet >?1 million,   CALR-mutated low-risk ET    PV with Hct >?45%: Add phlebotomy/cytoreduction to target Hct <?45%  Age >?60 years and/or prior history of thrombosis: Add cytoreduction  Secondary prophylaxis after thrombotic event  All patients: Cytoreduction  Typical VTE: Consider indefinite VKA for most patients; aspirin if not on VKA  Atypical VTE: Indefinite VKA  Arterial thrombosis: Aspirin      The patient reports they are physically located in [place] and is currently: at home. I conducted a audio/video visit. I spent  0s on the video call with the patient. I spent an additional 15 minutes on pre- and post-visit activities on the date of service .

## 2022-12-09 DIAGNOSIS — D473 Essential (hemorrhagic) thrombocythemia: Principal | ICD-10-CM

## 2022-12-10 NOTE — Unmapped (Signed)
Addended by: Milus Glazier on: 12/10/2022 10:48 AM     Modules accepted: Orders

## 2022-12-11 LAB — ANA: ANTINUCLEAR ANTIBODIES (ANA): NEGATIVE

## 2022-12-11 NOTE — Unmapped (Signed)
Christy Buchanan is suffering from a peripheral neuropathy that will necessitate stopping her momelotinib.  She has agreed to start IFN.  I have explained what needs to be done.  I have not put in labs or appts to get her started. I have put in labs for her 3 month visit.     I would keep track of her cough.  If this gets worse, I would image her     Here is the plan:    1) Cough:   If it stops with stopping the momolotinib, we're done.   If not, there are several possible reasons for a chronic cough.    Cough variant asthma  Sinus drainage  She can try Zyrtec-D twice a day   Reflux   She can try Prilosec (which should be taken 1 hour before or 2 hours after a  meal)  If she shows no response to the above, I would refer her to pulmonary.  SS can cause pulmonary sx.    2) ET  I sent a myeloid mutation panel: This will measure the JAK2 and look for other mutations.    I will send in a script peg-interferon (45 mcg)  Stop the momelotinib  Lab testing: Labs at 2, 4, 6, 8 weeks; I will see her at the three month mark.   Pharmacy video visit in two weeks.     Thanks H

## 2022-12-15 MED ORDER — EMPTY CONTAINER
2 refills | 0 days
Start: 2022-12-15 — End: ?

## 2022-12-15 MED ORDER — SYRINGE WITH NEEDLE 1 ML 27 X 1/2"
2 refills | 0 days | Status: CP
Start: 2022-12-15 — End: ?

## 2022-12-15 NOTE — Unmapped (Signed)
[facility]    Patient Onboarding/Medication Counseling    Christy Buchanan is a 67 y.o. 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  I am speaking to the patient.    Was a Nurse, learning disability used for this call? No    Verified patient's date of birth / HIPAA.    Specialty medication(s) to be sent: Hematology/Oncology: Pegasys      Non-specialty medications/supplies to be sent: Transport planner, Syringes      Medications not needed at this time: n/a       Pegasys (Peginterferon Alfa-2a)    Medication & Administration     Dosage:   Essential thrombocythemia, advanced (off-label use): SUBQ: 45 mcg once weekly; adjust dose based on response or tolerance (doses ranged from 45 mcg once every 2 to 4 weeks to 90 mcg once weekly); continue as long as clinically benefiting    Administration: SQ dosing (Has patient been provided or will patient be provided in-clinic administration education? Yes)  Inject on the same day and approximately same time each week  May inject in the stomach (2 inches away from [employer]) or thigh.  And rotate sites with each injection.  Do not shake vial, prefilled syringe, or autoinjector.   Drink plenty of water while on therapy  If using a vial and syringe: Make sure you draw up the correct dose and use a new needle/syringe each time.  Remove cap from the vial and clean top of vial with alcohol swab.    Remove syringe from package and attach needle if needed  Remove cap from needle and draw air into the syringe matching the dose you will be injecting  Insert that air into the open area in the vial (not in liquid) by pushing the needle through the rubber stopper on the vial and pushing down on the plunger  Turn vial upside down (keeping needle in the vial) and then pull back on plunger to draw correct amount of medication.  Hold syringe with needle pointing toward ceiling and remove any air bubbles  Pinch a fold of skin between thumb and forefinger  Hold syringe like a pencil and insert in a quick dart-like motion at a 45-90 degree angle and push needle into the skin as far as it will go  Then remove hand use to pinch skin and use it to hold the syringe barrel and slightly pull back on plunger.  If blood comes up discard and repeat steps.    If no blood then inject the medication by gently pressing plunger all the way down until the syringe is empty.  Pull needle out of skin, cover with the green needle cover and dispose in a sharps container.   Wipe the area with an alcohol pad.  Medication Pearls/Tips:  Acetaminophen (Tylenol) 500 or 650 mg by mouth is often recommended before each Pegasys dose to reduce the chance of fever    Adherence/Missed dose instructions:   If within 2 days of the usual day of administration, administer dose as soon as possible then resume prior schedule  If >2 days after the usual day of administration then contact health care provider.    Goals of Therapy     Treat Polycythemia Vera    Side Effects & Monitoring Parameters   Common side effects:  Fatigue (56%)  Flu-like signs. These include fever (37%-54%), chills, shakes, and sweating (25%-35%)  Headache (27%-54%)  Anxiety, irritability, nervousness, depression (16%-18%)  Nausea or  vomiting (24%), loss of appetite, stomach pain, diarrhea (16%-18%)  Back, muscle, or joint pain (28%-37%)  Feeling dizzy, tired, or weak (<56%)  Hair loss (18%-23%)  Trouble sleeping (19%)  Irritation where the shot is given (22%)  Skin rash (10%)    The following side effects should be reported to the provider:   Signs of an allergic reaction (rash; hives; itching;  wheezing; tightness in the chest or throat; trouble breathing, swallowing, or talking; unusual hoarseness; or swelling of the mouth, face, lips, tongue, or throat)  Signs of high blood pressure (very bad headache or dizziness, passing out, or change in eyesight)  Signs of thyroid problems (change in weight; feeling nervous, excitable, restless, or weak; hair thinning; depression; neck swelling; not able to focus; trouble with heat or cold; menstrual changes; shakiness; or sweating)  Chest pain or pressure, a fast heartbeat, or an abnormal heartbeat.  Shortness of breath, a big weight gain, or swelling in the arms or legs.  Weakness on 1 side of the body, trouble speaking or thinking, change in balance, drooping on one side of the face, or blurred eyesight.  Memory problems or loss.  Not able to focus.  Signs of liver problems (dark urine, feeling tired, not hungry, upset stomach or stomach pain, light-colored stools, throwing up, or yellow skin or eyes)  Signs of severe bowel problems (colitis) have happened within 12 weeks of treatment with alpha interferons (severe stomach pain, bloody diarrhea, throwing up blood, or throw up that looks like coffee grounds)    Monitoring Parameters:  Visual exam at baseline  Baseline CBC; CMP; Uric acid; then weekly x 1 month; monthly for next 3-12 months; then every 6 months thereafter  Thyroid function test every 12 weeks  Monitor hepatic function closely during use  Pregnancy test for women    Contraindications, Warnings, & Precautions     Neuropsychiatric disorders: [US Boxed Warning]: May cause or exacerbate life-threatening neuropsychiatric disorders; monitor closely; discontinue treatment with worsening or persistently severe signs/symptoms of neuropsychiatric disorders. In most cases these effects were reversible following discontinuation, but not all cases. Neuropsychiatric adverse effects include depression, suicidal ideation, suicide attempt, homicidal ideation, drug overdose, and relapse of drug addiction, and may occur in patients with or without a prior history of psychiatric disorder. Avoid use in severe psychiatric disorders; use with extreme caution in patients with a history of depression.  May cause CNS depression, which may impair physical or mental abilities; patients must be cautioned about performing tasks that require mental alertness (eg, operating machinery or driving)   Dermatologic effects: Serious cutaneous reactions, including vesiculobullous eruptions, Stevens-Johnson syndrome, and exfoliative dermatitis, have been reported with use, with or without ribavirin therapy; discontinue with signs or symptoms of severe skin reactions.  Ophthalmic effects: Decreased or loss of vision and retinopathy, including macular edema, optic neuritis, papilledema, retinal hemorrhages, retinal detachment (serous), cotton wool spots, and retinal artery or vein thrombosis, may occur or be aggravated during treatment; if any ocular symptoms occur during use, a complete eye exam should be performed promptly. Prior to use, all patients should have a visual exam and patients with preexisting disorders (eg, diabetic or hypertensive retinopathy) should have exams periodically during therapy. Discontinue if new or worsening ophthalmologic disorders occur.  Gastrointestinal effects: Gastrointestinal hemorrhage, ulcerative and hemorrhagic/ischemic colitis (may be fatal) have been observed with interferon alfa treatment; may be severe and/or life-threatening; discontinue immediately if symptoms of colitis (eg, abdominal pain, bloody diarrhea, and/or fever) develop. Colitis generally resolves within 1  to 3 weeks of discontinuation  Pancreatitis: Pancreatitis, including fatal cases, has been observed with alfa interferon and ribavirin therapy. Withhold treatment for suspected pancreatitis; discontinue therapy for confirmed pancreatitis.  Pulmonary effects: May cause or aggravate dyspnea, pulmonary infiltrates, pneumonia, bronchiolitis obliterans, interstitial pneumonitis, pulmonary hypertension, and sarcoidosis, which may result in potentially fatal respiratory failure; may recur upon rechallenge with interferons. Monitor closely. Discontinue with pulmonary infiltrates or evidence of impaired pulmonary function. Use with caution in patients with pulmonary dysfunction or a history of pulmonary disease.    Drug/Food Interactions   Medication list reviewed in Epic. The patient was instructed to inform the care team before taking any new medications or supplements. No drug interactions identified.   Avoid alcohol while on this treatment    Storage, Handling Precautions, & Disposal   Store in refrigerator at 2??C to 8??C (36??F to 46??F).   Do not leave out of the refrigerator for more than 24 hours.   Do not freeze or shake.   Protect from light.   Discard any unused portion.   The following stability information has also been reported:  Intact vial: May be stored at room temperature for up to 14 days   Prefilled syringe: May be stored at room temperature for up to 6 days       Current Medications (including OTC/herbals), Comorbidities and Allergies     Current Outpatient Medications   Medication Sig Dispense Refill    acetaminophen (TYLENOL) 325 MG tablet Take 2 tablets (650 mg total) by mouth every four (4) hours as needed for fever (temp >38.5C).  0    atorvastatin (LIPITOR) 80 MG tablet Take 1 tablet (80 mg total) by mouth daily. 90 tablet 2    cetirizine (ZYRTEC) 10 MG tablet Take 1 tablet (10 mg total) by mouth daily as needed for allergies.      citalopram (CELEXA) 10 MG tablet Take 3 tablets (30 mg total) by mouth Three (3) times a day before meals. (Patient taking differently: Take 1 tablet (10 mg total) by mouth two (2) times a day. 10 mg in AM, 20 mg in PM) 90 tablet 0    Lactobacillus rhamnosus GG (CULTURELLE) 10 billion cell capsule Take 1 capsule by mouth daily.      momelotinib (OJJAARA) 200 mg tablet Take 1 tablet (200 mg total) by mouth daily. Swallow tablets whole; do not cut, crush, or chew. 30 tablet 2    multivitamin (TAB-A-VITE/THERAGRAN) per tablet Take 1 tablet by mouth daily.      naproxen sodium (ALEVE) 220 mg cap Take 1 tablet by mouth daily as needed. Headache, needed 2-3x last month omega-3 fatty acids-fish oil 300-1,000 mg cap capsule Take 1 capsule by mouth in the morning.      omeprazole (PRILOSEC) 20 MG capsule TAKE ONE CAPSULE BY MOUTH ONE TIME DAILY 90 capsule 1    peginterferon alfa-2a (PEGASYS) 180 mcg/mL injection Inject 0.25 mL (45 mcg total) under the skin every seven (7) days. 4 mL 11    raloxifene (EVISTA) 60 mg tablet Take 1 tablet (60 mg total) by mouth daily. 90 tablet 3    simethicone (MYLICON) 80 MG chewable tablet Chew 1 tablet (80 mg total) every six (6) hours as needed for flatulence.      vitamins B1 B6 B12 Liqd Take by mouth every other day.       No current facility-administered medications for this visit.       No Known Allergies    Patient Active  Problem List   Diagnosis    Sleep disorder    Family history of colon cancer    Family history of thyroid cancer    Gastroesophageal reflux disease    History of breast cancer    Mixed hyperlipidemia    Postmenopausal osteoporosis    Pure hypercholesterolemia    Sjogrens syndrome (CMS-HCC)    Vitamin D deficiency    Depression, major, single episode, complete remission (CMS-HCC)    Essential thrombocythemia (CMS-HCC)    Acquired hypothyroidism    JAK-2 gene mutation    MPN (myeloproliferative neoplasm) (CMS-HCC)    NPH (normal pressure hydrocephalus) (CMS-HCC)    Red blood cell antibody positive    Ventriculo-peritoneal shunt status    MCI (mild cognitive impairment) with memory loss       Reviewed and up to date in Epic.    Appropriateness of Therapy     Acute infections noted within Epic:  No active infections  Patient reported infection: None    Is the medication and dose appropriate based on diagnosis, medication list, comorbidities, allergies, medical history, patient???s ability to self-administer the medication, and therapeutic goals? Yes    Prescription has been clinically reviewed: Yes      Baseline Quality of Life Assessment      How many days over the past month did your condition  keep you from your normal activities? For example, brushing your teeth or getting up in the morning. 0    Financial Information     Medication Assistance provided: Prior Authorization    Anticipated copay of $0 reviewed with patient. Verified delivery address.    Delivery Information     Scheduled delivery date: 12/18/22    Expected start date: TBD      Medication will be delivered via Same Day Courier to the prescription address in [facility].  This shipment will not require a signature.      Explained the services we provide at [facility] and that each month we would call to set up refills.  Stressed importance of returning phone calls so that we could ensure they receive their medications in time each month.  Informed patient that we should be setting up refills 7-10 days prior to when they will run out of medication.  A pharmacist will reach out to perform a clinical assessment periodically.  Informed patient that a welcome packet, containing information about our pharmacy and other support services, a Notice of Privacy Practices, and a drug information handout will be sent.      The patient or caregiver noted above participated in the development of this care plan and knows that they can request review of or adjustments to the care plan at any time.      Patient or caregiver verbalized understanding of the above information as well as how to contact the pharmacy at [phone] option 4 with any questions/concerns.  The pharmacy is open Monday through Friday 8:30am-4:30pm.  A pharmacist is available 24/7 via pager to answer any clinical questions they may have.    Patient Specific Needs     Does the patient have any physical, cognitive, or cultural barriers? No    Does the patient have adequate living arrangements? (i.e. the ability to store and take their medication appropriately) Yes    Did you identify any home environmental safety or security hazards? No    Patient prefers to have medications discussed with  Patient     Is the patient  or caregiver able to read and understand education materials at a high school level or above? Yes    Patient's primary language is  English     Is the patient high risk? No    SOCIAL DETERMINANTS OF HEALTH     At the [facility], we have learned that life circumstances - like trouble affording food, housing, utilities, or transportation can affect the health of many of our patients.   That is why we wanted to ask: are you currently experiencing any life circumstances that are negatively impacting your health and/or quality of life? No    Social Determinants of Health     Food Insecurity: No Food Insecurity (06/15/2022)    Hunger Vital Sign     Worried About Running Out of Food in the Last Year: Never true     Ran Out of Food in the Last Year: Never true   Recent Concern: Food Insecurity - Food Insecurity Present (06/02/2022)    Hunger Vital Sign     Worried About Running Out of Food in the Last Year: Sometimes true     Ran Out of Food in the Last Year: Sometimes true   Internet Connectivity: Not on file   Housing/Utilities: Low Risk  (06/15/2022)    Housing/Utilities     Within the past 12 months, have you ever stayed: outside, in a car, in a tent, in an overnight shelter, or temporarily in someone else's home (i.e. couch-surfing)?: No     Are you worried about losing your housing?: No     Within the past 12 months, have you been unable to get utilities (heat, electricity) when it was really needed?: No   Tobacco Use: Medium Risk (07/13/2022)    Patient History     Smoking Tobacco Use: Former     Smokeless Tobacco Use: Never     Passive Exposure: Not on file   Transportation Needs: Unmet Transportation Needs (06/15/2022)    PRAPARE - Therapist, art (Medical): Yes     Lack of Transportation (Non-Medical): Yes   Alcohol Use: Unknown (05/03/2017)    Received from [facility], [facility]    AUDIT-C     Frequency of Alcohol Consumption: Monthly or less     Average Number of Drinks: Not on file     Frequency of Binge Drinking: Not on file   Interpersonal Safety: Unknown (12/15/2022)    Interpersonal Safety     Unsafe Where You Currently Live: Not on file     Physically Hurt by Anyone: Not on file     Abused by Anyone: Not on file   Physical Activity: Not on file   Intimate Partner Violence: Not on file   Stress: Not on file   Substance Use: Not on file (12/15/2022)   Social Connections: Not on file   Financial Resource Strain: Medium Risk (06/15/2022)    Overall Financial Resource Strain (CARDIA)     Difficulty of Paying Living Expenses: Somewhat hard   Depression: Not at risk (05/29/2022)    PHQ-2     PHQ-2 Score: 0   Health Literacy: Not on file       Would you be willing to receive help with any of the needs that you have identified today? Not applicable       Ximena Todaro Vangie Bicker, PharmD  [facility] Specialty Pharmacist

## 2022-12-17 NOTE — Unmapped (Signed)
[facility] Specialty Medication Onboarding    Specialty Medication: Pegasys  Prior Authorization: Approved   Financial Assistance: No - copay  <$25  Final Copay/Day Supply: $0 / 28    Insurance Restrictions: None     Notes to Pharmacist:   Credit Card on File: not applicable    The triage team has completed the benefits investigation and has determined that the patient is able to fill this medication at [facility]. Please contact the patient to complete the onboarding or follow up with the prescribing physician as needed.

## 2022-12-18 ENCOUNTER — Encounter
Admit: 2022-12-18 | Discharge: 2022-12-18 | Payer: MEDICARE | Attending: Hematology & Oncology | Primary: Hematology & Oncology

## 2022-12-18 MED FILL — EMPTY CONTAINER: 120 days supply | Qty: 1 | Fill #0

## 2022-12-24 ENCOUNTER — Ambulatory Visit: Admit: 2022-12-24 | Discharge: 2022-12-25 | Payer: MEDICARE | Attending: Oncology | Primary: Oncology

## 2022-12-24 DIAGNOSIS — D473 Essential (hemorrhagic) thrombocythemia: Principal | ICD-10-CM

## 2022-12-24 NOTE — Unmapped (Signed)
Clinical Pharmacist Practitioner: [name]     Christy Buchanan is a 67 y.o. female with essential thrombocythenia who I am seeing today for peginterferon alfa 2a counseling, and teaching of dose preparation and administration.    Current MPN therapy: none (momelotinib stopped 2 weeks ago due to peripheral neuropathy)    Plan:  -peginterferon started today, patient self administered 45 mcg/0.25 mLs subcutaneous in clinic  -continue peginterferon 45 mcg/0.25 mLs once weekly on friday, next dose 01/01/23  -Next CBC/d, CMP on 01/04/23  -Goal: hematocrit platelets 150-450 x 10^9/L      Counseled patient on:  -peginterferon: therapeutic rationale, dose and administration, lab monitoring/follow-up, adverse effects and management or safe handling, storage and disposal  -monitoring:   ---cbc/d, cmp every 2 weeks for first month, then every month for 3 months, then every 2-3 months  ---BBW: may cause or aggravate fatal or life-threatening neuropsychiatric, autoimmune, ischemic, and infectious disorders   ---signs and symptoms of infection, bleeding,skin irritation  -side effects: flu like symptoms, fatigue, headache, muscle aches, injection site reactions, myelosuppression    -Dose preparation and administration:   ---teaching per instructions below    Prescriptions:  -peginterferon 180 mcg/mL #75mLs (11 refills)  -Prescriptions sent electronically to [facility] on 12/08/22    Christy Buchanan demonstrated appropriate dose preparation, and subcutaneous administration, verbalized understanding of the education provided and had no further questions.     F/u: next labs on 01/04/23  ___________________________________________________________________    Patient reported following on 12/24/22:  -ok with self administration of peginterferon  -labs at [facility] in [facility] near her house  -has anxiety, on stable dose of citalopram 10mg  am, 20mg  pm (total of 30 mg daily)  -GAD7 score today 6 (mild anxiety), PHQ9 score 4 (no depression) -numbness in hands and feet still present, stopped momelotinib 2 weeks ago      During past 2 weeks  0-not at all  1 several days  2 more than half the days  3 nearly every day     GAD7 (over last 2 weeks) 12/24/22    Feeling nervous, anxious or on edge 1    Not being able to stop or control worrying 1    Worrying too much about different things 1    Trouble relaxing 1    Being so restless that it is hard to sit still 0    Becoming easily annoyed or irritable 1    Feeling afraid as if something awful  might happen 1    If you checked off any problems, how difficult have these problems made it for you to do your work, take care of things at home, or get along with other people? Somewhat difficult    GAD7 total 6  mild    During past 2 weeks  0 - not at all  1 - several days  2 - more than half the days  3 - nearly every day     PHQ9 12/24/22    1. Little interest or pleasure in doing things 1    2. Feeling down, depressed, or hopeless 1    3. Trouble falling or staying asleep, or  sleeping too much 0    4. Feeling tired or having little energy 1    5. Poor appetite or overeating 0    6. Feeling bad about yourself -- or that you are a failure or have let yourself or your family down 0    7. Trouble concentrating on things, such as reading the  newspaper or watching television 1    8. Moving or speaking so slowly that other people could have noticed? Or the opposite -- being so fidgety or restless that you have been moving around a lot more than usual 0    9. Thoughts that you would be better off dead or of hurting yourself in some way 0    If you checked off any problems, how difficult have these problems made it for you to do your work, take care of things at home, or get along with other people? Somewhat difficult    PHQ9 Total 4  none      Medication access:  Insurance: medicare  Coverage Dates: 12/12/22 through 12/11/23   -Copay: $0        Approximate face time spent with patient: 60 minutes     Audie Box, PharmD, BCOP, CPP  Clinical Pharmacist     Dose preparation and administration of peginterferon:   Inject on the same day and approximately same time each week  May inject in the stomach (2 inches away from [employer]) or thigh.  And rotate sites with each injection.  Do not shake vial.   Drink plenty of water while on therapy  Preparation for administration:  Allow vial to reach room temperature before use (at least 30 minutes); wait for condensation on the outside of the syringe or autoinjector to disappear before use.   The vial may be warmed by gently rolling in the palms of the hand for ~1 minute.     Wash hands and gather supplies - medication, syringe, alcohol swabs, sharps container, Band-Aid, cotton balls  Check medication for expiration date, check to make sure no particles floating in the liquid and clear colorless liquid  Clean injection site with alcohol swab and let dry.       If using a vial and syringe: Make sure you draw up the correct dose and use a new needle/syringe each time.  Remove cap from the vial and clean top of vial with alcohol swab.    Remove syringe from package and attach needle if needed  Remove cap from needle and draw air into the syringe matching the dose you will be injecting  Insert that air into the open area in the vial (not in liquid) by pushing the needle through the rubber stopper on the vial and pushing down on the plunger  Turn vial upside down (keeping needle in the vial) and then pull back on plunger to draw correct amount of medication.  Hold syringe with needle pointing toward ceiling and remove any air bubbles  Pinch a fold of skin between thumb and forefinger  Hold syringe like a pencil and insert in a quick dart-like motion at a 45-90 degree angle and push needle into the skin as far as it will go  Then remove hand use to pinch skin and use it to hold the syringe barrel and slightly pull back on plunger.  If blood comes up discard and repeat steps.    If no blood then inject the medication by gently pressing plunger all the way down until the syringe is empty.  Pull needle out of skin, cover with the green needle cover and dispose in a sharps container.   Wipe the area with an alcohol pad.     GAD7, PHQ9 scores

## 2022-12-25 MED FILL — BD TUBERCULIN SYRINGE 1 ML 27 X 1/2": 70 days supply | Qty: 10 | Fill #0

## 2023-01-04 DIAGNOSIS — D473 Essential (hemorrhagic) thrombocythemia: Principal | ICD-10-CM

## 2023-01-05 LAB — CBC W/ DIFFERENTIAL
BANDED NEUTROPHILS ABSOLUTE COUNT: 0 10*3/uL (ref 0.0–0.1)
BASOPHILS ABSOLUTE COUNT: 0.1 10*3/uL (ref 0.0–0.2)
BASOPHILS RELATIVE PERCENT: 1 %
EOSINOPHILS ABSOLUTE COUNT: 0.2 10*3/uL (ref 0.0–0.4)
EOSINOPHILS RELATIVE PERCENT: 2 %
HEMATOCRIT: 35.1 % (ref 34.0–46.6)
HEMOGLOBIN: 11.1 g/dL (ref 11.1–15.9)
IMMATURE GRANULOCYTES: 1 %
LYMPHOCYTES ABSOLUTE COUNT: 1.4 10*3/uL (ref 0.7–3.1)
LYMPHOCYTES RELATIVE PERCENT: 20 %
MEAN CORPUSCULAR HEMOGLOBIN CONC: 31.6 g/dL (ref 31.5–35.7)
MEAN CORPUSCULAR HEMOGLOBIN: 30 pg (ref 26.6–33.0)
MEAN CORPUSCULAR VOLUME: 95 fL (ref 79–97)
MONOCYTES ABSOLUTE COUNT: 0.7 10*3/uL (ref 0.1–0.9)
MONOCYTES RELATIVE PERCENT: 10 %
NEUTROPHILS ABSOLUTE COUNT: 4.6 10*3/uL (ref 1.4–7.0)
NEUTROPHILS RELATIVE PERCENT: 66 %
PLATELET COUNT: 471 10*3/uL — ABNORMAL HIGH (ref 150–450)
RED BLOOD CELL COUNT: 3.7 x10E6/uL — ABNORMAL LOW (ref 3.77–5.28)
RED CELL DISTRIBUTION WIDTH: 14.6 % (ref 11.7–15.4)
WHITE BLOOD CELL COUNT: 6.9 10*3/uL (ref 3.4–10.8)

## 2023-01-05 LAB — COMPREHENSIVE METABOLIC PANEL
ALBUMIN: 3.9 g/dL (ref 3.9–4.9)
ALKALINE PHOSPHATASE: 114 IU/L (ref 44–121)
ALT (SGPT): 12 IU/L (ref 0–32)
AST (SGOT): 19 IU/L (ref 0–40)
BILIRUBIN TOTAL (MG/DL) IN SER/PLAS: 0.4 mg/dL (ref 0.0–1.2)
BLOOD UREA NITROGEN: 18 mg/dL (ref 8–27)
BUN / CREAT RATIO: 17 (ref 12–28)
CALCIUM: 9.5 mg/dL (ref 8.7–10.3)
CHLORIDE: 105 mmol/L (ref 96–106)
CO2: 23 mmol/L (ref 20–29)
CREATININE: 1.06 mg/dL — ABNORMAL HIGH (ref 0.57–1.00)
EGFR: 57 mL/min/{1.73_m2} — ABNORMAL LOW
GLOBULIN, TOTAL: 2.5 g/dL (ref 1.5–4.5)
GLUCOSE: 97 mg/dL (ref 70–99)
POTASSIUM: 5.2 mmol/L (ref 3.5–5.2)
SODIUM: 141 mmol/L (ref 134–144)
TOTAL PROTEIN: 6.4 g/dL (ref 6.0–8.5)

## 2023-01-18 DIAGNOSIS — D473 Essential (hemorrhagic) thrombocythemia: Principal | ICD-10-CM

## 2023-01-19 LAB — COMPREHENSIVE METABOLIC PANEL
ALBUMIN: 3.9 g/dL (ref 3.9–4.9)
ALKALINE PHOSPHATASE: 109 IU/L (ref 44–121)
ALT (SGPT): 14 IU/L (ref 0–32)
AST (SGOT): 22 IU/L (ref 0–40)
BILIRUBIN TOTAL (MG/DL) IN SER/PLAS: 0.3 mg/dL (ref 0.0–1.2)
BLOOD UREA NITROGEN: 19 mg/dL (ref 8–27)
BUN / CREAT RATIO: 17 (ref 12–28)
CALCIUM: 9.2 mg/dL (ref 8.7–10.3)
CHLORIDE: 105 mmol/L (ref 96–106)
CO2: 23 mmol/L (ref 20–29)
CREATININE: 1.14 mg/dL — ABNORMAL HIGH (ref 0.57–1.00)
EGFR: 52 mL/min/{1.73_m2} — ABNORMAL LOW
GLOBULIN, TOTAL: 2.4 g/dL (ref 1.5–4.5)
GLUCOSE: 105 mg/dL — ABNORMAL HIGH (ref 70–99)
POTASSIUM: 5 mmol/L (ref 3.5–5.2)
SODIUM: 142 mmol/L (ref 134–144)
TOTAL PROTEIN: 6.3 g/dL (ref 6.0–8.5)

## 2023-01-19 LAB — CBC W/ DIFFERENTIAL
BANDED NEUTROPHILS ABSOLUTE COUNT: 0 10*3/uL (ref 0.0–0.1)
BASOPHILS ABSOLUTE COUNT: 0.1 10*3/uL (ref 0.0–0.2)
BASOPHILS RELATIVE PERCENT: 1 %
EOSINOPHILS ABSOLUTE COUNT: 0.2 10*3/uL (ref 0.0–0.4)
EOSINOPHILS RELATIVE PERCENT: 3 %
HEMATOCRIT: 33.9 % — ABNORMAL LOW (ref 34.0–46.6)
HEMOGLOBIN: 10.7 g/dL — ABNORMAL LOW (ref 11.1–15.9)
IMMATURE GRANULOCYTES: 0 %
LYMPHOCYTES ABSOLUTE COUNT: 1.4 10*3/uL (ref 0.7–3.1)
LYMPHOCYTES RELATIVE PERCENT: 19 %
MEAN CORPUSCULAR HEMOGLOBIN CONC: 31.6 g/dL (ref 31.5–35.7)
MEAN CORPUSCULAR HEMOGLOBIN: 29.8 pg (ref 26.6–33.0)
MEAN CORPUSCULAR VOLUME: 94 fL (ref 79–97)
MONOCYTES ABSOLUTE COUNT: 0.8 10*3/uL (ref 0.1–0.9)
MONOCYTES RELATIVE PERCENT: 10 %
NEUTROPHILS ABSOLUTE COUNT: 4.9 10*3/uL (ref 1.4–7.0)
NEUTROPHILS RELATIVE PERCENT: 67 %
PLATELET COUNT: 490 10*3/uL — ABNORMAL HIGH (ref 150–450)
RED BLOOD CELL COUNT: 3.59 x10E6/uL — ABNORMAL LOW (ref 3.77–5.28)
RED CELL DISTRIBUTION WIDTH: 14.6 % (ref 11.7–15.4)
WHITE BLOOD CELL COUNT: 7.4 10*3/uL (ref 3.4–10.8)

## 2023-01-21 NOTE — Unmapped (Signed)
Christy Buchanan has been contacted in regards to their refill of Pegasys. At this time, they have declined refill due to patient having 4 doses remaining. Refill assessment call date has been updated per the patient's request.

## 2023-02-01 DIAGNOSIS — D473 Essential (hemorrhagic) thrombocythemia: Principal | ICD-10-CM

## 2023-02-09 NOTE — Unmapped (Signed)
[facility] Clinical Assessment & Refill Coordination Note    Christy Buchanan, DOB: 07/20/1955  Phone: [phone] (home)     All above HIPAA information was verified with patient.     Was a Nurse, learning disability used for this call? No    Specialty Medication(s):   Hematology/Oncology: Pegasys     Current Outpatient Medications   Medication Sig Dispense Refill    acetaminophen (TYLENOL) 325 MG tablet Take 2 tablets (650 mg total) by mouth every four (4) hours as needed for fever (temp >38.5C).  0    atorvastatin (LIPITOR) 80 MG tablet Take 1 tablet (80 mg total) by mouth daily. 90 tablet 2    cetirizine (ZYRTEC) 10 MG tablet Take 1 tablet (10 mg total) by mouth daily as needed for allergies.      citalopram (CELEXA) 10 MG tablet Take 3 tablets (30 mg total) by mouth Three (3) times a day before meals. (Patient taking differently: Take 1 tablet (10 mg total) by mouth two (2) times a day. 10 mg in AM, 20 mg in PM) 90 tablet 0    empty container (SHARPS CONTAINER) Misc Use as directed 1 each 2    Lactobacillus rhamnosus GG (CULTURELLE) 10 billion cell capsule Take 1 capsule by mouth daily.      momelotinib (OJJAARA) 200 mg tablet Take 1 tablet (200 mg total) by mouth daily. Swallow tablets whole; do not cut, crush, or chew. 30 tablet 2    multivitamin (TAB-A-VITE/THERAGRAN) per tablet Take 1 tablet by mouth daily.      naproxen sodium (ALEVE) 220 mg cap Take 1 tablet by mouth daily as needed. Headache, needed 2-3x last month      omega-3 fatty acids-fish oil 300-1,000 mg cap capsule Take 1 capsule by mouth in the morning.      omeprazole (PRILOSEC) 20 MG capsule TAKE ONE CAPSULE BY MOUTH ONE TIME DAILY 90 capsule 1    peginterferon alfa-2a (PEGASYS) 180 mcg/mL injection Inject 0.25 mL (45 mcg total) under the skin every seven (7) days. 4 mL 11    raloxifene (EVISTA) 60 mg tablet Take 1 tablet (60 mg total) by mouth daily. 90 tablet 3    simethicone (MYLICON) 80 MG chewable tablet Chew 1 tablet (80 mg total) every six (6) hours as needed for flatulence.      syringe 1 mL 27 x 1/2 Syrg Take as directed. 50 each 2    vitamins B1 B6 B12 Liqd Take by mouth every other day.       No current facility-administered medications for this visit.        Changes to medications: Christy Buchanan reports no changes at this time.    No Known Allergies    Changes to allergies: No    SPECIALTY MEDICATION ADHERENCE     Pegasys 180  mcg/ml : 1 doses of medicine on hand       Medication Adherence    Patient reported X missed doses in the last month: 0  Specialty Medication: Pegasys 180 mcg/ml  Patient is on additional specialty medications: No  Informant: patient          Specialty medication(s) dose(s) confirmed: Regimen is correct and unchanged.     Are there any concerns with adherence? No    Adherence counseling provided? Not needed    CLINICAL MANAGEMENT AND INTERVENTION      Clinical Benefit Assessment:    Do you feel the medicine is effective or helping your condition?  Patient declined to answer    Clinical Benefit counseling provided? Not needed    Adverse Effects Assessment:    Are you experiencing any side effects? No    Are you experiencing difficulty administering your medicine? No    Quality of Life Assessment:    Quality of Life    Rheumatology  Oncology  Dermatology  Cystic Fibrosis          How many days over the past month did your condition  keep you from your normal activities? For example, brushing your teeth or getting up in the morning. Patient declined to answer    Have you discussed this with your provider? Not needed    Acute Infection Status:    Acute infections noted within Epic:  No active infections  Patient reported infection: None    Therapy Appropriateness:    Is therapy appropriate based on current medication list, adverse reactions, adherence, clinical benefit and progress toward achieving therapeutic goals? Yes, therapy is appropriate and should be continued     DISEASE/MEDICATION-SPECIFIC INFORMATION For patients on injectable medications: Patient currently has 1 doses left.  Next injection is scheduled for 02/12/23.    Oncology: Is the patient receiving adequate infection prevention treatment? Not applicable  Does the patient have adequate nutritional support? Not applicable    PATIENT SPECIFIC NEEDS     Does the patient have any physical, cognitive, or cultural barriers? No    Is the patient high risk? No    Did the patient require a clinical intervention? No    Does the patient require physician intervention or other additional services (i.e., nutrition, smoking cessation, social work)? No    SOCIAL DETERMINANTS OF HEALTH     At the [facility], we have learned that life circumstances - like trouble affording food, housing, utilities, or transportation can affect the health of many of our patients.   That is why we wanted to ask: are you currently experiencing any life circumstances that are negatively impacting your health and/or quality of life? No    Social Drivers of Health     Food Insecurity: No Food Insecurity (06/15/2022)    Hunger Vital Sign     Worried About Running Out of Food in the Last Year: Never true     Ran Out of Food in the Last Year: Never true   Recent Concern: Food Insecurity - Food Insecurity Present (06/02/2022)    Hunger Vital Sign     Worried About Running Out of Food in the Last Year: Sometimes true     Ran Out of Food in the Last Year: Sometimes true   Internet Connectivity: Not on file   Housing/Utilities: Low Risk  (06/15/2022)    Housing/Utilities     Within the past 12 months, have you ever stayed: outside, in a car, in a tent, in an overnight shelter, or temporarily in someone else's home (i.e. couch-surfing)?: No     Are you worried about losing your housing?: No     Within the past 12 months, have you been unable to get utilities (heat, electricity) when it was really needed?: No   Tobacco Use: Medium Risk (12/24/2022)    Patient History     Smoking Tobacco Use: Former Smokeless Tobacco Use: Never     Passive Exposure: Not on file   Transportation Needs: Unmet Transportation Needs (06/15/2022)    PRAPARE - Transportation     Lack of Transportation (Medical): Yes     Lack of  Transportation (Non-Medical): Yes   Alcohol Use: Unknown (05/03/2017)    Received from [facility], [facility]    AUDIT-C     Frequency of Alcohol Consumption: Monthly or less     Average Number of Drinks: Not on file     Frequency of Binge Drinking: Not on file   Interpersonal Safety: Not on file   Physical Activity: Not on file   Intimate Partner Violence: Not on file   Stress: Not on file   Substance Use: Not on file (12/14/2022)   Social Connections: Not on file   Financial Resource Strain: Medium Risk (06/15/2022)    Overall Financial Resource Strain (CARDIA)     Difficulty of Paying Living Expenses: Somewhat hard   Depression: Not at risk (05/29/2022)    PHQ-2     PHQ-2 Score: 0   Health Literacy: Not on file       Would you be willing to receive help with any of the needs that you have identified today? Not applicable       SHIPPING     Specialty Medication(s) to be Shipped:   Hematology/Oncology: Pegasys    Other medication(s) to be shipped:  Syringes     Changes to insurance: No    Delivery Scheduled: Yes, Expected medication delivery date: 02/12/23.     Medication will be delivered via Same Day Courier to the confirmed prescription address in [facility].    The patient will receive a drug information handout for each medication shipped and additional FDA Medication Guides as required.  Verified that patient has previously received a Conservation officer, historic buildings and a Surveyor, mining.    The patient or caregiver noted above participated in the development of this care plan and knows that they can request review of or adjustments to the care plan at any time.      All of the patient's questions and concerns have been addressed.    Orlondo Holycross Vangie Bicker, PharmD   [facility] Specialty Pharmacist

## 2023-02-12 DIAGNOSIS — D473 Essential (hemorrhagic) thrombocythemia: Principal | ICD-10-CM

## 2023-02-12 LAB — CBC W/ DIFFERENTIAL
BANDED NEUTROPHILS ABSOLUTE COUNT: 0 10*3/uL (ref 0.0–0.1)
BASOPHILS ABSOLUTE COUNT: 0.1 10*3/uL (ref 0.0–0.2)
BASOPHILS RELATIVE PERCENT: 1 %
EOSINOPHILS ABSOLUTE COUNT: 0.3 10*3/uL (ref 0.0–0.4)
EOSINOPHILS RELATIVE PERCENT: 4 %
HEMATOCRIT: 33.1 % — ABNORMAL LOW (ref 34.0–46.6)
HEMOGLOBIN: 10.6 g/dL — ABNORMAL LOW (ref 11.1–15.9)
IMMATURE GRANULOCYTES: 0 %
LYMPHOCYTES ABSOLUTE COUNT: 1.5 10*3/uL (ref 0.7–3.1)
LYMPHOCYTES RELATIVE PERCENT: 23 %
MEAN CORPUSCULAR HEMOGLOBIN CONC: 32 g/dL (ref 31.5–35.7)
MEAN CORPUSCULAR HEMOGLOBIN: 29.6 pg (ref 26.6–33.0)
MEAN CORPUSCULAR VOLUME: 93 fL (ref 79–97)
MONOCYTES ABSOLUTE COUNT: 0.7 10*3/uL (ref 0.1–0.9)
MONOCYTES RELATIVE PERCENT: 11 %
NEUTROPHILS ABSOLUTE COUNT: 4 10*3/uL (ref 1.4–7.0)
NEUTROPHILS RELATIVE PERCENT: 61 %
PLATELET COUNT: 464 10*3/uL — ABNORMAL HIGH (ref 150–450)
RED BLOOD CELL COUNT: 3.58 x10E6/uL — ABNORMAL LOW (ref 3.77–5.28)
RED CELL DISTRIBUTION WIDTH: 13.5 % (ref 11.7–15.4)
WHITE BLOOD CELL COUNT: 6.6 10*3/uL (ref 3.4–10.8)

## 2023-02-12 LAB — COMPREHENSIVE METABOLIC PANEL
ALBUMIN: 4 g/dL (ref 3.9–4.9)
ALKALINE PHOSPHATASE: 106 IU/L (ref 44–121)
ALT (SGPT): 16 IU/L (ref 0–32)
AST (SGOT): 22 IU/L (ref 0–40)
BILIRUBIN TOTAL (MG/DL) IN SER/PLAS: 0.2 mg/dL (ref 0.0–1.2)
BLOOD UREA NITROGEN: 20 mg/dL (ref 8–27)
BUN / CREAT RATIO: 21 (ref 12–28)
CALCIUM: 9.4 mg/dL (ref 8.7–10.3)
CHLORIDE: 107 mmol/L — ABNORMAL HIGH (ref 96–106)
CO2: 22 mmol/L (ref 20–29)
CREATININE: 0.95 mg/dL (ref 0.57–1.00)
EGFR: 65 mL/min/{1.73_m2}
GLOBULIN, TOTAL: 2.4 g/dL (ref 1.5–4.5)
GLUCOSE: 89 mg/dL (ref 70–99)
POTASSIUM: 5 mmol/L (ref 3.5–5.2)
SODIUM: 143 mmol/L (ref 134–144)
TOTAL PROTEIN: 6.4 g/dL (ref 6.0–8.5)

## 2023-02-13 NOTE — Unmapped (Signed)
Clinical Pharmacist Practitioner: [facility]    Christy Buchanan is a 68 y.o. 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

## 2023-02-15 DIAGNOSIS — D473 Essential (hemorrhagic) thrombocythemia: Principal | ICD-10-CM

## 2023-02-15 MED FILL — BD SAFETYGLIDE TUBERCULIN REGULAR BEVEL 1 ML 27 X 1/2" SYRINGE: 70 days supply | Qty: 10 | Fill #1

## 2023-02-15 MED FILL — PEGASYS 180 MCG/ML SUBCUTANEOUS SOLUTION: SUBCUTANEOUS | 28 days supply | Qty: 4 | Fill #1

## 2023-02-15 NOTE — Unmapped (Signed)
Christy Buchanan 's Pegasys shipment will be sent out as a result of receiving credit card for $6.15 copay.     I have reached out to the patient  at [phone]  and communicated the delivery change. We will reschedule the medication for the delivery date that the patient agreed upon.  We have confirmed the delivery date as 02/15/23 via same day courier

## 2023-02-16 MED ORDER — ATORVASTATIN 40 MG TABLET
ORAL_TABLET | Freq: Every day | ORAL | 1 refills | 0.00 days
Start: 2023-02-16 — End: ?

## 2023-02-17 DIAGNOSIS — Z853 Personal history of malignant neoplasm of breast: Principal | ICD-10-CM

## 2023-02-17 MED ORDER — ATORVASTATIN 40 MG TABLET
ORAL_TABLET | Freq: Every day | ORAL | 1 refills | 90.00 days
Start: 2023-02-17 — End: ?

## 2023-02-17 MED ORDER — RALOXIFENE 60 MG TABLET
ORAL_TABLET | Freq: Every day | ORAL | 3 refills | 90.00 days | Status: CP
Start: 2023-02-17 — End: ?

## 2023-02-23 DIAGNOSIS — Z853 Personal history of malignant neoplasm of breast: Principal | ICD-10-CM

## 2023-02-23 MED ORDER — RALOXIFENE 60 MG TABLET
ORAL_TABLET | Freq: Every day | ORAL | 3 refills | 90.00 days
Start: 2023-02-23 — End: ?

## 2023-02-24 DIAGNOSIS — Z853 Personal history of malignant neoplasm of breast: Principal | ICD-10-CM

## 2023-02-24 MED ORDER — RALOXIFENE 60 MG TABLET
ORAL_TABLET | Freq: Every day | ORAL | 3 refills | 90.00 days
Start: 2023-02-24 — End: ?

## 2023-02-24 NOTE — Unmapped (Signed)
Pharmacy has not received the prescription for the raloxifene 60 mg as of yet and is asking that the prescription be sent again.  [facility], [phone]

## 2023-03-01 DIAGNOSIS — D473 Essential (hemorrhagic) thrombocythemia: Principal | ICD-10-CM

## 2023-03-02 ENCOUNTER — Other Ambulatory Visit: Admit: 2023-03-02 | Discharge: 2023-03-03 | Payer: MEDICARE

## 2023-03-02 ENCOUNTER — Ambulatory Visit
Admit: 2023-03-02 | Discharge: 2023-03-03 | Payer: MEDICARE | Attending: Hematology & Oncology | Primary: Hematology & Oncology

## 2023-03-02 DIAGNOSIS — D471 Chronic myeloproliferative disease: Principal | ICD-10-CM

## 2023-03-02 DIAGNOSIS — M3500 Sicca syndrome, unspecified: Principal | ICD-10-CM

## 2023-03-02 DIAGNOSIS — D473 Essential (hemorrhagic) thrombocythemia: Principal | ICD-10-CM

## 2023-03-02 LAB — CBC W/ AUTO DIFF
BASOPHILS ABSOLUTE COUNT: 0.1 10*9/L (ref 0.0–0.1)
BASOPHILS RELATIVE PERCENT: 1 %
EOSINOPHILS ABSOLUTE COUNT: 0.2 10*9/L (ref 0.0–0.5)
EOSINOPHILS RELATIVE PERCENT: 3.1 %
HEMATOCRIT: 30.9 % — ABNORMAL LOW (ref 34.0–44.0)
HEMOGLOBIN: 10.4 g/dL — ABNORMAL LOW (ref 11.3–14.9)
LYMPHOCYTES ABSOLUTE COUNT: 1.3 10*9/L (ref 1.1–3.6)
LYMPHOCYTES RELATIVE PERCENT: 22.3 %
MEAN CORPUSCULAR HEMOGLOBIN CONC: 33.7 g/dL (ref 32.0–36.0)
MEAN CORPUSCULAR HEMOGLOBIN: 28.5 pg (ref 25.9–32.4)
MEAN CORPUSCULAR VOLUME: 84.7 fL (ref 77.6–95.7)
MEAN PLATELET VOLUME: 8.1 fL (ref 6.8–10.7)
MONOCYTES ABSOLUTE COUNT: 0.7 10*9/L (ref 0.3–0.8)
MONOCYTES RELATIVE PERCENT: 11.9 %
NEUTROPHILS ABSOLUTE COUNT: 3.5 10*9/L (ref 1.8–7.8)
NEUTROPHILS RELATIVE PERCENT: 61.7 %
PLATELET COUNT: 411 10*9/L (ref 150–450)
RED BLOOD CELL COUNT: 3.65 10*12/L — ABNORMAL LOW (ref 3.95–5.13)
RED CELL DISTRIBUTION WIDTH: 14.6 % (ref 12.2–15.2)
WBC ADJUSTED: 5.6 10*9/L (ref 3.6–11.2)

## 2023-03-02 LAB — COMPREHENSIVE METABOLIC PANEL
ALBUMIN: 3.6 g/dL (ref 3.4–5.0)
ALKALINE PHOSPHATASE: 101 U/L (ref 46–116)
ALT (SGPT): 13 U/L (ref 10–49)
ANION GAP: 10 mmol/L (ref 5–14)
AST (SGOT): 19 U/L (ref <=34)
BILIRUBIN TOTAL: 0.4 mg/dL (ref 0.3–1.2)
BLOOD UREA NITROGEN: 24 mg/dL — ABNORMAL HIGH (ref 9–23)
BUN / CREAT RATIO: 26
CALCIUM: 9.6 mg/dL (ref 8.7–10.4)
CHLORIDE: 106 mmol/L (ref 98–107)
CO2: 28 mmol/L (ref 20.0–31.0)
CREATININE: 0.92 mg/dL (ref 0.55–1.02)
EGFR CKD-EPI (2021) FEMALE: 68 mL/min/{1.73_m2} (ref >=60)
GLUCOSE RANDOM: 91 mg/dL (ref 70–179)
POTASSIUM: 4.4 mmol/L (ref 3.4–4.8)
PROTEIN TOTAL: 6.8 g/dL (ref 5.7–8.2)
SODIUM: 144 mmol/L (ref 135–145)

## 2023-03-02 LAB — T3, FREE: T3 FREE: 2.81 pg/mL (ref 2.30–4.20)

## 2023-03-02 LAB — LACTATE DEHYDROGENASE: LACTATE DEHYDROGENASE: 224 U/L (ref 120–246)

## 2023-03-02 LAB — TSH: THYROID STIMULATING HORMONE: 2.01 u[IU]/mL (ref 0.550–4.780)

## 2023-03-02 LAB — C-REACTIVE PROTEIN: C-REACTIVE PROTEIN: <4 mg/L (ref <=10.0)

## 2023-03-02 LAB — T4, FREE: FREE T4: 1.04 ng/dL (ref 0.89–1.76)

## 2023-03-02 MED ORDER — PEGASYS 180 MCG/ML SUBCUTANEOUS SOLUTION
SUBCUTANEOUS | 11 refills | 224.00 days | Status: CP
Start: 2023-03-02 — End: ?
  Filled 2023-04-22: qty 2, 28d supply, fill #0

## 2023-03-02 NOTE — Unmapped (Unsigned)
ID: Christy Buchanan is a 68 y.o. with a JAK2+ MPN     DZ CHAR: ET (JAK2+)  BM Bx (2018):  Increased atypical megakaryocytes with clustering  2% blasts  JAK2 mutation (VAF 3.5%)  Nl karyotype   Cellularity from 40 - 50%  Patchy mild increase in reticulin; no increase in collagen; no fibrosis  No monoclonal B or T cell populations  IPSET 2: High risk (age, JAK2)   CV Risk  LDL: 110  HgbA1c: 5.3  BP: Borderline  Tx:   Hydrea (DC'd due to inc MPN Sx  Ruxolitnib (DC'd due to anemia)  Momelotinib     ASSESSMENT:   Christy Buchanan is a 68 y.o. 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  At the time of her diagnosis, she had no circulating immature or CD34+ cells, no increase in fibrosis, a normal LDH, and no palpable splenomegaly.      She was transitioned from hydroxyurea to ruxolitinib due to increased MPN related sx.  This change reduced her MPN symptom score initially from 48 to 21.  Unfortunately, she developed anemia.  As a result, she was switched to momalotinib.  This did raise her hemoglobin, but she developed significant neuropathy,  Thus, she was switched to IFN.      Her JAK2 VAF is rather low.  I am hoping that the use of IFN can put her into a molecular remission.  If so, she may be able to come off of treatment altogether.  The change to IFN has made her MPN symptoms worse.  We can consider adding ruxolitinib back at a lower dose.      In the meantime, we are still dealing with anemia.  There is no indication of progression (e.g. inc in LDH, dysplastic circulating cells, splenomegaly, etc).  I can lower her IFN to see is this makes a difference.  I have a suspicion this is a function of her transplant.  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  She appears to have lost some of the gait improvement she had following surgery though this is complicated by her neuropathy.       PLAN:  1) Decrease IFN to every other week.   2) We talked about a number of  treatments for neuropathy including capsaicin patches, dry needling, Cymbalta, and Lyrica.  She is going to try the patches.   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  04/2016: Began Hydrea at 500 mg every day; increased to 1000 mg  05/2016: BM biopsy   Increased atypical megakaryocytes with clustering  No fibrosis, 2% blasts  JAK2 mutation (VAF 3.5%)  Nl karyotype   Cellularity from 40 - 50%  Patchy mild increase in reticulin; no increase in collagen  No monoclonal B or T cell populations    03/15/19: Seen at [facility]  CBC: 7.3/12.0/312  LDH: WNL  CD34: 0.02  VWF: Normal   MPN score: 48 to 49  Began ruxolitinib 10 mg BID    04/05/19  CBC: 7.3/12.0/312  MPN score: 36 - 38  Ruxolitinib 10 mg BID; Hydrea was stopped     05/17/19: Ruxolitinib 20 mg BID  LDH: 1.30 x ULN  MPN Score: 27    05/31/19: Ruxolitinib 15 mg BID (decrease due  to anemia of 10.6)    07/12/19: Ruxolitinib 10 mg BID (decrease due to anemia of 10.4, plts 477); added Hydrea 500 mg a day   CD34: 0.12  LDH: 1.42 x ULN  MPN Score: 32    10/25/19: Ruxolitinib 5 mg BID (continued anemia); Hydrea at 500 mg   LDH: 1.29 x ULN  MPN Score: 31    03/01/19:  Ruxolitinib 5 mg BID; Hydrea at 500 mg   LDH: 1.39 x ULN  MPN Score: 23    05/09/20:  Ruxolitinib 5mg  qAM and 10mg  qPM;  Stopped hydrea    06/12/20: Ruxolitinib 5mg  qAM and 10mg  qPM;  LDH 1.39 x ULN  MPN score: 34 but reports feeling better than last visit even with higher score.    08/29/20: 4.8/11.0/431  02/27/21: 6.2/11.4/477; Ruxolitinib 10 mg BID  MPN: 21  Xol: 159  LDH: 1.17 x ULN  07/13/22: Neuropsychometric assessment is c/w subcortical pattern of cognitive impairment, not consistent with Alzheimer's; c/w severe cerebrovascular disease and NPH  09/09/21: 5.4/10.7/495  12/2022: Shunt placed.   03/11/22: 8.8/11.9/659;   CD34: 13; B12 634; LDH 337;   HDL: 58; Non-HDL: 132  05/11/22: Momelotinib 200 mg QD  05/29/22: 4.6/10.2/555  HgbA1c: 5.3    08/24/22: 6.1/10.5/473; MPN Score: 24  12/08/22: 4.9/10.7/504; momelotinib DC'd due to peripheral neuropathy   Variants of Known/Likely Clinical Significance:   Gene Coding Predicted Protein Variant allele fraction   JAK2 c.1849G>T p.(Val617Phe) [V617F] 4.2%     12/24/22: Began interferon  03/02/23: 5.6/10.4/411  LDH: 224    INTERVAL HX:  Christy Buchanan comes for FU of her ET.  We talked about the following:  Neuropathy  This continues to be a problem  She is using heated socks  She had to stop   She has had worsening balance problems; her last NPH adjustment was a year ago.    She notes occ HA; these are likely tension type HA though we cannot rule out cervicogenic   She has had no infectious sx.   She is not having a cough or a flare of her Sjogren's sx    The rest of her ROS is given below.     MPN 10 Score  Symptom (Absent 0 to 10 worst imaginable) 2/21 2/21 4/21 6/21 9/21  1/22  5/22 1/23 8/23 1/24 4/24 7/24 10/24 03/02/23   Fatigue in the past 24 hours 6 9 7 9 7  8 6  5 5 5 5 3 5  6-7 IFN may make it worse    Filling up quickly when you eat (Early satiety)  7 7 5 5 5 5 4   0 5 5 2  2-3 2-3  Not hungry - 3 - 4    Abdominal discomfort  3 4 4 4 4 5  4  2   0 6 4 - 5 2   0 2   Inactivity 5 5 - 6 5 7 5 4  4  4 2  3 6  5 5 5    Problems with concentration - 6 7-8 6 7 6 2  5  3 6 6  6-7 4 6 6    Numbness/ Tingling (in my hands and feet) 5 to 6 0 0 0 0 0  0  0 0 0 0 3 7  9    Night sweats 0 0 1 0 0 0  1 0 4 2-3 0 0 0 2 Wearing heavy pants to bed    Itching (pruritus) 10 7 0 0 3  3   7 7 7 5 4 1  7 5    Bone pain (diffuse not joint pain or arthritis) 6 6 3  0 1 0  3  0 2 -3 0 4-5  3   0 3   Fever (>100 F) 0 0 0 0 0 0  0  0 0 0 0 0 0 0   Unintentional weight loss last 6 months 0 0 0 0 0 0  0  0 1 - 2 0 0 0 2 0   TOTAL 48 - 49 36 - 38 27 32 31 27   34 21 32 - 34 32-34 31 - 34 24 34-35 41     PHYSICAL EXAM:  VS: As recorded in Epic  GENERAL: She appears well   HEENT: OP is clear  LYMPH NODES: No LAN  LUNGS: CTA  COR: RRR  ABD: NTND; no HSM  JOA:CZYSAY are intact  NEURO:+ Romberg; somewhat ataxic gait  DERM:     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; PR pos, positive margin  L Mastectomy - no malignancy; neg LN  Txed with Surgery, radiation, auto BMT, raloxifene  Negative WU  for 28 genes associated with hereditary cancers  Raloxifene  Sjogren's syndrome  Autoimmune thyroiditis - Txed with methimazole   GERD - Txed with PPI  Mixed hyperlipidemia - Tx with Lovaza, pravastatin     FHx:  Daughter w/ spina bifida, BRCA 1081G>A mutation (w/o cancer)      2016 WHO Diagnostic Criteria for MPNs    ET  Major Criteria  Plts > 450  BM biopsy showing proliferation mainly of the megakaryocyte lineage with increased numbers of enlarged, mature megakaryocytes with hyperlobulated nuclei. No significant left-shift of neutrophil granulopoiesis or erythropoiesis and very rarely minor (grade 1) increase in reticulin fibers  Not meeting WHO criteria for BCR-ABL1?+?CML, PV, PMF, MDS, or other myeloid neoplasms)  Presence of JAK2, CALR or MPL mutation    Minor Criteria  Presence of a clonal marker (e.g., abnormal karyotype)  Absence of evidence for reactive thrombocytosis    Diagnosis: All major criteria or first three major and one minor     PMF  Major criteria  Proliferation and atypia of megakaryocytes accompanied by either reticulin and/or collagen fibrosis  grades 2 or 3 on a scale of 0 to 3  Not meeting WHO criteria for ET, PV, BCR-ABL1+ CML, myelodysplastic syndromes, or other myeloid neoplasm  Presence of JAK2, CALR or MPL mutation or in the absence of these mutations, presence of another clonal marker or absence of reactive myelofibrosis    Minor Criteria  Anemia not attributed to another comorbid condition  WBC > 11.0  Palpable Splenomegaly   LDH > ULN  Leukoerythroblastosis     Diagnosis: 3 major and at least 1 minor confirmed in two consecutive determinations    IPSET Model for Prediction of Thrombosis    Age:    2   < 60: 0   > 60: 2  WBC count (x10e9)  0   < 11: 0   > 11: 1  Hx of Thrombosis  0   No: 0   Yes: 1  --------------------------------------------  Total:    2     10 yr thrombosis risk, median survival (yrs)  Lo Risk: 0 pts    --> 89%, NR  Int Risk: 1-2 pts --> 86%, 24.5  Hi Risk: 3-4 pts  --> 69%, 14.7    IPSET II  Age:    1   < 60: 0   > 60: 1  CVS Risk Factor*  0   No: 0   Yes: 1   Hx of Thrombosis  0   No: 0   Yes: 2  JAK2     2   No: 0   Yes: 2  --------------------------------------------  Total:    3    *Gave up smoking 10 years ago, near optimal non-LDL on treatment, non HTN    Annual Thrombosis Risk  Lo Risk: 0-1 pts  --> 1.03%  Int Risk: 2 pts     --> 2.35%  Hi Risk: 3-6 pts  --> 3.56%     NOTE  JAK2 conveys a higher risk of thrombosis  CALR conveys a higher risk of bleeding    Newer Model:  [email or web address]    Primary prophylaxis of thrombosis  All patients: Aspirin unless    VWF activity <30%,   Platelet >?1 million,   CALR-mutated low-risk ET    PV with Hct >?45%: Add phlebotomy/cytoreduction to target Hct <?45%  Age >?60 years and/or prior history of thrombosis: Add cytoreduction  Secondary prophylaxis after thrombotic event  All patients: Cytoreduction  Typical VTE: Consider indefinite VKA for most patients; aspirin if not on VKA  Atypical VTE: Indefinite VKA  Arterial thrombosis: Aspirin Risk  Lo Risk: 0-1 pts  --> 1.03%  Int Risk: 2 pts     --> 2.35%  Hi Risk: 3-6 pts  --> 3.56%     NOTE  JAK2 conveys a higher risk of thrombosis  CALR conveys a higher risk of bleeding    Newer Model:  [email or web address]    Primary prophylaxis of thrombosis  All patients: Aspirin unless    VWF activity <30%,   Platelet >?1 million,   CALR-mutated low-risk ET    PV with Hct >?45%: Add phlebotomy/cytoreduction to target Hct <?45%  Age >?60 years and/or prior history of thrombosis: Add cytoreduction  Secondary prophylaxis after thrombotic event  All patients: Cytoreduction  Typical VTE: Consider indefinite VKA for most patients; aspirin if not on VKA  Atypical VTE: Indefinite VKA  Arterial thrombosis: Aspirin    {    Coding tips - Do not edit this text, it will delete upon signing of note!    Telephone visits [phone] for Physicians and APPs and [phone] for Non- Physician Clinicians)- Only use minutes on the phone to determine level of service.    Video visits [phone]) - Use either level of medical decision making just as an in-person visit OR time which includes both minutes on video and pre/post minutes to determine the level of service.      :[id number]}  The patient reports they are physically located in [place] and is currently: at home. I conducted a audio/video visit. I spent  0s on the video call with the patient. I spent an additional 15 minutes on pre- and post-visit activities on the date of service .

## 2023-03-02 NOTE — Unmapped (Addendum)
PLAN:  1) Try the patch. Let us know if you want to try something else.    2) Decrease IFN to every other week   3) Labs in 6 weeks.   4) Return in 3 months.      Decrease interferon to every other week. You need some system to remind you.     We can do labs less often. (6 weeks and RTC in 3 months)    Neuropathy:   - Cymbalta: I would have to talk to pharmacist about switching   - Lyrica   - Capsaicin: This the hot pepper chemical.  It increases substance p.  It takes time. There is a patch.  You have to stop after 5 days.     - Dry needling: You may need a referral for this.    - Compounded lotion:     All lab results last 24 hours:    Recent Results (from the past 24 hours)   C-reactive protein    Collection Time: 03/02/23 12:19 PM   Result Value Ref Range    CRP <4.0 <=10.0 mg/L   TSH    Collection Time: 03/02/23 12:19 PM   Result Value Ref Range    TSH 2.010 0.550 - 4.780 uIU/mL Your thyroid function is normal    T4, Free    Collection Time: 03/02/23 12:19 PM   Result Value Ref Range    Free T4 1.04 0.89 - 1.76 ng/dL   T3, Free    Collection Time: 03/02/23 12:19 PM   Result Value Ref Range    T3, Free 2.81 2.30 - 4.20 pg/mL   Comprehensive Metabolic Panel    Collection Time: 03/02/23 12:19 PM   Result Value Ref Range    Sodium 144 135 - 145 mmol/L    Potassium 4.4 3.4 - 4.8 mmol/L    Chloride 106 98 - 107 mmol/L    CO2 28.0 20.0 - 31.0 mmol/L    Anion Gap 10 5 - 14 mmol/L    BUN 24 (H) 9 - 23 mg/dL    Creatinine 1.61 0.96 - 1.02 mg/dL    BUN/Creatinine Ratio 26     eGFR CKD-EPI (2021) Female 68 >=60 mL/min/1.29m2    Glucose 91 70 - 179 mg/dL    Calcium 9.6 8.7 - 04.5 mg/dL    Albumin 3.6 3.4 - 5.0 g/dL    Total Protein 6.8 5.7 - 8.2 g/dL    Total Bilirubin 0.4 0.3 - 1.2 mg/dL    AST 19 <=40 U/L    ALT 13 10 - 49 U/L    Alkaline Phosphatase 101 46 - 116 U/L   Lactate dehydrogenase    Collection Time: 03/02/23 12:19 PM   Result Value Ref Range    LDH 224 120 - 246 U/L This is normal for the first time.  This is the cell death number    CBC w/ Differential    Collection Time: 03/02/23 12:19 PM   Result Value Ref Range    WBC 5.6 3.6 - 11.2 10*9/L    RBC 3.65 (L) 3.95 - 5.13 10*12/L    HGB 10.4 (L) 11.3 - 14.9 g/dL    HCT 98.1 (L) 19.1 - 44.0 % This is interferon - the goal is < 42.5    MCV 84.7 77.6 - 95.7 fL    MCH 28.5 25.9 - 32.4 pg    MCHC 33.7 32.0 - 36.0 g/dL    RDW 47.8 29.5 - 62.1 %  MPV 8.1 6.8 - 10.7 fL    Platelet 411 150 - 450 10*9/L Goal is < 600    Neutrophils % 61.7 %    Lymphocytes % 22.3 %    Monocytes % 11.9 %    Eosinophils % 3.1 %    Basophils % 1.0 %    Absolute Neutrophils 3.5 1.8 - 7.8 10*9/L    Absolute Lymphocytes 1.3 1.1 - 3.6 10*9/L    Absolute Monocytes 0.7 0.3 - 0.8 10*9/L    Absolute Eosinophils 0.2 0.0 - 0.5 10*9/L    Absolute Basophils 0.1 0.0 - 0.1 10*9/L

## 2023-03-04 LAB — SJOGREN'S SS-B/LA ANTIBODY, IGG: ANTI-SSB AB: NEGATIVE

## 2023-03-04 LAB — SJOGREN'S SS-A/RO ANTIBODY, IGG: ANTI-SSA AB: NEGATIVE

## 2023-03-04 NOTE — Unmapped (Signed)
Clinical Assessment Needed For: Dose Change  Medication: PEGASYS 180 mcg/mL injection (peginterferon alfa-2a)  Last Fill Date/Day Supply: 4ml / 28 days  Copay $0.00  Was previous dose already scheduled to fill: No    Notes to Pharmacist: none

## 2023-03-05 NOTE — Unmapped (Signed)
Ms Bal continues to be mildly anemic.  Her MPN symptoms are back, which is no surprise since she is off a JAK2 inhibitor.  Since her counts are otherwise quite good, I had her decrease her interferon to every other week.      I would like her to labs in about 6 weeks.  I have put them in.  Her memory is poor so she may need a reminder.  I will let you decide if she needs a video visit.  Otherwise, I will see her in 3 months.      PLAN:  1) Decrease IFN to every other week.   2) We talked about a number of  treatments for neuropathy including capsaicin patches, dry needling, Cymbalta, and Lyrica.  She is going to try the patches.   3) Labs in 6 weeks: ferritin, CD34+, B1, B6, retic, haptoglobin, CBC, CMP  4) RTC in 3 months

## 2023-03-08 NOTE — Unmapped (Signed)
[facility] Specialty and Home Delivery Pharmacy Refill Coordination Note    Christy Buchanan, DOB: 12/15/1955  Phone: [phone] (home)       All above HIPAA information was verified with patient.         03/08/2023    10:47 AM   Specialty Rx Medication Refill Questionnaire   Which Medications would you like refilled and shipped? I have three vials of Pegasys on hand.  Recent change in medication from one time injection per week to every two weeks.   Please list all current allergies: NKA   Have you missed any doses in the last 30 days? No   Have you had any changes to your medication(s) since your last refill? Yes   Please list your medication(s) changes below. PEGASYS 180 mcg/mL injection Generic drug: peginterferon alfa-2a Changed by: Launa Flight, MD Inject 0.25 mL (45 mcg total) under the skin every fourteen (14) days. Discard remainder of vial after injection   How many days remaining of each medication do you have at home? Three vials of 180 mcg/ml  I have enough medication for 03/11/22 injection; 03/25/22 injection and 04/08/22 injection   If receiving an injectable medication, next injection date is 03/11/2022   Have you experienced any side effects in the last 30 days? No   Please enter the full address (street address, city, state, zip code) where you would like your medication(s) to be delivered to. [address], [place], [place] [address]   Please specify on which day you would like your medication(s) to arrive. Note: if you need your medication(s) within 3 days, please call the pharmacy to schedule your order at [phone]  04/12/2022   Has your insurance changed since your last refill? No   Would you like a pharmacist to call you to discuss your medication(s)? No   Do you require a signature for your package? (Note: if we are billing Medicare Part B or your order contains a controlled substance, we will require a signature) No         Completed refill call assessment today to schedule patient's medication shipment from the [facility] [phone]).  All relevant notes have been reviewed.       Confirmed patient received a Conservation officer, historic buildings and a Surveyor, mining with first shipment. The patient will receive a drug information handout for each medication shipped and additional FDA Medication Guides as required.         REFERRAL TO PHARMACIST     Referral to the pharmacist: Not needed      [facility]     Shipping address confirmed in Epic.     Delivery Scheduled: Patient declined refill at this time due to change in sig and has enough to March now..     Medication will be delivered via  n/a  to the  n/a  address in Epic [place].    Minda Faas M Elisabeth Cara   [facility] Specialty and Home Delivery Pharmacy Specialty Technician

## 2023-03-11 NOTE — Unmapped (Signed)
pt unavailable lvm placed recall

## 2023-03-15 DIAGNOSIS — D473 Essential (hemorrhagic) thrombocythemia: Principal | ICD-10-CM

## 2023-03-26 NOTE — Unmapped (Signed)
 Copied from CRM #[id number]. Topic: Access To Clinicians - Medication Refill  >> Mar 26, 2023 10:00 AM Elvis Coil wrote:  The caller reports that they have previously requested refill from pharmacy, but have not received authorization yet.     Shanda Bumps, T With [facility] is requesting the following:  *90 days supply with 3 refills*   Medication(s) for refill:   omeprazole (PRILOSEC) 20 MG capsule [[id number]]     raloxifene (EVISTA) 60 mg tablet [[id number]]    atorvastatin (LIPITOR) 80 MG tablet [[id number]     Quantity for 3 month supply    Pharmacy name and address: [address]  [place], [place] [address]   [id number]  f: [phone]    Please contact anyone from office by [phone] in regards to this request, if need be.    Coverage: yes, coverage is accurate on file.    Medication request callback turnaround time: 72 business hours. Programmer, systems Notified)

## 2023-03-29 DIAGNOSIS — D473 Essential (hemorrhagic) thrombocythemia: Principal | ICD-10-CM

## 2023-03-29 MED ORDER — RALOXIFENE 60 MG TABLET
ORAL_TABLET | Freq: Every day | ORAL | 0 refills | 90.00 days | Status: CP
Start: 2023-03-29 — End: ?

## 2023-03-29 MED ORDER — ATORVASTATIN 80 MG TABLET
ORAL_TABLET | Freq: Every day | ORAL | 0 refills | 90 days | Status: CP
Start: 2023-03-29 — End: 2023-06-27

## 2023-03-29 MED ORDER — OMEPRAZOLE 20 MG CAPSULE,DELAYED RELEASE
ORAL_CAPSULE | Freq: Every day | ORAL | 0 refills | 90.00 days | Status: CP
Start: 2023-03-29 — End: ?

## 2023-03-29 NOTE — Unmapped (Signed)
 Addended by: Rudell Cobb D on: 03/29/2023 10:12 AM     Modules accepted: Orders

## 2023-04-01 DIAGNOSIS — F325 Major depressive disorder, single episode, in full remission: Principal | ICD-10-CM

## 2023-04-01 MED ORDER — CITALOPRAM 10 MG TABLET
ORAL_TABLET | Freq: Three times a day (TID) | ORAL | 1 refills | 30.00 days | Status: CP
Start: 2023-04-01 — End: ?

## 2023-04-01 MED ORDER — ATORVASTATIN 80 MG TABLET
ORAL_TABLET | Freq: Every day | ORAL | 1 refills | 90 days | Status: CP
Start: 2023-04-01 — End: 2023-06-30

## 2023-04-07 DIAGNOSIS — K219 Gastro-esophageal reflux disease without esophagitis: Principal | ICD-10-CM

## 2023-04-07 MED ORDER — OMEPRAZOLE 20 MG CAPSULE,DELAYED RELEASE
ORAL_CAPSULE | Freq: Every day | ORAL | 1 refills | 90.00 days | Status: CP
Start: 2023-04-07 — End: ?

## 2023-04-12 DIAGNOSIS — D473 Essential (hemorrhagic) thrombocythemia: Principal | ICD-10-CM

## 2023-04-13 DIAGNOSIS — D473 Essential (hemorrhagic) thrombocythemia: Principal | ICD-10-CM

## 2023-04-13 DIAGNOSIS — D471 Chronic myeloproliferative disease: Principal | ICD-10-CM

## 2023-04-13 DIAGNOSIS — R768 Other specified abnormal immunological findings in serum: Principal | ICD-10-CM

## 2023-04-13 LAB — COMPREHENSIVE METABOLIC PANEL
ALBUMIN: 4.4 g/dL (ref 3.9–4.9)
ALKALINE PHOSPHATASE: 135 IU/L — ABNORMAL HIGH (ref 44–121)
ALT (SGPT): 18 IU/L (ref 0–32)
AST (SGOT): 18 IU/L (ref 0–40)
BILIRUBIN TOTAL (MG/DL) IN SER/PLAS: 0.3 mg/dL (ref 0.0–1.2)
BLOOD UREA NITROGEN: 16 mg/dL (ref 8–27)
BUN / CREAT RATIO: 15 (ref 12–28)
CALCIUM: 10 mg/dL (ref 8.7–10.3)
CHLORIDE: 103 mmol/L (ref 96–106)
CO2: 24 mmol/L (ref 20–29)
CREATININE: 1.1 mg/dL — ABNORMAL HIGH (ref 0.57–1.00)
EGFR: 54 mL/min/{1.73_m2} — ABNORMAL LOW
GLOBULIN, TOTAL: 2.5 g/dL (ref 1.5–4.5)
GLUCOSE: 95 mg/dL (ref 70–99)
POTASSIUM: 5 mmol/L (ref 3.5–5.2)
SODIUM: 139 mmol/L (ref 134–144)
TOTAL PROTEIN: 6.9 g/dL (ref 6.0–8.5)

## 2023-04-13 LAB — CBC W/ DIFFERENTIAL
BANDED NEUTROPHILS ABSOLUTE COUNT: 0 10*3/uL (ref 0.0–0.1)
BASOPHILS ABSOLUTE COUNT: 0.1 10*3/uL (ref 0.0–0.2)
BASOPHILS RELATIVE PERCENT: 1 %
EOSINOPHILS ABSOLUTE COUNT: 0.2 10*3/uL (ref 0.0–0.4)
EOSINOPHILS RELATIVE PERCENT: 2 %
HEMATOCRIT: 36.5 % (ref 34.0–46.6)
HEMOGLOBIN: 11.9 g/dL (ref 11.1–15.9)
IMMATURE GRANULOCYTES: 0 %
LYMPHOCYTES ABSOLUTE COUNT: 1.7 10*3/uL (ref 0.7–3.1)
LYMPHOCYTES RELATIVE PERCENT: 23 %
MEAN CORPUSCULAR HEMOGLOBIN CONC: 32.6 g/dL (ref 31.5–35.7)
MEAN CORPUSCULAR HEMOGLOBIN: 28 pg (ref 26.6–33.0)
MEAN CORPUSCULAR VOLUME: 86 fL (ref 79–97)
MONOCYTES ABSOLUTE COUNT: 1.1 10*3/uL — ABNORMAL HIGH (ref 0.1–0.9)
MONOCYTES RELATIVE PERCENT: 15 %
NEUTROPHILS ABSOLUTE COUNT: 4.3 10*3/uL (ref 1.4–7.0)
NEUTROPHILS RELATIVE PERCENT: 59 %
PLATELET COUNT: 488 10*3/uL — ABNORMAL HIGH (ref 150–450)
RED BLOOD CELL COUNT: 4.25 x10E6/uL (ref 3.77–5.28)
RED CELL DISTRIBUTION WIDTH: 14 % (ref 11.7–15.4)
WHITE BLOOD CELL COUNT: 7.4 10*3/uL (ref 3.4–10.8)

## 2023-04-13 NOTE — Unmapped (Signed)
 [facility] Specialty and Home Delivery Pharmacy Refill Coordination Note    Christy Buchanan, DOB: 24-Jun-1955  Phone: [phone] (home)       All above HIPAA information was verified with patient.         03/31/2023    11:49 AM   Specialty Rx Medication Refill Questionnaire   Which Medications would you like refilled and shipped? Pegasys  162mcg/L  Injection, I have one more box for the injection for next Friday February 28th   If medication refills are not needed at this time, please indicate the reason below. I have what I need   Please list all current allergies: None noted   Have you missed any doses in the last 30 days? No   Have you had any changes to your medication(s) since your last refill? No   How many days remaining of each medication do you have at home? one more injection for February 28th   If receiving an injectable medication, next injection date is 04/09/2023   Have you experienced any side effects in the last 30 days? No   Please enter the full address (street address, city, state, zip code) where you would like your medication(s) to be delivered to. [address], [place], [place] [address]   Please specify on which day you would like your medication(s) to arrive. Note: if you need your medication(s) within 3 days, please call the pharmacy to schedule your order at [phone]  04/22/2023   Has your insurance changed since your last refill? No   Would you like a pharmacist to call you to discuss your medication(s)? No   Do you require a signature for your package? (Note: if we are billing Medicare Part B or your order contains a controlled substance, we will require a signature) No   Additional Comments: none         Completed refill call assessment today to schedule patient's medication shipment from the [facility] Delivery Pharm[facility] [phone]).  All relevant notes have been reviewed.       Confirmed patient received a Conservation officer, historic buildings and a Surveyor, mining with first shipment. The patient will receive a drug information handout for each medication shipped and additional FDA Medication Guides as required.         REFERRAL TO PHARMACIST     Referral to the pharmacist: Not needed      [facility]     Shipping address confirmed in Epic.     Delivery Scheduled: Yes, Expected medication delivery date: 04/22/23.     Medication will be delivered via Same Day Courier to the prescription address in Epic [place].    Christy Buchanan   [facility] Specialty and Home Delivery Pharmacy Specialty Technician

## 2023-04-24 LAB — CBC W/ DIFFERENTIAL
BANDED NEUTROPHILS ABSOLUTE COUNT: 0 10*3/uL (ref 0.0–0.1)
BASOPHILS ABSOLUTE COUNT: 0.1 10*3/uL (ref 0.0–0.2)
BASOPHILS RELATIVE PERCENT: 1 %
EOSINOPHILS ABSOLUTE COUNT: 0.1 10*3/uL (ref 0.0–0.4)
EOSINOPHILS RELATIVE PERCENT: 2 %
HEMATOCRIT: 33 % — ABNORMAL LOW (ref 34.0–46.6)
HEMOGLOBIN: 10.7 g/dL — ABNORMAL LOW (ref 11.1–15.9)
IMMATURE GRANULOCYTES: 0 %
LYMPHOCYTES ABSOLUTE COUNT: 1.3 10*3/uL (ref 0.7–3.1)
LYMPHOCYTES RELATIVE PERCENT: 22 %
MEAN CORPUSCULAR HEMOGLOBIN CONC: 32.4 g/dL (ref 31.5–35.7)
MEAN CORPUSCULAR HEMOGLOBIN: 27.9 pg (ref 26.6–33.0)
MEAN CORPUSCULAR VOLUME: 86 fL (ref 79–97)
MONOCYTES ABSOLUTE COUNT: 0.6 10*3/uL (ref 0.1–0.9)
MONOCYTES RELATIVE PERCENT: 11 %
NEUTROPHILS ABSOLUTE COUNT: 3.8 10*3/uL (ref 1.4–7.0)
NEUTROPHILS RELATIVE PERCENT: 64 %
PLATELET COUNT: 473 10*3/uL — ABNORMAL HIGH (ref 150–450)
RED BLOOD CELL COUNT: 3.84 x10E6/uL (ref 3.77–5.28)
RED CELL DISTRIBUTION WIDTH: 14.3 % (ref 11.7–15.4)
WHITE BLOOD CELL COUNT: 5.9 10*3/uL (ref 3.4–10.8)

## 2023-04-24 LAB — COMPREHENSIVE METABOLIC PANEL
ALBUMIN: 4.1 g/dL (ref 3.9–4.9)
ALKALINE PHOSPHATASE: 134 IU/L — ABNORMAL HIGH (ref 44–121)
ALT (SGPT): 14 IU/L (ref 0–32)
AST (SGOT): 18 IU/L (ref 0–40)
BILIRUBIN TOTAL (MG/DL) IN SER/PLAS: 0.3 mg/dL (ref 0.0–1.2)
BLOOD UREA NITROGEN: 18 mg/dL (ref 8–27)
BUN / CREAT RATIO: 15 (ref 12–28)
CALCIUM: 9.6 mg/dL (ref 8.7–10.3)
CHLORIDE: 103 mmol/L (ref 96–106)
CO2: 22 mmol/L (ref 20–29)
CREATININE: 1.22 mg/dL — ABNORMAL HIGH (ref 0.57–1.00)
EGFR: 48 mL/min/{1.73_m2} — ABNORMAL LOW
GLOBULIN, TOTAL: 2.5 g/dL (ref 1.5–4.5)
GLUCOSE: 85 mg/dL (ref 70–99)
POTASSIUM: 5 mmol/L (ref 3.5–5.2)
SODIUM: 138 mmol/L (ref 134–144)
TOTAL PROTEIN: 6.6 g/dL (ref 6.0–8.5)

## 2023-04-24 LAB — RETICULOCYTES: RETICULOCYTE COUNT PCT: 1.3 % (ref 0.6–2.6)

## 2023-04-24 LAB — LACTATE DEHYDROGENASE: LACTATE DEHYDROGENASE: 241 IU/L — ABNORMAL HIGH (ref 119–226)

## 2023-04-24 LAB — HAPTOGLOBIN: HAPTOGLOBIN: 167 mg/dL (ref 37–355)

## 2023-04-24 LAB — FERRITIN: FERRITIN: 31 ng/mL (ref 15–150)

## 2023-04-26 DIAGNOSIS — D473 Essential (hemorrhagic) thrombocythemia: Principal | ICD-10-CM

## 2023-04-27 ENCOUNTER — Ambulatory Visit: Admit: 2023-04-27 | Discharge: 2023-04-28 | Payer: MEDICARE

## 2023-04-27 DIAGNOSIS — G912 (Idiopathic) normal pressure hydrocephalus: Principal | ICD-10-CM

## 2023-04-27 LAB — VITAMIN B6: VITAMIN B6: 6.7 ug/L (ref 3.4–65.2)

## 2023-04-27 NOTE — Unmapped (Signed)
 I changed your shunt setting to 110 today. Let me know how you are doing in about 2 weeks
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
## 2023-05-10 DIAGNOSIS — D473 Essential (hemorrhagic) thrombocythemia: Principal | ICD-10-CM

## 2023-05-13 LAB — COMPREHENSIVE METABOLIC PANEL
ALBUMIN: 4.1 g/dL (ref 3.9–4.9)
ALKALINE PHOSPHATASE: 126 IU/L — ABNORMAL HIGH (ref 44–121)
ALT (SGPT): 19 IU/L (ref 0–32)
AST (SGOT): 20 IU/L (ref 0–40)
BILIRUBIN TOTAL (MG/DL) IN SER/PLAS: 0.5 mg/dL (ref 0.0–1.2)
BLOOD UREA NITROGEN: 19 mg/dL (ref 8–27)
BUN / CREAT RATIO: 22 (ref 12–28)
CALCIUM: 9.4 mg/dL (ref 8.7–10.3)
CHLORIDE: 104 mmol/L (ref 96–106)
CO2: 21 mmol/L (ref 20–29)
CREATININE: 0.88 mg/dL (ref 0.57–1.00)
EGFR: 71 mL/min/{1.73_m2}
GLOBULIN, TOTAL: 2.7 g/dL (ref 1.5–4.5)
GLUCOSE: 92 mg/dL (ref 70–99)
POTASSIUM: 4.3 mmol/L (ref 3.5–5.2)
SODIUM: 139 mmol/L (ref 134–144)
TOTAL PROTEIN: 6.8 g/dL (ref 6.0–8.5)

## 2023-05-13 LAB — CBC W/ DIFFERENTIAL
BANDED NEUTROPHILS ABSOLUTE COUNT: 0 10*3/uL (ref 0.0–0.1)
BASOPHILS ABSOLUTE COUNT: 0.1 10*3/uL (ref 0.0–0.2)
BASOPHILS RELATIVE PERCENT: 1 %
EOSINOPHILS ABSOLUTE COUNT: 0.1 10*3/uL (ref 0.0–0.4)
EOSINOPHILS RELATIVE PERCENT: 3 %
HEMATOCRIT: 30.3 % — ABNORMAL LOW (ref 34.0–46.6)
HEMOGLOBIN: 10.2 g/dL — ABNORMAL LOW (ref 11.1–15.9)
IMMATURE GRANULOCYTES: 0 %
LYMPHOCYTES ABSOLUTE COUNT: 1.3 10*3/uL (ref 0.7–3.1)
LYMPHOCYTES RELATIVE PERCENT: 26 %
MEAN CORPUSCULAR HEMOGLOBIN CONC: 33.7 g/dL (ref 31.5–35.7)
MEAN CORPUSCULAR HEMOGLOBIN: 27.5 pg (ref 26.6–33.0)
MEAN CORPUSCULAR VOLUME: 82 fL (ref 79–97)
MONOCYTES ABSOLUTE COUNT: 0.6 10*3/uL (ref 0.1–0.9)
MONOCYTES RELATIVE PERCENT: 12 %
NEUTROPHILS ABSOLUTE COUNT: 2.9 10*3/uL (ref 1.4–7.0)
NEUTROPHILS RELATIVE PERCENT: 58 %
PLATELET COUNT: 459 10*3/uL — ABNORMAL HIGH (ref 150–450)
RED BLOOD CELL COUNT: 3.71 x10E6/uL — ABNORMAL LOW (ref 3.77–5.28)
RED CELL DISTRIBUTION WIDTH: 15 % (ref 11.7–15.4)
WHITE BLOOD CELL COUNT: 5 10*3/uL (ref 3.4–10.8)

## 2023-05-18 NOTE — Unmapped (Signed)
 [facility] Specialty and Home Delivery Pharmacy Refill Coordination Note    Christy Buchanan, DOB: 24-Mar-1955  Phone: [phone] (home)       All above HIPAA information was verified with patient.         05/18/2023     9:49 AM   Specialty Rx Medication Refill Questionnaire   Which Medications would you like refilled and shipped? PEGASYS 180 mcg/mL injection (peginterferon alfa-2a).   Please list all current allergies: nka   Have you missed any doses in the last 30 days? No   Have you had any changes to your medication(s) since your last refill? No   How many days remaining of each medication do you have at home? NONE   If receiving an injectable medication, next injection date is 05/21/2023   Have you experienced any side effects in the last 30 days? No   Please enter the full address (street address, city, state, zip code) where you would like your medication(s) to be delivered to. [address], [place], [place] [address]   Please specify on which day you would like your medication(s) to arrive. Note: if you need your medication(s) within 3 days, please call the pharmacy to schedule your order at [phone]  05/27/2023   Has your insurance changed since your last refill? No   Would you like a pharmacist to call you to discuss your medication(s)? No   Do you require a signature for your package? (Note: if we are billing Medicare Part B or your order contains a controlled substance, we will require a signature) No   I have been provided my out of pocket cost for my medication and approve the pharmacy to charge the amount to my credit card on file. Yes         Completed refill call assessment today to schedule patient's medication shipment from the [facility] [phone]).  All relevant notes have been reviewed.       Confirmed patient received a Conservation officer, historic buildings and a Surveyor, mining with first shipment. The patient will receive a drug information handout for each medication shipped and additional FDA Medication Guides as required.         REFERRAL TO PHARMACIST     Referral to the pharmacist: Not needed      [facility]     Shipping address confirmed in Epic.     Delivery Scheduled: Yes, Expected medication delivery date: 05/20/23.     Medication will be delivered via Same Day Courier to the prescription address in Epic WAM.    Christy Buchanan   [facility] Specialty and Home Delivery Pharmacy Specialty Technician

## 2023-05-20 MED FILL — PEGASYS 180 MCG/ML SUBCUTANEOUS SOLUTION: SUBCUTANEOUS | 28 days supply | Qty: 2 | Fill #1

## 2023-05-24 DIAGNOSIS — D473 Essential (hemorrhagic) thrombocythemia: Principal | ICD-10-CM

## 2023-05-25 ENCOUNTER — Other Ambulatory Visit: Admit: 2023-05-25 | Discharge: 2023-05-25 | Payer: MEDICARE

## 2023-05-25 ENCOUNTER — Ambulatory Visit
Admit: 2023-05-25 | Discharge: 2023-05-25 | Payer: MEDICARE | Attending: Hematology & Oncology | Primary: Hematology & Oncology

## 2023-05-25 DIAGNOSIS — Z853 Personal history of malignant neoplasm of breast: Principal | ICD-10-CM

## 2023-05-25 DIAGNOSIS — E611 Iron deficiency: Principal | ICD-10-CM

## 2023-05-25 DIAGNOSIS — D471 Chronic myeloproliferative disease: Principal | ICD-10-CM

## 2023-05-25 DIAGNOSIS — D473 Essential (hemorrhagic) thrombocythemia: Principal | ICD-10-CM

## 2023-05-25 LAB — RETICULOCYTES
RETICULOCYTE ABSOLUTE COUNT: 36.3 10*9/L (ref 23.0–100.0)
RETICULOCYTE COUNT PCT: 0.93 % (ref 0.50–2.17)

## 2023-05-25 LAB — COMPREHENSIVE METABOLIC PANEL
ALBUMIN: 3.8 g/dL (ref 3.4–5.0)
ALKALINE PHOSPHATASE: 119 U/L — ABNORMAL HIGH (ref 46–116)
ALT (SGPT): 21 U/L (ref 10–49)
ANION GAP: 10 mmol/L (ref 5–14)
AST (SGOT): 25 U/L (ref <=34)
BILIRUBIN TOTAL: 0.5 mg/dL (ref 0.3–1.2)
BLOOD UREA NITROGEN: 15 mg/dL (ref 9–23)
BUN / CREAT RATIO: 16
CALCIUM: 9.7 mg/dL (ref 8.7–10.4)
CHLORIDE: 107 mmol/L (ref 98–107)
CO2: 26 mmol/L (ref 20.0–31.0)
CREATININE: 0.95 mg/dL (ref 0.55–1.02)
EGFR CKD-EPI (2021) FEMALE: 65 mL/min/1.73m2 (ref >=60)
GLUCOSE RANDOM: 101 mg/dL (ref 70–179)
POTASSIUM: 4 mmol/L (ref 3.5–5.1)
PROTEIN TOTAL: 6.8 g/dL (ref 5.7–8.2)
SODIUM: 143 mmol/L (ref 135–145)

## 2023-05-25 LAB — CBC W/ AUTO DIFF
BASOPHILS ABSOLUTE COUNT: 0.1 10*9/L (ref 0.0–0.1)
BASOPHILS RELATIVE PERCENT: 1.3 %
EOSINOPHILS ABSOLUTE COUNT: 0.1 10*9/L (ref 0.0–0.5)
EOSINOPHILS RELATIVE PERCENT: 2.3 %
HEMATOCRIT: 31.1 % — ABNORMAL LOW (ref 34.0–44.0)
HEMOGLOBIN: 10.5 g/dL — ABNORMAL LOW (ref 11.3–14.9)
LYMPHOCYTES ABSOLUTE COUNT: 1.2 10*9/L (ref 1.1–3.6)
LYMPHOCYTES RELATIVE PERCENT: 24.3 %
MEAN CORPUSCULAR HEMOGLOBIN CONC: 33.7 g/dL (ref 32.0–36.0)
MEAN CORPUSCULAR HEMOGLOBIN: 26.9 pg (ref 25.9–32.4)
MEAN CORPUSCULAR VOLUME: 79.7 fL (ref 77.6–95.7)
MEAN PLATELET VOLUME: 8 fL (ref 6.8–10.7)
MONOCYTES ABSOLUTE COUNT: 0.7 10*9/L (ref 0.3–0.8)
MONOCYTES RELATIVE PERCENT: 13.9 %
NEUTROPHILS ABSOLUTE COUNT: 2.8 10*9/L (ref 1.8–7.8)
NEUTROPHILS RELATIVE PERCENT: 58.2 %
PLATELET COUNT: 400 10*9/L (ref 150–450)
RED BLOOD CELL COUNT: 3.9 10*12/L — ABNORMAL LOW (ref 3.95–5.13)
RED CELL DISTRIBUTION WIDTH: 16 % — ABNORMAL HIGH (ref 12.2–15.2)
WBC ADJUSTED: 4.9 10*9/L (ref 3.6–11.2)

## 2023-05-25 LAB — CD34 ENUMERATION
CD34*: 0.06 %
CD45*: 100 %
PRE-APHERESIS 34: 3 {cells}/uL

## 2023-05-25 LAB — HAPTOGLOBIN: HAPTOGLOBIN: 105 mg/dL (ref 30–200)

## 2023-05-25 LAB — FERRITIN: FERRITIN: 9.3 ng/mL (ref 7.3–270.7)

## 2023-05-25 LAB — LACTATE DEHYDROGENASE: LACTATE DEHYDROGENASE: 240 U/L (ref 120–246)

## 2023-05-25 MED ORDER — RALOXIFENE 60 MG TABLET
ORAL_TABLET | Freq: Every day | ORAL | 0 refills | 90.00 days
Start: 2023-05-25 — End: ?

## 2023-05-25 NOTE — Unmapped (Addendum)
 PLAN:   1) Ferrous Sulfate  325 mg three times per week   2) Labs in 4 weeks ([facility] - send a MyChart)    - If you don't respond, we can IV iron .   3) I will look into creams  4) Continue the exercise   5) Get your colonoscopy  6) Continue IFN.    7) You can add Whey protein.   8) I will see you 3 months.     ASSESSMENT:  Your counts are beautifully controlled.  We could not ask for better.  You do have a mild degree of anemia.  I do not think this is adding your fatigue.      You are iron  deficient. This may be the reason for how you feel.     - Colon cancer: I would get this screening    - Oral iron : Take 375 mg ferrous sulfate  three times per week.      I am not sure how much adding a JAK2 inhibitor will help with the symptoms.      You may want to consider Whey protein with whole milk.     The creams that are used include phenytoin 5%, capsaicin, lidocaine ,  and TAT.    I can send you more info on it.      Second gen antihistamines: Zyrtec, claritin, allegra, and Xyzal.  These can be taken up to 4 times a day.      All lab results last 24 hours:    Recent Results (from the past 24 hours)   Reticulocytes    Collection Time: 05/25/23 12:58 PM   Result Value Ref Range    Reticulocyte Auto % 0.93 0.50 - 2.17 %    Absolute Auto Reticulocyte 36.3 23.0 - 100.0 10*9/L   Haptoglobin    Collection Time: 05/25/23 12:58 PM   Result Value Ref Range    Haptoglobin 105 30 - 200 mg/dL   CBC w/ Differential    Collection Time: 05/25/23 12:58 PM   Result Value Ref Range    WBC 4.9 3.6 - 11.2 10*9/L    RBC 3.90 (L) 3.95 - 5.13 10*12/L    HGB 10.5 (L) 11.3 - 14.9 g/dL    HCT 09.8 (L) 11.9 - 44.0 %    MCV 79.7 77.6 - 95.7 fL    MCH 26.9 25.9 - 32.4 pg    MCHC 33.7 32.0 - 36.0 g/dL    RDW 14.7 (H) 82.9 - 15.2 %    MPV 8.0 6.8 - 10.7 fL    Platelet 400 150 - 450 10*9/L    Neutrophils % 58.2 %    Lymphocytes % 24.3 %    Monocytes % 13.9 %    Eosinophils % 2.3 %    Basophils % 1.3 %    Absolute Neutrophils 2.8 1.8 - 7.8 10*9/L Absolute Lymphocytes 1.2 1.1 - 3.6 10*9/L    Absolute Monocytes 0.7 0.3 - 0.8 10*9/L    Absolute Eosinophils 0.1 0.0 - 0.5 10*9/L    Absolute Basophils 0.1 0.0 - 0.1 10*9/L    Microcytosis Slight (A) Not Present    Hypochromasia Slight (A) Not Present     All lab results last 24 hours:    Recent Results (from the past 24 hours)   Ferritin    Collection Time: 05/25/23 12:58 PM   Result Value Ref Range    Ferritin 9.3 7.3 - 270.7 ng/mL   Reticulocytes    Collection Time: 05/25/23 12:58 PM  Result Value Ref Range    Reticulocyte Auto % 0.93 0.50 - 2.17 %    Absolute Auto Reticulocyte 36.3 23.0 - 100.0 10*9/L   Haptoglobin    Collection Time: 05/25/23 12:58 PM   Result Value Ref Range    Haptoglobin 105 30 - 200 mg/dL   Comprehensive Metabolic Panel    Collection Time: 05/25/23 12:58 PM   Result Value Ref Range    Sodium 143 135 - 145 mmol/L    Potassium 4.0 3.5 - 5.1 mmol/L    Chloride 107 98 - 107 mmol/L    CO2 26.0 20.0 - 31.0 mmol/L    Anion Gap 10 5 - 14 mmol/L    BUN 15 9 - 23 mg/dL    Creatinine 1.61 0.96 - 1.02 mg/dL    BUN/Creatinine Ratio 16     eGFR CKD-EPI (2021) Female 65 >=60 mL/min/1.26m2    Glucose 101 70 - 179 mg/dL    Calcium 9.7 8.7 - 04.5 mg/dL    Albumin 3.8 3.4 - 5.0 g/dL    Total Protein 6.8 5.7 - 8.2 g/dL    Total Bilirubin 0.5 0.3 - 1.2 mg/dL    AST 25 <=40 U/L    ALT 21 10 - 49 U/L    Alkaline Phosphatase 119 (H) 46 - 116 U/L   Lactate dehydrogenase    Collection Time: 05/25/23 12:58 PM   Result Value Ref Range    LDH 240 120 - 246 U/L   CBC w/ Differential    Collection Time: 05/25/23 12:58 PM   Result Value Ref Range    WBC 4.9 3.6 - 11.2 10*9/L    RBC 3.90 (L) 3.95 - 5.13 10*12/L    HGB 10.5 (L) 11.3 - 14.9 g/dL    HCT 98.1 (L) 19.1 - 44.0 %    MCV 79.7 77.6 - 95.7 fL    MCH 26.9 25.9 - 32.4 pg    MCHC 33.7 32.0 - 36.0 g/dL    RDW 47.8 (H) 29.5 - 15.2 %    MPV 8.0 6.8 - 10.7 fL    Platelet 400 150 - 450 10*9/L    Neutrophils % 58.2 %    Lymphocytes % 24.3 %    Monocytes % 13.9 % Eosinophils % 2.3 %    Basophils % 1.3 %    Absolute Neutrophils 2.8 1.8 - 7.8 10*9/L    Absolute Lymphocytes 1.2 1.1 - 3.6 10*9/L    Absolute Monocytes 0.7 0.3 - 0.8 10*9/L    Absolute Eosinophils 0.1 0.0 - 0.5 10*9/L    Absolute Basophils 0.1 0.0 - 0.1 10*9/L    Microcytosis Slight (A) Not Present    Hypochromasia Slight (A) Not Present

## 2023-05-25 NOTE — Unmapped (Signed)
 ID: Ms Feazell is a 68 y.o. with a JAK2+ MPN     DZ CHAR: ET (JAK2+)  BM Bx (2018):  Increased atypical megakaryocytes with clustering  2% blasts  JAK2 mutation (VAF 3.5%)  Nl karyotype   Cellularity from 40 - 50%  Patchy mild increase in reticulin; no increase in collagen; no fibrosis  No monoclonal B or T cell populations  IPSET 2: High risk (age, JAK2)   CV Risk  LDL: 110  HgbA1c: 5.3  BP: Borderline  Tx:   Hydrea  (DC'd due to inc MPN Sx  Ruxolitnib (DC'd due to anemia)  Momelotinib     ASSESSMENT:   Ms Hett is a 68 y.o. 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  At the time of her diagnosis, she had no circulating immature or CD34+ cells, no increase in fibrosis, a normal LDH, and no palpable splenomegaly.      She was transitioned from hydroxyurea  to ruxolitinib  due to increased MPN related sx.  This change reduced her MPN symptom score initially from 48 to 21.  Unfortunately, she developed anemia.  As a result, she was switched to momalotinib.  This did raise her hemoglobin, but she developed significant neuropathy,  Thus, she was switched to IFN.      Her JAK2 VAF is rather low.  I am hoping that the use of IFN can put her into a molecular remission.  If so, she may be able to come off of treatment altogether.  The change to IFN has made her MPN symptoms worse.  We can consider adding ruxolitinib  back at a lower dose.      However, it may be reasonable to deal with her iron  deficiency anemia first.  This may improve her symptoms.  If there is no change, then I would consider adding back low dose ruxoltinib.      Her B1 levels are low (though normal). Adding a B vitamin supplement is reasonable.      As far as her neuropathy goes, she may do well with topical compounded treatments.  I would start with phenytoin 10% gel, which has a 70% response rate.  Qutenza 8% is also effective though this will need to be placed by a health care provider.      PLAN:   1) Ferrous Sulfate  325 mg three times per week   2) Labs in 4 weeks ([facility])  Send CBC, retic, iron  panel  If there is no response, I would move to IV iron .   3) Try phenytoin 10% cream 2 to 3 times a day.  This has a quick onset of action.    4) Continue the exercise   5) I encouraged her to get a colonoscopy (though her iron  deficiency may be due to her PCV)  6) Continue IFN at the current dose. Aaron Aas    7) Recheck LDL with her next visit.  The goal is LDL < 70.   8) Recommended a B complex supplement and protein supplement (Whey)   9) RTC in 3 months.     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  04/2016: Began Hydrea  at 500 mg every day; increased to 1000 mg  05/2016: BM biopsy   Increased atypical megakaryocytes with clustering  No fibrosis, 2% blasts  JAK2 mutation (VAF 3.5%)  Nl karyotype   Cellularity from 40 - 50%  Patchy mild increase in reticulin; no increase in collagen  No monoclonal B or T cell populations    03/15/19: Seen at [facility]  CBC: 7.3/12.0/312  LDH: WNL  CD34: 0.02  VWF: Normal   MPN score: 48 to 49  Began ruxolitinib  10 mg BID    04/05/19  CBC: 7.3/12.0/312  MPN score: 36 - 38  Ruxolitinib  10 mg BID; Hydrea  was stopped     05/17/19: Ruxolitinib  20 mg BID  LDH: 1.30 x ULN  MPN Score: 27    05/31/19: Ruxolitinib  15 mg BID (decrease due to anemia of 10.6)    07/12/19: Ruxolitinib  10 mg BID (decrease due to anemia of 10.4, plts 477); added Hydrea  500 mg a day   CD34: 0.12  LDH: 1.42 x ULN  MPN Score: 32    10/25/19: Ruxolitinib  5 mg BID (continued anemia); Hydrea  at 500 mg   LDH: 1.29 x ULN  MPN Score: 31    03/01/19:  Ruxolitinib  5 mg BID; Hydrea  at 500 mg   LDH: 1.39 x ULN  MPN Score: 23    05/09/20:  Ruxolitinib  5mg  qAM and 10mg  qPM;  Stopped hydrea     06/12/20: Ruxolitinib  5mg  qAM and 10mg  qPM;  LDH 1.39 x ULN  MPN score: 34 but reports feeling better than last visit even with higher score.    08/29/20: 4.8/11.0/431  02/27/21: 6.2/11.4/477; Ruxolitinib  10 mg BID  MPN: 21  Xol: 159  LDH: 1.17 x ULN  07/13/22: Neuropsychometric assessment is c/w subcortical pattern of cognitive impairment, not consistent with Alzheimer's; c/w severe cerebrovascular disease and NPH  09/09/21: 5.4/10.7/495  12/2022: Shunt placed.   03/11/22: 8.8/11.9/659;   CD34: 13; B12 634; LDH 337;   HDL: 58; Non-HDL: 132  05/11/22: Momelotinib 200 mg QD  05/29/22: 4.6/10.2/555  HgbA1c: 5.3    08/24/22: 6.1/10.5/473; MPN Score: 24  12/08/22: 4.9/10.7/504; momelotinib DC'd due to peripheral neuropathy   Variants of Known/Likely Clinical Significance:   Gene Coding Predicted Protein Variant allele fraction   JAK2 c.1849G>T p.(Val617Phe) [V617F] 4.2%     12/24/22: Began interferon  03/02/23: 5.6/10.4/411  LDH: 224  05/24/24: 4.9/10.5/400;   Ferritin: 9.3  CD34: 0.06  Began ferrous sulfate .     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    She experiences significant fatigue, rated as 7 to 8 out of 10, which has worsened over the past 24 hours. Even minimal physical activity, such as an hour of yard work, leaves her exhausted, whereas she previously could work for five hours. She continues her interferon treatment at a dose of 45 micrograms every two weeks.    She has experienced unintentional weight loss of over 10 pounds, attributed to a lack of appetite. She feels full quickly after eating and has been forcing herself to eat, often consuming yogurt in the morning. Her activity level is low, engaging in Qigong exercises three times a week and attempting yard work, but she has had to hire help for tasks like mowing the lawn due to her fatigue. She rates her activity level as a 6 out of 10, with 0 being normal and 10 being the worst possible.    She experiences neuropathy, particularly in her feet, which feel like 'ice blocks'. She uses a topical cream with various ingredients, including frankincense and arnica, which provides some relief. She prefers topical treatments over oral medications for her neuropathy.    She reports night sweats, rated as a 3  out of 10, and manageable itching, for which she takes cetirizine twice daily. She also experiences occasional bone pain in her legs, rated as a 3 out of 10.    She has a history of anemia and is currently experiencing low ferritin levels, which may be contributing to her fatigue. She is not currently taking iron  supplements.    She has a family history of colon cancer, as her father died from it at age 61. She is overdue for a colonoscopy.      MPN 10 Score  Symptom (Absent 0 to 10 worst imaginable) 2/21 2/21 4/21 6/21 9/21  1/22  5/22 1/23 8/23 1/24 4/24 7/24 10/24 03/02/23 05/25/23   Fatigue in the past 24 hours 6 9 7 9 7  8 6  5 5 5 5 3 5  6-7  7-8  Even outside for an hour    Filling up quickly when you eat (Early satiety)  7 7 5 5 5 5 4   0 5 5 2  2-3 2-3  - 3 - 4  3-4   Abdominal discomfort  3 4 4 4 4 5  4  2   0 6 4 - 5 2   0 2 0 Gassy    Inactivity 5 5 - 6 5 7 5 4  4  4 2  3 6  5 5 5  Three times per week exercise - getting outside 1 hour can't mow yard 6   Problems with concentration - 6 7-8 6 7 6 2  5  3 6 6  6-7 4 6 6  Clarity is better - 4-5   Numbness/ Tingling (in my hands and feet) 5 to 6 0 0 0 0 0  0  0 0 0 0 3 7  9  Feet - are bad - off balace   fingers are annoying   Usign 9   Night sweats 0 0 1 0 0 0  1 0 4 2-3 0 0 0 2  3    Itching (pruritus) 10 7 0 0 3 3   7 7 7 5 4 1  7 5  Taking antihistamin, 4 -5    Bone pain (diffuse not joint pain or arthritis) 6 6 3  0 1 0  3  0 2 -3 0 4-5  3   0 3 In legs feel sore   3   Fever (>100 F) 0 0 0 0 0 0  0  0 0 0 0 0 0 0 0   Unintentional weight loss last 6 months 0 0 0 0 0 0  0  0 1 - 2 0 0 0 2 0 10 lbs - not hungry   4   TOTAL 48 - 49 36 - 38 27 32 31 27   34 21 32 - 34 32-34 31 - 34 24 34-35 41 43-45     PHYSICAL EXAM:  VS: As recorded in Epic  GENERAL: She appears well   HEENT: OP is clear  LYMPH NODES: No LAN  LUNGS: CTA  COR: RRR  ABD: NTND; no HSM  ZOX:WRUEAV are intact; no edema  NEURO: Gait is slightly wide; fluent speech    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; PR pos, positive margin  L Mastectomy - no malignancy; neg LN  Txed with Surgery, radiation, auto BMT, raloxifene   Negative WU  for 28 genes associated with hereditary cancers  Raloxifene   Sjogren's syndrome  Autoimmune thyroiditis - Txed with methimazole   GERD - Txed with PPI  Mixed hyperlipidemia - Tx with Lovaza , pravastatin      FHx:  Daughter w/ spina bifida, BRCA 1081G>A mutation (w/o cancer)      2016 WHO Diagnostic Criteria for MPNs    ET  Major Criteria  Plts > 450  BM biopsy showing proliferation mainly of the megakaryocyte lineage with increased numbers of enlarged, mature megakaryocytes with hyperlobulated nuclei. No significant left-shift of neutrophil granulopoiesis or erythropoiesis and very rarely minor (grade 1) increase in reticulin fibers  Not meeting WHO criteria for BCR-ABL1?+?CML, PV, PMF, MDS, or other myeloid neoplasms)  Presence of JAK2, CALR or MPL mutation    Minor Criteria  Presence of a clonal marker (e.g., abnormal karyotype)  Absence of evidence for reactive thrombocytosis    Diagnosis: All major criteria or first three major and one minor     PMF  Major criteria  Proliferation and atypia of megakaryocytes accompanied by either reticulin and/or collagen fibrosis  grades 2 or 3 on a scale of 0 to 3  Not meeting WHO criteria for ET, PV, BCR-ABL1+ CML, myelodysplastic syndromes, or other myeloid neoplasm  Presence of JAK2, CALR or MPL mutation or in the absence of these mutations, presence of another clonal marker or absence of reactive myelofibrosis    Minor Criteria  Anemia not attributed to another comorbid condition  WBC > 11.0  Palpable Splenomegaly   LDH > ULN  Leukoerythroblastosis     Diagnosis: 3 major and at least 1 minor confirmed in two consecutive determinations    IPSET Model for Prediction of Thrombosis    Age:    2   < 60: 0   > 60: 2  WBC count (x10e9)  0   < 11: 0   > 11: 1  Hx of Thrombosis  0   No: 0   Yes: 1  --------------------------------------------  Total:    2     10 yr thrombosis risk, median survival (yrs)  Lo Risk: 0 pts    --> 89%, NR  Int Risk: 1-2 pts --> 86%, 24.5  Hi Risk: 3-4 pts  --> 69%, 14.7    IPSET II  Age:    1   < 60: 0   > 60: 1  CVS Risk Factor*  0   No: 0   Yes: 1   Hx of Thrombosis  0   No: 0   Yes: 2  JAK2     2   No: 0   Yes: 2  --------------------------------------------  Total:    3    *Gave up smoking 10 years ago, near optimal non-LDL on treatment, non HTN    Annual Thrombosis Risk  Lo Risk: 0-1 pts  --> 1.03%  Int Risk: 2 pts     --> 2.35%  Hi Risk: 3-6 pts  --> 3.56%     NOTE  JAK2 conveys a higher risk of thrombosis  CALR conveys a higher risk of bleeding    Newer Model:  [email or web address]    Primary prophylaxis of thrombosis  All patients: Aspirin unless    VWF activity <30%,   Platelet >?1 million,   CALR-mutated low-risk ET    PV with Hct >?45%: Add phlebotomy/cytoreduction to target Hct <?45%  Age >?60 years and/or prior history of thrombosis: Add cytoreduction  Secondary prophylaxis after thrombotic event  All patients: Cytoreduction  Typical VTE: Consider indefinite VKA for most patients; aspirin if not on VKA  Atypical VTE: Indefinite VKA  Arterial  thrombosis: Aspirin

## 2023-05-26 ENCOUNTER — Telehealth: Payer: Self-pay | Admitting: Internal Medicine

## 2023-05-26 MED ORDER — FERROUS SULFATE 325 MG (65 MG IRON) TABLET,DELAYED RELEASE
ORAL_TABLET | ORAL | 3 refills | 70.00 days | Status: CP
Start: 2023-05-26 — End: 2024-03-01

## 2023-05-26 NOTE — Telephone Encounter (Signed)
 Good morning Dr. Willy Harvest,   Doc of Day 4/16 AM  We received a referral for patient to have a colonoscopy. Patient last had a colonoscopy in 2019 with [place]. States her Father has history of colon cancer. Patient is requesting to transfer her care due to previous provider retiring. Patient record is Epic under Procedure tab for you to review and advise on scheduling.   Thank you.

## 2023-05-26 NOTE — Telephone Encounter (Signed)
 Unable to accept at this time Would let her know that [name] has new gastroenterologists that are taking Dr. Shelly Diamond patients

## 2023-05-27 DIAGNOSIS — Z853 Personal history of malignant neoplasm of breast: Principal | ICD-10-CM

## 2023-05-27 MED ORDER — RALOXIFENE 60 MG TABLET
ORAL_TABLET | Freq: Every day | ORAL | 0 refills | 90.00 days | Status: CP
Start: 2023-05-27 — End: ?

## 2023-05-29 NOTE — Unmapped (Signed)
 Here is the plan for Ms Sanroman:     1) Ferrous Sulfate  325 mg three times per week   2) Labs in 4 weeks ([facility])  Send CBC, retic, iron  panel  If there is no response, I would move to IV iron .   We will need to FU on these labs   3) Try phenytoin 10% cream 2 to 3 times a day.   Sheh-Li, I am not sure how to get these compounded substances ordered.  This cream is said to have a 70-80% response rate.  Feel free to edit my smart phrase HVDSMALLFIBERNEUROPATHY so I don't have to keep asking you.    4) I encouraged her to get a colonoscopy (though her iron  deficiency may be due to her PCV)  This should be set up locally.Aaron Aas   5) Continue IFN at the current dose.    6) Recheck LDL with her next visit.  The goal is LDL < 70.   7) RTC in 3 months.     Thanks for your help

## 2023-06-01 LAB — VITAMIN B6: VITAMIN B6: 7 ug/L

## 2023-06-01 NOTE — Unmapped (Signed)
 [facility] received a request from Mechele Spiegel, LCSW to remove this patient from the Diabetes registry.     Chart review was performed. Patient was included in Diabetes registry due to encounter diagnoses: 12/08/22, 08/25/22, 05/31/22  for Diabetes in the past 18 months.     This patient was removed from the registry until 11/30/2024, when the patient will be reevaluated for inclusion into Diabetes registry.     Original Request:     Diabetes registry  Received: Today  Watt Hackney, Amy Naseme, LCSW  P Help Desk - Registry Removal  Phone Number: [phone]     Registry removal requests are for cases where the patient does not have the particular condition or the condition has resolved. These requests will go through clinical review by [facility]. You will receive a response within 5 business days.    Please fill out the following information to request your patient be removed from a registry in Epic. Once sent, [facility] will be in contact with you through In Basket regarding your request.    Department Requesting Removal: Family Medicine  Provider Requesting Removal: [place]  Registry to be Removed From: Diabetes  Please explain why the patient should be removed from the above registry: Patient does not have diabetes.          Follow-up Info    Contact person: [id number] - Reagan Camera, LCSW   Phone number: [phone]   Submitted by: [id number] Watt Hackney, Amy Naseme, LCSW   Additional Information    Keep the contact person notified of progress on this correction request: Yes     Screenshot     View screenshot(s)           Session Info    Patient name: Christy Buchanan   Patient internal ID: [id number]   Patient external ID: [id number]   Workstation identifier: [email or web address]   Workspace kind: AC_CLINICAL   Workspace title: Loading...   Hyperspace host name: [email or web address]   Activity name: Chart Review   Activity descriptor: UCW_CHART_REVIEW   Login user: [phone] - Tammie Fall   Login department: [id number] [facility] [place]   Compiled user profile: [phone] - COMPILED;[phone];;[id number];;;1   User role: [phone] - MODEL AMB CLINICAL SUPPORT STAFF    [phone] - MODEL AMB RADAR SUPPORT STAFF   EpicCare security class: [id number] - CARE MANAGER AMB NURSE   Inpatient security class: [id number] - MODEL IP NURSE - IP   Shared security class: [id number] - MODEL SHARED CLINICAL-LEVEL 2-AMBULATORY USER WITH PROXY   Hyperspace session ID: [id number]     If you have further questions, please contact Sharlet Dawson, LPN in Insurance account manager.

## 2023-06-07 DIAGNOSIS — D473 Essential (hemorrhagic) thrombocythemia: Principal | ICD-10-CM

## 2023-06-09 LAB — CBC W/ DIFFERENTIAL
BANDED NEUTROPHILS ABSOLUTE COUNT: 0 10*3/uL (ref 0.0–0.1)
BANDED NEUTROPHILS ABSOLUTE COUNT: 0 10*3/uL (ref 0.0–0.1)
BASOPHILS ABSOLUTE COUNT: 0 10*3/uL (ref 0.0–0.2)
BASOPHILS ABSOLUTE COUNT: 0 10*3/uL (ref 0.0–0.2)
BASOPHILS RELATIVE PERCENT: 1 %
BASOPHILS RELATIVE PERCENT: 1 %
EOSINOPHILS ABSOLUTE COUNT: 0.1 10*3/uL (ref 0.0–0.4)
EOSINOPHILS ABSOLUTE COUNT: 0.1 10*3/uL (ref 0.0–0.4)
EOSINOPHILS RELATIVE PERCENT: 3 %
EOSINOPHILS RELATIVE PERCENT: 3 %
HEMATOCRIT: 32.1 % — ABNORMAL LOW (ref 34.0–46.6)
HEMATOCRIT: 32.6 % — ABNORMAL LOW (ref 34.0–46.6)
HEMOGLOBIN: 10.2 g/dL — ABNORMAL LOW (ref 11.1–15.9)
HEMOGLOBIN: 10.1 g/dL — ABNORMAL LOW (ref 11.1–15.9)
IMMATURE GRANULOCYTES: 0 %
IMMATURE GRANULOCYTES: 0 %
LYMPHOCYTES ABSOLUTE COUNT: 1 10*3/uL (ref 0.7–3.1)
LYMPHOCYTES ABSOLUTE COUNT: 1.1 10*3/uL (ref 0.7–3.1)
LYMPHOCYTES RELATIVE PERCENT: 22 %
LYMPHOCYTES RELATIVE PERCENT: 25 %
MEAN CORPUSCULAR HEMOGLOBIN CONC: 31.8 g/dL (ref 31.5–35.7)
MEAN CORPUSCULAR HEMOGLOBIN CONC: 31 g/dL — ABNORMAL LOW (ref 31.5–35.7)
MEAN CORPUSCULAR HEMOGLOBIN: 27.2 pg (ref 26.6–33.0)
MEAN CORPUSCULAR HEMOGLOBIN: 26.7 pg (ref 26.6–33.0)
MEAN CORPUSCULAR VOLUME: 86 fL (ref 79–97)
MEAN CORPUSCULAR VOLUME: 86 fL (ref 79–97)
MONOCYTES ABSOLUTE COUNT: 0.6 10*3/uL (ref 0.1–0.9)
MONOCYTES ABSOLUTE COUNT: 0.5 10*3/uL (ref 0.1–0.9)
MONOCYTES RELATIVE PERCENT: 13 %
MONOCYTES RELATIVE PERCENT: 12 %
NEUTROPHILS ABSOLUTE COUNT: 2.7 10*3/uL (ref 1.4–7.0)
NEUTROPHILS ABSOLUTE COUNT: 2.6 10*3/uL (ref 1.4–7.0)
NEUTROPHILS RELATIVE PERCENT: 61 %
NEUTROPHILS RELATIVE PERCENT: 59 %
PLATELET COUNT: 454 10*3/uL — ABNORMAL HIGH (ref 150–450)
PLATELET COUNT: 460 10*3/uL — ABNORMAL HIGH (ref 150–450)
RED BLOOD CELL COUNT: 3.75 x10E6/uL — ABNORMAL LOW (ref 3.77–5.28)
RED BLOOD CELL COUNT: 3.78 x10E6/uL (ref 3.77–5.28)
RED CELL DISTRIBUTION WIDTH: 16.1 % — ABNORMAL HIGH (ref 11.7–15.4)
RED CELL DISTRIBUTION WIDTH: 15.8 % — ABNORMAL HIGH (ref 11.7–15.4)
WHITE BLOOD CELL COUNT: 4.5 10*3/uL (ref 3.4–10.8)
WHITE BLOOD CELL COUNT: 4.3 10*3/uL (ref 3.4–10.8)

## 2023-06-09 LAB — COMPREHENSIVE METABOLIC PANEL
ALBUMIN: 4.2 g/dL (ref 3.9–4.9)
ALKALINE PHOSPHATASE: 127 IU/L — ABNORMAL HIGH (ref 44–121)
ALT (SGPT): 26 IU/L (ref 0–32)
AST (SGOT): 27 IU/L (ref 0–40)
BILIRUBIN TOTAL (MG/DL) IN SER/PLAS: 0.3 mg/dL (ref 0.0–1.2)
BLOOD UREA NITROGEN: 16 mg/dL (ref 8–27)
BUN / CREAT RATIO: 17 (ref 12–28)
CALCIUM: 9.3 mg/dL (ref 8.7–10.3)
CHLORIDE: 107 mmol/L — ABNORMAL HIGH (ref 96–106)
CO2: 21 mmol/L (ref 20–29)
CREATININE: 0.95 mg/dL (ref 0.57–1.00)
EGFR: 65 mL/min/{1.73_m2}
GLOBULIN, TOTAL: 2.4 g/dL (ref 1.5–4.5)
GLUCOSE: 83 mg/dL (ref 70–99)
POTASSIUM: 4.5 mmol/L (ref 3.5–5.2)
SODIUM: 144 mmol/L (ref 134–144)
TOTAL PROTEIN: 6.6 g/dL (ref 6.0–8.5)

## 2023-06-09 LAB — IRON & TIBC
IRON SATURATION: 14 % — ABNORMAL LOW (ref 15–55)
IRON: 51 ug/dL (ref 27–139)
TOTAL IRON BINDING CAPACITY: 363 ug/dL (ref 250–450)
UNSATURATED IRON BINDING CAPACITY: 312 ug/dL (ref 118–369)

## 2023-06-09 LAB — FERRITIN: FERRITIN: 23 ng/mL (ref 15–150)

## 2023-06-09 LAB — RETICULOCYTES: RETICULOCYTE COUNT PCT: 1.6 % (ref 0.6–2.6)

## 2023-06-10 NOTE — Unmapped (Signed)
 [facility] Refill Coordination Note    Christy Buchanan, DOB: [birth year]  Phone: [phone] (home)       All above HIPAA information was verified with patient.         06/09/2023     2:14 PM   Specialty Rx Medication Refill Questionnaire   Which Medications would you like refilled and shipped? I currently do not have any [facility] medications on hand. The last injection was April 25th and I will be due for my next refills for May 9th and May 23rd.   I do my injections every two weeks on Friday.   If medication refills are not needed at this time, please indicate the reason below. nka   Please list all current allergies: NKA   Have you missed any doses in the last 30 days? No   Have you had any changes to your medication(s) since your last refill? No   How many days remaining of each medication do you have at home? None - last one was used on April 25th   If receiving an injectable medication, next injection date is 06/18/2023   Have you experienced any side effects in the last 30 days? Yes   Please list the medication side effects below. (A pharmacist will reach out to you shortly to discuss your side effects. Note: your medications will not be shipped until we reach out to you). Mild cold like symptoms and fatigue for a few days   Please enter the full address (street address, city, state, zip code) where you would like your medication(s) to be delivered to. [address], [place], [place] [address]   Please specify on which day you would like your medication(s) to arrive. Note: if you need your medication(s) within 3 days, please call the pharmacy to schedule your order at [phone]  06/16/2023   Has your insurance changed since your last refill? No   Would you like a pharmacist to call you to discuss your medication(s)? No   Do you require a signature for your package? (Note: if we are billing Medicare Part B or your order contains a controlled substance, we will require a signature) No I have been provided my out of pocket cost for my medication and approve the pharmacy to charge the amount to my credit card on file. Yes         Completed refill call assessment today to schedule patient's medication shipment from the [facility] [phone]).  All relevant notes have been reviewed.       Confirmed patient received a Conservation officer, historic buildings and a Surveyor, mining with first shipment. The patient will receive a drug information handout for each medication shipped and additional FDA Medication Guides as required.         REFERRAL TO PHARMACIST     Referral to the pharmacist: Yes - new side effects are reported: mild cold like symptoms and fatigue for a few days. Refills were scheduled and concern routed (high priority) to pharmacist for evaluation.      SHIPPING     Shipping address confirmed in Epic.     Delivery Scheduled: Yes, Expected medication delivery date: 06/16/23.     Medication will be delivered via Same Day Courier to the prescription address in Epic WAM.    Christy Buchanan   [facility] Specialty Technician

## 2023-06-10 NOTE — Unmapped (Signed)
 This pharmacist was notified by a technician that this patient has reported they are experiencing the following side effects cold like symptoms.. I have reviewed the patient's medical record and have determined that no further pharmacist action is needed.      Approximate time spent: 0-5 minutes    Tawona Filsinger Dennard Fisher, PharmD, Clinical Specialty Pharmacist  [facility]

## 2023-06-16 MED FILL — PEGASYS 180 MCG/ML SUBCUTANEOUS SOLUTION: SUBCUTANEOUS | 28 days supply | Qty: 2 | Fill #2

## 2023-06-21 DIAGNOSIS — D473 Essential (hemorrhagic) thrombocythemia: Principal | ICD-10-CM

## 2023-07-05 DIAGNOSIS — D473 Essential (hemorrhagic) thrombocythemia: Principal | ICD-10-CM

## 2023-07-07 LAB — CBC W/ DIFFERENTIAL
BANDED NEUTROPHILS ABSOLUTE COUNT: 0 10*3/uL (ref 0.0–0.1)
BASOPHILS ABSOLUTE COUNT: 0.1 10*3/uL (ref 0.0–0.2)
BASOPHILS RELATIVE PERCENT: 1 %
EOSINOPHILS ABSOLUTE COUNT: 0.1 10*3/uL (ref 0.0–0.4)
EOSINOPHILS RELATIVE PERCENT: 1 %
HEMATOCRIT: 31.8 % — ABNORMAL LOW (ref 34.0–46.6)
HEMOGLOBIN: 10.3 g/dL — ABNORMAL LOW (ref 11.1–15.9)
IMMATURE GRANULOCYTES: 0 %
LYMPHOCYTES ABSOLUTE COUNT: 1.5 10*3/uL (ref 0.7–3.1)
LYMPHOCYTES RELATIVE PERCENT: 24 %
MEAN CORPUSCULAR HEMOGLOBIN CONC: 32.4 g/dL (ref 31.5–35.7)
MEAN CORPUSCULAR HEMOGLOBIN: 29.1 pg (ref 26.6–33.0)
MEAN CORPUSCULAR VOLUME: 90 fL (ref 79–97)
MONOCYTES ABSOLUTE COUNT: 1 10*3/uL — ABNORMAL HIGH (ref 0.1–0.9)
MONOCYTES RELATIVE PERCENT: 16 %
NEUTROPHILS ABSOLUTE COUNT: 3.5 10*3/uL (ref 1.4–7.0)
NEUTROPHILS RELATIVE PERCENT: 58 %
PLATELET COUNT: 437 10*3/uL (ref 150–450)
RED BLOOD CELL COUNT: 3.54 x10E6/uL — ABNORMAL LOW (ref 3.77–5.28)
RED CELL DISTRIBUTION WIDTH: 16.8 % — ABNORMAL HIGH (ref 11.7–15.4)
WHITE BLOOD CELL COUNT: 6.1 10*3/uL (ref 3.4–10.8)

## 2023-07-07 LAB — COMPREHENSIVE METABOLIC PANEL
ALBUMIN: 4.2 g/dL (ref 3.9–4.9)
ALKALINE PHOSPHATASE: 133 IU/L — ABNORMAL HIGH (ref 44–121)
ALT (SGPT): 16 IU/L (ref 0–32)
AST (SGOT): 21 IU/L (ref 0–40)
BILIRUBIN TOTAL (MG/DL) IN SER/PLAS: 0.3 mg/dL (ref 0.0–1.2)
BLOOD UREA NITROGEN: 17 mg/dL (ref 8–27)
BUN / CREAT RATIO: 18 (ref 12–28)
CALCIUM: 9.3 mg/dL (ref 8.7–10.3)
CHLORIDE: 105 mmol/L (ref 96–106)
CO2: 21 mmol/L (ref 20–29)
CREATININE: 0.93 mg/dL (ref 0.57–1.00)
EGFR: 67 mL/min/{1.73_m2}
GLOBULIN, TOTAL: 2.1 g/dL (ref 1.5–4.5)
GLUCOSE: 86 mg/dL (ref 70–99)
POTASSIUM: 5.1 mmol/L (ref 3.5–5.2)
SODIUM: 139 mmol/L (ref 134–144)
TOTAL PROTEIN: 6.3 g/dL (ref 6.0–8.5)

## 2023-07-08 MED FILL — BD TUBERCULIN SYRINGE 1 ML 27 X 1/2": 70 days supply | Qty: 10 | Fill #2

## 2023-07-15 NOTE — Unmapped (Signed)
 [facility] Clinical Assessment & Refill Coordination Note    Christy Buchanan, DOB: Jun 23, 1955  Phone: [phone] (home)     All above HIPAA information was verified with patient.     Was a Nurse, learning disability used for this call? No    Specialty Medication(s):   Hematology/Oncology: Pegasys     Current Outpatient Medications   Medication Sig Dispense Refill    acetaminophen (TYLENOL) 325 MG tablet Take 2 tablets (650 mg total) by mouth every four (4) hours as needed for fever (temp >38.5C).  0    amoxicillin (AMOXIL) 875 MG tablet       atorvastatin (LIPITOR) 80 MG tablet Take 1 tablet (80 mg total) by mouth daily. 90 tablet 1    cetirizine (ZYRTEC) 10 MG tablet Take 1 tablet (10 mg total) by mouth daily as needed for allergies.      citalopram (CELEXA) 10 MG tablet Take 3 tablets (30 mg total) by mouth Three (3) times a day before meals. 270 tablet 1    empty container (SHARPS CONTAINER) Misc Use as directed 1 each 2    ferrous sulfate 325 (65 FE) MG EC tablet Take 1 tablet (325 mg total) by mouth 3 (three) times a week. 30 tablet 3    Lactobacillus rhamnosus GG (CULTURELLE) 10 billion cell capsule Take 1 capsule by mouth daily.      multivitamin (TAB-A-VITE/THERAGRAN) per tablet Take 1 tablet by mouth daily.      naproxen sodium (ALEVE) 220 mg cap Take 1 tablet by mouth daily as needed. Headache, needed 2-3x last month      omega-3 fatty acids-fish oil 300-1,000 mg cap capsule Take 1 capsule by mouth in the morning.      omeprazole (PRILOSEC) 20 MG capsule Take 1 capsule (20 mg total) by mouth daily. 90 capsule 1    peginterferon alfa-2a (PEGASYS) 180 mcg/mL injection Inject 0.25 mL (45 mcg total) under the skin every fourteen (14) days. Discard remainder of vial after each dose. 4 mL 11    raloxifene (EVISTA) 60 mg tablet Take 1 tablet (60 mg total) by mouth daily. 90 tablet 0    simethicone (MYLICON) 80 MG chewable tablet Chew 1 tablet (80 mg total) every six (6) hours as needed for flatulence.      syringe 1 mL 27 x 1/2 Syrg Use as directed. 50 each 2    vitamins B1 B6 B12 Liqd Take by mouth every other day.       No current facility-administered medications for this visit.        Changes to medications: Chlora reports no changes at this time.    Medication list has been reviewed and updated in Epic: last 4/15    No Known Allergies    Changes to allergies: No    Allergies have been reviewed and updated in Epic: Yes    SPECIALTY MEDICATION ADHERENCE     Pegasys 180 mcg/ml: 0 doses of medicine on hand       Medication Adherence    Patient reported X missed doses in the last month: 0  Specialty Medication: Pegasys 180 mcg/ml  Patient is on additional specialty medications: No  Informant: patient          Specialty medication(s) dose(s) confirmed: Regimen is correct and unchanged.     Are there any concerns with adherence? No    Adherence counseling provided? Not needed    CLINICAL MANAGEMENT AND INTERVENTION  Clinical Benefit Assessment:    Do you feel the medicine is effective or helping your condition? Yes    Clinical Benefit counseling provided? Not needed    Adverse Effects Assessment:    Are you experiencing any side effects? No    Are you experiencing difficulty administering your medicine? No    Quality of Life Assessment:    Quality of Life    Rheumatology  Oncology  Dermatology  Cystic Fibrosis          How many days over the past month did your condition  keep you from your normal activities? For example, brushing your teeth or getting up in the morning. Patient declined to answer    Have you discussed this with your provider? Not needed    Acute Infection Status:    Acute infections noted within Epic:  No active infections    Patient reported infection: None    Therapy Appropriateness:    Is therapy appropriate based on current medication list, adverse reactions, adherence, clinical benefit and progress toward achieving therapeutic goals? Yes, therapy is appropriate and should be continued     Clinical Intervention:    Was an intervention completed as part of this clinical assessment? No    DISEASE/MEDICATION-SPECIFIC INFORMATION      For patients on injectable medications: Patient currently has - doses left.  Next injection is scheduled for ASAP.    Oncology: Is the patient receiving adequate infection prevention treatment? Not applicable  Does the patient have adequate nutritional support? Not applicable    PATIENT SPECIFIC NEEDS     Does the patient have any physical, cognitive, or cultural barriers? No    Is the patient high risk? No    Does the patient require physician intervention or other additional services (i.e., nutrition, smoking cessation, social work)? No    Does the patient have an additional or emergency contact listed in their chart? Yes    SOCIAL DETERMINANTS OF HEALTH     At the [facility], we have learned that life circumstances - like trouble affording food, housing, utilities, or transportation can affect the health of many of our patients.   That is why we wanted to ask: are you currently experiencing any life circumstances that are negatively impacting your health and/or quality of life? No    Social Drivers of Health     Food Insecurity: No Food Insecurity (06/15/2022)    Hunger Vital Sign     Worried About Running Out of Food in the Last Year: Never true     Ran Out of Food in the Last Year: Never true   Recent Concern: Food Insecurity - Food Insecurity Present (06/02/2022)    Hunger Vital Sign     Worried About Running Out of Food in the Last Year: Sometimes true     Ran Out of Food in the Last Year: Sometimes true   Tobacco Use: Medium Risk (04/27/2023)    Patient History     Smoking Tobacco Use: Former     Smokeless Tobacco Use: Never     Passive Exposure: Not on file   Transportation Needs: Unmet Transportation Needs (06/15/2022)    PRAPARE - Therapist, art (Medical): Yes     Lack of Transportation (Non-Medical): Yes   Alcohol Use: Unknown (05/03/2017)    Received from [facility], [facility]    AUDIT-C     Frequency of Alcohol Consumption: Monthly or less  Average Number of Drinks: Not on file     Frequency of Binge Drinking: Not on file   Housing: Low Risk  (06/15/2022)    Housing     Within the past 12 months, have you ever stayed: outside, in a car, in a tent, in an overnight shelter, or temporarily in someone else's home (i.e. couch-surfing)?: No     Are you worried about losing your housing?: No   Physical Activity: Not on file   Utilities: Low Risk  (06/15/2022)    Utilities     Within the past 12 months, have you been unable to get utilities (heat, electricity) when it was really needed?: No   Stress: Not on file   Interpersonal Safety: Not on file   Substance Use: Not on file (12/14/2022)   Intimate Partner Violence: Not on file   Social Connections: Not on file   Financial Resource Strain: Medium Risk (06/15/2022)    Overall Financial Resource Strain (CARDIA)     Difficulty of Paying Living Expenses: Somewhat hard   Health Literacy: Not on file   Internet Connectivity: Not on file       Would you be willing to receive help with any of the needs that you have identified today? Not applicable       SHIPPING     Specialty Medication(s) to be Shipped:   Hematology/Oncology: Pegasys    Other medication(s) to be shipped: No additional medications requested for fill at this time     Changes to insurance: No    Cost and Payment: Patient has a $0 copay, payment information is not required.    Delivery Scheduled: Yes, Expected medication delivery date: 07/16/23.     Medication will be delivered via Same Day Courier to the confirmed prescription address in [facility].    The patient will receive a drug information handout for each medication shipped and additional FDA Medication Guides as required.  Verified that patient has previously received a Conservation officer, historic buildings and a Surveyor, mining.    The patient or caregiver noted above participated in the development of this care plan and knows that they can request review of or adjustments to the care plan at any time.      All of the patient's questions and concerns have been addressed.    Starlyn Droge Dennard Fisher, PharmD   [facility] Delivery [facility] Specialty Pharmacist

## 2023-07-16 MED FILL — PEGASYS 180 MCG/ML SUBCUTANEOUS SOLUTION: SUBCUTANEOUS | 28 days supply | Qty: 2 | Fill #3

## 2023-07-19 DIAGNOSIS — D473 Essential (hemorrhagic) thrombocythemia: Principal | ICD-10-CM

## 2023-08-02 DIAGNOSIS — D473 Essential (hemorrhagic) thrombocythemia: Principal | ICD-10-CM

## 2023-08-02 NOTE — Unmapped (Signed)
 [facility] Home Delivery Pharm[facility] Refill Coordination Note    Christy Buchanan, DOB: [birth year]  Phone: [phone] (home)       All above HIPAA information was verified with patient.         08/02/2023    12:31 PM   Specialty Rx Medication Refill Questionnaire   Which Medications would you like refilled and shipped? PEGASYS  180 mcg/mL injection, Generic name: peginterferon alfa-2a , I have none on hand at this time. Next injection is on Friday July 4th   If medication refills are not needed at this time, please indicate the reason below. NA   Please list all current allergies: NKA   Have you missed any doses in the last 30 days? No   Have you had any changes to your medication(s) since your last refill? No   How many days remaining of each medication do you have at home? None   If receiving an injectable medication, next injection date is 08/13/2023   Have you experienced any side effects in the last 30 days? No   Please enter the full address (street address, city, state, zip code) where you would like your medication(s) to be delivered to. [address] [place], [place] [address]   Please specify on which day you would like your medication(s) to arrive. Note: if you need your medication(s) within 3 days, please call the pharmacy to schedule your order at [phone]  08/09/2023   Has your insurance changed since your last refill? No   Would you like a pharmacist to call you to discuss your medication(s)? No   Do you require a signature for your package? (Note: if we are billing Medicare Part B or your order contains a controlled substance, we will require a signature) No   I have been provided my out of pocket cost for my medication and approve the pharmacy to charge the amount to my credit card on file. No         Completed refill call assessment today to schedule patient's medication shipment from the [facility] [phone]).  All relevant notes have been reviewed. Confirmed patient received a Conservation officer, historic buildings and a Surveyor, mining with first shipment. The patient will receive a drug information handout for each medication shipped and additional FDA Medication Guides as required.         REFERRAL TO PHARMACIST     Referral to the pharmacist: Not needed      [facility]     Shipping address confirmed in Epic.     Delivery Scheduled: Yes, Expected medication delivery date: 08/09/23.     Medication will be delivered via Same Day Courier to the prescription address in Epic [facility].    Luvina Poirier   [facility] Special[facility] Home Delivery Pharmacy Specialty Technician

## 2023-08-09 MED FILL — PEGASYS 180 MCG/ML SUBCUTANEOUS SOLUTION: SUBCUTANEOUS | 28 days supply | Qty: 2 | Fill #4

## 2023-08-09 NOTE — Unmapped (Signed)
 Lab orders placed for iron  panel and ferritin.

## 2023-08-10 DIAGNOSIS — Z853 Personal history of malignant neoplasm of breast: Principal | ICD-10-CM

## 2023-08-10 MED ORDER — RALOXIFENE 60 MG TABLET
ORAL_TABLET | Freq: Every day | ORAL | 0 refills | 90.00000 days | Status: CP
Start: 2023-08-10 — End: ?

## 2023-08-16 DIAGNOSIS — D473 Essential (hemorrhagic) thrombocythemia: Principal | ICD-10-CM

## 2023-08-24 ENCOUNTER — Ambulatory Visit
Admit: 2023-08-24 | Discharge: 2023-08-25 | Payer: MEDICARE | Attending: Hematology & Oncology | Primary: Hematology & Oncology

## 2023-08-24 ENCOUNTER — Other Ambulatory Visit: Admit: 2023-08-24 | Discharge: 2023-08-25 | Payer: MEDICARE

## 2023-08-24 DIAGNOSIS — D471 Chronic myeloproliferative disease: Principal | ICD-10-CM

## 2023-08-24 DIAGNOSIS — E611 Iron deficiency: Principal | ICD-10-CM

## 2023-08-24 DIAGNOSIS — I639 Cerebral infarction, unspecified: Principal | ICD-10-CM

## 2023-08-24 LAB — CBC W/ AUTO DIFF
BASOPHILS ABSOLUTE COUNT: 0.1 10*9/L (ref 0.0–0.1)
BASOPHILS RELATIVE PERCENT: 1.2 %
EOSINOPHILS ABSOLUTE COUNT: 0.1 10*9/L (ref 0.0–0.5)
EOSINOPHILS RELATIVE PERCENT: 1.9 %
HEMATOCRIT: 34.1 % (ref 34.0–44.0)
HEMOGLOBIN: 11.7 g/dL (ref 11.3–14.9)
LYMPHOCYTES ABSOLUTE COUNT: 1.2 10*9/L (ref 1.1–3.6)
LYMPHOCYTES RELATIVE PERCENT: 21.6 %
MEAN CORPUSCULAR HEMOGLOBIN CONC: 34.2 g/dL (ref 32.0–36.0)
MEAN CORPUSCULAR HEMOGLOBIN: 27.9 pg (ref 25.9–32.4)
MEAN CORPUSCULAR VOLUME: 81.4 fL (ref 77.6–95.7)
MEAN PLATELET VOLUME: 7.8 fL (ref 6.8–10.7)
MONOCYTES ABSOLUTE COUNT: 0.6 10*9/L (ref 0.3–0.8)
MONOCYTES RELATIVE PERCENT: 10.3 %
NEUTROPHILS ABSOLUTE COUNT: 3.5 10*9/L (ref 1.8–7.8)
NEUTROPHILS RELATIVE PERCENT: 65 %
PLATELET COUNT: 399 10*9/L (ref 150–450)
RED BLOOD CELL COUNT: 4.19 10*12/L (ref 3.95–5.13)
RED CELL DISTRIBUTION WIDTH: 16.3 % — ABNORMAL HIGH (ref 12.2–15.2)
WBC ADJUSTED: 5.4 10*9/L (ref 3.6–11.2)

## 2023-08-24 LAB — RETICULOCYTES
RETICULOCYTE ABSOLUTE COUNT: 34.2 10*9/L (ref 23.0–100.0)
RETICULOCYTE COUNT PCT: 0.82 % (ref 0.50–2.17)

## 2023-08-24 LAB — COMPREHENSIVE METABOLIC PANEL
ALBUMIN: 4 g/dL (ref 3.4–5.0)
ALKALINE PHOSPHATASE: 149 U/L — ABNORMAL HIGH (ref 46–116)
ALT (SGPT): 25 U/L (ref 10–49)
ANION GAP: 10 mmol/L (ref 5–14)
AST (SGOT): 27 U/L (ref <=34)
BILIRUBIN TOTAL: 0.4 mg/dL (ref 0.3–1.2)
BLOOD UREA NITROGEN: 14 mg/dL (ref 9–23)
BUN / CREAT RATIO: 17
CALCIUM: 9.4 mg/dL (ref 8.7–10.4)
CHLORIDE: 107 mmol/L (ref 98–107)
CO2: 24 mmol/L (ref 20.0–31.0)
CREATININE: 0.84 mg/dL (ref 0.55–1.02)
EGFR CKD-EPI (2021) FEMALE: 75 mL/min/1.73m2 (ref >=60)
GLUCOSE RANDOM: 92 mg/dL (ref 70–179)
POTASSIUM: 3.8 mmol/L (ref 3.4–4.8)
PROTEIN TOTAL: 7.4 g/dL (ref 5.7–8.2)
SODIUM: 141 mmol/L (ref 135–145)

## 2023-08-24 LAB — LDL CHOLESTEROL, DIRECT: LDL CHOLESTEROL DIRECT: 53 mg/dL

## 2023-08-24 LAB — FERRITIN: FERRITIN: 17.4 ng/mL (ref 7.3–270.7)

## 2023-08-24 LAB — LACTATE DEHYDROGENASE: LACTATE DEHYDROGENASE: 255 U/L — ABNORMAL HIGH (ref 120–246)

## 2023-08-24 NOTE — Unmapped (Signed)
 ID: Christy Buchanan is a 68 y.o. with a JAK2+ MPN     DZ CHAR: ET (JAK2+)  BM Bx (2018):  Increased atypical megakaryocytes with clustering  2% blasts  JAK2 mutation (VAF 3.5%)  Nl karyotype   Cellularity from 40 - 50%  Patchy mild increase in reticulin; no increase in collagen; no fibrosis  No monoclonal B or T cell populations  IPSET 2: High risk (age, JAK2)   CV Risk  LDL: 110  HgbA1c: 5.3  BP: Borderline  Tx:   Hydrea  (DC'd due to inc MPN Sx  Ruxolitnib (DC'd due to anemia)  Momelotinib     ASSESSMENT:   Christy Gramajo is a 68 y.o. 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  At the time of her diagnosis, she had no circulating immature or CD34+ cells, no increase in fibrosis, a normal LDH, and no palpable splenomegaly.      She was transitioned from hydroxyurea  to ruxolitinib  due to increased MPN related sx.  This change reduced her MPN symptom score initially from 48 to 21.  Unfortunately, she developed anemia.  As a result, she was switched to momalotinib.  This did raise her hemoglobin, but she developed significant neuropathy,  Thus, she was switched to IFN.      Her JAK2 VAF is rather low.  I am hoping that the use of IFN can put her into a molecular remission.  If so, she may be able to come off of treatment altogether.  The change to IFN did make her MPN symptoms worse.  However, she has accommodating to the drug. A portion of this improvement may be due to repleting her iron  stores.  In any case, I do not anticipate the need to restart her ruxolitinib .      Her LDL cholesterol is < 70, which is ideal.     PLAN:  1) Continue your IFN   2) Return in 3 months.   3) Continue ferrous sulfate  325 mg three times per week   4) Consider supplementing with whey protein (25 - 30 g) after resistance exercise.    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  04/2016: Began Hydrea  at 500 mg every day; increased to 1000 mg  05/2016: BM biopsy   Increased atypical megakaryocytes with clustering  No fibrosis, 2% blasts  JAK2 mutation (VAF 3.5%)  Nl karyotype   Cellularity from 40 - 50%  Patchy mild increase in reticulin; no increase in collagen  No monoclonal B or T cell populations    03/15/19: Seen at [facility]  CBC: 7.3/12.0/312  LDH: WNL  CD34: 0.02  VWF: Normal   MPN score: 48 to 49  Began ruxolitinib  10 mg BID    04/05/19  CBC: 7.3/12.0/312  MPN score: 36 - 38  Ruxolitinib  10 mg BID; Hydrea  was stopped     05/17/19: Ruxolitinib  20 mg BID  LDH: 1.30 x ULN  MPN Score: 27    05/31/19: Ruxolitinib  15 mg BID (decrease due to anemia of 10.6)    07/12/19: Ruxolitinib  10 mg BID (decrease due to anemia of 10.4, plts 477); added Hydrea  500 mg a day   CD34: 0.12  LDH: 1.42 x ULN  MPN Score: 32    10/25/19: Ruxolitinib  5 mg BID (continued anemia); Hydrea  at  500 mg   LDH: 1.29 x ULN  MPN Score: 31    03/01/19:  Ruxolitinib  5 mg BID; Hydrea  at 500 mg   LDH: 1.39 x ULN  MPN Score: 23    05/09/20:  Ruxolitinib  5mg  qAM and 10mg  qPM;  Stopped hydrea     06/12/20: Ruxolitinib  5mg  qAM and 10mg  qPM;  LDH 1.39 x ULN  MPN score: 34 but reports feeling better than last visit even with higher score.    08/29/20: 4.8/11.0/431  02/27/21: 6.2/11.4/477; Ruxolitinib  10 mg BID  MPN: 21  Xol: 159  LDH: 1.17 x ULN  07/13/22: Neuropsychometric assessment is c/w subcortical pattern of cognitive impairment, not consistent with Alzheimer's; c/w severe cerebrovascular disease and NPH  09/09/21: 5.4/10.7/495  12/2022: Shunt placed.   03/11/22: 8.8/11.9/659;   CD34: 13; B12 634; LDH 337;   HDL: 58; Non-HDL: 132  05/11/22: Momelotinib 200 mg QD  05/29/22: 4.6/10.2/555  HgbA1c: 5.3    08/24/22: 6.1/10.5/473; MPN Score: 24  12/08/22: 4.9/10.7/504; momelotinib DC'd due to peripheral neuropathy   Variants of Known/Likely Clinical Significance:   Gene Coding Predicted Protein Variant allele fraction   JAK2 c.1849G>T p.(Val617Phe) [V617F] 4.2%     12/24/22: Began interferon  03/02/23: 5.6/10.4/411  LDH: 224  05/24/24: 4.9/10.5/400;   Ferritin: 9.3  CD34: 0.06  Began ferrous sulfate .   08/24/23: 5.4/11.7/399  Ferritin: 17.4  LDL: 53.0  LDH: 255  History of Present Illness  Christy Buchanan is a 68 year old female who presents with recent weight loss and decreased appetite.    She has experienced a weight loss to 113 pounds, and she attributes this to the heat and decreased appetite. She does not have central air conditioning in her home, and she reports that it is very warm inside during hot weather. She feels full quickly when eating and primarily consumes yogurt, salads, and occasionally meals like ground [place] with pasta and tomatoes. Despite the weight loss, she feels okay overall but is concerned about the weight drop.    She is currently taking Latura, a new medication prescribed by her psychiatrist, which she feels has helped her feel more balanced. She has a history of bipolar type II disorder and obsessive-compulsive disorder (OCD), which affects her stress management. She manages stress related to her work and personal life.    She takes iron  supplements every two days. Her most recent hemoglobin was 11.7 g/dL. She also takes liquid vitamin D. She mentions being a 'hard stick' for blood draws, complicating frequent lab work.    Socially, she lives alone with two cats and has a daughter, Duwaine, who lives nearby. She is involved in a Buddhist group and has reconnected with a friend from her teenage years, which she finds fulfilling. She engages in art activities and plans to attend a potluck with her art group next month.      MPN 10 Score  Symptom (Absent 0 to 10 worst imaginable) 2/21 2/21 4/21 6/21 9/21  1/22  5/22 1/23 8/23 1/24 4/24 7/24 10/24 03/02/23 05/25/23   Fatigue in the past 24 hours 6 9 7 9 7  8 6  5 5 5 5 3 5  6-7  7-8  Even outside for an hour    Filling up quickly when you eat (Early satiety)  7 7 5 5 5 5 4   0 5 5 2  2-3 2-3  - 3 - 4  3-4   Abdominal discomfort  3 4 4 4 4 5   4  2  0 6 4 - 5 2   0 2 0 Gassy    Inactivity 5 5 - 6 5 7 5 4  4  4 2  3 6  5 5 5  Three times per week exercise - getting outside 1 hour can't mow yard 6   Problems with concentration - 6 7-8 6 7 6 2  5  3 6 6  6-7 4 6 6  Clarity is better - 4-5   Numbness/ Tingling (in my hands and feet) 5 to 6 0 0 0 0 0  0  0 0 0 0 3 7  9  Feet - are bad - off balace   fingers are annoying   Usign 9   Night sweats 0 0 1 0 0 0  1 0 4 2-3 0 0 0 2  3    Itching (pruritus) 10 7 0 0 3 3   7 7 7 5 4 1  7 5  Taking antihistamin, 4 -5    Bone pain (diffuse not joint pain or arthritis) 6 6 3  0 1 0  3  0 2 -3 0 4-5  3   0 3 In legs feel sore   3   Fever (>100 F) 0 0 0 0 0 0  0  0 0 0 0 0 0 0 0   Unintentional weight loss last 6 months 0 0 0 0 0 0  0  0 1 - 2 0 0 0 2 0 10 lbs - not hungry   4   TOTAL 48 - 49 36 - 38 27 32 31 27   34 21 32 - 34 32-34 31 - 34 24 34-35 41 43-45     PHYSICAL EXAM:  VS: As recorded in Epic  GENERAL: She appears well   HEENT: OP is clear  LYMPH NODES: No LAN  LUNGS: CTA  COR: RRR  ABD: NTND; no HSM  ZKU:Elodzd are intact; no edema  NEURO: Gait is slightly wide; fluent speech    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; PR pos, positive margin  L Mastectomy - no malignancy; neg LN  Txed with Surgery, radiation, auto BMT, raloxifene   Negative WU  for 28 genes associated with hereditary cancers  Raloxifene   Sjogren's syndrome  Autoimmune thyroiditis - Txed with methimazole    GERD - Txed with PPI  Mixed hyperlipidemia - Tx with Lovaza , pravastatin      FHx:  Daughter w/ spina bifida, BRCA 1081G>A mutation (w/o cancer)      2016 WHO Diagnostic Criteria for MPNs    ET  Major Criteria  Plts > 450  BM biopsy showing proliferation mainly of the megakaryocyte lineage with increased numbers of enlarged, mature megakaryocytes with hyperlobulated nuclei. No significant left-shift of neutrophil granulopoiesis or erythropoiesis and very rarely minor (grade 1) increase in reticulin fibers  Not meeting WHO criteria for BCR-ABL1?+?CML, PV, PMF, MDS, or other myeloid neoplasms)  Presence of JAK2, CALR or MPL mutation    Minor Criteria  Presence of a clonal marker (e.g., abnormal karyotype)  Absence of evidence for reactive thrombocytosis    Diagnosis: All major criteria or first three major and one minor     PMF  Major criteria  Proliferation and atypia of megakaryocytes accompanied by either reticulin and/or collagen fibrosis  grades 2 or 3 on a scale of 0 to 3  Not meeting WHO criteria for ET, PV, BCR-ABL1+ CML, myelodysplastic syndromes, or other myeloid neoplasm  Presence  of JAK2, CALR or MPL mutation or in the absence of these mutations, presence of another clonal marker or absence of reactive myelofibrosis    Minor Criteria  Anemia not attributed to another comorbid condition  WBC > 11.0  Palpable Splenomegaly   LDH > ULN  Leukoerythroblastosis     Diagnosis: 3 major and at least 1 minor confirmed in two consecutive determinations    IPSET Model for Prediction of Thrombosis    Age:    2   < 60: 0   > 60: 2  WBC count (x10e9)  0   < 11: 0   > 11: 1  Hx of Thrombosis  0   No: 0   Yes: 1  --------------------------------------------  Total:    2     10 yr thrombosis risk, median survival (yrs)  Lo Risk: 0 pts    --> 89%, NR  Int Risk: 1-2 pts --> 86%, 24.5  Hi Risk: 3-4 pts  --> 69%, 14.7    IPSET II  Age:    1   < 60: 0   > 60: 1  CVS Risk Factor*  0   No: 0   Yes: 1   Hx of Thrombosis  0   No: 0   Yes: 2  JAK2     2   No: 0   Yes: 2  --------------------------------------------  Total:    3    *Gave up smoking 10 years ago, near optimal non-LDL on treatment, non HTN    Annual Thrombosis Risk  Lo Risk: 0-1 pts  --> 1.03%  Int Risk: 2 pts     --> 2.35%  Hi Risk: 3-6 pts  --> 3.56%     NOTE  JAK2 conveys a higher risk of thrombosis  CALR conveys a higher risk of bleeding    Newer Model:  [email or web address]    Primary prophylaxis of thrombosis  All patients: Aspirin unless    VWF activity <30%,   Platelet >?1 million,   CALR-mutated low-risk ET    PV with Hct >?45%: Add phlebotomy/cytoreduction to target Hct <?45%  Age >?60 years and/or prior history of thrombosis: Add cytoreduction  Secondary prophylaxis after thrombotic event  All patients: Cytoreduction  Typical VTE: Consider indefinite VKA for most patients; aspirin if not on VKA  Atypical VTE: Indefinite VKA  Arterial thrombosis: Aspirin

## 2023-08-24 NOTE — Unmapped (Addendum)
 PLAN:  1) Continue your IFN   2) Return in 3 months.   3) Continue the iron      Overall, you are doing great.  These numbers are perfect.  It would not surprise me if we need to reduce this at some point.     Supplement: This group used whey protein (25 - 30 g) after resistance exercise.    Optimum comes in a lot of flavors.     From a blood standpoint, you are doing well.     If you become more anemic, we would spread out the interval      Vitamin D: 400 to 800 international units     All lab results last 24 hours:    Recent Results (from the past 24 hours)   Reticulocytes    Collection Time: 08/24/23 12:34 PM   Result Value Ref Range    Reticulocyte Auto % 0.82 0.50 - 2.17 %    Absolute Auto Reticulocyte 34.2 23.0 - 100.0 10*9/L   CBC w/ Differential    Collection Time: 08/24/23 12:34 PM   Result Value Ref Range    WBC 5.4 3.6 - 11.2 10*9/L    RBC 4.19 3.95 - 5.13 10*12/L    HGB 11.7 11.3 - 14.9 g/dL    HCT 65.8 65.9 - 55.9 %    MCV 81.4 77.6 - 95.7 fL    MCH 27.9 25.9 - 32.4 pg    MCHC 34.2 32.0 - 36.0 g/dL    RDW 83.6 (H) 87.7 - 15.2 %    MPV 7.8 6.8 - 10.7 fL    Platelet 399 150 - 450 10*9/L    Neutrophils % 65.0 %    Lymphocytes % 21.6 %    Monocytes % 10.3 %    Eosinophils % 1.9 %    Basophils % 1.2 %    Absolute Neutrophils 3.5 1.8 - 7.8 10*9/L    Absolute Lymphocytes 1.2 1.1 - 3.6 10*9/L    Absolute Monocytes 0.6 0.3 - 0.8 10*9/L    Absolute Eosinophils 0.1 0.0 - 0.5 10*9/L    Absolute Basophils 0.1 0.0 - 0.1 10*9/L    Anisocytosis Slight (A) Not Present     All lab results last 24 hours:    Recent Results (from the past 24 hours)   Reticulocytes    Collection Time: 08/24/23 12:34 PM   Result Value Ref Range    Reticulocyte Auto % 0.82 0.50 - 2.17 %    Absolute Auto Reticulocyte 34.2 23.0 - 100.0 10*9/L   Lactate dehydrogenase    Collection Time: 08/24/23 12:34 PM   Result Value Ref Range    LDH 255 (H) 120 - 246 U/L   Comprehensive Metabolic Panel    Collection Time: 08/24/23 12:34 PM   Result Value Ref Range    Sodium 141 135 - 145 mmol/L    Potassium 3.8 3.4 - 4.8 mmol/L    Chloride 107 98 - 107 mmol/L    CO2 24.0 20.0 - 31.0 mmol/L    Anion Gap 10 5 - 14 mmol/L    BUN 14 9 - 23 mg/dL    Creatinine 9.15 9.44 - 1.02 mg/dL    BUN/Creatinine Ratio 17     eGFR CKD-EPI (2021) Female 75 >=60 mL/min/1.15m2    Glucose 92 70 - 179 mg/dL    Calcium 9.4 8.7 - 89.5 mg/dL    Albumin 4.0 3.4 - 5.0 g/dL    Total Protein 7.4 5.7 - 8.2 g/dL  Total Bilirubin 0.4 0.3 - 1.2 mg/dL    AST 27 <=65 U/L    ALT 25 10 - 49 U/L    Alkaline Phosphatase 149 (H) 46 - 116 U/L   LDL Cholesterol, Direct    Collection Time: 08/24/23 12:34 PM   Result Value Ref Range    LDL Direct 53.0 mg/dL We want < 70   CBC w/ Differential    Collection Time: 08/24/23 12:34 PM   Result Value Ref Range    WBC 5.4 3.6 - 11.2 10*9/L    RBC 4.19 3.95 - 5.13 10*12/L    HGB 11.7 11.3 - 14.9 g/dL    HCT 65.8 65.9 - 55.9 %    MCV 81.4 77.6 - 95.7 fL    MCH 27.9 25.9 - 32.4 pg    MCHC 34.2 32.0 - 36.0 g/dL    RDW 83.6 (H) 87.7 - 15.2 %    MPV 7.8 6.8 - 10.7 fL    Platelet 399 150 - 450 10*9/L    Neutrophils % 65.0 %    Lymphocytes % 21.6 %    Monocytes % 10.3 %    Eosinophils % 1.9 %    Basophils % 1.2 %    Absolute Neutrophils 3.5 1.8 - 7.8 10*9/L    Absolute Lymphocytes 1.2 1.1 - 3.6 10*9/L    Absolute Monocytes 0.6 0.3 - 0.8 10*9/L    Absolute Eosinophils 0.1 0.0 - 0.5 10*9/L    Absolute Basophils 0.1 0.0 - 0.1 10*9/L    Anisocytosis Slight (A) Not Present

## 2023-08-30 DIAGNOSIS — D473 Essential (hemorrhagic) thrombocythemia: Principal | ICD-10-CM

## 2023-09-03 NOTE — Unmapped (Signed)
 [facility] Refill Coordination Note    Christy Buchanan, DOB: 1955/06/02  Phone: [phone] (home)       All above HIPAA information was verified with patient.         09/02/2023    12:04 PM   Specialty Rx Medication Refill Questionnaire   Which Medications would you like refilled and shipped? Pegasys  .25 ml(73mcg) every  14 days, I have one dose left - taking tomorrow July 25th   If medication refills are not needed at this time, please indicate the reason below. n/a   Please list all current allergies: NKA   Have you missed any doses in the last 30 days? No   Have you had any changes to your medication(s) since your last refill? No   How much of each medication do you have remaining at home? (eg. number of tablets, injections, etc.) 1 dose for July 25th 2025   If receiving an injectable medication, next injection date is 09/03/2023   Have you experienced any side effects in the last 30 days? No   Please enter the full address (street address, city, state, zip code) where you would like your medication(s) to be delivered to. [address] [place], [place] [address]   Please specify on which day you would like your medication(s) to arrive. Note: if you need your medication(s) within 3 days, please call the pharmacy to schedule your order at [phone]  09/16/2023   Has your insurance changed since your last refill? No   Would you like a pharmacist to call you to discuss your medication(s)? No   Do you require a signature for your package? (Note: if we are billing Medicare Part B or your order contains a controlled substance, we will require a signature) No   I have been provided my out of pocket cost for my medication and approve the pharmacy to charge the amount to my credit card on file. Yes         Completed refill call assessment today to schedule patient's medication shipment from the [facility] [phone]).  All relevant notes have been reviewed.       Confirmed patient received a Conservation officer, historic buildings and a Surveyor, mining with first shipment. The patient will receive a drug information handout for each medication shipped and additional FDA Medication Guides as required.         REFERRAL TO PHARMACIST     Referral to the pharmacist: Not needed      [facility]     Shipping address confirmed in Epic.     Delivery Scheduled: Yes, Expected medication delivery date: 09/16/23.     Medication will be delivered via Same Day Courier to the prescription address in Epic WAM.    Benetta Maclaren   [facility] Delivery Pharm[facility] Specialty Technician

## 2023-09-13 DIAGNOSIS — D473 Essential (hemorrhagic) thrombocythemia: Principal | ICD-10-CM

## 2023-09-16 MED FILL — PEGASYS 180 MCG/ML SUBCUTANEOUS SOLUTION: SUBCUTANEOUS | 28 days supply | Qty: 2 | Fill #5

## 2023-09-20 DIAGNOSIS — K219 Gastro-esophageal reflux disease without esophagitis: Principal | ICD-10-CM

## 2023-09-20 MED ORDER — OMEPRAZOLE 20 MG CAPSULE,DELAYED RELEASE
ORAL_CAPSULE | Freq: Every day | ORAL | 3 refills | 90.00000 days | Status: CP
Start: 2023-09-20 — End: ?

## 2023-09-21 NOTE — Unmapped (Addendum)
 Hi Dr. Daaleman,    There is a form in your box requiring your signature     [employer] form faxed to [phone]

## 2023-09-23 NOTE — Unmapped (Signed)
 You have a fax in your box requiring your signature.

## 2023-09-23 NOTE — Unmapped (Signed)
 Form faxed to [phone]

## 2023-09-24 MED ORDER — ATORVASTATIN 80 MG TABLET
ORAL_TABLET | Freq: Every day | ORAL | 3 refills | 90.00000 days
Start: 2023-09-24 — End: ?

## 2023-09-27 DIAGNOSIS — D473 Essential (hemorrhagic) thrombocythemia: Principal | ICD-10-CM

## 2023-10-04 NOTE — Unmapped (Signed)
 [facility] Delivery Pharm[facility] Refill Coordination Note    Christy Buchanan, DOB: 12/20/1955  Phone: [phone] (home)       All above HIPAA information was verified with patient.         10/01/2023     4:15 PM   Specialty Rx Medication Refill Questionnaire   Which Medications would you like refilled and shipped? I have 0ne more dose to take today and then no more doses until I am due again in two weeks.   Please list all current allergies: NK   Have you missed any doses in the last 30 days? No   Have you had any changes to your medication(s) since your last refill? No   How much of each medication do you have remaining at home? (eg. number of tablets, injections, etc.) One injection for today August 22nd   If receiving an injectable medication, next injection date is 10/01/2023   Have you experienced any side effects in the last 30 days? No   Please enter the full address (street address, city, state, zip code) where you would like your medication(s) to be delivered to. [address], [place] [address]   Please specify on which day you would like your medication(s) to arrive. Note: if you need your medication(s) within 3 days, please call the pharmacy to schedule your order at [phone]  10/14/2023   Has your insurance changed since your last refill? No   Would you like a pharmacist to call you to discuss your medication(s)? No   Do you require a signature for your package? (Note: if we are billing Medicare Part B or your order contains a controlled substance, we will require a signature) No   I have been provided my out of pocket cost for my medication and approve the pharmacy to charge the amount to my credit card on file. Yes   Additional Comments: Thank you!         Completed refill call assessment today to schedule patient's medication shipment from the [facility] [phone]).  All relevant notes have been reviewed.       Confirmed patient received a Conservation officer, historic buildings and a Surveyor, mining with first shipment. The patient will receive a drug information handout for each medication shipped and additional FDA Medication Guides as required.         REFERRAL TO PHARMACIST     Referral to the pharmacist: Not needed      [facility]     Shipping address confirmed in Epic.     Delivery Scheduled: Yes, Expected medication delivery date: 10/14/23.     Medication will be delivered via Same Day Courier to the prescription address in Epic WAM.    Sandra Tellefsen   [facility] Special[facility] and Home Delivery Pharmacy Specialty Technician

## 2023-10-11 DIAGNOSIS — D473 Essential (hemorrhagic) thrombocythemia: Principal | ICD-10-CM

## 2023-10-14 MED FILL — PEGASYS 180 MCG/ML SUBCUTANEOUS SOLUTION: SUBCUTANEOUS | 28 days supply | Qty: 2 | Fill #6

## 2023-10-21 ENCOUNTER — Encounter: Payer: Self-pay | Admitting: *Deleted

## 2023-10-25 DIAGNOSIS — D473 Essential (hemorrhagic) thrombocythemia: Principal | ICD-10-CM

## 2023-10-26 DIAGNOSIS — Z853 Personal history of malignant neoplasm of breast: Principal | ICD-10-CM

## 2023-10-26 MED ORDER — RALOXIFENE 60 MG TABLET
ORAL_TABLET | Freq: Every day | ORAL | 3 refills | 90.00000 days | Status: CP
Start: 2023-10-26 — End: ?

## 2023-10-28 MED ORDER — ATORVASTATIN 80 MG TABLET
ORAL_TABLET | Freq: Every day | ORAL | 3 refills | 90.00000 days | Status: CP
Start: 2023-10-28 — End: 2024-01-26

## 2023-10-28 MED ORDER — CITALOPRAM 10 MG TABLET
ORAL_TABLET | Freq: Three times a day (TID) | ORAL | 10 refills | 30.00000 days | Status: CP
Start: 2023-10-28 — End: 2023-10-28

## 2023-10-28 MED ORDER — OMEPRAZOLE 20 MG CAPSULE,DELAYED RELEASE
ORAL_CAPSULE | Freq: Every day | ORAL | 3 refills | 90.00000 days | Status: CP
Start: 2023-10-28 — End: ?

## 2023-10-28 NOTE — Unmapped (Addendum)
 LDL at goal on recent labs. Continue current management  Orders:    atorvastatin  (LIPITOR ) 80 MG tablet; Take 1 tablet (80 mg total) by mouth daily.

## 2023-10-28 NOTE — Unmapped (Signed)
 I saw and evaluated the patient, participating in the key portions of the service.  I reviewed the resident???s note.  I agree with the resident???s findings and plan. Ellery Plunk, MD

## 2023-10-28 NOTE — Unmapped (Signed)
 [facility]    ASSESSMENT/PLAN     Assessment & Plan  Depression, major, single episode, complete remission  Stable, continue current regimen.       Orders:    citalopram  (CELEXA ) 10 MG tablet; Take 1 tablet (10 mg total) by mouth Three (3) times a day before meals.    Gastroesophageal reflux disease without esophagitis    Orders:    omeprazole  (PRILOSEC) 20 MG capsule; Take 1 capsule (20 mg total) by mouth daily.    Routine general medical examination at a health care facility  Here for annual exam.  -Immunizations: flu today. Previously had Zostavas and pneumococcal 23-valent vaccines  -Labs: UTD, checked by hematology including lipids  -Cancer screenings: Colonoscopy upcoming, already scheduled. Mammogram ordered. Done with cervical cancer screening.   -DEXA: last 6 years ago, will obtain a new one today. Continues on raloxifene  for osteoporosis  Orders:    INFLUENZA ADJUVANTED PF, IIV3(89YR UP)(FLUAD )    Mixed hyperlipidemia  LDL at goal on recent labs. Continue current management  Orders:    atorvastatin  (LIPITOR ) 80 MG tablet; Take 1 tablet (80 mg total) by mouth daily.    Postmenopausal osteoporosis    Orders:    Dexa Bone Density Skeletal; Future    Encounter for screening mammogram for malignant neoplasm of breast    Orders:    Mammo Screening Bilateral; Future    Encounter for immunization    Orders:    INFLUENZA ADJUVANTED PF, IIV3(89YR UP)(FLUAD )    NPH (normal pressure hydrocephalus)    (CMS-HCC)  Doing much better with normal gait, improved mentation since VP shunt placement. No changes to management.                   Follow up in 1 year for annual exam, or sooner if needed.         SUBJECTIVE     Chief Complaint   Patient presents with    Annual Exam    Medication Refill    Flu Vaccine       Christy Buchanan is a 68 y.o. female who presents to clinic today for the following issues:    Annual exam: here with no concerns today. Has been doing well overall.   Needs medication refills  Labs checked frequently with hematology. Doing well w/r/t NPH since VP shunt placement        Short Social History[1]    Problem List             Diagnosed      Family history of colon cancer       Family history of thyroid cancer       Acid reflux (Chronic)       History of breast cancer       Mixed hyperlipidemia (Chronic)       Osteoporosis (Chronic)       High cholesterol       Sjogrens syndrome (Chronic)       Vitamin D deficiency       Too many blood platelets (Chronic)       Acquired underactive thyroid       JAK-2 gene mutation (Chronic)       MPN (myeloproliferative neoplasm) (Chronic)       Water on the brain       Red blood cell antibody positive       Presence of ventricular shunt (Chronic)  Medications:  acetaminophen   amoxicillin  atorvastatin   cetirizine  citalopram   empty container Misc  ferrous sulfate   Lactobacillus rhamnosus GG  lurasidone Tab  multivitamin  naproxen sodium Cap  omega-3 fatty acids -fish oil  Cap  omeprazole   PEGASYS  Soln  raloxifene   simethicone  syringe Syrg  vitamins B1 B6 B12 Liqd     OBJECTIVE   BP 114/64 (BP Position: Sitting, BP Cuff Size: Small)  - Pulse 78  - Temp 36.3 ??C (97.3 ??F) (Temporal)  - Wt 51.7 kg (114 lb)  - BMI 19.88 kg/m??     General: Well-appearing, in no acute distress  HENT: Normocephalic, atraumatic, moist mucous membranes  Neck: No visible lesions or masses  Cardiovascular: RRR, no m/r/g  Lungs: Normal work of breathing. Lungs clear to auscultation bilaterally  Abdominal: Non-distended  Skin: Well-perfused, no evident lesions  Extremities: No visible edema  Neuro: No focal neurological deficits, normal gait  Psych: Full affect     Gladis Curry, MD  Resident Physician, PGY-2  [facility]        [facility]  at [facility]  [address], [place], [place] [address]   Telephone [phone]  Fax [phone]  [email or web address]          [1]   Social History  Tobacco Use    Smoking status: Former     Types: Cigarettes    Smokeless tobacco: Never   Vaping Use    Vaping status: Never Used   Substance Use Topics    Alcohol use: Yes     Comment: rarely    Drug use: Not Currently

## 2023-10-28 NOTE — Unmapped (Signed)
 Doing much better with normal gait, improved mentation since VP shunt placement. No changes to management.

## 2023-10-28 NOTE — Unmapped (Addendum)
 Orders:    omeprazole (PRILOSEC) 20 MG capsule; Take 1 capsule (20 mg total) by mouth daily.

## 2023-10-28 NOTE — Unmapped (Addendum)
 Orders:    Dexa Bone Density Skeletal; Future

## 2023-10-28 NOTE — Unmapped (Addendum)
 Stable, continue current regimen.       Orders:    citalopram  (CELEXA ) 10 MG tablet; Take 1 tablet (10 mg total) by mouth Three (3) times a day before meals.

## 2023-11-03 NOTE — Unmapped (Signed)
 [facility] Delivery Pharm[facility] Refill Coordination Note    Christy Buchanan, DOB: [birth year]  Phone: [phone] (home)       All above HIPAA information was verified with patient.         11/02/2023     5:00 PM   Specialty Rx Medication Refill Questionnaire   Which Medications would you like refilled and shipped? Pegasys  , I do not have any on hand, last dose was on September 19th 2025   Please list all current allergies: NKA   Have you missed any doses in the last 30 days? No   Have you had any changes to your medication(s) since your last refill? No   How much of each medication do you have remaining at home? (eg. number of tablets, injections, etc.) None currently   If receiving an injectable medication, next injection date is 11/12/2023   Have you experienced any side effects in the last 30 days? No   Please enter the full address (street address, city, state, zip code) where you would like your medication(s) to be delivered to. [address] [place] [address]   Please specify on which day you would like your medication(s) to arrive. Note: if you need your medication(s) within 3 days, please call the pharmacy to schedule your order at [phone]  11/11/2023   Has your insurance changed since your last refill? No   Would you like a pharmacist to call you to discuss your medication(s)? No   Do you require a signature for your package? (Note: if we are billing Medicare Part B or your order contains a controlled substance, we will require a signature) No   I have been provided my out of pocket cost for my medication and approve the pharmacy to charge the amount to my credit card on file. Yes   Additional Comments: I will also need a supply of injectable needles as I only have two left.         Completed refill call assessment today to schedule patient's medication shipment from the [facility] [phone]).  All relevant notes have been reviewed.       Confirmed patient received a Conservation officer, historic buildings and a Surveyor, mining with first shipment. The patient will receive a drug information handout for each medication shipped and additional FDA Medication Guides as required.         REFERRAL TO PHARMACIST     Referral to the pharmacist: Not needed      [facility]     Shipping address confirmed in Epic.     Delivery Scheduled: Yes, Expected medication delivery date: 11/11/2023.     Medication will be delivered via Same Day Courier to the prescription address in Epic WAM.    Christy Buchanan   [facility] Special[facility] and Home Delivery Pharmacy Specialty Technician

## 2023-11-08 DIAGNOSIS — D473 Essential (hemorrhagic) thrombocythemia: Principal | ICD-10-CM

## 2023-11-16 ENCOUNTER — Encounter
Admission: RE | Disposition: A | Discharge status: Home / Self Care | Payer: Self-pay | Source: Home / Self Care | Attending: Gastroenterology

## 2023-11-16 ENCOUNTER — Ambulatory Visit: Payer: Self-pay

## 2023-11-16 ENCOUNTER — Ambulatory Visit
Admission: RE | Admit: 2023-11-16 | Discharge: 2023-11-16 | Disposition: A | Discharge status: Home / Self Care | Attending: Gastroenterology | Admitting: Gastroenterology

## 2023-11-16 ENCOUNTER — Encounter

## 2023-11-16 DIAGNOSIS — K449 Diaphragmatic hernia without obstruction or gangrene: Secondary | ICD-10-CM | POA: Diagnosis not present

## 2023-11-16 DIAGNOSIS — K219 Gastro-esophageal reflux disease without esophagitis: Secondary | ICD-10-CM | POA: Insufficient documentation

## 2023-11-16 DIAGNOSIS — M810 Age-related osteoporosis without current pathological fracture: Secondary | ICD-10-CM | POA: Insufficient documentation

## 2023-11-16 DIAGNOSIS — Z79899 Other long term (current) drug therapy: Secondary | ICD-10-CM | POA: Diagnosis not present

## 2023-11-16 DIAGNOSIS — I341 Nonrheumatic mitral (valve) prolapse: Secondary | ICD-10-CM | POA: Diagnosis not present

## 2023-11-16 DIAGNOSIS — Z800 Family history of malignant neoplasm of digestive organs: Secondary | ICD-10-CM | POA: Diagnosis not present

## 2023-11-16 DIAGNOSIS — E0500 Thyrotoxicosis with diffuse goiter without thyrotoxic crisis or storm: Secondary | ICD-10-CM | POA: Diagnosis not present

## 2023-11-16 DIAGNOSIS — D509 Iron deficiency anemia, unspecified: Secondary | ICD-10-CM | POA: Insufficient documentation

## 2023-11-16 DIAGNOSIS — F419 Anxiety disorder, unspecified: Secondary | ICD-10-CM | POA: Insufficient documentation

## 2023-11-16 DIAGNOSIS — Z87891 Personal history of nicotine dependence: Secondary | ICD-10-CM | POA: Diagnosis not present

## 2023-11-16 DIAGNOSIS — K5900 Constipation, unspecified: Secondary | ICD-10-CM | POA: Diagnosis not present

## 2023-11-16 DIAGNOSIS — F32A Depression, unspecified: Secondary | ICD-10-CM | POA: Insufficient documentation

## 2023-11-16 HISTORY — PX: COLONOSCOPY: SHX5424

## 2023-11-16 HISTORY — PX: ESOPHAGOGASTRODUODENOSCOPY: SHX5428

## 2023-11-16 SURGERY — COLONOSCOPY
Anesthesia: General

## 2023-11-16 MED ORDER — LIDOCAINE HCL (PF) 2 % IJ SOLN
INTRAMUSCULAR | Status: AC
  Filled 2023-11-16: qty 5

## 2023-11-16 MED ORDER — SODIUM CHLORIDE 0.9 % IV SOLN: INTRAVENOUS | Status: DC

## 2023-11-16 MED ORDER — PROPOFOL 500 MG/50ML IV EMUL
INTRAVENOUS | Status: DC | PRN
  Administered 2023-11-16: 50 ug/kg/min via INTRAVENOUS

## 2023-11-16 MED ORDER — DEXMEDETOMIDINE HCL IN NACL 80 MCG/20ML IV SOLN
INTRAVENOUS | Status: DC | PRN
Start: 2023-11-16 — End: 2023-11-16
  Administered 2023-11-16: 4 ug via INTRAVENOUS
  Administered 2023-11-16: 8 ug via INTRAVENOUS
  Administered 2023-11-16 (×2): 4 ug via INTRAVENOUS

## 2023-11-16 MED ORDER — LIDOCAINE HCL (CARDIAC) PF 100 MG/5ML IV SOSY
PREFILLED_SYRINGE | INTRAVENOUS | Status: DC | PRN
  Administered 2023-11-16: 50 mg via INTRAVENOUS

## 2023-11-16 MED ORDER — PROPOFOL 10 MG/ML IV BOLUS
INTRAVENOUS | Status: DC | PRN
  Administered 2023-11-16: 20 mg via INTRAVENOUS
  Administered 2023-11-16: 40 mg via INTRAVENOUS

## 2023-11-16 NOTE — Op Note (Signed)
 [facility] Patient Name: Karen [name] Procedure Date: 11/16/2023 9:12 AM MRN: [id number] Account #: [id number] Date of Birth: [birth year] Admit Type: Outpatient Age: 68 Room: [facility] ENDO ROOM 1 Gender: Female Note Status: Finalized Instrument Name: Colon Scope [id number] Procedure:             Colonoscopy Indications:           Iron deficiency anemia, Family history of colon cancer                         in a first-degree relative before age 86 years,                         Incidental constipation noted Providers:             Ole Schick MD, MD Referring MD:          No Local Md, MD (Referring MD) Medicines:             Monitored Anesthesia Care Complications:         No immediate complications. Procedure:             Pre-Anesthesia Assessment:                        - Prior to the procedure, a History and Physical was                         performed, and patient medications and allergies were                         reviewed. The patient is competent. The risks and                         benefits of the procedure and the sedation options and                         risks were discussed with the patient. All questions                         were answered and informed consent was obtained.                         Patient identification and proposed procedure were                         verified by the physician, the nurse, the                         anesthesiologist, the anesthetist and the technician                         in the endoscopy suite. Mental Status Examination:                         alert and oriented. Airway Examination: normal                         oropharyngeal airway and neck mobility. Respiratory  Examination: clear to auscultation. CV Examination:                         normal. Prophylactic Antibiotics: The patient does not                         require prophylactic antibiotics. Prior                          Anticoagulants: The patient has taken no anticoagulant                         or antiplatelet agents. ASA Grade Assessment: II - A                         patient with mild systemic disease. After reviewing                         the risks and benefits, the patient was deemed in                         satisfactory condition to undergo the procedure. The                         anesthesia plan was to use monitored anesthesia care                         (MAC). Immediately prior to administration of                         medications, the patient was re-assessed for adequacy                         to receive sedatives. The heart rate, respiratory                         rate, oxygen saturations, blood pressure, adequacy of                         pulmonary ventilation, and response to care were                         monitored throughout the procedure. The physical                         status of the patient was re-assessed after the                         procedure.                        After obtaining informed consent, the colonoscope was                         passed under direct vision. Throughout the procedure,                         the patient's blood pressure, pulse, and oxygen  saturations were monitored continuously. The                         Colonoscope was introduced through the anus and                         advanced to the the terminal ileum, with                         identification of the appendiceal orifice and IC                         valve. The colonoscopy was performed without                         difficulty. The patient tolerated the procedure well.                         The quality of the bowel preparation was good. The                         terminal ileum, ileocecal valve, appendiceal orifice,                         and rectum were photographed. Findings:      The perianal and digital rectal  examinations were normal.      The terminal ileum appeared normal.      The entire examined colon appeared normal on direct and retroflexion       views. Impression:            - The examined portion of the ileum was normal.                        - The entire examined colon is normal on direct and                         retroflexion views.                        - No specimens collected. Recommendation:        - Discharge patient to home.                        - Resume previous diet.                        - Continue present medications.                        - Repeat colonoscopy in 5 years for screening purposes.                        - Return to referring physician as previously                         scheduled. Procedure Code(s):     --- Professional ---                        [phone], Colonoscopy, flexible; diagnostic, including  collection of specimen(s) by brushing or washing, when                         performed (separate procedure) Diagnosis Code(s):     --- Professional ---                        D50.9, Iron deficiency anemia, unspecified                        Z80.0, Family history of malignant neoplasm of                         digestive organs CPT copyright 2022 American Medical Association. All rights reserved. The codes documented in this report are preliminary and upon coder review may  be revised to meet current compliance requirements. Ole Schick MD, MD 11/16/2023 9:54:33 AM Number of Addenda: 0 Note Initiated On: 11/16/2023 9:12 AM Estimated Blood Loss:  Estimated blood loss: none.      [facility]

## 2023-11-16 NOTE — Transfer of Care (Signed)
 Immediate Anesthesia Transfer of Care Note  Patient: Karen [name]  Procedure(s) Performed: COLONOSCOPY EGD (ESOPHAGOGASTRODUODENOSCOPY)  Patient Location: PACU  Anesthesia Type:General  Level of Consciousness: sedated  Airway & Oxygen Therapy: Patient Spontanous Breathing  Post-op Assessment: Report given to RN and Post -op Vital signs reviewed and stable  Post vital signs: Reviewed and stable  Last Vitals:  Vitals Value Taken Time  BP 91/42 11/16/23 09:46  Temp 36.6 C 11/16/23 09:45  Pulse 66 11/16/23 09:46  Resp 14 11/16/23 09:46  SpO2 100 % 11/16/23 09:46  Vitals shown include unfiled device data.  Last Pain:  Vitals:   11/16/23 0945  TempSrc: Temporal  PainSc: 0-No pain         Complications: No notable events documented.

## 2023-11-16 NOTE — Op Note (Signed)
 [facility] Patient Name: Karen [name] Procedure Date: 11/16/2023 9:14 AM MRN: [id number] Account #: [id number] Date of Birth: Dec 12, 1955 Admit Type: Outpatient Age: 68 Room: [facility] ENDO ROOM 1 Gender: Female Note Status: Finalized Instrument Name: Upper GI Scope [id number] [id number] Procedure:             Upper GI endoscopy Indications:           Iron deficiency anemia Providers:             Ole Schick MD, MD Referring MD:          No Local Md, MD (Referring MD) Medicines:             Monitored Anesthesia Care Complications:         No immediate complications. Estimated blood loss:                         Minimal. Procedure:             Pre-Anesthesia Assessment:                        - Prior to the procedure, a History and Physical was                         performed, and patient medications and allergies were                         reviewed. The patient is competent. The risks and                         benefits of the procedure and the sedation options and                         risks were discussed with the patient. All questions                         were answered and informed consent was obtained.                         Patient identification and proposed procedure were                         verified by the physician, the nurse, the                         anesthesiologist, the anesthetist and the technician                         in the endoscopy suite. Mental Status Examination:                         alert and oriented. Airway Examination: normal                         oropharyngeal airway and neck mobility. Respiratory                         Examination: clear to auscultation. CV Examination:  normal. Prophylactic Antibiotics: The patient does not                         require prophylactic antibiotics. Prior                         Anticoagulants: The patient has taken no anticoagulant                          or antiplatelet agents. ASA Grade Assessment: II - A                         patient with mild systemic disease. After reviewing                         the risks and benefits, the patient was deemed in                         satisfactory condition to undergo the procedure. The                         anesthesia plan was to use monitored anesthesia care                         (MAC). Immediately prior to administration of                         medications, the patient was re-assessed for adequacy                         to receive sedatives. The heart rate, respiratory                         rate, oxygen saturations, blood pressure, adequacy of                         pulmonary ventilation, and response to care were                         monitored throughout the procedure. The physical                         status of the patient was re-assessed after the                         procedure.                        After obtaining informed consent, the endoscope was                         passed under direct vision. Throughout the procedure,                         the patient's blood pressure, pulse, and oxygen                         saturations were monitored continuously. The Endoscope  was introduced through the mouth, and advanced to the                         second part of duodenum. The upper GI endoscopy was                         accomplished without difficulty. The patient tolerated                         the procedure well. Findings:      The examined esophagus was normal.      Localized mild inflammation characterized by erythema was found in the       gastric antrum. Biopsies were taken with a cold forceps for Helicobacter       pylori testing. Estimated blood loss was minimal.      The examined duodenum was normal. Impression:            - Normal esophagus.                        - Gastritis. Biopsied.                        - Normal  examined duodenum. Recommendation:        - Discharge patient to home.                        - Resume previous diet.                        - Continue present medications.                        - Await pathology results.                        - Return to referring physician as previously                         scheduled. Procedure Code(s):     --- Professional ---                        [phone], Esophagogastroduodenoscopy, flexible,                         transoral; with biopsy, single or multiple Diagnosis Code(s):     --- Professional ---                        K29.70, Gastritis, unspecified, without bleeding                        D50.9, Iron deficiency anemia, unspecified CPT copyright 2022 [employer]. All rights reserved. The codes documented in this report are preliminary and upon coder review may  be revised to meet current compliance requirements. Ole Schick MD, MD 11/16/2023 9:50:54 AM Number of Addenda: 0 Note Initiated On: 11/16/2023 9:14 AM Estimated Blood Loss:  Estimated blood loss was minimal.      [facility]

## 2023-11-16 NOTE — H&P (Signed)
 Outpatient short stay form Pre-procedure 11/16/2023  Karen ONEIDA Schick, MD  Primary Physician: Daaleman, Timothy P, MD  Reason for visit:  IDA/Family history of colon cancer  History of present illness:    68 y/o lady with history of graves disease, myeloproliferative neoplasm (Jak2+), mitral valve prolapse, and anxiety here for EGD/Colonoscopy for IDA. Last colonoscopy in 2019 was unremarkable. Father had colon cancer in his 59's. No blood thinners. No significant abdominal surgeries.    Current Facility-Administered Medications:    0.9 %  sodium chloride  infusion, , Intravenous, Continuous, Sama Arauz, Karen ONEIDA, MD, Last Rate: 20 mL/hr at 11/16/23 0846, Continued from Pre-op at 11/16/23 0846  Medications Prior to Admission  Medication Sig Dispense Refill Last Dose/Taking   PARoxetine  (PAXIL ) 20 MG tablet Take 20 mg by mouth daily.   11/15/2023   pravastatin  (PRAVACHOL ) 20 MG tablet Take 20 mg by mouth daily.   11/15/2023   raloxifene  (EVISTA ) 60 MG tablet Take 60 mg by mouth daily.   11/15/2023   aspirin EC 81 MG tablet Take 81 mg by mouth daily.      docusate sodium (COLACE) 100 MG capsule Take 100 mg by mouth daily.      hydroxyurea  (HYDREA ) 500 MG capsule Take 1 capsule (500 mg total) by mouth daily. May take with food to minimize GI side effects. 90 capsule 1    methimazole  (TAPAZOLE ) 10 MG tablet Take 5 mg by mouth daily.       Multiple Vitamin (MULTIVITAMIN) tablet Take 1 tablet by mouth daily.      NON FORMULARY Take 1 mL by mouth daily.      omega-3 acid ethyl esters (LOVAZA) 1 g capsule Take 1 g by mouth daily.      omeprazole  (PRILOSEC) 20 MG capsule Take 20 mg by mouth daily.      ondansetron  (ZOFRAN ) 4 MG tablet Take 1 tablet (4 mg total) by mouth every 8 (eight) hours as needed for nausea or vomiting. 30 tablet 0    Probiotic Product (PROBIOTIC-10) CHEW Chew by mouth.      psyllium (METAMUCIL) 58.6 % packet Take 1 packet by mouth daily as needed.         No Known  Allergies   Past Medical History:  Diagnosis Date   Anemia    Anxiety    Arthritis    Breast cancer (HCC) 1995   left breast cancer - chemo and modified mastectomy   Depression    Dyspnea    Dysrhythmia    Essential thrombocytosis (HCC)    GERD (gastroesophageal reflux disease)    Heart murmur    History of hiatal hernia    Hyperlipidemia    Hyperthyroidism    Mitral valve prolapse 1997   Normal pressure hydrocephalus (HCC) 2022   shunted   Osteoporosis    Personal history of chemotherapy 1995   BREAST CA   Personal history of radiation therapy 1995   BREAST CA   Sjogren's syndrome    Vitamin D deficiency     Review of systems:  Otherwise negative.    Physical Exam  Gen: Alert, oriented. Appears stated age.  HEENT: PERRLA. Lungs: No respiratory distress CV: RRR Abd: soft, benign, no masses Ext: No edema    Planned procedures: Proceed with EGD/colonoscopy. The patient understands the nature of the planned procedure, indications, risks, alternatives and potential complications including but not limited to bleeding, infection, perforation, damage to internal organs and possible oversedation/side effects from anesthesia. The patient agrees and  gives consent to proceed.  Please refer to procedure notes for findings, recommendations and patient disposition/instructions.     Karen ONEIDA Schick, MD [facility]

## 2023-11-16 NOTE — Interval H&P Note (Signed)
 History and Physical Interval Note:  11/16/2023 9:09 AM  Karen [name]  has presented today for surgery, with the diagnosis of IDA.  The various methods of treatment have been discussed with the patient and family. After consideration of risks, benefits and other options for treatment, the patient has consented to  Procedure(s): COLONOSCOPY (N/A) EGD (ESOPHAGOGASTRODUODENOSCOPY) (N/A) as a surgical intervention.  The patient's history has been reviewed, patient examined, no change in status, stable for surgery.  I have reviewed the patient's chart and labs.  Questions were answered to the patient's satisfaction.     Ole ONEIDA Schick  Ok to proceed with EGD/Colonoscopy

## 2023-11-16 NOTE — Anesthesia Preprocedure Evaluation (Signed)
 Anesthesia Evaluation  Patient identified by MRN, date of birth, ID band Patient awake    Reviewed: Allergy & Precautions, H&P , NPO status , Patient's Chart, lab work & pertinent test results, reviewed documented beta blocker date and time   Airway Mallampati: II  TM Distance: >3 FB Neck ROM: full    Dental  (+) Dental Advidsory Given, Partial Upper, Poor Dentition   Pulmonary shortness of breath and with exertion, neg COPD, neg recent URI, former smoker          Cardiovascular Exercise Tolerance: Good (-) hypertension(-) angina (-) CAD, (-) Past MI, (-) Cardiac Stents and (-) CABG + dysrhythmias + Valvular Problems/Murmurs MVP      Neuro/Psych  PSYCHIATRIC DISORDERS Anxiety Depression    negative neurological ROS     GI/Hepatic Neg liver ROS, hiatal hernia,GERD  Controlled,,  Endo/Other  neg diabetes Hyperthyroidism   Renal/GU      Musculoskeletal   Abdominal   Peds  Hematology negative hematology ROS (+)   Anesthesia Other Findings Past Medical History: No date: Anemia No date: Anxiety No date: Arthritis 1995: Breast cancer (HCC)     Comment:  left breast cancer - chemo and modified mastectomy No date: Depression No date: Dyspnea No date: Dysrhythmia No date: Essential thrombocytosis (HCC) No date: GERD (gastroesophageal reflux disease) No date: Heart murmur No date: History of hiatal hernia No date: Hyperlipidemia No date: Hyperthyroidism 1997: Mitral valve prolapse No date: Osteoporosis 1995: Personal history of chemotherapy     Comment:  BREAST CA 1995: Personal history of radiation therapy     Comment:  BREAST CA No date: Sjogren's syndrome (HCC) No date: Vitamin D deficiency   Reproductive/Obstetrics negative OB ROS                              Anesthesia Physical Anesthesia Plan  ASA: 2  Anesthesia Plan: General   Post-op Pain Management: Minimal or no pain  anticipated   Induction: Intravenous  PONV Risk Score and Plan: 2 and Propofol  infusion and TIVA  Airway Management Planned: Nasal Cannula  Additional Equipment: None  Intra-op Plan:   Post-operative Plan:   Informed Consent: I have reviewed the patients History and Physical, chart, labs and discussed the procedure including the risks, benefits and alternatives for the proposed anesthesia with the patient or authorized representative who has indicated his/her understanding and acceptance.     Dental advisory given  Plan Discussed with: CRNA and Surgeon  Anesthesia Plan Comments: (Discussed risks of anesthesia with patient, including possibility of difficulty with spontaneous ventilation under anesthesia necessitating airway intervention, PONV, and rare risks such as cardiac or respiratory or neurological events, and allergic reactions. Discussed the role of CRNA in patient's perioperative care. Patient understands.)        Anesthesia Quick Evaluation

## 2023-11-16 NOTE — Anesthesia Postprocedure Evaluation (Signed)
 Anesthesia Post Note  Patient: Karen [name]  Procedure(s) Performed: COLONOSCOPY EGD (ESOPHAGOGASTRODUODENOSCOPY)  Patient location during evaluation: Endoscopy Anesthesia Type: General Level of consciousness: awake and alert Pain management: pain level controlled Vital Signs Assessment: post-procedure vital signs reviewed and stable Respiratory status: spontaneous breathing, nonlabored ventilation, respiratory function stable and patient connected to nasal cannula oxygen Cardiovascular status: blood pressure returned to baseline and stable Postop Assessment: no apparent nausea or vomiting Anesthetic complications: no   No notable events documented.   Last Vitals:  Vitals:   11/16/23 0955 11/16/23 1005  BP: (!) 85/50 102/62  Pulse:  66  Resp: 18 15  Temp:    SpO2: 100% 100%    Last Pain:  Vitals:   11/16/23 1005  TempSrc:   PainSc: 0-No pain                 Debby [place]

## 2023-11-17 LAB — SURGICAL PATHOLOGY

## 2023-11-18 MED FILL — BD TUBERCULIN SYRINGE 1 ML 27 X 1/2": 70 days supply | Qty: 10 | Fill #3

## 2023-11-22 ENCOUNTER — Inpatient Hospital Stay: Admit: 2023-11-22 | Discharge: 2023-11-23 | Payer: MEDICARE

## 2023-11-22 DIAGNOSIS — Z0000 Encounter for general adult medical examination without abnormal findings: Principal | ICD-10-CM

## 2023-11-22 DIAGNOSIS — K219 Gastro-esophageal reflux disease without esophagitis: Principal | ICD-10-CM

## 2023-11-22 DIAGNOSIS — G912 (Idiopathic) normal pressure hydrocephalus: Principal | ICD-10-CM

## 2023-11-22 DIAGNOSIS — Z1231 Encounter for screening mammogram for malignant neoplasm of breast: Principal | ICD-10-CM

## 2023-11-22 DIAGNOSIS — M810 Age-related osteoporosis without current pathological fracture: Principal | ICD-10-CM

## 2023-11-22 DIAGNOSIS — E782 Mixed hyperlipidemia: Principal | ICD-10-CM

## 2023-11-22 DIAGNOSIS — Z23 Encounter for immunization: Principal | ICD-10-CM

## 2023-11-22 DIAGNOSIS — F325 Major depressive disorder, single episode, in full remission: Principal | ICD-10-CM

## 2023-11-22 DIAGNOSIS — D473 Essential (hemorrhagic) thrombocythemia: Principal | ICD-10-CM

## 2023-11-23 ENCOUNTER — Ambulatory Visit
Admit: 2023-11-23 | Discharge: 2023-11-24 | Payer: MEDICARE | Attending: Hematology & Oncology | Primary: Hematology & Oncology

## 2023-11-23 ENCOUNTER — Other Ambulatory Visit: Admit: 2023-11-23 | Discharge: 2023-11-24 | Payer: MEDICARE

## 2023-11-23 DIAGNOSIS — D471 Chronic myeloproliferative disease: Principal | ICD-10-CM

## 2023-11-23 DIAGNOSIS — E039 Hypothyroidism, unspecified: Principal | ICD-10-CM

## 2023-11-23 DIAGNOSIS — M80842A Other osteoporosis with current pathological fracture, left hand, initial encounter for fracture: Principal | ICD-10-CM

## 2023-11-23 LAB — CBC W/ AUTO DIFF
BASOPHILS ABSOLUTE COUNT: 0.1 10*9/L (ref 0.0–0.1)
BASOPHILS RELATIVE PERCENT: 1.1 %
EOSINOPHILS ABSOLUTE COUNT: 0 10*9/L (ref 0.0–0.5)
EOSINOPHILS RELATIVE PERCENT: 0.8 %
HEMATOCRIT: 33.9 % — ABNORMAL LOW (ref 34.0–44.0)
HEMOGLOBIN: 11.7 g/dL (ref 11.3–14.9)
LYMPHOCYTES ABSOLUTE COUNT: 0.8 10*9/L — ABNORMAL LOW (ref 1.1–3.6)
LYMPHOCYTES RELATIVE PERCENT: 13.8 %
MEAN CORPUSCULAR HEMOGLOBIN CONC: 34.6 g/dL (ref 32.0–36.0)
MEAN CORPUSCULAR HEMOGLOBIN: 30 pg (ref 25.9–32.4)
MEAN CORPUSCULAR VOLUME: 86.7 fL (ref 77.6–95.7)
MEAN PLATELET VOLUME: 8.2 fL (ref 6.8–10.7)
MONOCYTES ABSOLUTE COUNT: 0.6 10*9/L (ref 0.3–0.8)
MONOCYTES RELATIVE PERCENT: 10.5 %
NEUTROPHILS ABSOLUTE COUNT: 4.1 10*9/L (ref 1.8–7.8)
NEUTROPHILS RELATIVE PERCENT: 73.8 %
PLATELET COUNT: 378 10*9/L (ref 150–450)
RED BLOOD CELL COUNT: 3.91 10*12/L — ABNORMAL LOW (ref 3.95–5.13)
RED CELL DISTRIBUTION WIDTH: 16.4 % — ABNORMAL HIGH (ref 12.2–15.2)
WBC ADJUSTED: 5.6 10*9/L (ref 3.6–11.2)

## 2023-11-23 LAB — COMPREHENSIVE METABOLIC PANEL
ALBUMIN: 3.7 g/dL (ref 3.4–5.0)
ALKALINE PHOSPHATASE: 167 U/L — ABNORMAL HIGH (ref 46–116)
ALT (SGPT): 22 U/L (ref 10–49)
ANION GAP: 13 mmol/L (ref 5–14)
AST (SGOT): 24 U/L (ref <=34)
BILIRUBIN TOTAL: 0.4 mg/dL (ref 0.3–1.2)
BLOOD UREA NITROGEN: 19 mg/dL (ref 9–23)
BUN / CREAT RATIO: 25
CALCIUM: 9.2 mg/dL (ref 8.7–10.4)
CHLORIDE: 105 mmol/L (ref 98–107)
CO2: 27 mmol/L (ref 20.0–31.0)
CREATININE: 0.77 mg/dL (ref 0.55–1.02)
EGFR CKD-EPI (2021) FEMALE: 84 mL/min/1.73m2 (ref >=60)
GLUCOSE RANDOM: 94 mg/dL (ref 70–179)
POTASSIUM: 3.6 mmol/L (ref 3.4–4.8)
PROTEIN TOTAL: 7.1 g/dL (ref 5.7–8.2)
SODIUM: 145 mmol/L (ref 135–145)

## 2023-11-23 LAB — VITAMIN D 25 HYDROXY: VITAMIN D, TOTAL (25OH): 44.1 ng/mL (ref 20.0–80.0)

## 2023-11-23 LAB — LACTATE DEHYDROGENASE: LACTATE DEHYDROGENASE: 226 U/L (ref 120–246)

## 2023-11-23 LAB — FERRITIN: FERRITIN: 47.7 ng/mL (ref 7.3–270.7)

## 2023-11-23 LAB — TSH: THYROID STIMULATING HORMONE: 1.804 u[IU]/mL (ref 0.550–4.780)
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
## 2023-11-23 NOTE — Unmapped (Addendum)
 Options:  1) Continue as is ...   2) Stop IFN and see what happens.     - Causally link IFN to anxiety.    - Check your counts once a month.  Let us  know when you get it.    - Restart and see if the anxiety.     - RTC in three months.   3) Pacritinib: This is a JAK2 inhibitor.    4) Hydrea :     All lab results last 24 hours:    Recent Results (from the past 24 hours)   Comprehensive Metabolic Panel    Collection Time: 11/23/23  7:48 AM   Result Value Ref Range    Sodium 145 135 - 145 mmol/L    Potassium 3.6 3.4 - 4.8 mmol/L    Chloride 105 98 - 107 mmol/L    CO2 27.0 20.0 - 31.0 mmol/L    Anion Gap 13 5 - 14 mmol/L    BUN 19 9 - 23 mg/dL    Creatinine 9.22 9.44 - 1.02 mg/dL    BUN/Creatinine Ratio 25     eGFR CKD-EPI (2021) Female 84 >=60 mL/min/1.74m2    Glucose 94 70 - 179 mg/dL    Calcium 9.2 8.7 - 89.5 mg/dL    Albumin 3.7 3.4 - 5.0 g/dL    Total Protein 7.1 5.7 - 8.2 g/dL    Total Bilirubin 0.4 0.3 - 1.2 mg/dL    AST 24 <=65 U/L    ALT 22 10 - 49 U/L    Alkaline Phosphatase 167 (H) 46 - 116 U/L   Lactate dehydrogenase    Collection Time: 11/23/23  7:48 AM   Result Value Ref Range    LDH 226 120 - 246 U/L   Ferritin    Collection Time: 11/23/23  7:48 AM   Result Value Ref Range    Ferritin 47.7 7.3 - 270.7 ng/mL   CBC w/ Differential    Collection Time: 11/23/23  7:48 AM   Result Value Ref Range    WBC 5.6 3.6 - 11.2 10*9/L    RBC 3.91 (L) 3.95 - 5.13 10*12/L    HGB 11.7 11.3 - 14.9 g/dL    HCT 66.0 (L) 65.9 - 44.0 %    MCV 86.7 77.6 - 95.7 fL    MCH 30.0 25.9 - 32.4 pg    MCHC 34.6 32.0 - 36.0 g/dL    RDW 83.5 (H) 87.7 - 15.2 %    MPV 8.2 6.8 - 10.7 fL    Platelet 378 150 - 450 10*9/L    Neutrophils % 73.8 %    Lymphocytes % 13.8 %    Monocytes % 10.5 %    Eosinophils % 0.8 %    Basophils % 1.1 %    Absolute Neutrophils 4.1 1.8 - 7.8 10*9/L    Absolute Lymphocytes 0.8 (L) 1.1 - 3.6 10*9/L    Absolute Monocytes 0.6 0.3 - 0.8 10*9/L    Absolute Eosinophils 0.0 0.0 - 0.5 10*9/L    Absolute Basophils 0.1 0.0 - 0.1 10*9/L    Anisocytosis Slight (A) Not Present

## 2023-11-23 NOTE — Unmapped (Signed)
 I saw and evaluated the patient, participating in the key portions of the service.  I reviewed the resident???s note.  I agree with the resident???s findings and plan. Christy LELON [name], MD    Ms Smits is is doing well from a hematologic standpoint.  Her counts meet the thresholds for treatment.  However, I am concerned about her anxiety.  This may be exacerbated by her IFN.  I offered her four options including continuing as is, stopping IFN, switch to pacritinib, or return to Hydrea .  After discussing these options, we have decided to hold her IFN reassess her symptoms.  I have had patients remain stable for years after stopping interferon.      Her ferritin is normal.  She is tolerating the iron  supplementation well.      PLAN:   1) Hold IFN  2) Check CBC once a month   3) If her CBC worsens, I would restart IFN and assess its impact her symptoms  4) If her anxiety is attributable to IFN, I would consider either pacritinib or Hydrea .   5) Continue iron  supplementation three times per week  6) Continue thyroid supplementation.  7) RTC in 3 months    History of Present Illness  Christy Buchanan is a 68 year old female with anxiety and autoimmune thyroid disorder who presents with worsening anxiety.    She has a long-standing history of anxiety, dating back to childhood, characterized by episodes where she would 'spin around and pass out.' Recently, she was prescribed Latuda, which improved her anxiety but caused excessive drooling, leading to discontinuation. A subsequent medication caused side pain. She has a history of bipolar type II and has previously taken lithium, which made her feel 'really out of it.'    She is currently on interferon therapy, which she has been on for a while, and reports no side effects except for anxiety. She takes a low dose of 0.25 mg every fourteen days. She is also on regular thyroid function monitoring due to her autoimmune thyroid disorder.    Her past medical history includes autoimmune thyroid disorder, which fluctuates between hyperthyroid and hypothyroid states. She has been stable since discontinuing thyroid medication last year. She also has a history of gastritis, as noted in a recent colonoscopy.    She has a history of taking various psychiatric medications, including an MAO inhibitor during college, which was short-lived. She is reluctant to resume lithium due to the need for regular blood draws.

## 2023-11-28 NOTE — Unmapped (Signed)
 Ms Heitzenrater is doing well from a hematologic standpoint.  However, she is having a significant amount of anxiety.  I have told her to stop the IFN.  We can recheck her counts monthly.  If her counts increase, I would restart her IFN.  We should be able to get some idea of her IFN is adding to her anxiety.      I summarized the plan below:     PLAN:   1) Hold IFN  2) Check CBC once a month   I have put in standing orders for a CBC.    3) If her CBC worsens, I would restart IFN and assess its impact her symptoms  4) If her anxiety is attributable to IFN, I would consider either pacritinib or Hydrea .   5) Continue iron  supplementation three times per week  6) Continue thyroid supplementation.  7) RTC in 3 months

## 2023-12-02 NOTE — Unmapped (Signed)
 Christy Buchanan has been contacted in regards to their refill of PEGASYS  180 mcg/mL injection (peginterferon alfa-2a ). At this time, they have declined refill due to medication being on hold. Refill assessment call date has been updated per the patient's request.

## 2023-12-06 DIAGNOSIS — F325 Major depressive disorder, single episode, in full remission: Principal | ICD-10-CM

## 2023-12-06 DIAGNOSIS — D473 Essential (hemorrhagic) thrombocythemia: Principal | ICD-10-CM

## 2023-12-16 DIAGNOSIS — D473 Essential (hemorrhagic) thrombocythemia: Principal | ICD-10-CM

## 2023-12-17 LAB — CBC W/ DIFFERENTIAL
BANDED NEUTROPHILS ABSOLUTE COUNT: 0 x10E3/uL (ref 0.0–0.1)
BASOPHILS ABSOLUTE COUNT: 0.1 x10E3/uL (ref 0.0–0.2)
BASOPHILS RELATIVE PERCENT: 1 %
EOSINOPHILS ABSOLUTE COUNT: 0.1 x10E3/uL (ref 0.0–0.4)
EOSINOPHILS RELATIVE PERCENT: 1 %
HEMATOCRIT: 35.8 % (ref 34.0–46.6)
HEMOGLOBIN: 12.4 g/dL (ref 11.1–15.9)
IMMATURE GRANULOCYTES: 0 %
LYMPHOCYTES ABSOLUTE COUNT: 1 x10E3/uL (ref 0.7–3.1)
LYMPHOCYTES RELATIVE PERCENT: 12 %
MEAN CORPUSCULAR HEMOGLOBIN CONC: 34.6 g/dL (ref 31.5–35.7)
MEAN CORPUSCULAR HEMOGLOBIN: 32.7 pg (ref 26.6–33.0)
MEAN CORPUSCULAR VOLUME: 95 fL (ref 79–97)
MONOCYTES ABSOLUTE COUNT: 0.7 x10E3/uL (ref 0.1–0.9)
MONOCYTES RELATIVE PERCENT: 8 %
NEUTROPHILS ABSOLUTE COUNT: 6.6 x10E3/uL (ref 1.4–7.0)
NEUTROPHILS RELATIVE PERCENT: 78 %
PLATELET COUNT: 498 x10E3/uL — ABNORMAL HIGH (ref 150–450)
RED BLOOD CELL COUNT: 3.79 x10E6/uL (ref 3.77–5.28)
RED CELL DISTRIBUTION WIDTH: 14.3 % (ref 11.7–15.4)
WHITE BLOOD CELL COUNT: 8.4 x10E3/uL (ref 3.4–10.8)

## 2023-12-17 LAB — COMPREHENSIVE METABOLIC PANEL
ALBUMIN: 4.4 g/dL (ref 3.9–4.9)
ALKALINE PHOSPHATASE: 129 IU/L (ref 49–135)
ALT (SGPT): 15 IU/L (ref 0–32)
AST (SGOT): 14 IU/L (ref 0–40)
BILIRUBIN TOTAL (MG/DL) IN SER/PLAS: 0.5 mg/dL (ref 0.0–1.2)
BLOOD UREA NITROGEN: 23 mg/dL (ref 8–27)
BUN / CREAT RATIO: 26 (ref 12–28)
CALCIUM: 9.8 mg/dL (ref 8.7–10.3)
CHLORIDE: 104 mmol/L (ref 96–106)
CO2: 23 mmol/L (ref 20–29)
CREATININE: 0.9 mg/dL (ref 0.57–1.00)
EGFR: 69 mL/min/1.73
GLOBULIN, TOTAL: 2.5 g/dL (ref 1.5–4.5)
GLUCOSE: 107 mg/dL — ABNORMAL HIGH (ref 70–99)
POTASSIUM: 4.3 mmol/L (ref 3.5–5.2)
SODIUM: 142 mmol/L (ref 134–144)
TOTAL PROTEIN: 6.9 g/dL (ref 6.0–8.5)

## 2023-12-20 DIAGNOSIS — D473 Essential (hemorrhagic) thrombocythemia: Principal | ICD-10-CM

## 2023-12-23 ENCOUNTER — Inpatient Hospital Stay: Admit: 2023-12-23 | Discharge: 2023-12-23 | Payer: MEDICARE

## 2023-12-29 NOTE — Telephone Encounter (Signed)
 Copied from CRM #[id number]. Topic: Access To Clinicians - Req Clinic Call Back  >> Dec 29, 2023 12:21 PM Santana RAMAN wrote:        Reason of the Call: Patient called today to request some anxiety medication    Requesting: She is a patient of Dr. Daaleman but have been receiving care from Dr. Celestine.  Dr. Philemon submitted referral for  [facility].  Patient stated that they will not have any availability for a couple of weeks.    Patient called regarding referral for     Supporting details:Patient stated that she is not sleeping due to the anxiety.  Patient sleeping for 2 hours at a time.  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 [facility] - [place], [place] - [address] AT [facility] [address]  [address] [place] [address]  Phone: [phone] Fax: [phone]          Appointment: The patient was last seen on 9/18.      Does the caller want to be contacted regarding this request? Yes. Please contact The patient by Mychart (please send my chart message once med called in or call if need to speak with patient)    Urgent callback turnaround time: within 24 business hours. Programmer, Systems Notified)

## 2023-12-31 DIAGNOSIS — G4700 Insomnia, unspecified: Principal | ICD-10-CM

## 2023-12-31 MED ORDER — TRAZODONE 50 MG TABLET
ORAL_TABLET | Freq: Every evening | ORAL | 2 refills | 30.00000 days | Status: CP
Start: 2023-12-31 — End: 2024-03-30

## 2024-01-03 DIAGNOSIS — G912 (Idiopathic) normal pressure hydrocephalus: Principal | ICD-10-CM

## 2024-01-03 DIAGNOSIS — G3184 Mild cognitive impairment, so stated: Principal | ICD-10-CM

## 2024-01-03 DIAGNOSIS — F3341 Major depressive disorder, recurrent, in partial remission: Principal | ICD-10-CM

## 2024-01-03 DIAGNOSIS — Z982 Presence of cerebrospinal fluid drainage device: Principal | ICD-10-CM

## 2024-01-03 DIAGNOSIS — F411 Generalized anxiety disorder: Principal | ICD-10-CM

## 2024-01-03 DIAGNOSIS — F410 Panic disorder [episodic paroxysmal anxiety] without agoraphobia: Principal | ICD-10-CM

## 2024-01-03 MED ORDER — ESCITALOPRAM 10 MG TABLET
ORAL_TABLET | ORAL | 0 refills | 35.00000 days | Status: CP
Start: 2024-01-03 — End: 2024-02-07

## 2024-01-03 NOTE — Progress Notes (Signed)
 Care Management Progress Note  [facility]                   Date of Service:  01/03/2024      Service: Care Management - CM communicated with provider via Epic        Purpose of contact:      Linkage to community resources      Additional Information/Plan: CM provided the following resources and requested MD to reach back out to the  Care Coordination team if additional support is needed.    Available both in-person and online  Harlene Mann  [place], [place] [address]  [phone]  [email or web address]    Available both in-person and online  [facility]  [address]  [address] [place], [place] [address]   [phone]  [email or web address]    Available online only  Ozie Plate  [place], [place] [address]  [phone]  [email or web address]    Available both in-person and online  [facility]  [address]  [address] [place], [place] [address]   [phone]    Available online only  Prentice Grieve  [place], [place] [address]  [phone]  [email or web address]    Available online only  [name]  [place], [place] [address]  [phone]  [email or web address]    Available online only  Clarita America  [place], [place] [address]  [phone]  [email or web address]    You may also want to search the below websites for additional online providers.    [facility]   [email or web address]     [facility]   [email or web address]     [facility]   [email or web address]     [facility]   [email or web address]     [facility]  [email or web address]                      Lillian Essex MSW, LCSW  Clinical Care Coordination  [facility]   Psychiatry Outpatient Services

## 2024-01-03 NOTE — Progress Notes (Signed)
 As the attending physician, I spent 15 minutes in medical discussion with the patient, participating in the key portions of the service.  I spent an additional 30 minutes on pre- and post-visit activities which were specific to the patient and included reviewing the patient???s medical records, lab results, imaging results, and discussing the case with the resident. I reviewed the resident's note and I agree with the resident's findings and plan.    Oneil Penne Eagles, MD

## 2024-01-03 NOTE — Patient Instructions (Signed)
.  It was a pleasure seeing you in clinic today!   As we discussed, as for medications please stop citalopram  and start lexapro . For the first week, take 1 tablet (10mg ) daily (night or morning). If tolerated without side effects, please increase to 2 tablets (20mg ) daily. I will talk to our case managers about potential therapist options.. We will see you back in 1 months for virtual follow up. If you need to reach me for any non-urgent needs or refills, please utilize MyChart.    Follow-up instructions:  - Please continue taking your medications as prescribed for your mental health.   - Do not make changes to your medications, including taking more or less than prescribed, unless under the supervision of your physician. Be aware that some medications may make you feel worse if abruptly stopped  - Please refrain from using illicit substances, as these can affect your mood and could cause anxiety or other concerning symptoms.   - Seek further medical care for any increase in symptoms or new symptoms such as thoughts of wanting to hurt yourself or hurt others.     Contact info:  Life-threatening emergencies: Call 911, the 988 suicide and crisis lifeline, or go to the nearest ER for medical or psychiatric attention.           Issues that need urgent attention but are not life threatening: Call the clinic outpatient front desk for assistance:    - [phone] for [facility] clinics located at [address] [facility]  - [phone] for [facility] child and adolescent psychiatry clinics located at [address]  - [phone] for [facility] and adult psychiatry clinics located at [address] [name]    Non-urgent routine concerns and questions: Send a message through [facility] or call our clinic front desk.    Refill requests: Check with your pharmacy to initiate refill requests.    Regarding appointments:  - If you need to cancel your appointment, we ask that you call your clinic at least 24 hours before your scheduled appointment at the numbers listed above.  - If for any reason you arrive 15 minutes later than your scheduled appointment time, you may not be seen and your visit may be rescheduled.  - Please remember that we will not automatically reschedule missed appointments.  - If you miss two (2) appointments without letting us  know in advance, you will likely be referred to a provider in your community.  - We will do our best to be on time. Sometimes an emergency will arise that might cause your clinician to be late. We will try to inform you of this when you check in for your appointment. If you wait more than 15 minutes past your appointment time without such notice, please speak with the front desk staff.    In the event of bad weather, the clinic staff will attempt to contact you, should your appointment need to be rescheduled. Additionally, you can call the Patient Weather Line [phone] for system-wide clinic status    For more information and reminders regarding clinic policies (these were provided when you were admitted to the clinic), please ask the front desk.
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
## 2024-01-13 MED ORDER — PROPRANOLOL 10 MG TABLET
ORAL_TABLET | Freq: Every day | ORAL | 2 refills | 30.00000 days | Status: CP | PRN
Start: 2024-01-13 — End: ?

## 2024-01-13 NOTE — Progress Notes (Signed)
 Patient still experiencing significant somatic symptoms of anxiety, possibly secondary to switch to lexapro . Discussed options for treatment - will trial low dose propranolol  10mg  every day-prn. Rx sent to [facility] in [place].     Duwaine Nation, DO  PGY-2 - [facility] Psychiatry

## 2024-01-24 NOTE — Telephone Encounter (Signed)
 Received inbasket message from patient's daughter with concerns about patient's ongoing anxiety/insomnia interfering with daily functioning.     Called patient's daughter at [phone] and patient directly to further discuss. Unfortunately could not reach either by phone, left HIPAA compliant voicemails with clinic number and requested callback.

## 2024-01-28 NOTE — Progress Notes (Signed)
 [facility] Care  Psychiatry   Established Patient E&M Service - Outpatient       Assessment:    JALEENA VIVIANI presents for follow-up evaluation.     During interval, patient and daughter messaged clinic regarding worsening anxiety and insomnia following switch from citalopram  to escitalopram . Patient unable to tolerate escitalopram  10mg  due to activation/irritability, and remained on 5mg  dose. Spoke with daughter, Meagan, by phone, who reported concern for mother's mental status and ability to care for herself after minimal restless nights and ongoing anxiety/panic. Trialed propranolol  10mg  every day-prn with minimal improvement in anxiety symptoms. Patient reported only taking trazodone  50mg  nightly  - advised to increase to 100mg , then 150mg  if minimal improvement with the former.     Today, 01/31/2024 patient reports ongoing anxiety and panic symptoms. Unable to increase dose of escitalopram  beyond 5mg  as she had increased activation/agitation/insomnia; she self-discontinued a few days ago. She has increased trazodone  to 150mg  nightly with improvement in insomnia, now sleeping 6-7 hours nightly. Per patient and daughters, anxiety has become debilitating with frequent rumination and panic attacks limiting daily functioning. Anxiety seems to be in the context of forgetfulness/confusion. She is due for a VP shunt adjustment today, and discussed that hopefully this improves some of her cognitive symptoms, however there is concern for worsening underlying dementia given declining cognitive function. Will plan to repeat MoCA at next in person visit s/p shunt adjustment for more diagnostic clarification. For ongoing anxiety resulting in poor appetite/weight loss, and insomnia - discussed re-trial of Paxil  (as was beneficial historically) vs Remeron . Will start Remeron  at 7.5mg  nightly. Additionally discussed gabapentin vs seroquel  as prn anxiolytic; will start Seroquel  at 12.5mg  bid-prn. Discussed risks and benefits of both, and advised of significant sedation risk. Advised to stop trazodone  if oversedation becomes apparent. Will follow up in 1 month for re-evaluation.     Identifying Information:  RICHARD HOLZ is a 68 y.o. female, current trauma therapist, with a somewhat complex past medical history, including Grave's disease, myeloproliferative neoplasm (Jak2+), NPH s/p VP shunt placement, mitral valve prolapse, and history of breast cancer. She has a reported psychiatric history of MDD, anxiety, PTSD, and BPAD II.  She endorses a longstanding history of anxiety and panic symptoms dating back to childhood. In addition, she has intermittently dealt with mood symptoms and has had numerous medication trials with SSRIs, benzodiazepines, MAO-I, and atypical antipsychotics. More recently, she was seen by a local psychiatrist who felt her symptoms were consistent with bipolar II, though patient reports having never had symptoms consistent with mania/hypomania. She does endorse a significant childhood trauma history, with lingering symptoms of hypervigilance, intrusive memories, and hyperarousal, which may superficially appear consistent with bipolar-spectrum illness. For several weeks prior to presentation, patient reported marked increase in anxiety symptoms such as tension, insomnia, racing worry thoughts, and anxious rumination, which has limited her ability to function well at home and at work. Of note she was taking pegylated interferon for her MDS, which is known to have significant psychiatric side effects.       Risk Assessment:  An assessment of suicide and violence risk factors was performed as part of this evaluation and is not significantly increased from the last visit.   While future psychiatric events cannot be accurately predicted, the patient does not currently require acute inpatient psychiatric care and does not currently meet [place]  involuntary commitment criteria.      Plan:    Problem: GAD - Panic symptoms - PTSD - MDD  in partial remission  Status of problem: chronic with moderate to severe exacerbation  Interventions:   -- self discontinued escitalopram    -- START Remeron  7.5mg  at bedtime with plan to uptitrate   -- START Seroquel  12.5mg  bid-prn anxiety/panic symptoms  Past trials:   Escitalopram  - overly activating/worsened insomnia   citalopram  -?helpful  Sertraline - ?helpful  Paroxetine  - ?helpful  Oxazepam - horrible withdrawal symptoms  Lurasidone - unhelpful, caused sialorrhea   Aripiprazole - unhelpful, caused sialorrhea  Unknown MAO-I    Problem: Insomnia   -- CONTINUE Trazodone  150mg  nightly-prn; discontinue first if above medications overly sedating    Problem: NPH s/p VP Shunt placement - [facility]  Status of problem: chronic and stable  Interventions:   Neuropsych testing performed in 05/2022: impaired executive functioning, variable language performance, and difficulty with initial learning and encoding of information... ADL/IADL functioning remains primarily intact  -- shunt adjustment with neurosurg 01/31/24  -- repeat MoCA early 2026        Psychotherapy provided:  No billable psychotherapy service provided.    Patient has been given information on how to contact this clinician for concerns. The patient has been instructed to call 911 for emergencies.    Patient was seen and plan of care was discussed with the Attending MD,Goodman, who agrees with the above statement and plan.      Subjective:    Interval History:     History of Present Illness  [name] A Billingham is a 68 year old with a history of anxiety and depression presenting with severe anxiety, panic symptoms, insomnia, and weight loss. She is accompanied by her daughters.    Ongoing severe anxiety, which she describes as debilitating and present throughout most of the day, continues to affect her. Frequent worry and obsessional rumination, especially around safety and daily tasks such as concerns about her cats being cared for while she is away, persist. She states that minor events often trigger her anxiety and escalate into spirals that she finds difficult to interrupt. Her daughter notes that she becomes easily triggered, spirals into anxiety, and struggles to regroup or refocus. Physical symptoms of anxiety include feeling jittery, shaky, heart racing, clamminess, and a pressing sensation on her chest. She reports frequent pacing, identifying this as a new behavior in the past month and using it as a coping mechanism, though she finds it ineffective. Her daughter observes that she paces for hours daily, especially after returning from activities. Panic symptoms, including heart pounding and feeling panicky, occur particularly during anxiety spirals. She describes attempts at meditational breathing but finds it insufficient for relief.    Significant obsessional thinking and rumination occupy much of her day. She reports that her worries often focus on safety and can be about anything, with recent preoccupations related to travel and pet care. She acknowledges some checking behaviors, such as double-checking things due to concerns about her memory, but denies a history of classic obsessive-compulsive behaviors like repeated hand washing or lock checking.    Poor concentration, memory difficulties, and feeling easily fatigued, especially by the end of the day, continue to affect her. Both she and her daughter note difficulty focusing and report that memory loss and cognitive issues have become more prominent recently.    Low mood and sadness persist, though she and her daughter agree that anxiety remains the predominant and most impairing symptom at this time. She reports loss of interest in food, stating she does not feel hungry and relates this to stress and anxiety.  Her daughter reports significant weight loss, notes that she eats very little during the day, and provides frequent reminders to eat. She typically eats a small breakfast, often skips lunch, and may have a light dinner, with occasional use of protein drinks. She denies nausea or stomach pain and relates her poor appetite to anxiety. Frequent burping, which she and her daughter associate with heavy breathing during anxiety and pacing, also occurs.    Chronic insomnia continues, but she states that increasing her trazodone  to 150 mg nightly has helped her achieve 4 to 7 hours of sleep per night. She notes improvement in sleep with this regimen. She reports that she stopped taking escitalopram  (Lexapro ) due to feeling more anxious, jittery, and unable to focus shortly after starting it, even at low doses (5 mg and 10 mg). She also reports that propranolol  did not help her anxiety and that she discontinued it after a few days. She currently takes trazodone  150 mg nightly for sleep. She expresses concern about the impact of her anxiety and insomnia on her daily functioning and physical health.    She denies suicidal ideation, stating she has 8 grandchildren and 3 daughters and continues to get up each day and do what she needs to do, though she finds it increasingly difficult. She denies perceptual disturbances such as hallucinations and denies paranoia outside of her usual anxiety-related fears. She acknowledges a tendency to catastrophize and spend much of the day in this mental state.     She reports a prior trial of Paxil  for anxiety and depression with good tolerability and beneficial weight gain; duration not specified. She previously used citalopram  with partial benefit for anxiety, discontinued due to limited dose escalation and cardiac concerns. She tried sertraline (Zoloft) a long time ago and discontinued it due to headaches.           Portions of this record may have been created using Web Designer. If [employer]bridge was used patient/caregiver provided verbal consent for use during encounter. Any errors in syntax or even information may not have been identified and edited on initial review prior to signing this note        Objective:    Mental Status Exam:  Appearance:    Appears stated age and Clean/Neat   Motor:   No abnormal movements   Speech/Language:    Normal rate, volume, tone, fluency and Language intact, well formed   Mood:   Anxious, a little sad   Affect:   Anxious, Decreased range, and Mood congruent   Thought process and Associations:   Logical, linear, clear, coherent, goal directed   Abnormal/psychotic thought content:     Denies SI, HI; endorses anxious rumination/obsessions    Perceptual disturbances:     Denies auditory and visual hallucinations, behavior not concerning for response to internal stimuli     Other:            Visit was completed by video (or phone) and the appropriate disclaimer has been included below.    The patient reports they are physically located in [place]  and is currently: at home. I conducted a audio/video visit. I spent  52m 49s on the video call with the patient. I spent an additional 15 minutes on pre- and post-visit activities on the date of service .            Lilja Soland M Yaqueline Gutter, DO

## 2024-01-28 NOTE — Progress Notes (Signed)
 [facility] Health Care  Psychiatry   Established Patient E&M Service - Outpatient       Assessment:    Karen [name] presents for follow-up evaluation.     During interval, patient and daughter messaged clinic regarding worsening anxiety and insomnia following switch from citalopram  to escitalopram . Patient unable to tolerate escitalopram  10mg  due to activation/irritability, and remained on 5mg  dose. Spoke with daughter, Meagan, by phone, who reported concern for mother's mental status and ability to care for herself after minimal restless nights and ongoing anxiety/panic. Trialed propranolol  10mg  every day-prn with minimal improvement in anxiety symptoms. Patient reported only taking trazodone  50mg  nightly  - advised to increase to 100mg , then 150mg  if minimal improvement with the former.     Today, 01/31/2024 patient reports ongoing anxiety and panic symptoms. Unable to increase dose of escitalopram  beyond 5mg  as she had increased activation/agitation/insomnia; she self-discontinued a few days ago. She has increased trazodone  to 150mg  nightly with improvement in insomnia, now sleeping 6-7 hours nightly. Per patient and daughters, anxiety has become debilitating with frequent rumination and panic attacks limiting daily functioning. Anxiety seems to be in the context of forgetfulness/confusion. She is due for a VP shunt adjustment today, and discussed that hopefully this improves some of her cognitive symptoms, however there is concern for worsening underlying dementia given declining cognitive function. Will plan to repeat MoCA at next in person visit s/p shunt adjustment for more diagnostic clarification. For ongoing anxiety resulting in poor appetite/weight loss, and insomnia - discussed re-trial of Paxil  (as was beneficial historically) vs Remeron . Will start Remeron  at 7.5mg  nightly. Additionally discussed gabapentin vs seroquel  as prn anxiolytic; will start Seroquel  at 12.5mg  bid-prn. Discussed risks and benefits of both, and advised of significant sedation risk. Advised to stop trazodone  if oversedation becomes apparent. Will follow up in 1 month for re-evaluation.     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 She has a reported psychiatric history of MDD, anxiety, PTSD, and BPAD II.  She endorses a longstanding history of anxiety and panic symptoms dating back to childhood. In addition, she has intermittently dealt with mood symptoms and has had numerous medication trials with SSRIs, benzodiazepines, MAO-I, and atypical antipsychotics. More recently, she was seen by a local psychiatrist who felt her symptoms were consistent with bipolar II, though patient reports having never had symptoms consistent with mania/hypomania. She does endorse a significant childhood trauma history, with lingering symptoms of hypervigilance, intrusive memories, and hyperarousal, which may superficially appear consistent with bipolar-spectrum illness. For several weeks prior to presentation, patient reported marked increase in anxiety symptoms such as tension, insomnia, racing worry thoughts, and anxious rumination, which has limited her ability to function well at home and at work. Of note she was taking pegylated interferon for her MDS, which is known to have significant psychiatric side effects.       Risk Assessment:  An assessment of suicide and violence risk factors was performed as part of this evaluation and is not significantly increased from the last visit.   While future psychiatric events cannot be accurately predicted, the patient does not currently require acute inpatient psychiatric care and does not currently meet [place]  involuntary commitment criteria.      Plan:    Problem: GAD - Panic symptoms - PTSD - MDD  in partial remission  Status of problem: chronic with moderate to severe exacerbation  Interventions:   -- self discontinued escitalopram    -- START Remeron  7.5mg  at bedtime with plan to uptitrate   -- START Seroquel  12.5mg  bid-prn anxiety/panic symptoms  Past trials:   Escitalopram  - overly activating/worsened insomnia   citalopram  -?helpful  Sertraline - ?helpful  Paroxetine  - ?helpful  Oxazepam - horrible withdrawal symptoms  Lurasidone - unhelpful, caused sialorrhea   Aripiprazole - unhelpful, caused sialorrhea  Unknown MAO-I    Problem: Insomnia   -- CONTINUE Trazodone  150mg  nightly-prn; discontinue first if above medications overly sedating    Problem: NPH s/p VP Shunt placement - [facility]  Status of problem: chronic and stable  Interventions:   Neuropsych testing performed in 05/2022: impaired executive functioning, variable language performance, and difficulty with initial learning and encoding of information... ADL/IADL functioning remains primarily intact  -- shunt adjustment with neurosurg 01/31/24  -- repeat MoCA early 2026        Psychotherapy provided:  No billable psychotherapy service provided.    Patient has been given information on how to contact this clinician for concerns. The patient has been instructed to call 911 for emergencies.    Patient was seen and plan of care was discussed with the Attending MD,Goodman, who agrees with the above statement and plan.      Subjective:    Interval History:     History of Present Illness  Karen [name] is a 68 year old with a history of anxiety and depression presenting with severe anxiety, panic symptoms, insomnia, and weight loss. She is accompanied by her daughters.    Ongoing severe anxiety, which she describes as debilitating and present throughout most of the day, continues to affect her. Frequent worry and obsessional rumination, especially around safety and daily tasks such as concerns about her cats being cared for while she is away, persist. She states that minor events often trigger her anxiety and escalate into spirals that she finds difficult to interrupt. Her daughter notes that she becomes easily triggered, spirals into anxiety, and struggles to regroup or refocus. Physical symptoms of anxiety include feeling jittery, shaky, heart racing, clamminess, and a pressing sensation on her chest. She reports frequent pacing, identifying this as a new behavior in the past month and using it as a coping mechanism, though she finds it ineffective. Her daughter observes that she paces for hours daily, especially after returning from activities. Panic symptoms, including heart pounding and feeling panicky, occur particularly during anxiety spirals. She describes attempts at meditational breathing but finds it insufficient for relief.    Significant obsessional thinking and rumination occupy much of her day. She reports that her worries often focus on safety and can be about anything, with recent preoccupations related to travel and pet care. She acknowledges some checking behaviors, such as double-checking things due to concerns about her memory, but denies a history of classic obsessive-compulsive behaviors like repeated hand washing or lock checking.    Poor concentration, memory difficulties, and feeling easily fatigued, especially by the end of the day, continue to affect her. Both she and her daughter note difficulty focusing and report that memory loss and cognitive issues have become more prominent recently.    Low mood and sadness persist, though she and her daughter agree that anxiety remains the predominant and most impairing symptom at this time. She reports loss of interest in food, stating she does not feel hungry and relates this to stress and anxiety.  Her daughter reports significant weight loss, notes that she eats very little during the day, and provides frequent reminders to eat. She typically eats a small breakfast, often skips lunch, and may have a light dinner, with occasional use of protein drinks. She denies nausea or stomach pain and relates her poor appetite to anxiety. Frequent burping, which she and her daughter associate with heavy breathing during anxiety and pacing, also occurs.    Chronic insomnia continues, but she states that increasing her trazodone  to 150 mg nightly has helped her achieve 4 to 7 hours of sleep per night. She notes improvement in sleep with this regimen. She reports that she stopped taking escitalopram  (Lexapro ) due to feeling more anxious, jittery, and unable to focus shortly after starting it, even at low doses (5 mg and 10 mg). She also reports that propranolol  did not help her anxiety and that she discontinued it after a few days. She currently takes trazodone  150 mg nightly for sleep. She expresses concern about the impact of her anxiety and insomnia on her daily functioning and physical health.    She denies suicidal ideation, stating she has 8 grandchildren and 3 daughters and continues to get up each day and do what she needs to do, though she finds it increasingly difficult. She denies perceptual disturbances such as hallucinations and denies paranoia outside of her usual anxiety-related fears. She acknowledges a tendency to catastrophize and spend much of the day in this mental state.     She reports a prior trial of Paxil  for anxiety and depression with good tolerability and beneficial weight gain; duration not specified. She previously used citalopram  with partial benefit for anxiety, discontinued due to limited dose escalation and cardiac concerns. She tried sertraline (Zoloft) a long time ago and discontinued it due to headaches.           Portions of this record may have been created using Web Designer. If [employer] was used patient/caregiver provided verbal consent for use during encounter. Any errors in syntax or even information may not have been identified and edited on initial review prior to signing this note        Objective:    Mental Status Exam:  Appearance:    Appears stated age and Clean/Neat   Motor:   No abnormal movements   Speech/Language:    Normal rate, volume, tone, fluency and Language intact, well formed   Mood:   Anxious, a little sad   Affect:   Anxious, Decreased range, and Mood congruent   Thought process and Associations:   Logical, linear, clear, coherent, goal directed   Abnormal/psychotic thought content:     Denies SI, HI; endorses anxious rumination/obsessions    Perceptual disturbances:     Denies auditory and visual hallucinations, behavior not concerning for response to internal stimuli     Other:            Visit was completed by video (or phone) and the appropriate disclaimer has been included below.    The patient reports they are physically located in [place]  and is currently: at home. I conducted a audio/video visit. I spent  52m 49s on the video call with the patient. I spent an additional 15 minutes on pre- and post-visit activities on the date of service .            Lilja Soland M Yaqueline Gutter, DO

## 2024-01-31 ENCOUNTER — Ambulatory Visit: Admit: 2024-01-31 | Discharge: 2024-01-31 | Payer: MEDICARE

## 2024-01-31 DIAGNOSIS — F411 Generalized anxiety disorder: Principal | ICD-10-CM

## 2024-01-31 DIAGNOSIS — R350 Frequency of micturition: Principal | ICD-10-CM

## 2024-01-31 DIAGNOSIS — R410 Disorientation, unspecified: Principal | ICD-10-CM

## 2024-01-31 DIAGNOSIS — F410 Panic disorder [episodic paroxysmal anxiety] without agoraphobia: Principal | ICD-10-CM

## 2024-01-31 DIAGNOSIS — G4700 Insomnia, unspecified: Principal | ICD-10-CM

## 2024-01-31 DIAGNOSIS — G912 (Idiopathic) normal pressure hydrocephalus: Principal | ICD-10-CM

## 2024-01-31 DIAGNOSIS — F3341 Major depressive disorder, recurrent, in partial remission: Principal | ICD-10-CM

## 2024-01-31 LAB — URINALYSIS WITH MICROSCOPY
BACTERIA: NONE SEEN /HPF
BILIRUBIN UA: NEGATIVE
BLOOD UA: NEGATIVE
GLUCOSE UA: NEGATIVE
KETONES UA: NEGATIVE
LEUKOCYTE ESTERASE UA: NEGATIVE
NITRITE UA: NEGATIVE
PH UA: 5.5 (ref 5.0–9.0)
RBC UA: 3 /HPF (ref <=4)
SPECIFIC GRAVITY UA: 1.025 (ref 1.003–1.030)
SQUAMOUS EPITHELIAL: 6 /HPF — ABNORMAL HIGH (ref 0–5)
TRANSITIONAL EPITHELIAL: 1 /HPF (ref 0–2)
UROBILINOGEN UA: <2
WBC UA: <1 /HPF (ref 0–5)

## 2024-01-31 MED ORDER — TRAZODONE 150 MG TABLET
ORAL_TABLET | Freq: Every evening | ORAL | 2 refills | 30.00000 days | Status: CP
Start: 2024-01-31 — End: 2024-04-30

## 2024-01-31 MED ORDER — MIRTAZAPINE 7.5 MG TABLET
ORAL_TABLET | Freq: Every evening | ORAL | 2 refills | 90.00000 days | Status: CP
Start: 2024-01-31 — End: 2024-10-27

## 2024-01-31 MED ORDER — QUETIAPINE 25 MG TABLET
ORAL_TABLET | Freq: Two times a day (BID) | ORAL | 2 refills | 60.00000 days | Status: CP | PRN
Start: 2024-01-31 — End: 2024-07-29

## 2024-01-31 NOTE — Patient Instructions (Signed)
 It was a pleasure seeing you in clinic today!   As we discussed, as for medications please start:  - Remeron  (mirtazapine ) 7.5mg  nightly   - Seroquel  (quetiapine ) 12.5mg  twice daily as needed for anxiety/panic  - Trazodone  150mg  nightly as needed for insomnia (if overly sedating, stop this medication first)  . We will see you back in 1 month for follow up. If you need to reach me for any non-urgent needs or refills, please utilize MyChart.    Follow-up instructions:  - Please continue taking your medications as prescribed for your mental health.   - Do not make changes to your medications, including taking more or less than prescribed, unless under the supervision of your physician. Be aware that some medications may make you feel worse if abruptly stopped  - Please refrain from using illicit substances, as these can affect your mood and could cause anxiety or other concerning symptoms.   - Seek further medical care for any increase in symptoms or new symptoms such as thoughts of wanting to hurt yourself or hurt others.     Contact info:  Life-threatening emergencies: Call 911, the 988 suicide and crisis lifeline, or go to the nearest ER for medical or psychiatric attention.           Issues that need urgent attention but are not life threatening: Call the clinic outpatient front desk for assistance:    - [phone] for [facility] located at [address] [facility]  - [phone] for [facility] child and adolescent psychiatry clinics located at [address]  - [phone] for [facility] and adult psychiatry clinics located at [address] [facility]    Non-urgent routine concerns and questions: Send a message through [facility] or call our clinic front desk.    Refill requests: Check with your pharmacy to initiate refill requests.    Regarding appointments:  - If you need to cancel your appointment, we ask that you call your clinic at least 24 hours before your scheduled appointment at the numbers listed above.  - If for any reason you arrive 15 minutes later than your scheduled appointment time, you may not be seen and your visit may be rescheduled.  - Please remember that we will not automatically reschedule missed appointments.  - If you miss two (2) appointments without letting us  know in advance, you will likely be referred to a provider in your community.  - We will do our best to be on time. Sometimes an emergency will arise that might cause your clinician to be late. We will try to inform you of this when you check in for your appointment. If you wait more than 15 minutes past your appointment time without such notice, please speak with the front desk staff.    In the event of bad weather, the clinic staff will attempt to contact you, should your appointment need to be rescheduled. Additionally, you can call the Patient Weather Line [phone] for system-wide clinic status    For more information and reminders regarding clinic policies (these were provided when you were admitted to the clinic), please ask the front desk.

## 2024-01-31 NOTE — Progress Notes (Signed)
 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 Although shunt failure typically presents gradually, acute changes necessitate ruling out reversible causes. The shunt valve was previously adjusted and further decreased today to increase cerebrospinal fluid flow. Risks of shunt adjustment, including subdural hematoma, were discussed.  - Adjusted shunt valve setting from 110 to 70 to increase flow. Re-evaluate in 4 weeks for progress   - Ordered head CT in four weeks to assess for subdural hematoma and ventricular size post-adjustment.  - Instructed her to obtain urinalysis to rule out urinary tract infection as a cause of acute confusion. Advised her to follow up with PCP for results and/or see urgent care given her travel to [place]  for the holidays if needed for UTI treatment   - Advised her to monitor for new or worsening headaches, particularly those unresponsive to acetaminophen  or ibuprofen, and to seek emergency evaluation with head CT if these occur.  - Discussed that if symptoms persist after shunt adjustment and urinary tract infection is excluded, nuclear medicine shunt study may be indicated to evaluate for shunt malfunction.  - Instructed her to provide symptom updates in the coming days, especially following shunt adjustment.  - Planned follow-up visit after head CT to reassess clinical status and review imaging results.    History of Present Illness  Christy Buchanan is a 68 year old female with normal pressure hydrocephalus status post ventriculoperitoneal shunt placement who presents with progressive cognitive decline and concern for shunt malfunction.    Over the past two months, she has experienced rapid cognitive decline characterized by worsening confusion, mental fogginess, and impaired concentration. She describes herself as not functioning well and scared ninety five percent of the time. Family members report that she is no longer comfortable living alone due to the severity of her cognitive impairment. She has not seen her primary care provider during this period but is scheduled to see a psychiatrist for anxiety and insomnia.    Balance has improved compared to her pre-shunt status, though she experienced a fall on December 14th.  Family reports that her balance is generally stable, allowing her to ambulate quickly without falling, but she occasionally experiences dizziness. Prior to shunt placement, her balance was described as terrible. Family has provided 24-hour support for the past three days due to her cognitive decline.    Urinary frequency persists. She is not incontinent but urinates frequently and currently experiences nocturia, getting up twice per night to void. After shunt placement, nocturia initially improved but has since recurred. She denies urinary incontinence and dysuria.     She has significant insomnia, poor appetite, and weight loss. Anxiety is described as over the top. She also reports increased sweating, which she attributes to prior propranolol  use, now discontinued. She denies fevers or chills. She takes aspirin 81 mg daily and uses acetaminophen  for headaches if needed.    Physical Exam  Physical Exam:     General: No acute distress. Pt sitting comfortably in exam room. Accompanied by daughters  Ht 157.5 cm (5' 2)  - Wt 46.1 kg (101 lb 9.6 oz)  - BMI 18.58 kg/m??     Neuro: A & O x3.   EOMI, no nystagmus. PERRLA.   Facial sensation intact and equal bilaterally.  Facial muscles intact and symmetric. Swallowing intact, uvula rises midline, tongue protrudes midline.   Shoulder shrug, head movement intact and equal bilaterally.   No tremors. Motor strength 5/5 upper and lower extremeties, bilaterally.     Gait smooth and coordinated.   RAM and finger-to-nose smoothly intact.   Sensation intact to light touch, equal and symmetric.     Cardiovascular: pink and well-perfused, brisk capillary refill. No edema.   Respiratory: breathing is even and non-labored, acyanotic.   HEENT: sclera non-injected. Neck supple.  Musculoskeletal: MAE. Full ROM with no crepitus or pain.  GI: abdomen soft, non-tender, non-distended.  Skin: warm, dry, intact. Small scab noted on the shunt valve site. No drainage or purulence noted     Shunt valve pumps and refills       Results  Radiology: no new imaging foro review    Ventriculoperitoneal shunt valve adjustment  Shunt valve setting decreased from 110 to 70 using external adjustment device. Adjusted and verified using the digital programmer     Attestation      This note was created with assistance from  ambient AI documentation software, with the patient's verbal consent, to prioritize the physician-patient interaction over extensive documentation during the visit. I have carefully reviewed and edited the note for accuracy, but occasional discrepancies in documentation may occur

## 2024-01-31 NOTE — Progress Notes (Signed)
 As the attending physician, I spent 15 minutes in medical discussion with the patient, participating in the key portions of the service.  I spent an additional 20 minutes on pre- and post-visit activities which were specific to the patient and included reviewing the patient???s medical records, lab results, imaging results, and discussing the case with the resident. I reviewed the resident's note and I agree with the resident's findings and plan.    Oneil Penne Eagles, MD

## 2024-01-31 NOTE — Patient Instructions (Signed)
 Follow up with me in about 4 weeks after the head ct   Follow up with urgent care regarding a possible UTI if your confusion continues or worsens

## 2024-01-31 NOTE — Progress Notes (Signed)
 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 Although shunt failure typically presents gradually, acute changes necessitate ruling out reversible causes. The shunt valve was previously adjusted and further decreased today to increase cerebrospinal fluid flow. Risks of shunt adjustment, including subdural hematoma, were discussed.  - Adjusted shunt valve setting from 110 to 70 to increase flow. Re-evaluate in 4 weeks for progress   - Ordered head CT in four weeks to assess for subdural hematoma and ventricular size post-adjustment.  - Instructed her to obtain urinalysis to rule out urinary tract infection as a cause of acute confusion. Advised her to follow up with PCP for results and/or see urgent care given her travel to [place]  for the holidays if needed for UTI treatment   - Advised her to monitor for new or worsening headaches, particularly those unresponsive to acetaminophen  or ibuprofen, and to seek emergency evaluation with head CT if these occur.  - Discussed that if symptoms persist after shunt adjustment and urinary tract infection is excluded, nuclear medicine shunt study may be indicated to evaluate for shunt malfunction.  - Instructed her to provide symptom updates in the coming days, especially following shunt adjustment.  - Planned follow-up visit after head CT to reassess clinical status and review imaging results.    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    Over the past two months, she has experienced rapid cognitive decline characterized by worsening confusion, mental fogginess, and impaired concentration. She describes herself as not functioning well and scared ninety five percent of the time. Family members report that she is no longer comfortable living alone due to the severity of her cognitive impairment. She has not seen her primary care provider during this period but is scheduled to see a psychiatrist for anxiety and insomnia.    Balance has improved compared to her pre-shunt status, though she experienced a fall on December 14th.  Family reports that her balance is generally stable, allowing her to ambulate quickly without falling, but she occasionally experiences dizziness. Prior to shunt placement, her balance was described as terrible. Family has provided 24-hour support for the past three days due to her cognitive decline.    Urinary frequency persists. She is not incontinent but urinates frequently and currently experiences nocturia, getting up twice per night to void. After shunt placement, nocturia initially improved but has since recurred. She denies urinary incontinence and dysuria.     She has significant insomnia, poor appetite, and weight loss. Anxiety is described as over the top. She also reports increased sweating, which she attributes to prior propranolol  use, now discontinued. She denies fevers or chills. She takes aspirin 81 mg daily and uses acetaminophen  for headaches if needed.    Physical Exam  Physical Exam:     General: No acute distress. Pt sitting comfortably in exam room. Accompanied by daughters  Ht 157.5 cm (5' 2)  - Wt 46.1 kg (101 lb 9.6 oz)  - BMI 18.58 kg/m??     Neuro: A & O x3.   EOMI, no nystagmus. PERRLA.   Facial sensation intact and equal bilaterally.  Facial muscles intact and symmetric. Swallowing intact, uvula rises midline, tongue protrudes midline.   Shoulder shrug, head movement intact and equal bilaterally.   No tremors. Motor strength 5/5 upper and lower extremeties, bilaterally.     Gait smooth and coordinated.   RAM and finger-to-nose smoothly intact.   Sensation intact to light touch, equal and symmetric.     Cardiovascular: pink and well-perfused, brisk capillary refill. No edema.   Respiratory: breathing is even and non-labored, acyanotic.   HEENT: sclera non-injected. Neck supple.  Musculoskeletal: MAE. Full ROM with no crepitus or pain.  GI: abdomen soft, non-tender, non-distended.  Skin: warm, dry, intact. Small scab noted on the shunt valve site. No drainage or purulence noted     Shunt valve pumps and refills       Results  Radiology: no new imaging foro review    Ventriculoperitoneal shunt valve adjustment  Shunt valve setting decreased from 110 to 70 using external adjustment device. Adjusted and verified using the digital programmer     Attestation      This note was created with assistance from  ambient AI documentation software, with the patient's verbal consent, to prioritize the physician-patient interaction over extensive documentation during the visit. I have carefully reviewed and edited the note for accuracy, but occasional discrepancies in documentation may occur

## 2024-02-08 ENCOUNTER — Other Ambulatory Visit: Payer: Self-pay

## 2024-02-08 ENCOUNTER — Emergency Department
Admission: EM | Admit: 2024-02-08 | Discharge: 2024-02-09 | Disposition: A | Discharge status: Other Acute Inpatient Hospital

## 2024-02-08 DIAGNOSIS — F411 Generalized anxiety disorder: Secondary | ICD-10-CM

## 2024-02-08 DIAGNOSIS — Z87891 Personal history of nicotine dependence: Secondary | ICD-10-CM | POA: Diagnosis not present

## 2024-02-08 DIAGNOSIS — M810 Age-related osteoporosis without current pathological fracture: Secondary | ICD-10-CM | POA: Insufficient documentation

## 2024-02-08 DIAGNOSIS — F332 Major depressive disorder, recurrent severe without psychotic features: Secondary | ICD-10-CM

## 2024-02-08 DIAGNOSIS — S61512A Laceration without foreign body of left wrist, initial encounter: Secondary | ICD-10-CM | POA: Diagnosis present

## 2024-02-08 DIAGNOSIS — X789XXA Intentional self-harm by unspecified sharp object, initial encounter: Secondary | ICD-10-CM | POA: Insufficient documentation

## 2024-02-08 DIAGNOSIS — T8509XA Other mechanical complication of ventricular intracranial (communicating) shunt, initial encounter: Secondary | ICD-10-CM | POA: Insufficient documentation

## 2024-02-08 DIAGNOSIS — Z79899 Other long term (current) drug therapy: Secondary | ICD-10-CM | POA: Insufficient documentation

## 2024-02-08 DIAGNOSIS — F4312 Post-traumatic stress disorder, chronic: Secondary | ICD-10-CM | POA: Diagnosis not present

## 2024-02-08 DIAGNOSIS — Z853 Personal history of malignant neoplasm of breast: Secondary | ICD-10-CM | POA: Insufficient documentation

## 2024-02-08 DIAGNOSIS — Y828 Other medical devices associated with adverse incidents: Secondary | ICD-10-CM | POA: Diagnosis not present

## 2024-02-08 LAB — CBC
HCT: 32.1 % — ABNORMAL LOW (ref 36.0–46.0)
Hemoglobin: 10.8 g/dL — ABNORMAL LOW (ref 12.0–15.0)
MCH: 30.2 pg (ref 26.0–34.0)
MCHC: 33.6 g/dL (ref 30.0–36.0)
MCV: 89.7 fL (ref 80.0–100.0)
Platelets: 513 K/uL — ABNORMAL HIGH (ref 150–400)
RBC: 3.58 MIL/uL — ABNORMAL LOW (ref 3.87–5.11)
RDW: 13.9 % (ref 11.5–15.5)
WBC: 10.4 K/uL (ref 4.0–10.5)
nRBC: 0 % (ref 0.0–0.2)

## 2024-02-08 LAB — URINE DRUG SCREEN
Amphetamines: NEGATIVE
Barbiturates: NEGATIVE
Benzodiazepines: NEGATIVE
Cocaine: NEGATIVE
Fentanyl: NEGATIVE
Methadone Scn, Ur: NEGATIVE
Opiates: POSITIVE — AB
Tetrahydrocannabinol: NEGATIVE

## 2024-02-08 LAB — COMPREHENSIVE METABOLIC PANEL WITH GFR
ALT: 21 U/L (ref 0–44)
AST: 20 U/L (ref 15–41)
Albumin: 4 g/dL (ref 3.5–5.0)
Alkaline Phosphatase: 95 U/L (ref 38–126)
Anion gap: 11 (ref 5–15)
BUN: 23 mg/dL (ref 8–23)
CO2: 25 mmol/L (ref 22–32)
Calcium: 9.7 mg/dL (ref 8.9–10.3)
Chloride: 103 mmol/L (ref 98–111)
Creatinine, Ser: 0.78 mg/dL (ref 0.44–1.00)
GFR, Estimated: >60 mL/min
Glucose, Bld: 105 mg/dL — ABNORMAL HIGH (ref 70–99)
Potassium: 3.5 mmol/L (ref 3.5–5.1)
Sodium: 139 mmol/L (ref 135–145)
Total Bilirubin: 0.3 mg/dL (ref 0.0–1.2)
Total Protein: 6.7 g/dL (ref 6.5–8.1)

## 2024-02-08 LAB — ETHANOL: Alcohol, Ethyl (B): <15 mg/dL

## 2024-02-08 MED ORDER — QUETIAPINE FUMARATE 25 MG PO TABS: 12.5000 mg | ORAL_TABLET | Freq: Four times a day (QID) | ORAL | Status: DC | PRN

## 2024-02-08 MED ORDER — QUETIAPINE FUMARATE 25 MG PO TABS
12.5000 mg | ORAL_TABLET | Freq: Two times a day (BID) | ORAL | Status: DC
  Administered 2024-02-09 (×2): 12.5 mg via ORAL
  Filled 2024-02-08 (×2): qty 1

## 2024-02-08 MED ORDER — TRAZODONE HCL 50 MG PO TABS
150.0000 mg | ORAL_TABLET | Freq: Every day | ORAL | Status: DC
  Administered 2024-02-09: 150 mg via ORAL
  Filled 2024-02-08: qty 1

## 2024-02-08 MED ORDER — MIRTAZAPINE 15 MG PO TABS
7.5000 mg | ORAL_TABLET | Freq: Every day | ORAL | Status: DC
  Administered 2024-02-09: 7.5 mg via ORAL
  Filled 2024-02-08: qty 1

## 2024-02-08 NOTE — Telephone Encounter (Signed)
 Received MyChart message from patient's daughter Carmelita stating that while she was traveling Christy Buchanan attempted to end her life yesterday. Requested provider call her sister, Lauraine, who has been helping care for her.     Called Sarah at [phone].     Recapped interval events since Christy Buchanan was seen 1 week ago.   Stated they picked up the new medications which seemed to be okay.   Spent Christmas with Sarah's family. Seemed dissociated during holidays and had a hard time being at her house. Seemed more depressed than usual. Catastrophic thought patterns about small issues.    Lauraine feels her cognitive decline has been rapid over the last 6-8 weeks. Notes her working memory is poor - often can't remember things that happened 20 minutes ago. Seems that Lauraine is having to constantly repeat herself and Holley is having a hard time with comprehension. Excessively repetitive behaviors such as checking bag 7 times prior to flight. Chiquita (Christy Buchanan's youngest daughter) flew back with her and noted that she noticed a steep decline in mental status.    Christy Buchanan told her daughters she is scared to live alone. Feels like she tries to hold it together in front of providers while she is really struggling internally. Daughters have planned [place] support system here.     Today, Christy Buchanan disclosed to Lauraine that she had tried to slit her wrists in backyard yesterday. Sarah called 55 and advised her mother she needs to be in a hospital.  Crisis team was dispatched to Christy Buchanan's residence, and she was taken to [facility] for assessment.     Discussed concerns about severe depression/anxiety and cognitive being potentially secondary to organic vs primary psychiatric illness. Answered questions regarding potential IVC process and psychiatric hospitalization.

## 2024-02-08 NOTE — Consult Note (Incomplete)
 Karen [name] Consult Note  Patient Name: Karen [name] MRN: [id number] DOB: 03-07-1955 DATE OF Consult: 02/08/2024 Consult Order details:  Orders (From admission, onward)     Start     Ordered   02/08/24 1623  CONSULT TO CALL ACT TEAM       Ordering Provider: Jacolyn Pae, MD  Provider:  (Not yet assigned)  Question:  Reason for Consult?  Answer:  Psych consult   02/08/24 1623   02/08/24 1623  IP CONSULT TO PSYCHIATRY       Ordering Provider: Jacolyn Pae, MD  Provider:  (Not yet assigned)  Question Answer Comment  Reason for consult: Other (see comments)   Comments: Self-harm      02/08/24 1623            PRIMARY PSYCHIATRIC DIAGNOSES   1.  Major Depression, Recurrent, Severe without Psychotic Symptoms 2.  Generalized Anxiety Disorder   RECOMMENDATIONS  Recommendations: Medication recommendations: Patient to continue on her current meds, which include Remeron 7.5 mg at bedtime for depression/anxiety; Seroquel, 12.5 mg bid for mood-related anxiety; and trazodone, 150 mg at bedtime for depression/anxiety/sleep.  PRN:  Seroquel, 12.5 mg po q6h PRN mood-related anxiety/agitation Non-Medication/therapeutic recommendations: Given patient's level of anxiety and some cognitive challenges, recommend that she continue with constant observation, as per ED protocol, until she can be admitted safely to Psychiatry.  Continue with matter-of-fact emotional support in ED, pending transfer Is inpatient psychiatric hospitalization recommended for this patient? No (Explain why): YES.  (No is no longer correct, but cannot be changed in template)  After extended discussion with patient's daughter, and further discussion with patient, it appears that she has been feeling quite agitated and dysregulated, as well as has been having cognitive challenges that are making it difficult for her to monitor behaviors.  Unclear that she has been taking meds regularly, and patient herself  does admit that this was a suicide attempt.  Given her instability with mood and cognition, with a suicide attempt resulting, I do believe that she meets criteria for inpatient admission for stabilization and treatment, even if she is not acutely suicidal at this point.  Concern is that she will worsen from a MSE standpoint and become dangerous to herself again.  Patient is willing to sign in voluntarily. Is another care setting recommended for this patient? (examples may include Crisis Stabilization Unit, Residential/Recovery Treatment, ALF/SNF, Memory Care Unit)  No (Explain why): As above From a psychiatric perspective, is this patient appropriate for discharge to an outpatient setting/resource or other less restrictive environment for continued care?  Yes (Explain why): NO.  (Yes is no longer correct, as above, but cannot be changed in template).  See above  Follow-Up [name] C/L services: We will sign off for now. Please re-consult our service if needed for any concerning changes in the patient's condition, discharge planning, or questions. Communication: Treatment team members (and family members if applicable) who were involved in treatment/care discussions and planning, and with whom we spoke or engaged with via secure text/chat, include the following: Secure message sent to Drs. Karen [name], ED attendings, and staff, outlining current recommendations.  I personally spent a total of 75 minutes in the care of the patient today including preparing to see the patient, getting/reviewing separately obtained history, performing a medically appropriate exam/evaluation, counseling and educating, referring and communicating with other health care professionals, documenting clinical information in the EHR, and independently interpreting results.  Thank you for involving us  in the care of this  patient. If you have any additional questions or concerns, please call [phone] and ask for the  provider on-call.   [name] ATTESTATION & CONSENT   As the provider for this telehealth consult, I attest that I verified the patients identity using two separate identifiers, introduced myself to the patient, provided my credentials, disclosed my location, and performed this encounter via a HIPAA-compliant, real-time, face-to-face, two-way, interactive audio and video platform and with the full consent and agreement of the patient (or guardian as applicable.)   Extended discussion, with patient's permission, with her daughter, Karen [name] ([phone]), with discussion of collateral information, assessment, and recommendations  Patient physical location: ED, [facility]. Telehealth provider physical location: home office in state of [place] .  Video start time: 2210h EST  Video end time: 2230h EST  2nd video:  2330h EST, 2340h EST   IDENTIFYING DATA  Karen [name] is a 68 y.o. year-old female for whom a psychiatric consultation has been ordered by the primary provider. The patient was identified using two separate identifiers.  CHIEF COMPLAINT/REASON FOR CONSULT   I'm so embarrassed.  I got quite overwhelmed with anxiety last night, and I cut myself on the arm.  But I do not at all wish to die and wish to seek help.   HISTORY OF PRESENT ILLNESS (HPI)  The patient ***.  PAST PSYCHIATRIC HISTORY  *** Otherwise as per HPI above.  PAST MEDICAL HISTORY  Past Medical History:  Diagnosis Date   Anemia    Anxiety    Arthritis    Breast cancer (HCC) 1995   left breast cancer - chemo and modified mastectomy   Depression    Dyspnea    Dysrhythmia    Essential thrombocytosis (HCC)    GERD (gastroesophageal reflux disease)    Heart murmur    History of hiatal hernia    Hyperlipidemia    Hyperthyroidism    Mitral valve prolapse 1997   Normal pressure hydrocephalus (HCC) 2022   shunted   Osteoporosis    Personal history of  chemotherapy 1995   BREAST CA   Personal history of radiation therapy 1995   BREAST CA   Sjogren's syndrome    Vitamin D deficiency    ***  HOME MEDICATIONS  PTA Medications  Medication Sig   Multiple Vitamin (MULTIVITAMIN) tablet Take 1 tablet by mouth daily.   raloxifene (EVISTA) 60 MG tablet Take 60 mg by mouth daily.   omeprazole (PRILOSEC) 20 MG capsule Take 20 mg by mouth daily.   atorvastatin (LIPITOR) 80 MG tablet Take 80 mg by mouth daily.   mirtazapine (REMERON) 7.5 MG tablet Take 7.5 mg by mouth at bedtime.   QUEtiapine (SEROQUEL) 25 MG tablet Take 12.5 mg by mouth 2 (two) times daily as needed (anxiety/panic symptoms).   traZODone (DESYREL) 150 MG tablet Take 150 mg by mouth at bedtime.   omega-3 acid ethyl esters (LOVAZA) 1 g capsule Take 1 g by mouth daily. (Patient not taking: Reported on 02/08/2024)   psyllium (METAMUCIL) 58.6 % packet Take 1 packet by mouth daily as needed.  (Patient not taking: Reported on 02/08/2024)   docusate sodium (COLACE) 100 MG capsule Take 100 mg by mouth daily. (Patient not taking: Reported on 02/08/2024)   NON FORMULARY Take 1 mL by mouth daily.   Probiotic Product (PROBIOTIC-10) CHEW Chew by mouth. (Patient not taking: Reported on 02/08/2024)   aspirin EC 81 MG tablet Take 81 mg by mouth daily. (Patient not taking: Reported on 02/08/2024)   ***  ALLERGIES  Allergies[1]  SOCIAL & SUBSTANCE USE HISTORY  Social History   Socioeconomic History   Marital status: Divorced    Spouse name: Not on file   Number of children: Not on file   Years of education: Not on file   Highest education level: Not on file  Occupational History   Not on file  Tobacco Use   Smoking status: Former    Current packs/day: 0.00    Average packs/day: 0.5 packs/day for 48.0 years (24.0 ttl pk-yrs)    Types: Cigarettes    Start date: 02/09/1962    Quit date: 02/09/2010    Years since quitting: 14.0   Smokeless tobacco: Never  Vaping Use    Vaping status: Never Used  Substance and Sexual Activity   Alcohol use: Yes    Comment: occ. wine maybe 1  month   Drug use: No   Sexual activity: Not on file  Other Topics Concern   Not on file  Social History Narrative   Not on file   Social Drivers of Health   Tobacco Use: Medium Risk (01/31/2024)   Received [facility]   Patient History    Passive Exposure: Never    Smokeless Tobacco Use: Never    Smoking Tobacco Use: Former  Programmer, Applications: Patient Declined (10/15/2023)   Received from [facility]   Overall Financial Resource Strain (CARDIA)    How hard is it for you to pay for the very basics like food, housing, medical care, and heating?: Patient declined  Recent Concern: Financial Resource Strain - Medium Risk (09/23/2023)   Received from [facility]   Overall Financial Resource Strain (CARDIA)    Difficulty of Paying Living Expenses: Somewhat hard  Food Insecurity: Patient Declined (10/15/2023)   Received from [facility]   Epic    Within the past 12 months, you worried that your food would run out before you got the money to buy more.: Patient declined    Within the past 12 months, the food you bought just didn't last and you didn't have money to get more.: Patient declined  Transportation Needs: No Transportation Needs (10/15/2023)   Received from [facility]   PRAPARE - Transportation    Lack of Transportation (Medical): No    Lack of Transportation (Non-Medical): No  Recent Concern: Transportation Needs - Unmet Transportation Needs (09/23/2023)   Received from [facility] - Transportation    In the past 12 months, has lack of transportation kept you from medical appointments or from getting medications?: Yes    Lack of Transportation (Non-Medical): Yes  Physical Activity: Not on file  Stress: Not on file  Social Connections: Not on file  Depression (EYV7-0): Not on file   Alcohol Screen: Not on file  Housing: Low Risk  (09/23/2023)   Received from [facility]   Epic    In the last 12 months, was there a time when you were not able to pay the mortgage or rent on time?: No    In the past 12 months, how many times have you moved where you were living?: 0    At any time in the past 12 months, were you homeless or living in a shelter (including now)?: No  Utilities: Low Risk (10/15/2023)   Received from [facility]   Utilities    Within the past 12 months, have you been unable to get utilities(heat,  electricity) when it was really needed?: No  Health Literacy: Not on file   Tobacco Use History[2] Social History   Substance and Sexual Activity  Alcohol Use Yes   Comment: occ. wine maybe 1  month   Social History   Substance and Sexual Activity  Drug Use No    Additional pertinent information ***.  FAMILY HISTORY  Family History  Problem Relation Age of Onset   Breast cancer Neg Hx    Family Psychiatric History (if known):  ***  MENTAL STATUS EXAM (MSE)  Mental Status Exam: General Appearance: {Appearance:22683}  Orientation:  {BHH ORIENTATION (PAA):22689}  Memory:  {BHH MEMORY:22881}  Concentration:  {Concentration:21399}  Recall:  {BHH GOOD/FAIR/POOR:22877}  Attention  {BH Attention Span:31825}  Eye Contact:  {BHH EYE CONTACT:22684}  Speech:  {Speech:22685}  Language:  {BHH GOOD/FAIR/POOR:22877}  Volume:  {Volume (PAA):22686}  Mood: ***  Affect:  {Affect (PAA):22687}  Thought Process:  {Thought Process (PAA):22688}  Thought Content:  {Thought Content:22690}  Suicidal Thoughts:  {ST/HT (PAA):22692}  Homicidal Thoughts:  {ST/HT (PAA):22692}  Judgement:  {Judgement (PAA):22694}  Insight:  {Insight (PAA):22695}  Psychomotor Activity:  {Psychomotor (PAA):22696}  Akathisia:  {BHH YES OR NO:22294}  Fund of Knowledge:  {BHH GOOD/FAIR/POOR:22877}    Assets:  {Assets (PAA):22698}  Cognition:  {chl bhh  cognition:304700322}  ADL's:  {BHH JIO'D:77709}  AIMS (if indicated):       VITALS  Blood pressure 138/66, pulse 95, temperature 97.9 F (36.6 C), temperature source Axillary, resp. rate 18, height 5' 5 (1.651 m), weight 49.9 kg, SpO2 97%.  LABS  Admission on 02/08/2024  Component Date Value Ref Range Status   Sodium 02/08/2024 139  135 - 145 mmol/L Final   Potassium 02/08/2024 3.5  3.5 - 5.1 mmol/L Final   Chloride 02/08/2024 103  98 - 111 mmol/L Final   CO2 02/08/2024 25  22 - 32 mmol/L Final   Glucose, Bld 02/08/2024 105 (H)  70 - 99 mg/dL Final   Glucose reference range applies only to samples taken after fasting for at least 8 hours.   BUN 02/08/2024 23  8 - 23 mg/dL Final   Creatinine, Ser 02/08/2024 0.78  0.44 - 1.00 mg/dL Final   Calcium 87/69/7974 9.7  8.9 - 10.3 mg/dL Final   Total Protein 87/69/7974 6.7  6.5 - 8.1 g/dL Final   Albumin 87/69/7974 4.0  3.5 - 5.0 g/dL Final   AST 87/69/7974 20  15 - 41 U/L Final   ALT 02/08/2024 21  0 - 44 U/L Final   Alkaline Phosphatase 02/08/2024 95  38 - 126 U/L Final   Total Bilirubin 02/08/2024 0.3  0.0 - 1.2 mg/dL Final   GFR, Estimated 02/08/2024 >60  >60 mL/min Final   Comment: (NOTE) Calculated using the CKD-EPI Creatinine Equation (2021)    Anion gap 02/08/2024 11  5 - 15 Final   Performed at [facility], [address] [place], [place] [address]   Alcohol, Ethyl (B) 02/08/2024 <15  <15 mg/dL Final   Comment: (NOTE) For medical purposes only. Performed at [facility], [address] [place], [place] [address]    WBC 02/08/2024 10.4  4.0 - 10.5 K/uL Final   RBC 02/08/2024 3.58 (L)  3.87 - 5.11 MIL/uL Final   Hemoglobin 02/08/2024 10.8 (L)  12.0 - 15.0 g/dL Final   HCT 87/69/7974 32.1 (L)  36.0 - 46.0 % Final   MCV 02/08/2024 89.7  80.0 - 100.0 fL Final   MCH 02/08/2024 30.2  26.0 - 34.0 pg Final   MCHC 02/08/2024 33.6  30.0 - 36.0 g/dL Final   RDW 87/69/7974 13.9  11.5 -  15.5 % Final   Platelets 02/08/2024 513 (H)  150 - 400 K/uL Final   nRBC 02/08/2024 0.0  0.0 - 0.2 % Final   Performed at [facility], [address] [place], [place] [address]   Opiates 02/08/2024 POSITIVE (A)  NEGATIVE Final   Cocaine 02/08/2024 NEGATIVE  NEGATIVE Final   Benzodiazepines 02/08/2024 NEGATIVE  NEGATIVE Final   Amphetamines 02/08/2024 NEGATIVE  NEGATIVE Final   Tetrahydrocannabinol 02/08/2024 NEGATIVE  NEGATIVE Final   Barbiturates 02/08/2024 NEGATIVE  NEGATIVE Final   Methadone Scn, Ur 02/08/2024 NEGATIVE  NEGATIVE Final   Fentanyl  02/08/2024 NEGATIVE  NEGATIVE Final   Comment: (NOTE) Drug screen is for Medical Purposes only. Positive results are preliminary only. If confirmation is needed, notify lab within 5 days.  Drug Class                 Cutoff (ng/mL) Amphetamine and metabolites 1000 Barbiturate and metabolites 200 Benzodiazepine              200 Opiates and metabolites     300 Cocaine and metabolites     300 THC                         50 Fentanyl                     5 Methadone                   300  Trazodone is metabolized in vivo to several metabolites,  including pharmacologically active m-CPP, which is excreted in the  urine.  Immunoassay screens for amphetamines and MDMA have potential  cross-reactivity with these compounds and may provide false positive  result.  Performed at [facility], [address] [place], [place] [address]     PSYCHIATRIC REVIEW OF SYSTEMS (ROS)  ROS: Notable for the following relevant positive findings: Review of Systems  Respiratory: Negative.    Cardiovascular: Negative.   Gastrointestinal: Negative.   Genitourinary: Negative.   Musculoskeletal: Negative.   Neurological: Negative.   Endo/Heme/Allergies: Negative.   Psychiatric/Behavioral:  Positive for depression, memory loss and suicidal ideas. The patient is nervous/anxious.     Additional findings:      Musculoskeletal:  {Musculoskeletal neeeds/assessment:304550014}      Gait & Station: {Gait and Station:304550016}      Pain Screening: {Pain Description:304550015}      Nutrition & Dental Concerns: {Nutrition & Dental Concerns:304550017}  RISK FORMULATION/ASSESSMENT  Is the patient experiencing any suicidal or homicidal ideations: {yes/no:20286}       Explain if yes: *** Protective factors considered for safety management: ***  Risk factors/concerns considered for safety management: *** {CHL BH Risk Factors Safety Management:304550011}  Is there a safety management plan with the patient and treatment team to minimize risk factors and promote protective factors: {yes/no:20286}           Explain: *** Is crisis care placement or psychiatric hospitalization recommended: {yes/no:20286}     Based on my current evaluation and risk assessment, patient is determined at this time to be at:  {Risk level:304550009}  *RISK ASSESSMENT Risk assessment is a dynamic process; it is possible that this patient's condition, and risk level, may change. This should be re-evaluated and managed over time as appropriate. Please re-consult psychiatric consult services  if additional assistance is needed in terms of risk assessment and management. If your team decides to discharge this patient, please advise the patient how to best access emergency psychiatric services, or to call 911, if their condition worsens or they feel unsafe in any way.   Adriana JINNY Pontes, MD [name] Consult [facility]       [1] Allergies Allergen Reactions   Escitalopram Other (See Comments)   Lansoprazole     Increased gerd  [2] Social History Tobacco Use  Smoking Status Former   Current packs/day: 0.00   Average packs/day: 0.5 packs/day for 48.0 years (24.0 ttl pk-yrs)   Types: Cigarettes   Start date: 02/09/1962   Quit date: 02/09/2010   Years since quitting: 14.0  Smokeless Tobacco Never

## 2024-02-08 NOTE — Consult Note (Signed)
 Karen [name] Consult Note  Patient Name: Karen [name] MRN: [id number] DOB: [birth year] DATE OF Consult: 02/08/2024 Consult Order details:  Orders (From admission, onward)     Start     Ordered   02/08/24 1623  CONSULT TO CALL ACT TEAM       Ordering Provider: Jacolyn Pae, MD  Provider:  (Not yet assigned)  Question:  Reason for Consult?  Answer:  Psych consult   02/08/24 1623   02/08/24 1623  IP CONSULT TO PSYCHIATRY       Ordering Provider: Jacolyn Pae, MD  Provider:  (Not yet assigned)  Question Answer Comment  Reason for consult: Other (see comments)   Comments: Self-harm      02/08/24 1623            PRIMARY PSYCHIATRIC DIAGNOSES   1.  Major Depression, Recurrent, Severe without Psychotic Symptoms 2.  Generalized Anxiety Disorder 3.  Chronic Posttraumatic Stress Disorder 4.   VP shunt, potential failure   RECOMMENDATIONS  Recommendations: Medication recommendations: Patient to continue on her current meds, which include Remeron 7.5 mg at bedtime for depression/anxiety; Seroquel, 12.5 mg bid for mood-related anxiety; and trazodone, 150 mg at bedtime for depression/anxiety/sleep.  PRN:  Seroquel, 12.5 mg po q6h PRN mood-related anxiety/agitation Non-Medication/therapeutic recommendations: Given patient's level of anxiety and some cognitive challenges, recommend that she continue with constant observation, as per ED protocol, until she can be admitted safely to Psychiatry.  Continue with matter-of-fact emotional support in ED, pending transfer Is inpatient psychiatric hospitalization recommended for this patient? No (Explain why): YES.  (No is no longer correct, but cannot be changed in template)  After extended discussion with patient's daughter, and further discussion with patient, it appears that she has been feeling quite agitated and dysregulated, as well as has been having cognitive challenges that are making it difficult for her to monitor  behaviors.  Unclear that she has been taking meds regularly, and patient herself does admit that this was a suicide attempt.  Given her instability with mood and cognition, with a suicide attempt resulting, I do believe that she meets criteria for inpatient admission for stabilization and treatment, even if she is not acutely suicidal at this point.  Concern is that she will worsen from a MSE standpoint and become dangerous to herself again.  Patient is willing to sign in voluntarily. Is another care setting recommended for this patient? (examples may include Crisis Stabilization Unit, Residential/Recovery Treatment, ALF/SNF, Memory Care Unit)  No (Explain why): As above From a psychiatric perspective, is this patient appropriate for discharge to an outpatient setting/resource or other less restrictive environment for continued care?  Yes (Explain why): NO.  (Yes is no longer correct, as above, but cannot be changed in template).  See above  Follow-Up [name] C/L services: We will sign off for now. Please re-consult our service if needed for any concerning changes in the patient's condition, discharge planning, or questions. Communication: Treatment team members (and family members if applicable) who were involved in treatment/care discussions and planning, and with whom we spoke or engaged with via secure text/chat, include the following: Secure message sent to Drs. Karen [name], ED attendings, and staff, outlining current recommendations.  I personally spent a total of 75 minutes in the care of the patient today including preparing to see the patient, getting/reviewing separately obtained history, performing a medically appropriate exam/evaluation, counseling and educating, referring and communicating with other health care professionals, documenting clinical information in the EHR, and independently  interpreting results.  Thank you for involving us  in the care of this patient. If you have  any additional questions or concerns, please call [phone] and ask for the provider on-call.   [name] ATTESTATION & CONSENT   As the provider for this telehealth consult, I attest that I verified the patients identity using two separate identifiers, introduced myself to the patient, provided my credentials, disclosed my location, and performed this encounter via a HIPAA-compliant, real-time, face-to-face, two-way, interactive audio and video platform and with the full consent and agreement of the patient (or guardian as applicable.)   Extended discussion, with patient's permission, with her daughter, Karen [name] ([phone]), with discussion of collateral information, assessment, and recommendations  Patient physical location: ED, [facility]. Telehealth provider physical location: home office in state of [place] .  Video start time: 2210h EST  Video end time: 2230h EST  2nd video:  2330h EST, 2340h EST   IDENTIFYING DATA  Karen [name] is a 68 y.o. year-old female for whom a psychiatric consultation has been ordered by the primary provider. The patient was identified using two separate identifiers.  CHIEF COMPLAINT/REASON FOR CONSULT   I'm so embarrassed.  I got quite overwhelmed with anxiety last night, and I cut myself on the arm.  But I do not at all wish to die and wish to seek help.   HISTORY OF PRESENT ILLNESS (HPI)   The patient presents with long history of depression and anxiety, which has been made worse over past several years by multiple medical problems, including normal pressure hydrocephalus with VP shunt.  Patient also with history of signifcant early childhood trauma, with a history of EMDR treatment in [place] .  Patient has been out of therapy since being here in [place] for past ten years.  Had done fairly well, but depression and anxiety sx's worsened, and her meds were changed to those above just last week.  Also, some concern about the  functioning of the shunt, adjustments made last week.  In the ensuing week, however, the patient has had marked agitation because of fears that she is not doing well cognitively and emotionally.  Daughter has been staying with her, and patient has been struggling to take her medications and not to become overwhelmed.  As a result of increasing emotional pressure, she did cut herself yesterday, and she admits that she was thinking of killing herself at the time.  Her acute suicidal ideation has abated, but her mood dysregulation and cognitive slowing remain major factors, and patient continue to be at risk of exacerbation of her depression, with suicide attempts again.  SABRABAL negative.  UDS positive for opiates (apparently prescribed at one point)   PAST PSYCHIATRIC HISTORY  AS above Otherwise as per HPI above.  PAST MEDICAL HISTORY  Past Medical History:  Diagnosis Date   Anemia    Anxiety    Arthritis    Breast cancer (HCC) 1995   left breast cancer - chemo and modified mastectomy   Depression    Dyspnea    Dysrhythmia    Essential thrombocytosis (HCC)    GERD (gastroesophageal reflux disease)    Heart murmur    History of hiatal hernia    Hyperlipidemia    Hyperthyroidism    Mitral valve prolapse 1997   Normal pressure hydrocephalus (HCC) 2022   shunted   Osteoporosis    Personal history of chemotherapy 1995   BREAST CA   Personal history of radiation therapy 1995   BREAST CA  Sjogren's syndrome    Vitamin D deficiency      HOME MEDICATIONS  PTA Medications  Medication Sig   Multiple Vitamin (MULTIVITAMIN) tablet Take 1 tablet by mouth daily.   raloxifene (EVISTA) 60 MG tablet Take 60 mg by mouth daily.   omeprazole (PRILOSEC) 20 MG capsule Take 20 mg by mouth daily.   atorvastatin (LIPITOR) 80 MG tablet Take 80 mg by mouth daily.   mirtazapine (REMERON) 7.5 MG tablet Take 7.5 mg by mouth at bedtime.   QUEtiapine (SEROQUEL) 25 MG tablet Take 12.5 mg by mouth 2 (two) times  daily as needed (anxiety/panic symptoms).   traZODone (DESYREL) 150 MG tablet Take 150 mg by mouth at bedtime.   omega-3 acid ethyl esters (LOVAZA) 1 g capsule Take 1 g by mouth daily. (Patient not taking: Reported on 02/08/2024)   psyllium (METAMUCIL) 58.6 % packet Take 1 packet by mouth daily as needed.  (Patient not taking: Reported on 02/08/2024)   docusate sodium (COLACE) 100 MG capsule Take 100 mg by mouth daily. (Patient not taking: Reported on 02/08/2024)   NON FORMULARY Take 1 mL by mouth daily.   Probiotic Product (PROBIOTIC-10) CHEW Chew by mouth. (Patient not taking: Reported on 02/08/2024)   aspirin EC 81 MG tablet Take 81 mg by mouth daily. (Patient not taking: Reported on 02/08/2024)     ALLERGIES  Allergies[1]  SOCIAL & SUBSTANCE USE HISTORY  Social History   Socioeconomic History   Marital status: Divorced    Spouse name: Not on file   Number of children: Not on file   Years of education: Not on file   Highest education level: Not on file  Occupational History   Not on file  Tobacco Use   Smoking status: Former    Current packs/day: 0.00    Average packs/day: 0.5 packs/day for 48.0 years (24.0 ttl pk-yrs)    Types: Cigarettes    Start date: 02/09/1962    Quit date: 02/09/2010    Years since quitting: 14.0   Smokeless tobacco: Never  Vaping Use   Vaping status: Never Used  Substance and Sexual Activity   Alcohol use: Yes    Comment: occ. wine maybe 1  month   Drug use: No   Sexual activity: Not on file  Other Topics Concern   Not on file  Social History Narrative   Not on file   Social Drivers of Health   Tobacco Use: Medium Risk (01/31/2024)   Received [facility]   Patient History    Passive Exposure: Never    Smokeless Tobacco Use: Never    Smoking Tobacco Use: Former  Programmer, Applications: Patient Declined (10/15/2023)   Received from [facility]   Overall Financial Resource Strain (CARDIA)    How hard is it for you to pay for the  very basics like food, housing, medical care, and heating?: Patient declined  Recent Concern: Financial Resource Strain - Medium Risk (09/23/2023)   Received from [facility]   Overall Financial Resource Strain (CARDIA)    Difficulty of Paying Living Expenses: Somewhat hard  Food Insecurity: Patient Declined (10/15/2023)   [facility] from [facility]   Epic    Within the past 12 months, you worried that your food would run out before you got the money to buy more.: Patient declined    Within the past 12 months, the food you bought just didn't last and you didn't have money to get more.: Patient  declined  Transportation Needs: No Transportation Needs (10/15/2023)   Received from [facility] - Transportation    Lack of Transportation (Medical): No    Lack of Transportation (Non-Medical): No  Recent Concern: Transportation Needs - Unmet Transportation Needs (09/23/2023)   Received from [facility] - Transportation    In the past 12 months, has lack of transportation kept you from medical appointments or from getting medications?: Yes    Lack of Transportation (Non-Medical): Yes  Physical Activity: Not on file  Stress: Not on file  Social Connections: Not on file  Depression (EYV7-0): Not on file  Alcohol Screen: Not on file  Housing: Low Risk  (09/23/2023)   Received from [facility]   Epic    In the last 12 months, was there a time when you were not able to pay the mortgage or rent on time?: No    In the past 12 months, how many times have you moved where you were living?: 0    At any time in the past 12 months, were you homeless or living in a shelter (including now)?: No  Utilities: Low Risk (10/15/2023)   Received from [facility]   Utilities    Within the past 12 months, have you been unable to get utilities(heat, electricity) when it was really needed?: No  Health Literacy: Not on file   Tobacco Use  History[2] Social History   Substance and Sexual Activity  Alcohol Use Yes   Comment: occ. wine maybe 1  month   Social History   Substance and Sexual Activity  Drug Use No    Additional pertinent information Patient's daughter from [place]  is staying with her at the moment, while daughter who lives in [place] is out of town. SABRA  FAMILY HISTORY  Family History  Problem Relation Age of Onset   Breast cancer Neg Hx    Family Psychiatric History (if known):  History of mood disorder in family, with a distant relative (perhaps great-grandmother) have committed suicide.   MENTAL STATUS EXAM (MSE)  Mental Status Exam: General Appearance: Fairly Groomed  Orientation:  Full (Time, Place, and Person)  Memory:  Immediate;   Variable Recent;   Variable Remote;   Variable  Concentration:  Concentration: Poor and Attention Span: Poor  Recall:  Variable  Attention  Poor  Eye Contact:  Minimal  Speech:  Slow  Language:  Good  Volume:  Decreased  Mood: I can really be up and down and get distresed  Affect:  Labile  Thought Process:  Descriptions of Associations: Tangential  Thought Content:  Rumination and Tangential  Suicidal Thoughts:  Attempt yesterday, but denies intent now  Homicidal Thoughts:  No  Judgement:  Impaired  Insight:  Shallow  Psychomotor Activity:  Restlessness  Akathisia:  No  Fund of Knowledge:  Fair    Assets:  Manufacturing Systems Engineer Desire for Improvement Housing Social Support Talents/Skills  Cognition:  Impaired,  Mild (some memory issues and vocabulary)  ADL's:  Intact, but needing encouragement.  AIMS (if indicated):       VITALS  Blood pressure 138/66, pulse 95, temperature 97.9 F (36.6 C), temperature source Axillary, resp. rate 18, height 5' 5 (1.651 m), weight 49.9 kg, SpO2 97%.  LABS  Admission on 02/08/2024  Component Date Value Ref Range Status   Sodium 02/08/2024 139  135 - 145 mmol/L Final   Potassium 02/08/2024 3.5  3.5 - 5.1 mmol/L  Final   Chloride 02/08/2024 103  98 - 111 mmol/L Final   CO2 02/08/2024 25  22 - 32 mmol/L Final   Glucose, Bld 02/08/2024 105 (H)  70 - 99 mg/dL Final   Glucose reference range applies only to samples taken after fasting for at least 8 hours.   BUN 02/08/2024 23  8 - 23 mg/dL Final   Creatinine, Ser 02/08/2024 0.78  0.44 - 1.00 mg/dL Final   Calcium 87/69/7974 9.7  8.9 - 10.3 mg/dL Final   Total Protein 87/69/7974 6.7  6.5 - 8.1 g/dL Final   Albumin 87/69/7974 4.0  3.5 - 5.0 g/dL Final   AST 87/69/7974 20  15 - 41 U/L Final   ALT 02/08/2024 21  0 - 44 U/L Final   Alkaline Phosphatase 02/08/2024 95  38 - 126 U/L Final   Total Bilirubin 02/08/2024 0.3  0.0 - 1.2 mg/dL Final   GFR, Estimated 02/08/2024 >60  >60 mL/min Final   Comment: (NOTE) Calculated using the CKD-EPI Creatinine Equation (2021)    Anion gap 02/08/2024 11  5 - 15 Final   Performed at [facility], [address] [place], [place] [address]   Alcohol, Ethyl (B) 02/08/2024 <15  <15 mg/dL Final   Comment: (NOTE) For medical purposes only. Performed at [facility], [address] [place], [place] [address]    WBC 02/08/2024 10.4  4.0 - 10.5 K/uL Final   RBC 02/08/2024 3.58 (L)  3.87 - 5.11 MIL/uL Final   Hemoglobin 02/08/2024 10.8 (L)  12.0 - 15.0 g/dL Final   HCT 87/69/7974 32.1 (L)  36.0 - 46.0 % Final   MCV 02/08/2024 89.7  80.0 - 100.0 fL Final   MCH 02/08/2024 30.2  26.0 - 34.0 pg Final   MCHC 02/08/2024 33.6  30.0 - 36.0 g/dL Final   RDW 87/69/7974 13.9  11.5 - 15.5 % Final   Platelets 02/08/2024 513 (H)  150 - 400 K/uL Final   nRBC 02/08/2024 0.0  0.0 - 0.2 % Final   Performed at [facility], [address] [place], [place] [address]   Opiates 02/08/2024 POSITIVE (A)  NEGATIVE Final   Cocaine 02/08/2024 NEGATIVE  NEGATIVE Final   Benzodiazepines 02/08/2024 NEGATIVE  NEGATIVE Final   Amphetamines 02/08/2024 NEGATIVE  NEGATIVE Final   Tetrahydrocannabinol 02/08/2024 NEGATIVE   NEGATIVE Final   Barbiturates 02/08/2024 NEGATIVE  NEGATIVE Final   Methadone Scn, Ur 02/08/2024 NEGATIVE  NEGATIVE Final   Fentanyl  02/08/2024 NEGATIVE  NEGATIVE Final   Comment: (NOTE) Drug screen is for Medical Purposes only. Positive results are preliminary only. If confirmation is needed, notify lab within 5 days.  Drug Class                 Cutoff (ng/mL) Amphetamine and metabolites 1000 Barbiturate and metabolites 200 Benzodiazepine              200 Opiates and metabolites     300 Cocaine and metabolites     300 THC                         50 Fentanyl                     5 Methadone                   300  Trazodone is metabolized in vivo to several metabolites,  including pharmacologically active m-CPP, which is excreted  in the  urine.  Immunoassay screens for amphetamines and MDMA have potential  cross-reactivity with these compounds and may provide false positive  result.  Performed at [facility], [address] [place], [place] [address]     PSYCHIATRIC REVIEW OF SYSTEMS (ROS)  ROS: Notable for the following relevant positive findings: Review of Systems  Constitutional: Negative.   HENT: Negative.    Eyes: Negative.   Respiratory: Negative.    Cardiovascular: Negative.   Gastrointestinal: Negative.   Genitourinary: Negative.   Musculoskeletal: Negative.   Skin: Negative.   Neurological: Negative.   Endo/Heme/Allergies: Negative.   Psychiatric/Behavioral:  Positive for depression, memory loss and suicidal ideas. The patient is nervous/anxious.     Additional findings:      Musculoskeletal: No abnormal movements observed      Gait & Station: Laying/Sitting      Pain Screening: Denies      Nutrition & Dental Concerns:  Reviewed   RISK FORMULATION/ASSESSMENT  Is the patient experiencing any suicidal or homicidal ideations: No       Explain if yes:   HOWEVER, patient remains labile, and per family, is struggling to keep adequate awareness of  her thoughts.  Can still easily become overwhelmed and is at risk of continued lapse into suicidal thoughts until can be stabilized further.  Protective factors considered for safety management:   Family is supportive, but her current level of dysfunction is such that they cannot make sure that she can be kept safely at home  Risk factors/concerns considered for safety management:  Depression Physical illness/chronic pain Recent loss Access to lethal means Impulsivity Isolation Barriers to accessing treatment Unmarried  Is there a safety management plan with the patient and treatment team to minimize risk factors and promote protective factors: No           Explain:   As above, family unable to provide consistent structure to allow for adequate safety as an outpatient.  Is crisis care placement or psychiatric hospitalization recommended: Yes     Based on my current evaluation and risk assessment, patient is determined at this time to be at:  High risk  *RISK ASSESSMENT Risk assessment is a dynamic process; it is possible that this patient's condition, and risk level, may change. This should be re-evaluated and managed over time as appropriate. Please re-consult psychiatric consult services if additional assistance is needed in terms of risk assessment and management. If your team decides to discharge this patient, please advise the patient how to best access emergency psychiatric services, or to call 911, if their condition worsens or they feel unsafe in any way.   Adriana JINNY Pontes, MD [name]     [1]  Allergies Allergen Reactions   Escitalopram Other (See Comments)   Lansoprazole     Increased gerd  [2]  Social History Tobacco Use  Smoking Status Former   Current packs/day: 0.00   Average packs/day: 0.5 packs/day for 48.0 years (24.0 ttl pk-yrs)   Types: Cigarettes   Start date: 02/09/1962   Quit date: 02/09/2010   Years since quitting: 14.0   Smokeless Tobacco Never

## 2024-02-08 NOTE — BH Assessment (Signed)
 Psych consult has been placed with [facility] for patient to be seen.

## 2024-02-08 NOTE — Discharge Instructions (Addendum)
 Keep the area of the wounds clean and dry.  You can apply a dressing to keep it covered.  The Dermabond (the glue) will start to flake off and will come off on its own within several days.  You may apply bacitracin and/or Neosporin ointment to the wounds once the Dermabond has come off.  Return to the ER for new, worsening, or persistent severe pain from the wounds, redness or swelling appearing around the wounds, pus drainage or liquid draining out of the wounds with a bad odor, or any other new or worsening symptoms that concern you.  You should also return at any time if you have any new or worsening mental health concerns especially any thoughts of wanting to harm yourself or others.

## 2024-02-08 NOTE — ED Notes (Signed)
 Pt moved to room BHU1. Pt oriented to unit and rules explained. Pt appears very anxious but no distress noted at this time. Report received by previous RN.

## 2024-02-08 NOTE — ED Notes (Signed)
 VOL/  PENDING  CONSULT

## 2024-02-08 NOTE — ED Notes (Signed)
 Pt has 3 lacerations to left wrist.  Pt cut self with a box cutter last night.  Bleeding controlled.  Pt was having thoughts of hurting self.  Pt denies Si today.  Pt states her daughter called for help and she ended up here in the Er today.  Pt denies etoh or drug use.  Pt reports that a lot has happened building up to cutting wrist..    pt states she is ashamed for what she did to herself.  Pt is calm and cooperative  pt in hallway bed.

## 2024-02-08 NOTE — ED Notes (Signed)
 Ipad taken into pt room for follow up questions with MD. Pt alert and cooperative at this time.

## 2024-02-08 NOTE — ED Triage Notes (Signed)
 BIB [facility].  C/O 2 inch self inflicted cuts to left wrist.  Patient cut self last night. Bleeding controlled.  VS wnl.  Hx shunt replaced 2 weeks ago.

## 2024-02-08 NOTE — ED Notes (Signed)
 TTS at the bedside with [facility] ipad for pt evaluation

## 2024-02-08 NOTE — ED Provider Notes (Addendum)
 "  [facility] Provider Note    Event Date/Time   First MD Initiated Contact with Patient 02/08/24 1530     (approximate)   History   Suicide Attempt   HPI  Karen [name] is a 68 y.o. female with history of Graves' disease, mild proliferative neoplasm, NPH status post VP shunt placement, mitral prolapse, breast cancer, MDD, PTSD, bipolar disorder, and anxiety who presents with an episode of self-harm.  The patient states that she has been feeling overwhelmed recently due to multiple stressors including her medical conditions, and last night cut herself on her left wrist.  She states that she feels extremely embarrassed that this happened.  She denies feeling actively suicidal at this time.  She states that she has never tried to harm her self before.  She denies any acute medical complaints.  I reviewed the past medical records.  The patient was most recently evaluated by outpatient psychiatry at [facility] on 12/22 due to worsening anxiety and insomnia.  She had switched from citalopram to escitalopram.  She was started on Remeron and Seroquel at that time.  She was continued on trazodone for insomnia.   Physical Exam   Triage Vital Signs: ED Triage Vitals  Encounter Vitals Group     BP 02/08/24 1509 133/75     Girls Systolic BP Percentile --      Girls Diastolic BP Percentile --      Boys Systolic BP Percentile --      Boys Diastolic BP Percentile --      Pulse Rate 02/08/24 1509 92     Resp 02/08/24 1509 14     Temp 02/08/24 1509 97.8 F (36.6 C)     Temp Source 02/08/24 1509 Oral     SpO2 02/08/24 1509 99 %     Weight 02/08/24 1508 110 lb (49.9 kg)     Height 02/08/24 1508 5' 5 (1.651 m)     Head Circumference --      Peak Flow --      Pain Score 02/08/24 1508 0     Pain Loc --      Pain Education --      Exclude from Growth Chart --     Most recent vital signs: Vitals:   02/08/24 1509 02/08/24 2039  BP: 133/75 138/66  Pulse: 92 95  Resp: 14  18  Temp: 97.8 F (36.6 C) 97.9 F (36.6 C)  SpO2: 99% 97%     General: Awake, no distress.  CV:  Good peripheral perfusion.  Resp:  Normal effort.  Abd:  No distention.  Other:  Left volar wrist with several superficial lacerations, all with granulation tissue, healing appropriately.   ED Results / Procedures / Treatments   Labs (all labs ordered are listed, but only abnormal results are displayed) Labs Reviewed  COMPREHENSIVE METABOLIC PANEL WITH GFR - Abnormal; Notable for the following components:      Result Value   Glucose, Bld 105 (*)    All other components within normal limits  CBC - Abnormal; Notable for the following components:   RBC 3.58 (*)    Hemoglobin 10.8 (*)    HCT 32.1 (*)    Platelets 513 (*)    All other components within normal limits  URINE DRUG SCREEN - Abnormal; Notable for the following components:   Opiates POSITIVE (*)    All other components within normal limits  ETHANOL     EKG     RADIOLOGY  PROCEDURES:  Critical Care performed: No  .Laceration Repair  Date/Time: 02/08/2024 8:44 PM  Performed by: Jacolyn Pae, MD Authorized by: Jacolyn Pae, MD   Consent:    Consent obtained:  Verbal   Consent given by:  Patient   Alternatives discussed:  No treatment Universal protocol:    Patient identity confirmed:  Verbally with patient Anesthesia:    Anesthesia method:  None Laceration details:    Location: Left wrist.   Length (cm):  3 Treatment:    Amount of cleaning:  Standard Skin repair:    Repair method:  Tissue adhesive Approximation:    Approximation:  Close Repair type:    Repair type:  Simple    MEDICATIONS ORDERED IN ED: Medications - No data to display   IMPRESSION / MDM / ASSESSMENT AND PLAN / ED COURSE  I reviewed the triage vital signs and the nursing notes.  68 year old female with PMH as noted above presents after an episode of self-harm sometime last night when she attempted to  cut herself on her left wrist.  She feels embarrassed and denies active suicidal ideation at this time.  Differential diagnosis includes, but is not limited to, major depressive disorder, adjustment disorder, substance-induced mood disorder.  Patient's presentation is most consistent with acute presentation with potential threat to life or bodily function.  Lab workup was obtained for medical clearance and is unremarkable.  CMP and CBC show no acute findings.  Urine drug screen is positive for opiates.  Ethanol level is negative.  The patient has several superficial lacerations to the left wrist.  These all occurred sometime last night, so well over 12 hours and likely closer to 18 hours ago.  They all appear to be healing well with some granulation tissue forming.  At this time there is no indication for primary closure except for one small 3 cm wound that is still slightly open and would likely be amenable to tissue glue.  I have ordered psychiatry and TTS consults for further evaluation.  ----------------------------------------- 10:40 PM on 02/08/2024 -----------------------------------------  The wound was successfully repaired with Dermabond.  I also applied a thin layer of Dermabond to two of the other superficial lacerations that were already healing, for loose approximation, to keep tension off them and as a covering.    Psychiatry consult is pending.  The patient will be signed out to the oncoming ED provider at 11 PM.  ----------------------------------------- 11:21 PM on 02/08/2024 -----------------------------------------  I consulted and discussed the case with Dr. Drury from psychiatry who has evaluated the patient.  He initially advised that based on his assessment, the patient is at overall low risk for further self-harm and might be appropriate for discharge home with outpatient management.  However, after obtaining collateral from the patient's daughters, there is some  concerns about cognitive issues and changing medications.  He would like to have the patient either observed further in the ED and reevaluate in the morning or potentially voluntarily admitted.  I have signed the patient out to the oncoming ED physician Dr. Gordan.   FINAL CLINICAL IMPRESSION(S) / ED DIAGNOSES   Final diagnoses:  Self-inflicted laceration of left wrist (HCC)     Rx / DC Orders   ED Discharge Orders     None        Note:  This document was prepared using Dragon voice recognition software and may include unintentional dictation errors.    Jacolyn Pae, MD 02/08/24 2241    Jacolyn Pae, MD 02/08/24  2312    Jacolyn Pae, MD 02/08/24 2322  "

## 2024-02-08 NOTE — ED Triage Notes (Addendum)
 Pt cut her L wrist using a box cutter with the intent of killing herself. Pt reports this is the first time she has ever done this. Pt states, I'm almost 69, I have a lot of medical conditions, and I'm tired. Pt says she has been navigating depression since she was a child. 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 Pt's daughter called EMS from [place]  to have patient evaluated. Unknown if UTD on tetanus.

## 2024-02-09 ENCOUNTER — Inpatient Hospital Stay
Admission: AD | Admit: 2024-02-09 | Discharge: 2024-02-21 | DRG: 885 | Disposition: A | Discharge status: Home / Self Care | Source: Intra-hospital | Attending: Psychiatry | Admitting: Psychiatry

## 2024-02-09 ENCOUNTER — Emergency Department

## 2024-02-09 ENCOUNTER — Other Ambulatory Visit: Payer: Self-pay

## 2024-02-09 DIAGNOSIS — Z9012 Acquired absence of left breast and nipple: Secondary | ICD-10-CM

## 2024-02-09 DIAGNOSIS — Z79899 Other long term (current) drug therapy: Secondary | ICD-10-CM

## 2024-02-09 DIAGNOSIS — F4310 Post-traumatic stress disorder, unspecified: Secondary | ICD-10-CM | POA: Diagnosis present

## 2024-02-09 DIAGNOSIS — G912 (Idiopathic) normal pressure hydrocephalus: Secondary | ICD-10-CM | POA: Diagnosis present

## 2024-02-09 DIAGNOSIS — F4312 Post-traumatic stress disorder, chronic: Secondary | ICD-10-CM

## 2024-02-09 DIAGNOSIS — M3500 Sicca syndrome, unspecified: Secondary | ICD-10-CM | POA: Diagnosis present

## 2024-02-09 DIAGNOSIS — Z9889 Other specified postprocedural states: Secondary | ICD-10-CM

## 2024-02-09 DIAGNOSIS — Z888 Allergy status to other drugs, medicaments and biological substances status: Secondary | ICD-10-CM

## 2024-02-09 DIAGNOSIS — Z59868 Other specified financial insecurity: Secondary | ICD-10-CM

## 2024-02-09 DIAGNOSIS — K219 Gastro-esophageal reflux disease without esophagitis: Secondary | ICD-10-CM | POA: Diagnosis present

## 2024-02-09 DIAGNOSIS — X788XXA Intentional self-harm by other sharp object, initial encounter: Secondary | ICD-10-CM | POA: Diagnosis present

## 2024-02-09 DIAGNOSIS — E785 Hyperlipidemia, unspecified: Secondary | ICD-10-CM | POA: Diagnosis present

## 2024-02-09 DIAGNOSIS — F332 Major depressive disorder, recurrent severe without psychotic features: Secondary | ICD-10-CM | POA: Diagnosis present

## 2024-02-09 DIAGNOSIS — R4189 Other symptoms and signs involving cognitive functions and awareness: Secondary | ICD-10-CM | POA: Diagnosis present

## 2024-02-09 DIAGNOSIS — F411 Generalized anxiety disorder: Secondary | ICD-10-CM

## 2024-02-09 DIAGNOSIS — G4700 Insomnia, unspecified: Secondary | ICD-10-CM | POA: Diagnosis present

## 2024-02-09 DIAGNOSIS — R45851 Suicidal ideations: Secondary | ICD-10-CM | POA: Diagnosis present

## 2024-02-09 DIAGNOSIS — Z982 Presence of cerebrospinal fluid drainage device: Secondary | ICD-10-CM

## 2024-02-09 DIAGNOSIS — Z9221 Personal history of antineoplastic chemotherapy: Secondary | ICD-10-CM

## 2024-02-09 DIAGNOSIS — G479 Sleep disorder, unspecified: Secondary | ICD-10-CM | POA: Diagnosis present

## 2024-02-09 DIAGNOSIS — I341 Nonrheumatic mitral (valve) prolapse: Secondary | ICD-10-CM | POA: Diagnosis present

## 2024-02-09 DIAGNOSIS — Z9089 Acquired absence of other organs: Secondary | ICD-10-CM

## 2024-02-09 DIAGNOSIS — S61512A Laceration without foreign body of left wrist, initial encounter: Secondary | ICD-10-CM | POA: Diagnosis present

## 2024-02-09 DIAGNOSIS — F329 Major depressive disorder, single episode, unspecified: Principal | ICD-10-CM | POA: Diagnosis present

## 2024-02-09 DIAGNOSIS — Z853 Personal history of malignant neoplasm of breast: Secondary | ICD-10-CM

## 2024-02-09 DIAGNOSIS — M1990 Unspecified osteoarthritis, unspecified site: Secondary | ICD-10-CM | POA: Diagnosis present

## 2024-02-09 DIAGNOSIS — E0590 Thyrotoxicosis, unspecified without thyrotoxic crisis or storm: Secondary | ICD-10-CM | POA: Diagnosis present

## 2024-02-09 DIAGNOSIS — Z923 Personal history of irradiation: Secondary | ICD-10-CM

## 2024-02-09 DIAGNOSIS — M810 Age-related osteoporosis without current pathological fracture: Secondary | ICD-10-CM | POA: Diagnosis present

## 2024-02-09 DIAGNOSIS — F319 Bipolar disorder, unspecified: Principal | ICD-10-CM | POA: Diagnosis present

## 2024-02-09 DIAGNOSIS — E559 Vitamin D deficiency, unspecified: Secondary | ICD-10-CM | POA: Diagnosis present

## 2024-02-09 DIAGNOSIS — Z87891 Personal history of nicotine dependence: Secondary | ICD-10-CM

## 2024-02-09 LAB — RESP PANEL BY RT-PCR (RSV, FLU A&B, COVID)  RVPGX2
Influenza A by PCR: NEGATIVE
Influenza B by PCR: NEGATIVE
Resp Syncytial Virus by PCR: NEGATIVE
SARS Coronavirus 2 by RT PCR: NEGATIVE

## 2024-02-09 MED ORDER — ENSURE PLUS HIGH PROTEIN PO LIQD
237.0000 mL | Freq: Two times a day (BID) | ORAL | Status: DC
  Administered 2024-02-10 – 2024-02-21 (×18): 237 mL via ORAL

## 2024-02-09 MED ORDER — ALUM & MAG HYDROXIDE-SIMETH 200-200-20 MG/5ML PO SUSP: 30.0000 mL | ORAL | Status: DC | PRN

## 2024-02-09 MED ORDER — ATORVASTATIN CALCIUM 80 MG PO TABS
80.0000 mg | ORAL_TABLET | Freq: Every day | ORAL | Status: DC
  Administered 2024-02-09 – 2024-02-21 (×13): 80 mg via ORAL
  Filled 2024-02-09 (×13): qty 1

## 2024-02-09 MED ORDER — OLANZAPINE 5 MG PO TBDP: 5.0000 mg | ORAL_TABLET | Freq: Three times a day (TID) | ORAL | Status: DC | PRN

## 2024-02-09 MED ORDER — MIRTAZAPINE 15 MG PO TABS
7.5000 mg | ORAL_TABLET | Freq: Every day | ORAL | Status: DC
  Administered 2024-02-09 – 2024-02-10 (×2): 7.5 mg via ORAL
  Filled 2024-02-09 (×2): qty 1

## 2024-02-09 MED ORDER — QUETIAPINE FUMARATE 25 MG PO TABS
12.5000 mg | ORAL_TABLET | Freq: Two times a day (BID) | ORAL | Status: DC
  Administered 2024-02-09 – 2024-02-11 (×4): 12.5 mg via ORAL
  Filled 2024-02-09 (×4): qty 1

## 2024-02-09 MED ORDER — ADULT MULTIVITAMIN W/MINERALS CH
1.0000 | ORAL_TABLET | Freq: Every day | ORAL | Status: DC
  Administered 2024-02-09 – 2024-02-21 (×13): 1 via ORAL
  Filled 2024-02-09 (×13): qty 1

## 2024-02-09 MED ORDER — OLANZAPINE 10 MG IM SOLR: 5.0000 mg | Freq: Three times a day (TID) | INTRAMUSCULAR | Status: DC | PRN

## 2024-02-09 MED ORDER — ACETAMINOPHEN 325 MG PO TABS
650.0000 mg | ORAL_TABLET | Freq: Four times a day (QID) | ORAL | Status: DC | PRN
  Administered 2024-02-10 – 2024-02-20 (×5): 650 mg via ORAL
  Filled 2024-02-09 (×5): qty 2

## 2024-02-09 MED ORDER — TRAZODONE HCL 50 MG PO TABS
150.0000 mg | ORAL_TABLET | Freq: Every day | ORAL | Status: DC
  Administered 2024-02-09 – 2024-02-20 (×12): 150 mg via ORAL
  Filled 2024-02-09 (×12): qty 1

## 2024-02-09 MED ORDER — MAGNESIUM HYDROXIDE 400 MG/5ML PO SUSP: 30.0000 mL | Freq: Every day | ORAL | Status: DC | PRN

## 2024-02-09 MED ORDER — RALOXIFENE HCL 60 MG PO TABS
60.0000 mg | ORAL_TABLET | Freq: Every day | ORAL | Status: DC
  Administered 2024-02-09 – 2024-02-21 (×13): 60 mg via ORAL
  Filled 2024-02-09 (×13): qty 1

## 2024-02-09 NOTE — ED Provider Notes (Addendum)
----------------------------------------- °  1:37 AM on 02/09/2024 -----------------------------------------  Clinical Course as of 02/09/24 0225  Tue Feb 08, 2024  2354 Evaluated by psychiatry.  Dr. Parry had the opportunity to speak with the patient's daughter, and after obtaining collateral information, he feels the patient is appropriate for inpatient psychiatry hospitalization.  However, he also mentioned that the daughter reported the patient has a VP shunt to which adjustments were made within the last month or so, and the neurologist mentioned that she may need further evaluation if she does not get better.  Given the mental status change in the presence of a VP shunt, I will obtain a CT head to evaluate for the possibility of hydrocephalus, as well as a shunt series to try and verify that the shunt is working correct. [CF]  Wed Feb 09, 2024  0219 I independently viewed and interpret the patient's head CT as well as the shunt series of x-rays.  I can appreciate no acute abnormality including no evidence of hydrocephalus.  The radiology reports confirm no acute abnormalities identified and the shunt catheter appears intact. [CF]  0224 Of note, couple of the patient's lacerations on her left wrist had opened back up and were oozing some blood.  I reassessed them and reapplied Dermabond. [CF]    Clinical Course User Index [CF] Gordan Huxley, MD     ED ECG REPORT I, Huxley Gordan, the attending physician, personally viewed and interpreted this ECG.  Date: 02/09/2024 EKG Time: 0129 Rate: 97 Rhythm: normal sinus rhythm QRS Axis: normal Intervals: incomplete RBBB ST/T Wave abnormalities: normal Narrative Interpretation: no evidence of acute ischemia     Gordan Huxley, MD 02/09/24 9862    Gordan Huxley, MD 02/09/24 0225

## 2024-02-09 NOTE — BH Assessment (Signed)
 Patient has been accepted to [facility] 02/09/24   Patient assigned to room 32  Accepting physician is Shrivastata MD.   Call report to [phone]   Representative was [facility] Danika.       ER Staff is aware.

## 2024-02-09 NOTE — Telephone Encounter (Signed)
 Pts daughter called to speak to provider about a transfer to [facility] health.

## 2024-02-09 NOTE — Group Note (Signed)
 Date:  02/09/2024 Time:  3:41 PM  Group Topic/Focus:  Coping With Mental Health Crisis:   The purpose of this group is to help patients identify strategies for coping with mental health crisis.  Group discusses possible causes of crisis and ways to manage them effectively.    Participation Level:  Did Not Attend    Norleen SHAUNNA Bias 02/09/2024, 3:41 PM

## 2024-02-09 NOTE — ED Notes (Signed)
 Patient is resting comfortably.

## 2024-02-09 NOTE — Progress Notes (Addendum)
 Per TTS, [facility] contacted patient's daughter Teddi - [phone]) regarding placement concerns.   [facility] confirmed that pt has a bed available at [facility] and the plan is to move forward with psych admission today to prevent any delays in patient care.   [facility] also explained the placement process in detail.  Lauraine shared that she would speak with her sister and call back.    1:29PM: [facility] received a call from Dr. Duwaine Nation, patient's [facility] Psychiatrist.  Dr. Nation stated she is willing to consult with [facility] psychiatry once patient is admitted.  Dr. Nation shared that she would communicate with patient's daughter to explain that she is unable to expedite the transfer process, however the best plan is for patient to remain where she has a bed and she would collab with the [facility] psych staff.  Dr. Nation left her contact info for our psych providers.  Zelda NP, was updated.  1:58PM :  Lauraine called back and stated the family is now okay with patient's inpatient admission plan at [facility].  TTS was updated.  [place]ki, [facility] [phone]

## 2024-02-09 NOTE — ED Notes (Signed)
 Pt given snack and water at this time. Pt denies further needs at this time.

## 2024-02-09 NOTE — ED Notes (Addendum)
 ED MD at the bedside to evaluate pt wrist. Dermabond applied. Gauze bandage applied with tape.

## 2024-02-09 NOTE — ED Notes (Signed)
 RN assessed pt wound dressing to left wrist. Dressing intact, clean, and dry.

## 2024-02-09 NOTE — BH Assessment (Signed)
 TTS spoke with daughter ETTER Domino [phone]) regarding her request for pt to be sent to [facility] for placement. Domino reports that pt's entire care team is part of [facility] including her psychiatrist Dr. Duwaine Nation. TTS explained the process for getting pt transferred to [facility] if accepted. TTS informed daughter that requesting placement at [facility] is not guaranteed and will delay the pt receiving care. Daughter insisted that placement to [facility] be attempted until 5 pm today. Domino shared that if pt is not admitted to [facility] by 5 pm then she is to go to original placement at [facility].   TTS consulted with social work. Social worker ETTER LOIS Moats) reported that she will give daughter a call to provide further details about transfer process to [facility].

## 2024-02-09 NOTE — Telephone Encounter (Signed)
 Caller, Daughter Lauraine, states that she would like to speak with her mothers provider.   Caller states that the patient is at [facility] and she would like to touch base with the provider.   Caller, Lauraine, can be reached at [phone]  Please advise.  Thanks,  YASMIN

## 2024-02-09 NOTE — Plan of Care (Signed)
 Patient is a voluntary admission to [facility] for MDD and anxiety after cutting her left wrist .  Wrist treated in the ED - dermabond and dsd. Patient states she is still having thoughts of suicide but only because of everything that happened.  Contracts for safety.  Patients glasses lost the screw while on her way to the unit - glasses taped together. Patient has psychiatrist, oncologist and physician at [facility] which she keeps in regular contact with. Has never been admitted for psych. Endorses anxiety 9/10 and depression 7/10. Ambulates without assistance but states she has fallen 2x in the last few months. Walker provided. Also states she has lost over 20 lbs in the last few months.  Will continue to monitor.

## 2024-02-09 NOTE — ED Notes (Signed)
 Pt taken to Xray via wheelchair accompanied by ED tech and security.

## 2024-02-09 NOTE — ED Notes (Signed)
 Patient sleeping

## 2024-02-09 NOTE — ED Notes (Signed)
 Meal provided

## 2024-02-09 NOTE — ED Notes (Signed)
 vol/consult done/recommended for inpatient psychiatric admission.

## 2024-02-09 NOTE — ED Notes (Signed)
 Pt's daughter to ED to visit pt. Pt gave consent to speak with daughter about plan of care. Pt's daughter requesting pt to be transferred to [facility] facility due to pt's psych team being at [facility]. Rosina from TTS made aware and reports that she will contact Lauraine (pt's daughter).

## 2024-02-10 DIAGNOSIS — F332 Major depressive disorder, recurrent severe without psychotic features: Secondary | ICD-10-CM | POA: Diagnosis not present

## 2024-02-10 DIAGNOSIS — F411 Generalized anxiety disorder: Secondary | ICD-10-CM

## 2024-02-10 DIAGNOSIS — F4310 Post-traumatic stress disorder, unspecified: Secondary | ICD-10-CM | POA: Diagnosis not present

## 2024-02-10 DIAGNOSIS — R4588 Nonsuicidal self-harm: Secondary | ICD-10-CM | POA: Diagnosis not present

## 2024-02-10 LAB — HEMOGLOBIN A1C
Hgb A1c MFr Bld: 5.6 % (ref 4.8–5.6)
Mean Plasma Glucose: 114.02 mg/dL

## 2024-02-10 LAB — LIPID PANEL
Cholesterol: 143 mg/dL (ref 0–200)
HDL: 59 mg/dL
LDL Cholesterol: 69 mg/dL (ref 0–99)
Total CHOL/HDL Ratio: 2.4 ratio
Triglycerides: 74 mg/dL
VLDL: 15 mg/dL (ref 0–40)

## 2024-02-10 MED ORDER — LORAZEPAM 1 MG PO TABS
1.0000 mg | ORAL_TABLET | Freq: Four times a day (QID) | ORAL | Status: DC | PRN
  Administered 2024-02-10 – 2024-02-15 (×3): 1 mg via ORAL
  Filled 2024-02-10 (×3): qty 1

## 2024-02-10 NOTE — Group Note (Signed)
 Date:  02/10/2024 Time:  11:07 AM  Group Topic/Focus:  Making Healthy Choices:   The focus of this group is to help patients identify negative/unhealthy choices they were using prior to admission and identify positive/healthier coping strategies to replace them upon discharge.    Participation Level:  Active  Participation Quality:  Appropriate and Attentive  Affect:  Appropriate  Cognitive:  Alert  Insight: Appropriate  Engagement in Group:  Engaged  Modes of Intervention:  Activity  Additional Comments:     Maglione,Hartford Maulden E 02/10/2024, 11:07 AM

## 2024-02-10 NOTE — Group Note (Signed)
 Recreation Therapy Group Note   Group Topic:Goal Setting  Group Date: 02/10/2024 Start Time: 1330 End Time: 1405 Facilitators: Celestia Jeoffrey BRAVO, LRT, CTRS Location: Dayroom  Group Description: New Years Resolutions. Patients received a handout for them to write down their new years resolutions on it. The handout had a decorative border around the paper that patients could also color once they're finished writing their new year resolutions down. Afterwards, LRT encouraged patients to read out loud their new years resolutions to the group.   Goal Area(s) Addressed: Patient will increase communication,  Patient will identify a goal leading into the new year.  Patient will foster mutual support. Patient will express themselves through art.     Affect/Mood: N/A   Participation Level: Did not attend    Clinical Observations/Individualized Feedback: Patient did not attend.   Plan: Continue to engage patient in RT group sessions 2-3x/week.   Jeoffrey BRAVO Celestia, LRT, CTRS 02/10/2024 4:29 PM

## 2024-02-10 NOTE — Consult Note (Signed)
 WOC Nurse Consult Note: Patient was evaluated in ED for these self inflicted wounds and dermabond applied twice 12/30 and 12/31; will not use ointments on these wounds as ointments may break down dermabond further  Reason for Consult: hand wounds  Wound type: L anterior wrist full thickness wounds r/t self harm  Pressure Injury POA: NA  Measurement: see nursing flowsheet  Wound bed: 3 linear full thickness wounds; 2 superior appear to be approximated with the dermabond, the wound nearest the hand appears to have dehisced with pink tissue noted  Drainage (amount, consistency, odor) appears yellow on old dressing  Periwound: intact  Dressing procedure/placement/frequency: Cleanse L wrist wounds with NS, using a Q tip applicator apply a small strip of silver hydrofiber ([place] [id number] Aquacel [employer]) to open wound bed daily and cover with silicone foam.  May lift foam daily to replace silver.  Change foam q3 days and prn soiling.    POC discussed with bedside nurse. Appreciate DOROTHA Hamilton, RN assistance with this consult.  WOC team will not follow. REconsult if further needs arise.   Thank you,    Powell Bar MSN, RN-BC, [employer]

## 2024-02-10 NOTE — Plan of Care (Signed)
  Problem: Activity: Goal: Interest or engagement in activities will improve Outcome: Progressing Goal: Sleeping patterns will improve Outcome: Progressing   Problem: Coping: Goal: Ability to verbalize frustrations and anger appropriately will improve Outcome: Progressing Goal: Ability to demonstrate self-control will improve Outcome: Progressing   Problem: Safety: Goal: Periods of time without injury will increase Outcome: Progressing

## 2024-02-10 NOTE — Plan of Care (Signed)
   Problem: Education: Goal: Emotional status will improve Outcome: Not Progressing Goal: Mental status will improve Outcome: Not Progressing Goal: Verbalization of understanding the information provided will improve Outcome: Not Progressing

## 2024-02-10 NOTE — [facility] Suicide Risk Assessment (Signed)
 [facility] Admission Suicide Risk Assessment   Nursing information obtained from:  Patient Demographic factors:  Age 69 or older, Divorced or widowed, Caucasian Current Mental Status:  Suicidal ideation indicated by patient Loss Factors:  Decline in physical health Historical Factors:  NA Risk Reduction Factors:  Sense of responsibility to family  Total Time spent with patient: 45 minutes Principal Problem: MDD (major depressive disorder) Diagnosis:  Principal Problem:   MDD (major depressive disorder)  Subjective Data: Patient is a 69 year old female who reports that she was feeling extremely overwhelmed and depressed because of which she cut herself at this time still reports feeling depressed but denies any current suicidal thoughts.  Continued Clinical Symptoms:  Alcohol Use Disorder Identification Test Final Score (AUDIT): 0 The Alcohol Use Disorders Identification Test, Guidelines for Use in Primary Care, Second Edition.  World Science Writer [facility]). Score between 0-7:  no or low risk or alcohol related problems. Score between 8-15:  moderate risk of alcohol related problems. Score between 16-19:  high risk of alcohol related problems. Score 20 or above:  warrants further diagnostic evaluation for alcohol dependence and treatment.   CLINICAL FACTORS:   Depression:   Anhedonia   Musculoskeletal: Strength & Muscle Tone: within normal limits Gait & Station: normal Patient leans: N/A  Psychiatric Specialty Exam:  Patient is a 69 year old female who appears stated age alert oriented x 3 mood is okay affect is constricted denies any thoughts no perceptual disturbances Insight and judgment   Physical Exam: Physical Exam ROS Blood pressure 122/68, pulse 97, temperature (!) 97.5 F (36.4 C), resp. rate 16, height 5' 5 (1.651 m), weight 43.8 kg, SpO2 100%. Body mass index is 16.06 kg/m.   COGNITIVE FEATURES THAT CONTRIBUTE TO RISK:  Closed-mindedness    SUICIDE RISK:    Moderate:  Frequent suicidal ideation with limited intensity, and duration, some specificity in terms of plans, no associated intent, good self-control, limited dysphoria/symptomatology, some risk factors present, and identifiable protective factors, including available and accessible social support.  PLAN OF CARE: Will restart home medications suicidal monitoring  I certify that inpatient services furnished can reasonably be expected to improve the patient's condition.   Millie JONELLE Manners, MD 02/10/2024, 1:45 PM

## 2024-02-10 NOTE — Group Note (Signed)
 [facility] LCSW Group Therapy Note   Group Date: 02/10/2024 Start Time: 1300 End Time: 1330  Type of Therapy and Topic:  Group Therapy:  Feelings around Relapse and Recovery  Participation Level:  Did Not Attend   Description of Group:    Patients in this group will discuss emotions they experience before and after a relapse. They will process how experiencing these feelings, or avoidance of experiencing them, relates to having a relapse. Facilitator will guide patients to explore emotions they have related to recovery. Patients will be encouraged to process which emotions are more powerful. They will be guided to discuss the emotional reaction significant others in their lives may have to patients relapse or recovery. Patients will be assisted in exploring ways to respond to the emotions of others without this contributing to a relapse.  Therapeutic Goals: Patient will identify two or more emotions that lead to relapse for them:  Patient will identify two emotions that result when they relapse:  Patient will identify two emotions related to recovery:  Patient will demonstrate ability to communicate their needs through discussion and/or role plays.   Summary of Patient Progress: Patient did not attend group.   Therapeutic Modalities:   Cognitive Behavioral Therapy Solution-Focused Therapy Assertiveness Training Relapse Prevention Therapy   Nadara JONELLE Fam, LCSW

## 2024-02-10 NOTE — Progress Notes (Signed)
 D- Patient alert and oriented x 4. Pt presents with a sad mood and affect. Pt tearful when speaking with clinical research associate. Pt preoccupied and focused on wound. Pt picking at bandage. Education provided on the importance of not picking bandage or wound. MD made aware as pt wound is now open. Pt endorsing passive SI. Pt denies HI, AVH, and pain. Anxiety 8/10 and depression 6/10. Stress and anxiety reducing exercised.  Pt verbalized understanding.   A- Scheduled and PRN medications administered to patient, per MD orders. Support and encouragement provided.  Routine safety checks conducted every 15 minutes.  Patient informed to notify staff with problems or concerns.  R- No adverse drug reactions noted. Patient contracts for safety at this time. Patient compliant with medications and treatment plan. Patient receptive, calm, and cooperative. Patient did not interact with others on the unit except for meals.  Patient remains safe at this time.   Kala Ambriz S.,RN

## 2024-02-10 NOTE — Progress Notes (Signed)
 Wound photos uploaded to pt chart for would care consult per Powell Bar, RN request.   Jaheim Canino S.,RN

## 2024-02-10 NOTE — Progress Notes (Signed)
" °   02/10/24 2200  Psych Admission Type (Psych Patients Only)  Admission Status Voluntary  Psychosocial Assessment  Patient Complaints Anxiety  Eye Contact Brief  Facial Expression Anxious  Affect Anxious  Speech Soft  Interaction Guarded;Cautious  Motor Activity Fidgety  Appearance/Hygiene In scrubs  Behavior Characteristics Cooperative;Anxious  Mood Anxious  Thought Process  Coherency WDL  Content WDL  Delusions None reported or observed  Perception WDL  Hallucination None reported or observed  Judgment Impaired  Confusion None  Danger to Self  Current suicidal ideation? Denies  Self-Injurious Behavior No self-injurious ideation or behavior indicators observed or expressed   Agreement Not to Harm Self Yes  Description of Agreement Verbal  Danger to Others  Danger to Others None reported or observed    "

## 2024-02-10 NOTE — Group Note (Signed)
 Date:  02/10/2024 Time:  3:46 PM  Group Topic/Focus:  Goals Group:   The focus of this group is to help patients establish daily goals to achieve during treatment and discuss how the patient can incorporate goal setting into their daily lives to aide in recovery.    Participation Level:  Did Not Attend   Karen [name] 02/10/2024, 3:46 PM

## 2024-02-10 NOTE — Progress Notes (Signed)
 SI/SIB: denies Behavior/Mood:Alert and oriented. Anxious and fidgety. Endorses anxiety 8/10. Pt requesting dressing changed to left wrist.  Interaction/Group:min interaction with peers and staff. Attends group.  Medication/PRNs: po med compliant. No PRNs given Pain: denies Other: dsg changed to left wrist. Site cleansed, pat dry and covered with Mepilex foam dsg. Restrictive limb sleeve applied to left arm to alert staff from taking b/p in at risk arm. Slept 8.75 hours   02/09/24 2200  Psych Admission Type (Psych Patients Only)  Admission Status Voluntary  Psychosocial Assessment  Patient Complaints Anxiety  Eye Contact Brief  Facial Expression Anxious  Affect Apprehensive  Speech Soft  Interaction Assertive  Motor Activity Fidgety  Appearance/Hygiene Unremarkable  Behavior Characteristics Anxious  Mood Anxious  Thought Process  Coherency WDL  Content WDL  Delusions None reported or observed  Perception WDL  Hallucination None reported or observed  Judgment Impaired  Confusion None  Danger to Self  Current suicidal ideation? Denies  Self-Injurious Behavior No self-injurious ideation or behavior indicators observed or expressed   Agreement Not to Harm Self Yes  Description of Agreement verbal

## 2024-02-10 NOTE — H&P (Signed)
 CHIEF COMPLAINT / REASON FOR CONSULT  I was overwhelmed with everything going on in my life and my health, and I cut myself. Im embarrassed and depressed.  HISTORY OF PRESENT ILLNESS (HPI)  Karen [name] is a 69 year old female with a significant psychiatric history including major depressive disorder, bipolar disorder, PTSD, and anxiety, as well as multiple complex medical comorbidities, who presents to the emergency department after an episode of self-inflicted cutting to the left wrist.  The patient reports that last night she became overwhelmed by cumulative psychosocial and medical stressors, including recent changes to her psychiatric medications and concerns regarding her VP shunt (replaced approximately two weeks ago). In the setting of escalating anxiety, emotional distress, and feeling unable to cope, she cut her left wrist with a box cutter, resulting in three superficial lacerations approximately 2 inches in length. Bleeding was controlled prior to arrival. She reports that at the time of the incident she was experiencing thoughts of self-harm and admits that suicidal thoughts were present at that moment.  Today, she denies active suicidal ideation, intent, or plan, but continues to endorse persistent depressed mood, emotional overwhelm, anxiety, shame, and embarrassment regarding the self-harm behavior. She reports feeling mentally slowed and emotionally dysregulated. She denies homicidal ideation, auditory or visual hallucinations, and paranoia. She denies alcohol or illicit substance use.  Her daughter contacted emergency services, leading to the patients presentation to the ED. The daughter remains actively involved in care coordination and has requested possible transfer to [facility], where the patients outpatient psychiatric care is established.  PAST PSYCHIATRIC HISTORY  Diagnoses:  Major Depressive Disorder  Bipolar Disorder  Post-Traumatic Stress Disorder  Generalized  Anxiety Disorder  Prior suicide attempts: Denies prior attempts; this is her first episode of self-harm.  Prior self-injurious behavior: Denies.  Psychiatric hospitalizations: None reported.  Outpatient psychiatry: Followed at [facility] (Dr. Duwaine Nation).  Psychotherapy history: Extensive trauma history; prior EMDR therapy in [place] . Not currently engaged in therapy for several years.  PAST MEDICAL HISTORY  Normal Pressure Hydrocephalus, status post VP shunt placement (recent adjustment/replacement ~2 weeks ago)  Graves disease  Mild proliferative neoplasm  Mitral valve prolapse  Breast cancer (history)  Multiple chronic medical issues contributing to functional and emotional burden  MEDICATION HISTORY  Recent outpatient medication changes (12/22):  Discontinued: Citalopram  Started: Escitalopram  Started: Mirtazapine (Remeron)  Started: Quetiapine (Seroquel)  Continued: Trazodone for insomnia  Medication adherence has been inconsistent per patient due to feeling overwhelmed.  ALLERGIES  No known drug allergies reported.  SUBSTANCE USE HISTORY  Alcohol: Denies  Illicit drugs: Denies  Urine drug screen: Positive for opiates (reportedly prescribed previously)  BAL: Negative  FAMILY & SOCIAL HISTORY  Lives with family; daughter currently staying with her due to concerns.  Significant early childhood trauma.  Limited local therapeutic supports.  High dependence on daughter for emotional and practical support.  MENTAL STATUS EXAMINATION  Appearance: Elderly female, appropriately dressed, visible superficial wrist dressings.  Behavior: Calm, cooperative, tearful at times.  Speech: Normal rate and tone.  Mood: Depressed and overwhelmed.  Affect: Constricted, congruent with mood.  Thought Process: Linear but slowed.  Thought Content: Denies current SI/HI; endorses passive death-related distress and shame; no delusions.  Perception: No  hallucinations.  Cognition: Alert and oriented; subjectively reports cognitive slowing.  Insight: Fair.  Judgment: Impaired recently, as evidenced by self-harm. PHYSICAL EXAMINATION  General: Well-developed, well-nourished female, resting in bed, in no acute distress.  Cardiovascular: Regular rate and rhythm. Normal S1 and S2. No  murmurs, rubs, or gallops appreciated. Peripheral pulses palpable and symmetric.  Respiratory: Lungs clear to auscultation bilaterally. No wheezes, rales, or rhonchi. Non-labored respirations.  Neurologic: Alert and oriented to person, place, and time. Cranial nerves II-XII grossly intact. Motor strength 5/5 in all extremities. Sensation grossly intact to light touch. No focal neurologic deficits observed. No tremor noted at rest.  VITAL SIGNS  Vital signs within normal limits per ED documentation.  SUICIDE RISK ASSESSMENT  Recent self-harm: Yes (cutting)  Suicidal ideation: Present at time of incident; denies currently  Plan/intent: Denied currently  Past attempts: None  Risk factors: Advanced age, mood disorder, bipolar disorder, PTSD, recent medication changes, significant medical comorbidity, cognitive concerns, recent self-injury  Protective factors: Expressed desire for help, family involvement, outpatient psychiatric connection  Overall risk level: Moderate to High, given recent self-harm, mood instability, and ongoing depressive symptoms despite denial of active SI today  MEDICAL NECESSITY  Given the patients recent self-harm behavior, admission-level risk factors, mood dysregulation, cognitive concerns related to NPH and recent shunt changes, and inability to reliably maintain safety if discharged, inpatient psychiatric stabilization is medically necessary for safety, monitoring, medication management, and further diagnostic clarification.  ASSESSMENT / DIAGNOSES  Major Depressive Disorder, recurrent, severe  Bipolar Disorder (by  history)  Post-Traumatic Stress Disorder  Generalized Anxiety Disorder  Recent non-suicidal self-injury vs. suicidal behavior (context-dependent)  Psychosocial stressors and medical burden contributing to decompensation  PLAN  Safety  Recommend inpatient psychiatric admission for stabilization.  Suicide precautions and close monitoring.  Lacerations managed medically; no acute complications noted.  Medications  Continue current psychiatric medications with inpatient reassessment.  Monitor for adverse effects or activation given recent antidepressant changes.  Evaluate need for mood stabilizer optimization given bipolar history.  Medical Coordination  Review VP shunt status and recent neurosurgical adjustments.  Monitor for cognitive changes potentially related to NPH or medication effects.  Labs reviewed; no acute medical contraindications to psychiatric admission.  Psychotherapy  Initiate supportive therapy during hospitalization.  Recommend trauma-informed care and re-engagement with outpatient therapy post-discharge.  Disposition  Attempt transfer to [facility] inpatient psychiatry per family request, acknowledging potential delay.  If [facility] unavailable, recommend admission to [facility].  Social work actively involved; daughter updated regarding process.  Communication  Discussed findings and recommendations with ED team, TTS, and social work.  Family involved in care planning.

## 2024-02-10 NOTE — Group Note (Signed)
 Date:  02/10/2024 Time:  8:35 PM  Group Topic/Focus:  Wrap-Up Group:   The focus of this group is to help patients review their daily goal of treatment and discuss progress on daily workbooks.    Participation Level:  Active  Participation Quality:  Appropriate  Affect:  Appropriate  Cognitive:  Appropriate  Insight: Appropriate  Engagement in Group:  Engaged  Modes of Intervention:  Discussion  Additional Comments:    Karen [name] 02/10/2024, 8:35 PM

## 2024-02-11 MED ORDER — QUETIAPINE FUMARATE 25 MG PO TABS
25.0000 mg | ORAL_TABLET | Freq: Two times a day (BID) | ORAL | Status: DC
  Administered 2024-02-11 – 2024-02-21 (×19): 25 mg via ORAL
  Filled 2024-02-11 (×20): qty 1

## 2024-02-11 MED ORDER — THIAMINE MONONITRATE 100 MG PO TABS
100.0000 mg | ORAL_TABLET | Freq: Every day | ORAL | Status: DC
  Administered 2024-02-11 – 2024-02-21 (×11): 100 mg via ORAL
  Filled 2024-02-11 (×11): qty 1

## 2024-02-11 MED ORDER — MIRTAZAPINE 15 MG PO TABS
15.0000 mg | ORAL_TABLET | Freq: Every day | ORAL | Status: DC
  Administered 2024-02-11 – 2024-02-20 (×10): 15 mg via ORAL
  Filled 2024-02-11 (×10): qty 1

## 2024-02-11 NOTE — Telephone Encounter (Signed)
 Per daughter she would like to discuss next steps for patient following discharge from the Geriatric unit. Per daughter she would like a c/b to discuss this matter.   [phone]. Daughter states situation being urgent and would like a c/b asap.

## 2024-02-11 NOTE — Telephone Encounter (Signed)
 Conversation about next steps w/ team    Adm to [facility] geropsych 12/31.  Sarah planning on meeting with inpatient providers today to talk about dispo.  While Lauraine has visited, she found a note from her mother dated on day of suicide attempt with instructions on how to pay bills with several months worth of pre-written checks. [name] disclosed she does not feel safe living independently, and explicitly stated does not want to live with her daughters.   Leaning toward placement somewhere.   Lauraine interested in inpatient cognitive testing to see if dementia has progressed; may be a candidate for memory care.     Patients daughters to meet with care team today and requested I speak with her provider for continuity of care. Will plan to speak with provider after their meeting today.

## 2024-02-11 NOTE — Group Note (Signed)
 Physical/Occupational Therapy Group Note  Group Topic: Neurographic Art  Group Date: 02/11/2024 Start Time: 1305 End Time: 1355 Facilitators: Clive Warren CROME, OT   Group Description: Group participated with Neurographic art activity, using watercolor paints to facilitate creative expression and meditation/relaxation for each individual.  Incorporated bimanual coordination, mental focus, emotional processing, task/command following and relaxation techniques as appropriate.  Patients engaged socially with therapist and other group participants throughout session. Allowed to ask questions as appropriate, and encouraged to identify ways they could use/share their creations with themselves and others.  Therapeutic Goal(s):  Demonstrate ability to independently manipulate utensils required to participate with and complete activity. Demonstrate ability to cognitively focus on task and follow commands necessary for completion. Demonstrate use of art as an outlet for emotional processing and expression. Identify and demonstrate importance of relaxation, neural calming and meditation for improved participation with life groups.  Individual Participation: Pt pleasant, agreeable to participating. Pt required minimal instructions at the start of the session and able to complete activity with little to no VC. Pt engaged with prompting, otherwise remained fairly quiet. Did leave a few minutes early as she was done and endorsed fatigue and wish to lay down for a bit.   Participation Level: Moderate   Participation Quality: Independent   Behavior: Alert and Appropriate   Speech/Thought Process: Relevant   Affect/Mood: Flat   Insight: Moderate   Judgement: Moderate   Modes of Intervention: Activity, Clarification, Discussion, Education, Exploration, Problem-solving, Rapport Building, Socialization, and Support  Patient Response to Interventions:  Attentive and Engaged   Plan: Continue to engage patient  in PT/OT groups 1 - 2x/week.  Delno Blaisdell R., MPH, MS, OTR/L ascom [phone] 02/11/2024, 3:57 PM

## 2024-02-11 NOTE — Group Note (Signed)
 Date:  02/11/2024 Time:  12:16 PM  Group Topic/Focus:  Managing Feelings:   The focus of this group is to identify what feelings patients have difficulty handling and develop a plan to handle them in a healthier way upon discharge.    Participation Level:  Did Not Attend   Larrie Leita BRAVO 02/11/2024, 12:16 PM

## 2024-02-11 NOTE — Group Note (Signed)
 Date:  02/11/2024 Time:  8:35 PM  Group Topic/Focus:  Healthy Communication:   The focus of this group is to discuss communication, barriers to communication, as well as healthy ways to communicate with others.    Participation Level:  Active  Participation Quality:  Appropriate  Affect:  Appropriate  Cognitive:  Appropriate  Insight: Good  Engagement in Group:  Engaged  Modes of Intervention:  Discussion  Additional Comments:    Karen [name] 02/11/2024, 8:35 PM

## 2024-02-11 NOTE — Group Note (Signed)
 Date:  02/11/2024 Time:  3:41 PM  Group Topic/Focus:  Coping With Mental Health Crisis:   The purpose of this group is to help patients identify strategies for coping with mental health crisis.  Group discusses possible causes of crisis and ways to manage them effectively.    Participation Level:  Active  Participation Quality:  Appropriate  Affect:  Appropriate  Cognitive:  Appropriate  Insight: Appropriate  Engagement in Group:  Engaged  Modes of Intervention:  Education  Additional Comments:  none  Norleen SHAUNNA Bias 02/11/2024, 3:41 PM

## 2024-02-11 NOTE — Group Note (Signed)
 Recreation Therapy Group Note   Group Topic:Emotion Expression  Group Date: 02/11/2024 Start Time: 1400 End Time: 1430 Facilitators: Celestia Jeoffrey BRAVO, LRT, CTRS Location: Courtyard  Group Description: Music. Patients are encouraged to name their favorite song(s) for LRT to play song through speaker for group to hear, while in the courtyard getting fresh air and sunlight. Patients educated on the definition of leisure and the importance of having different leisure interests outside of the hospital. Group discussed how leisure activities can often be used as pharmacologist and that listening to music and being outside are examples.    Goal Area(s) Addressed:  Patient will identify a current leisure interest.  Patient will practice making a positive decision. Patient will have the opportunity to try a new leisure activity.   Affect/Mood: N/A   Participation Level: Did not attend    Clinical Observations/Individualized Feedback: Patient did not attend.  Plan: Continue to engage patient in RT group sessions 2-3x/week.   Jeoffrey BRAVO Celestia, LRT, CTRS 02/11/2024 4:58 PM

## 2024-02-11 NOTE — Progress Notes (Signed)
(  Sleep Hours) - 10  (Any PRNs that were needed, meds refused, or side effects to meds)- n/a  (Any disturbances and when (visitation, over night)- n/a  (Concerns raised by the patient)- n/a  (SI/HI/AVH)- denies

## 2024-02-11 NOTE — Progress Notes (Signed)
 NUTRITION ASSESSMENT  Pt identified as at risk on the Malnutrition Screen Tool  INTERVENTION:  -Liberalize diet to regular for widest variety of meal selections -MVI with minerals daily -Continue Ensure Plus High Protein po BID, each supplement provides 350 kcal and 20 grams of protein  -Continue MVI with minerals daily -100 mg thiamine daily   NUTRITION DIAGNOSIS: Unintentional weight loss related to sub-optimal intake as evidenced by pt report.   Goal: Pt to meet >/= 90% of their estimated nutrition needs.  Monitor:  PO intake  Assessment:  69 year old female with a significant psychiatric history including major depressive disorder, bipolar disorder, PTSD, and anxiety, as well as multiple complex medical comorbidities, who presents after an episode of self-inflicted cutting to the left wrist.   Patient admitted with MDD.  Per [facility] notes, patient with self inflicted hand wounds to left anterior wrist (full thickness).   Patient with multiple recent stressors, including recent changes to her medication regimen and concerns regarding VP shunt (replaced 2 weeks ago PTA).   Patient currently on a heart healthy diet. No meal completion data available to assess at this time. Ensure Plus High Protein BID added per protocol.  Reviewed weight history. Patient has experienced a 24.6% weight loss over the past 6 months, which is significant for time frame. Given significant weight loss and underweight status, highly suspect malnutrition, however, unable to identify at this time. Patient would benefit from addition of oral nutrition supplements. She is also at high refeeding risk. Due to suspect malnutrition and medical history, restricted diet is not warranted at this time.   Medications reviewed and include remeron and MVI.   Labs reviewed.    69 y.o. female  Height: Ht Readings from Last 1 Encounters:  02/09/24 5' 5 (1.651 m)    Weight: Wt Readings from Last 1 Encounters:   02/09/24 43.8 kg    Weight Hx: Wt Readings from Last 10 Encounters:  02/09/24 43.8 kg  02/08/24 49.9 kg  11/16/23 49 kg  09/24/17 57.8 kg  07/16/17 58.1 kg  03/19/17 59 kg  12/18/16 58.1 kg  09/18/16 57.9 kg  06/18/16 56.9 kg  05/21/16 56.8 kg    BMI:  Body mass index is 16.06 kg/m. Pt meets criteria for underweight based on current BMI.  Estimated Nutritional Needs: Kcal: 25-30 kcal/kg Protein: > 1 gram protein/kg Fluid: 1 ml/kcal  Diet Order:  Diet Order             Diet Heart Room service appropriate? Yes; Fluid consistency: Thin  Diet effective now                  Pt is also offered choice of unit snacks mid-morning and mid-afternoon.  Pt is eating as desired.   Lab results and medications reviewed.   Margery ORN, RD, LDN, CDCES Registered Dietitian III Certified Diabetes Care and Education Specialist If unable to reach this RD, please use RD Inpatient group chat on secure chat between hours of 8am-4 pm daily

## 2024-02-11 NOTE — Progress Notes (Signed)
 Wound care performed to L wrist as per wound care order.
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
## 2024-02-12 NOTE — [facility] Counselor (Signed)
 Family Communication Request: Patient's daughter has requested a phone call from the physician to discuss the patient's progress and nest steps. Contact information: [name] Vaughn - [phone] Sarah Waycott - [phone] Consent: Patient has provided consent for the physician to speak with both daughters.  Note: [name] Vaughn will be traveling for the reminder of the day.  If the call is to occur today, please contact Sarah Waycott instead.

## 2024-02-12 NOTE — Plan of Care (Signed)
  Problem: Safety: Goal: Periods of time without injury will increase Outcome: Progressing   Problem: Education: Goal: Ability to state activities that reduce stress will improve Outcome: Progressing   

## 2024-02-12 NOTE — Group Note (Signed)
 Date:  02/12/2024 Time:  10:36 AM  Group Topic/Focus:  Coping With Mental Health Crisis:   The purpose of this group is to help patients identify strategies for coping with mental health crisis.  Group discusses possible causes of crisis and ways to manage them effectively.   Active Participation Level:    Participation Quality:  Appropriate  Affect:  Appropriate  Cognitive:  Appropriate  Insight: Appropriate  Engagement in Group:  Engaged  Modes of Intervention:  Education  Additional Comments:  none  Norleen SHAUNNA Bias 02/12/2024, 10:36 AM

## 2024-02-12 NOTE — Group Note (Signed)
 LCSW Group Therapy Note  Group Date: 02/12/2024 Start Time: 1032 End Time: 1110   Type of Therapy and Topic:  Group Therapy - Healthy vs Unhealthy Coping Skills  Participation Level:  Did Not Attend   Description of Group The focus of this group was to determine what unhealthy coping techniques typically are used by group members and what healthy coping techniques would be helpful in coping with various problems. Patients were guided in becoming aware of the differences between healthy and unhealthy coping techniques. Patients were asked to identify 2-3 healthy coping skills they would like to learn to use more effectively.  Therapeutic Goals Patients learned that coping is what human beings do all day long to deal with various situations in their lives Patients defined and discussed healthy vs unhealthy coping techniques Patients identified their preferred coping techniques and identified whether these were healthy or unhealthy Patients determined 2-3 healthy coping skills they would like to become more familiar with and use more often. Patients provided support and ideas to each other   Summary of Patient Progress: Patient did not attend group.   Therapeutic Modalities Cognitive Behavioral Therapy Motivational Interviewing  Shacoria Latif W Florice Hindle, LCSWA 02/12/2024  12:17 PM

## 2024-02-12 NOTE — Progress Notes (Signed)
Wound care performed as per order

## 2024-02-12 NOTE — [facility] Suicide Risk Assessment (Signed)
 [facility] INPATIENT:  Family/Significant Other Suicide Prevention Education  Suicide Prevention Education:  Education Completed; Karen [name], daughter, [phone] has been identified by the patient as the family member/significant other with whom the patient will be residing, and identified as the person(s) who will aid the patient in the event of a mental health crisis (suicidal ideations/suicide attempt).  With written consent from the patient, the family member/significant other has been provided the following suicide prevention education, prior to the and/or following the discharge of the patient.  The suicide prevention education provided includes the following: Suicide risk factors Suicide prevention and interventions National Suicide Hotline telephone number [facility] assessment telephone number [facility] Emergency Assistance 911 [facility] and/or Residential Mobile Crisis Unit telephone number  Request made of family/significant other to: Remove weapons (e.g., guns, rifles, knives), all items previously/currently identified as safety concern.   Remove drugs/medications (over-the-counter, prescriptions, illicit drugs), all items previously/currently identified as a safety concern.  The family member/significant other verbalizes understanding of the suicide prevention education information provided.  The family member/significant other agrees to remove the items of safety concern listed above.  Rexene LELON Mae 02/12/2024, 4:48 PM

## 2024-02-12 NOTE — Plan of Care (Signed)
 Patient alert and oriented x 4. Denies SI, HI, AVH and pain. Scheduled medications per MAR. Support and encouragement provided.  Routine safety checks conducted every 15 minutes.  Patient informed to notify staff with problems or concerns. No adverse drug reactions noted. Patient verbally contracts for safety at this time. Patient interacts well with others on the unit.  Patient remains safe at this time.   Problem: Education: Goal: Emotional status will improve 02/12/2024 0256 by Birdena Glee DASEN, RN Outcome: Progressing 02/12/2024 0256 by Kelly-Savage, Adjoa Althouse T, RN Outcome: Progressing   Problem: Education: Goal: Mental status will improve 02/12/2024 0256 by Kelly-Savage, Khali Albanese T, RN Outcome: Progressing 02/12/2024 0256 by Kelly-Savage, Carely Nappier T, RN Outcome: Progressing   Problem: Education: Goal: Mental status will improve 02/12/2024 0256 by Kelly-Savage, Darlys Buis T, RN Outcome: Progressing   Problem: Health Behavior/Discharge Planning: Goal: Identification of resources available to assist in meeting health care needs will improve 02/12/2024 0256 by Birdena Glee DASEN, RN Outcome: Progressing 02/12/2024 0256 by Kelly-Savage, Sanskriti Greenlaw T, RN Outcome: Progressing Goal: Compliance with treatment plan for underlying cause of condition will improve 02/12/2024 0256 by Birdena Glee DASEN, RN Outcome: Progressing 02/12/2024 0256 by Kelly-Savage, [place] Whedbee T, RN Outcome: Progressing   Problem: Safety: Goal: Periods of time without injury will increase 02/12/2024 0256 by Birdena Glee DASEN, RN Outcome: Progressing 02/12/2024 0256 by Birdena Glee DASEN, RN Outcome: Progressing

## 2024-02-12 NOTE — Progress Notes (Signed)
(  Sleep Hours) - 8.50 (Any PRNs that were needed, meds refused, or side effects to meds)- None requested/required (Any disturbances and when (visitation, over night)- None reported/observed (Concerns raised by the patient)- None reported/observed (SI/HI/AVH)- Denies

## 2024-02-12 NOTE — Progress Notes (Signed)
 SI/HI/AVH: passive with no plan/denies HI and AVH  Behavior/Mood:  cooperative/anxious   Interaction/Group attendance: cautious/ 0 of 3 groups   Medication/PRNs: compliant/ none   Pain: denies  Other: wound care orders for L wrist

## 2024-02-12 NOTE — Care Plan (Signed)
 Pt was assisted in shower to wash hair and body. Staff wrapped arm in plastic belonging bag with tape around the top. Pt was very concerned about getting wound area wet. However she held her arm up in the arm with the bag and staff completed much of the shower ADL.  PT was helped with drying off and cleaning up room. PT dressed on her own.

## 2024-02-12 NOTE — Progress Notes (Signed)
" °   02/12/24 1400  Psych Admission Type (Psych Patients Only)  Admission Status Voluntary  Psychosocial Assessment  Patient Complaints Anxiety  Eye Contact Brief  Facial Expression Sad  Affect Sad  Speech Soft  Interaction Cautious  Motor Activity Slow  Appearance/Hygiene In scrubs  Behavior Characteristics Cooperative  Mood Anxious  Thought Process  Coherency WDL  Content WDL  Delusions None reported or observed  Perception WDL  Hallucination None reported or observed  Judgment Impaired  Confusion None  Danger to Self  Current suicidal ideation? Passive  Agreement Not to Harm Self Yes  Description of Agreement verbal  Danger to Others  Danger to Others None reported or observed    "

## 2024-02-13 DIAGNOSIS — F332 Major depressive disorder, recurrent severe without psychotic features: Secondary | ICD-10-CM | POA: Diagnosis not present

## 2024-02-13 DIAGNOSIS — F411 Generalized anxiety disorder: Secondary | ICD-10-CM | POA: Diagnosis not present

## 2024-02-13 DIAGNOSIS — R4588 Nonsuicidal self-harm: Secondary | ICD-10-CM | POA: Diagnosis not present

## 2024-02-13 DIAGNOSIS — F4310 Post-traumatic stress disorder, unspecified: Secondary | ICD-10-CM | POA: Diagnosis not present

## 2024-02-13 NOTE — Group Note (Signed)
 Date:  02/13/2024 Time:  2:17 PM  Group Topic/Focus:  Goals Group:   The focus of this group is to help patients establish daily goals to achieve during treatment and discuss how the patient can incorporate goal setting into their daily lives to aide in recovery.    Participation Level:  Did Not Attend   Karen [name] 02/13/2024, 2:17 PM

## 2024-02-13 NOTE — Progress Notes (Signed)
 Pt reports passive SI/SIB. When discussed, pt states she thinks about her 8 grandchildren which is motivation to be here. Further states looking at her left wrist is reminder to avoid future self-harm. Pt currently denies plan or intent. Contract for safety. Behavior/Mood: Calm and cooperative. Affect flat and sad. Endorses anxiety related to left wrist. Reports sleeping well and states medications is helping.  Interaction/Group: Min interaction with peers and staff. Attends group.  Medications/PRNs: po med compliant. No PRNs given Pain: denies Other: wound care to left FA provided. Wound edges are well approximated. No redness, swelling and non-tender to touch. Site cleansed, pat dry and covered w/ aquacell, and covered foam dsg. No s/s of infection noted. Slept 11.75 hours   02/12/24 2100  Psych Admission Type (Psych Patients Only)  Admission Status Voluntary  Psychosocial Assessment  Patient Complaints Anxiety  Eye Contact Brief  Facial Expression Flat  Affect Anxious  Speech Soft  Interaction Cautious  Motor Activity Slow  Appearance/Hygiene In scrubs  Behavior Characteristics Cooperative  Mood Anxious  Thought Process  Coherency WDL  Content WDL  Delusions None reported or observed  Perception WDL  Hallucination None reported or observed  Judgment Impaired  Confusion None  Danger to Self  Current suicidal ideation? Passive  Self-Injurious Behavior No self-injurious ideation or behavior indicators observed or expressed   Agreement Not to Harm Self Yes  Description of Agreement verbal  Danger to Others  Danger to Others None reported or observed

## 2024-02-13 NOTE — Group Note (Signed)
 [facility] LCSW Group Therapy Note   Group Date: 02/13/2024 Start Time: 1030 End Time: 1115   Type of Therapy/Topic:  Group Therapy:  Emotion Regulation  Participation Level:  Did Not Attend   Mood: Not able to assess.  Pt did not attend group.  Description of Group:    The purpose of this group is to assist patients in learning to regulate negative emotions and experience positive emotions. Patients will be guided to discuss ways in which they have been vulnerable to their negative emotions. These vulnerabilities will be juxtaposed with experiences of positive emotions or situations, and patients challenged to use positive emotions to combat negative ones. Special emphasis will be placed on coping with negative emotions in conflict situations, and patients will process healthy conflict resolution skills.  Therapeutic Goals: Patient will identify two positive emotions or experiences to reflect on in order to balance out negative emotions:  Patient will label two or more emotions that they find the most difficult to experience:  Patient will be able to demonstrate positive conflict resolution skills through discussion or role plays:   Summary of Patient Progress:  Pt did not attend group   Therapeutic Modalities:   Cognitive Behavioral Therapy Feelings Identification Dialectical Behavioral Therapy   Rexene LELON Mae, LCSWA

## 2024-02-13 NOTE — Group Note (Signed)
 Date:  02/13/2024 Time:  9:02 PM  Group Topic/Focus:  Wrap-Up Group:   The focus of this group is to help patients review their daily goal of treatment and discuss progress on daily workbooks.    Participation Level:  Did Not Attend  Participation Quality:     Affect:     Cognitive:     Insight: None  Engagement in Group:    Modes of Intervention:     Additional Comments:    Tommas [name] Bunker 02/13/2024, 9:02 PM

## 2024-02-13 NOTE — Plan of Care (Signed)
  Problem: Activity: Goal: Interest or engagement in activities will improve Outcome: Progressing   Problem: Health Behavior/Discharge Planning: Goal: Compliance with treatment plan for underlying cause of condition will improve Outcome: Progressing   

## 2024-02-13 NOTE — Progress Notes (Signed)
 SABRA

## 2024-02-13 NOTE — Plan of Care (Signed)
" °  Problem: Coping: Goal: Ability to verbalize frustrations and anger appropriately will improve Outcome: Progressing Goal: Ability to demonstrate self-control will improve Outcome: Progressing   Problem: Safety: Goal: Periods of time without injury will increase Outcome: Progressing   Problem: Education: Goal: Ability to state activities that reduce stress will improve Outcome: Progressing   "

## 2024-02-13 NOTE — Progress Notes (Signed)
 [facility] MD Progress Note  02/14/2024 9:45 AM Karen [name]  MRN:  [id number]   Subjective:  Chart reviewed, case discussed in multidisciplinary meeting, patient seen during rounds.     Past Psychiatric History: see h&P Family History:  Family History  Problem Relation Age of Onset   Breast cancer Neg Hx    Social History:  Social History   Substance and Sexual Activity  Alcohol Use Not Currently   Comment: occ. wine maybe 1  month     Social History   Substance and Sexual Activity  Drug Use No    Social History   Socioeconomic History   Marital status: Divorced    Spouse name: Not on file   Number of children: Not on file   Years of education: Not on file   Highest education level: Not on file  Occupational History   Not on file  Tobacco Use   Smoking status: Former    Current packs/day: 0.00    Average packs/day: 0.5 packs/day for 48.0 years (24.0 ttl pk-yrs)    Types: Cigarettes    Start date: 02/09/1962    Quit date: 02/09/2010    Years since quitting: 14.0   Smokeless tobacco: Never  Vaping Use   Vaping status: Never Used  Substance and Sexual Activity   Alcohol use: Not Currently    Comment: occ. wine maybe 1  month   Drug use: No   Sexual activity: Not on file  Other Topics Concern   Not on file  Social History Narrative   Not on file   Social Drivers of Health   Tobacco Use: Medium Risk (02/09/2024)   Patient History    Smoking Tobacco Use: Former    Smokeless Tobacco Use: Never    Passive Exposure: Not on file  Financial Resource Strain: Patient Declined (10/15/2023)   Received from [facility]   Overall Financial Resource Strain (CARDIA)    How hard is it for you to pay for the very basics like food, housing, medical care, and heating?: Patient declined  Recent Concern: Financial Resource Strain - Medium Risk (09/23/2023)   Received from [facility]   Overall Financial Resource Strain (CARDIA)    Difficulty of Paying  Living Expenses: Somewhat hard  Food Insecurity: No Food Insecurity (02/09/2024)   Epic    Worried About Running Out of Food in the Last Year: Never true    Ran Out of Food in the Last Year: Never true  Transportation Needs: No Transportation Needs (02/09/2024)   Epic    Lack of Transportation (Medical): No    Lack of Transportation (Non-Medical): No  Physical Activity: Not on file  Stress: Not on file  Social Connections: Socially Isolated (02/09/2024)   Social Connection and Isolation Panel    Frequency of Communication with Friends and Family: Three times a week    Frequency of Social Gatherings with Friends and Family: Three times a week    Attends Religious Services: Never    Active Member of Clubs or Organizations: No    Attends Banker Meetings: Never    Marital Status: Divorced  Depression (PHQ2-9): Not on file  Alcohol Screen: Low Risk (02/09/2024)   Alcohol Screen    Last Alcohol Screening Score (AUDIT): 0  Housing: Low Risk (02/09/2024)   Epic    Unable to Pay for Housing in the Last Year: No    Number of Times Moved in the Last Year: 0    Homeless  in the Last Year: No  Utilities: Not At Risk (02/09/2024)   Epic    Threatened with loss of utilities: No  Health Literacy: Not on file   Past Medical History:  Past Medical History:  Diagnosis Date   Anemia    Anxiety    Arthritis    Breast cancer (HCC) 1995   left breast cancer - chemo and modified mastectomy   Depression    Dyspnea    Dysrhythmia    Essential thrombocytosis (HCC)    GERD (gastroesophageal reflux disease)    Heart murmur    History of hiatal hernia    Hyperlipidemia    Hyperthyroidism    Mitral valve prolapse 1997   Normal pressure hydrocephalus (HCC) 2022   shunted   Osteoporosis    Personal history of chemotherapy 1995   BREAST CA   Personal history of radiation therapy 1995   BREAST CA   Sjogren's syndrome    Vitamin D deficiency     Past Surgical History:   Procedure Laterality Date   AUGMENTATION MAMMAPLASTY Bilateral 1995   BREAST LUMPECTOMY Left 1995   COLONOSCOPY N/A 11/16/2023   Procedure: COLONOSCOPY;  Surgeon: Maryruth Ole DASEN, MD;  Location: [facility] ENDOSCOPY;  Service: Endoscopy;  Laterality: N/A;   COLONOSCOPY WITH PROPOFOL  N/A 07/16/2017   Procedure: COLONOSCOPY WITH PROPOFOL ;  Surgeon: Viktoria Lamar DASEN, MD;  Location: [facility] ENDOSCOPY;  Service: Endoscopy;  Laterality: N/A;   ESOPHAGOGASTRODUODENOSCOPY N/A 11/16/2023   Procedure: EGD (ESOPHAGOGASTRODUODENOSCOPY);  Surgeon: Maryruth Ole DASEN, MD;  Location: [facility] ENDOSCOPY;  Service: Endoscopy;  Laterality: N/A;   MASTECTOMY Left 1995   Tram flap W/IMPLANT   TONSILLECTOMY      Current Medications: Current Facility-Administered Medications  Medication Dose Route Frequency Provider Last Rate Last Admin   acetaminophen  (TYLENOL ) tablet 650 mg  650 mg Oral Q6H PRN Smith, Annie B, NP   650 mg at 02/10/24 1833   alum & mag hydroxide-simeth (MAALOX/MYLANTA) 200-200-20 MG/5ML suspension 30 mL  30 mL Oral Q4H PRN Smith, Annie B, NP       atorvastatin  (LIPITOR ) tablet 80 mg  80 mg Oral Daily Smith, Annie B, NP   80 mg at 02/14/24 0931   feeding supplement (ENSURE PLUS HIGH PROTEIN) liquid 237 mL  237 mL Oral BID BM Ziyah Cordoba R, MD   237 mL at 02/13/24 1448   LORazepam  (ATIVAN ) tablet 1 mg  1 mg Oral Q6H PRN [place] R, MD   1 mg at 02/10/24 1230   magnesium  hydroxide (MILK OF MAGNESIA) suspension 30 mL  30 mL Oral Daily PRN Smith, Annie B, NP       mirtazapine  (REMERON ) tablet 15 mg  15 mg Oral QHS Jessenya Berdan R, MD   15 mg at 02/13/24 2140   multivitamin with minerals tablet 1 tablet  1 tablet Oral Daily Smith, Annie B, NP   1 tablet at 02/14/24 9068   OLANZapine  (ZYPREXA ) injection 5 mg  5 mg Intramuscular TID PRN Smith, Annie B, NP       OLANZapine  zydis (ZYPREXA ) disintegrating tablet 5 mg  5 mg Oral TID PRN Smith, Annie B, NP       QUEtiapine  (SEROQUEL ) tablet 25 mg  25  mg Oral BID Nykayla Marcelli R, MD   25 mg at 02/14/24 0931   raloxifene  (EVISTA ) tablet 60 mg  60 mg Oral Daily Smith, Annie B, NP   60 mg at 02/14/24 9062   thiamine  (VITAMIN B1) tablet 100 mg  100 mg Oral Daily Gerrell Tabet R, MD   100 mg at 02/14/24 0931   traZODone  (DESYREL ) tablet 150 mg  150 mg Oral QHS Smith, Annie B, NP   150 mg at 02/13/24 2140    Lab Results: No results found for this or any previous visit (from the past 48 hours).  Blood Alcohol level:  Lab Results  Component Value Date   [facility] <15 02/08/2024    Metabolic Disorder Labs: Lab Results  Component Value Date   HGBA1C 5.6 02/10/2024   MPG 114.02 02/10/2024   No results found for: PROLACTIN Lab Results  Component Value Date   CHOL 143 02/10/2024   TRIG 74 02/10/2024   HDL 59 02/10/2024   CHOLHDL 2.4 02/10/2024   VLDL 15 02/10/2024   LDLCALC 69 02/10/2024    Physical Findings: AIMS:  , ,  ,  ,    CIWA:    COWS:      Psychiatric Specialty Exam:  Presentation  Patient is a elderly female who appears stated age alert oriented x 2 she mood is sad affect is anxious thought processes logical and coherent passive suicidal thoughts at this time denies any homicidal thoughts no perceptual disturbances Insight and judgment at baseline Musculoskeletal: Strength & Muscle Tone: within normal limits Gait & Station: normal Assets  Assets:No data recorded   Physical Exam: Physical Exam ROS Blood pressure 126/61, pulse 93, temperature 98.1 F (36.7 C), resp. rate 15, height 5' 5 (1.651 m), weight 43.8 kg, SpO2 100%. Body mass index is 16.06 kg/m.  Diagnosis: Principal Problem:   MDD (major depressive disorder)   PLAN: Safety and Monitoring:  -- Voluntary admission to inpatient psychiatric unit for safety, stabilization and treatment  -- Daily contact with patient to assess and evaluate symptoms and progress in treatment  -- Patient's case to be discussed in multi-disciplinary team meeting  --  Observation Level : q15 minute checks  -- Vital signs:  q12 hours  -- Precautions: suicide, elopement, and assault -- Encouraged patient to participate in unit milieu and in scheduled group therapies  2. Psychiatric Treatment:  Scheduled Medications: Patient will be continued on Remeron  which will be increased to 15 mg nightly Seroquel  be increased also     -- The risks/benefits/side-effects/alternatives to this medication were discussed in detail with the patient and time was given for questions. The patient consents to medication trial.  3. Medical Issues Being Addressed:     4. Discharge Planning:   -- Social work and case management to assist with discharge planning and identification of hospital follow-up needs prior to discharge  -- Estimated LOS: 3-4 days  Millie JONELLE Manners, MD 02/14/2024, 9:45 AM

## 2024-02-13 NOTE — Group Note (Signed)
 Date:  02/13/2024 Time:  3:54 PM  Group Topic/Focus:  Wellness Toolbox:   The focus of this group is to discuss various aspects of wellness, balancing those aspects and exploring ways to increase the ability to experience wellness.  Patients will create a wellness toolbox for use upon discharge.    Participation Level:  Minimal  Participation Quality:  Appropriate, Attentive, and Drowsy  Affect:  Appropriate  Cognitive:  Alert  Insight: Good  Engagement in Group:  Limited  Modes of Intervention:  Discussion  Additional Comments:     Maglione,Laurell Coalson E 02/13/2024, 3:54 PM

## 2024-02-13 NOTE — Progress Notes (Signed)
 Behavior:  Pleasant and cooperative.    Psych assessment:  Endorses anxiety and depression.  Passive SI, but contracts for safety.  Denies HI/AVH.    Interaction / Group attendance:  Minimal interaction.  Attended 1 group.  Medication/ PRNs: Compliant.  Pain: Denies.  15 min checks in place for safety.    Wound cleaned and dressed per wound care order.

## 2024-02-14 DIAGNOSIS — G912 (Idiopathic) normal pressure hydrocephalus: Principal | ICD-10-CM

## 2024-02-14 DIAGNOSIS — F329 Major depressive disorder, single episode, unspecified: Secondary | ICD-10-CM

## 2024-02-14 DIAGNOSIS — F332 Major depressive disorder, recurrent severe without psychotic features: Secondary | ICD-10-CM | POA: Diagnosis not present

## 2024-02-14 MED ORDER — COLLAGENASE 250 UNIT/GM EX OINT
TOPICAL_OINTMENT | Freq: Every day | CUTANEOUS | Status: DC
  Administered 2024-02-19 – 2024-02-20 (×2): 1 via TOPICAL
  Filled 2024-02-14 (×2): qty 30

## 2024-02-14 NOTE — Group Note (Signed)
 Date:  02/14/2024 Time:  9:04 PM  Group Topic/Focus:  Wrap-Up Group:   The focus of this group is to help patients review their daily goal of treatment and discuss progress on daily workbooks.    Participation Level:  Did Not Attend  Participation Quality:     Affect:     Cognitive:     Insight: None  Engagement in Group:  None  Modes of Intervention:     Additional Comments:    Karen [name] 02/14/2024, 9:04 PM

## 2024-02-14 NOTE — Plan of Care (Signed)
   Problem: Coping: Goal: Ability to verbalize frustrations and anger appropriately will improve Outcome: Progressing

## 2024-02-14 NOTE — Plan of Care (Signed)
   Problem: Education: Goal: Emotional status will improve Outcome: Progressing Goal: Mental status will improve Outcome: Progressing

## 2024-02-14 NOTE — BH IP Treatment Plan (Signed)
 Interdisciplinary Treatment and Diagnostic Plan Update  02/14/2024 Time of Session: 11:26 AM  Karen [name] MRN: [id number]  Principal Diagnosis: MDD (major depressive disorder)  Secondary Diagnoses: Principal Problem:   MDD (major depressive disorder)   Current Medications:  Current Facility-Administered Medications  Medication Dose Route Frequency Provider Last Rate Last Admin   acetaminophen  (TYLENOL ) tablet 650 mg  650 mg Oral Q6H PRN Smith, Annie B, NP   650 mg at 02/10/24 1833   alum & mag hydroxide-simeth (MAALOX/MYLANTA) 200-200-20 MG/5ML suspension 30 mL  30 mL Oral Q4H PRN Smith, Annie B, NP       atorvastatin  (LIPITOR ) tablet 80 mg  80 mg Oral Daily Smith, Annie B, NP   80 mg at 02/14/24 0931   feeding supplement (ENSURE PLUS HIGH PROTEIN) liquid 237 mL  237 mL Oral BID BM Madaram, Kondal R, MD   237 mL at 02/14/24 1026   LORazepam  (ATIVAN ) tablet 1 mg  1 mg Oral Q6H PRN Madaram, Kondal R, MD   1 mg at 02/10/24 1230   magnesium  hydroxide (MILK OF MAGNESIA) suspension 30 mL  30 mL Oral Daily PRN Smith, Annie B, NP       mirtazapine  (REMERON ) tablet 15 mg  15 mg Oral QHS Madaram, Kondal R, MD   15 mg at 02/13/24 2140   multivitamin with minerals tablet 1 tablet  1 tablet Oral Daily Smith, Annie B, NP   1 tablet at 02/14/24 9068   OLANZapine  (ZYPREXA ) injection 5 mg  5 mg Intramuscular TID PRN Smith, Annie B, NP       OLANZapine  zydis (ZYPREXA ) disintegrating tablet 5 mg  5 mg Oral TID PRN Smith, Annie B, NP       QUEtiapine  (SEROQUEL ) tablet 25 mg  25 mg Oral BID Madaram, Kondal R, MD   25 mg at 02/14/24 0931   raloxifene  (EVISTA ) tablet 60 mg  60 mg Oral Daily Smith, Annie B, NP   60 mg at 02/14/24 9062   thiamine  (VITAMIN B1) tablet 100 mg  100 mg Oral Daily Madaram, Kondal R, MD   100 mg at 02/14/24 0931   traZODone  (DESYREL ) tablet 150 mg  150 mg Oral QHS Smith, Annie B, NP   150 mg at 02/13/24 2140   PTA Medications: Medications Prior to Admission  Medication Sig  Dispense Refill Last Dose/Taking   aspirin EC 81 MG tablet Take 81 mg by mouth daily. (Patient not taking: Reported on 02/08/2024)      atorvastatin  (LIPITOR ) 80 MG tablet Take 80 mg by mouth daily.      docusate sodium (COLACE) 100 MG capsule Take 100 mg by mouth daily. (Patient not taking: Reported on 02/08/2024)      mirtazapine  (REMERON ) 7.5 MG tablet Take 7.5 mg by mouth at bedtime.      Multiple Vitamin (MULTIVITAMIN) tablet Take 1 tablet by mouth daily.      NON FORMULARY Take 1 mL by mouth daily.      omega-3 acid ethyl esters (LOVAZA) 1 g capsule Take 1 g by mouth daily. (Patient not taking: Reported on 02/08/2024)      omeprazole (PRILOSEC) 20 MG capsule Take 20 mg by mouth daily.      Probiotic Product (PROBIOTIC-10) CHEW Chew by mouth. (Patient not taking: Reported on 02/08/2024)      psyllium (METAMUCIL) 58.6 % packet Take 1 packet by mouth daily as needed.  (Patient not taking: Reported on 02/08/2024)      QUEtiapine  (SEROQUEL ) 25 MG tablet  Take 12.5 mg by mouth 2 (two) times daily as needed (anxiety/panic symptoms).      raloxifene  (EVISTA ) 60 MG tablet Take 60 mg by mouth daily.      traZODone  (DESYREL ) 150 MG tablet Take 150 mg by mouth at bedtime.       Patient Stressors:    Patient Strengths:    Treatment Modalities: Medication Management, Group therapy, Case management,  1 to 1 session with clinician, Psychoeducation, Recreational therapy.   Physician Treatment Plan for Primary Diagnosis: MDD (major depressive disorder) Long Term Goal(s):     Short Term Goals:    Medication Management: Evaluate patient's response, side effects, and tolerance of medication regimen.  Therapeutic Interventions: 1 to 1 sessions, Unit Group sessions and Medication administration.  Evaluation of Outcomes: Progressing  Physician Treatment Plan for Secondary Diagnosis: Principal Problem:   MDD (major depressive disorder)  Long Term Goal(s):     Short Term Goals:       Medication  Management: Evaluate patient's response, side effects, and tolerance of medication regimen.  Therapeutic Interventions: 1 to 1 sessions, Unit Group sessions and Medication administration.  Evaluation of Outcomes: Progressing   RN Treatment Plan for Primary Diagnosis: MDD (major depressive disorder) Long Term Goal(s): Knowledge of disease and therapeutic regimen to maintain health will improve  Short Term Goals: Ability to remain free from injury will improve, Ability to verbalize frustration and anger appropriately will improve, Ability to demonstrate self-control, Ability to participate in decision making will improve, Ability to verbalize feelings will improve, Ability to disclose and discuss suicidal ideas, Ability to identify and develop effective coping behaviors will improve, and Compliance with prescribed medications will improve  Medication Management: RN will administer medications as ordered by provider, will assess and evaluate patient's response and provide education to patient for prescribed medication. RN will report any adverse and/or side effects to prescribing provider.  Therapeutic Interventions: 1 on 1 counseling sessions, Psychoeducation, Medication administration, Evaluate responses to treatment, Monitor vital signs and CBGs as ordered, Perform/monitor CIWA, COWS, AIMS and Fall Risk screenings as ordered, Perform wound care treatments as ordered.  Evaluation of Outcomes: Progressing   LCSW Treatment Plan for Primary Diagnosis: MDD (major depressive disorder) Long Term Goal(s): Safe transition to appropriate next level of care at discharge, Engage patient in therapeutic group addressing interpersonal concerns.  Short Term Goals: Engage patient in aftercare planning with referrals and resources, Increase social support, Increase ability to appropriately verbalize feelings, Increase emotional regulation, Facilitate acceptance of mental health diagnosis and concerns, Facilitate  patient progression through stages of change regarding substance use diagnoses and concerns, Identify triggers associated with mental health/substance abuse issues, and Increase skills for wellness and recovery  Therapeutic Interventions: Assess for all discharge needs, 1 to 1 time with Social worker, Explore available resources and support systems, Assess for adequacy in community support network, Educate family and significant other(s) on suicide prevention, Complete Psychosocial Assessment, Interpersonal group therapy.  Evaluation of Outcomes: Progressing   Progress in Treatment: Attending groups: Yes. and No. Participating in groups: Yes. and No. Taking medication as prescribed: Yes. Toleration medication: Yes. Family/Significant other contact made: Yes, individual(s) contacted:  [name] (daughter) [phone] and Meghan Vaughtn (daughter) [phone] Patient understands diagnosis: Yes. Discussing patient identified problems/goals with staff: Yes. Medical problems stabilized or resolved: Yes. Denies suicidal/homicidal ideation: Yes. Issues/concerns per patient self-inventory: No. Other: None   New problem(s) identified: No, Describe:  None identified   New Short Term/Long Term Goal(s):  elimination of symptoms of psychosis,  medication management for mood stabilization; elimination of SI thoughts; development of comprehensive mental wellness plan.   Patient Goals:   I need to work on getting better, I was feeling depression and anxiety  Discharge Plan or Barriers: CSW will assist with appropriate discharge planning   Reason for Continuation of Hospitalization: Anxiety Depression Medication stabilization  Estimated Length of Stay: 1 to 7 days   Last 3 [place] Suicide Severity Risk Score: Flowsheet Row Admission (Current) from 02/09/2024 in [facility] ED from 02/08/2024 in [facility] Department [facility] Admission  (Discharged) from 11/16/2023 in [facility]  C-SSRS RISK CATEGORY High Risk High Risk No Risk    Last PHQ 2/9 Scores:     No data to display          Scribe for Treatment Team: [name], LCSWA 02/14/2024 2:10 PM

## 2024-02-14 NOTE — Consult Note (Signed)
 WOC Nurse Consult Note: Reason for Consult: Requested to reassess wound on L wrist. Wound type: self warm, full thickness. Pressure Injury POA: NA Measurement: 3 cuts in line, top one nearest the hand wound opened with 100% yellow slough. Second and third closed by dermabond, no drainage. Wound bed: first self harm 100% yellow. Drainage (amount, consistency, odor) scant amount, no odor, serous. Periwound: intact. Dressing procedure/placement/frequency: Cleanse with saline, pat dry. Apply Santyl  daily to the wound bed on the top cut line, using a Q tip applicator. Do not apply any ointment to the other cuts, leaving dry. Cover the entire area with foam dressing. The foam can remain 3 days if not saturated or soiled, ok to lift and reapply.    WOC team will not plan to follow further. Please reconsult if further assistance is needed. Thank-you,  Lela Holm MSN, RN, CWCN, CNS.  (Phone [phone])

## 2024-02-14 NOTE — Group Note (Signed)
 Physical/Occupational Therapy Group Note  Group Topic: Functional, Dynamic Balance   Group Date: 02/14/2024 Start Time: 1300 End Time: 1330 Facilitators: Joyice Magda, Alm Hamilton, PT   Group Description: Group discussed impact of balance on safety and independence with functional tasks.  Identified and discussed any self-perceived balance deficits to personalize information.  Discussed and reviewed strategies to address/improve balance deficits: use of assist devices, activity pacing/energy conservation, environment/home safety modifications, focusing attention/minimizing distraction.  Reviewed and participated with standing LE therex designed to target dynamic balance reactions and LE strength/stability; provided handouts with HEP to be utilized outside of group time as appropriate.  Allowed time for questions and further discussion on any balance or mobility concerns/needs.  Therapeutic Goal(s):  Identify and discuss any individual balance deficits and functional implications. Identify and discuss any environmental/home safety modifications that can optimize balance and safety for mobility within the home. Demonstrate understanding and performance of standing therex designed to target dynamic balance deficits.  Individual Participation: Pt was quietly observant during the session with occasional participation during the activity portion of the session.  Participation Level: Minimal   Participation Quality: Minimal Cues   Behavior: Calm and Isolative   Speech/Thought Process: Did not participate in discussion    Affect/Mood: Flat   Insight: Moderate   Judgement: Moderate   Modes of Intervention: Activity, Discussion, and Education  Patient Response to Interventions:  Attentive but passive observation for much of the session   Plan: Continue to engage patient in PT/OT groups 1 - 2x/week.  CHARM Hamilton Bertin PT, DPT 02/14/2024, 2:02 PM

## 2024-02-14 NOTE — Group Note (Signed)
 Recreation Therapy Group Note   Group Topic:Stress Management  Group Date: 02/14/2024 Start Time: 1415 End Time: 1440 Facilitators: Celestia Jeoffrey BRAVO, LRT, CTRS Location: Dayroom  Group Description: Meditation. LRT asks patients their current level of stress/anxiety from 1-10, with 10 being the highest. LRT educated on the benefits of meditation and relaxation techniques, and how it can apply to everyday life post-discharge. LRT and pt's followed along to an audio script of a guided meditation video. LRT asked pt their level of stress and anxiety once the prompt was finished. LRT facilitated post-activity processing to gain feedback on session.   Goal Area(s) Addressed:  Patient will practice using relaxation technique. Patient will identify a new coping skill.  Patient will follow multistep directions to reduce anxiety and stress.   Affect/Mood: N/A   Participation Level: Did not attend    Clinical Observations/Individualized Feedback: Patient did not attend.  Plan: Continue to engage patient in RT group sessions 2-3x/week.   Jeoffrey BRAVO Celestia, LRT, CTRS 02/14/2024 4:54 PM

## 2024-02-14 NOTE — Group Note (Unsigned)
 Date:  02/14/2024 Time:  11:59 AM  Group Topic/Focus:  Goals Group:   The focus of this group is to help patients establish daily goals to achieve during treatment and discuss how the patient can incorporate goal setting into their daily lives to aide in recovery.     Participation Level:  {BHH PARTICIPATION OZCZO:77735}  Participation Quality:  {BHH PARTICIPATION QUALITY:22265}  Affect:  {BHH AFFECT:22266}  Cognitive:  {BHH COGNITIVE:22267}  Insight: {BHH Insight2:20797}  Engagement in Group:  {BHH ENGAGEMENT IN HMNLE:77731}  Modes of Intervention:  {BHH MODES OF INTERVENTION:22269}  Additional Comments:  ***  Maglione,Yanet Balliet E 02/14/2024, 11:59 AM

## 2024-02-14 NOTE — Progress Notes (Addendum)
 [facility] MD Progress Note  02/14/2024 11:38 PM Karen [name]  MRN:  [id number]  Karen [name] is a 69 year old female with a significant psychiatric history including major depressive disorder, bipolar disorder, PTSD, and anxiety, as well as multiple complex medical comorbidities, who presents to the emergency department after an episode of self-inflicted cutting to the left wrist.  Subjective:  Chart reviewed, case discussed in multidisciplinary meeting, patient seen during rounds.    Patient met with the treatment team.  She was able to acknowledge the depression and anxiety she was going through by living alone.  She informs the team that her daughter is involved in her care and she was trying to get [employer] as patient will have hard time sometimes cooking.  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  She reports worsening anxiety.  She did acknowledge feeling hopeless and having suicidal thoughts with wishful thinking of being dead.  She denies auditory/visual hallucinations.  Patient reports being tried on multiple psychotropic medications including SSRIs and SNRIs which made her have more side effects than benefits.  Patient denies being on any benzos due to her cognitive impairment.  Patient reports having her mental health services at [facility]. Past Psychiatric History: see h&P Family History:  Family History  Problem Relation Age of Onset   Breast cancer Neg Hx    Social History:  Social History   Substance and Sexual Activity  Alcohol Use Not Currently   Comment: occ. wine maybe 1  month     Social History   Substance and Sexual Activity  Drug Use No    Social History   Socioeconomic History   Marital status: Divorced    Spouse name: Not on file   Number of children: Not on file   Years of education: Not on file   Highest education level: Not on file  Occupational  History   Not on file  Tobacco Use   Smoking status: Former    Current packs/day: 0.00    Average packs/day: 0.5 packs/day for 48.0 years (24.0 ttl pk-yrs)    Types: Cigarettes    Start date: 02/09/1962    Quit date: 02/09/2010    Years since quitting: 14.0   Smokeless tobacco: Never  Vaping Use   Vaping status: Never Used  Substance and Sexual Activity   Alcohol use: Not Currently    Comment: occ. wine maybe 1  month   Drug use: No   Sexual activity: Not on file  Other Topics Concern   Not on file  Social History Narrative   Not on file   Social Drivers of Health   Tobacco Use: Medium Risk (02/09/2024)   Patient History    Smoking Tobacco Use: Former    Smokeless Tobacco Use: Never    Passive Exposure: Not on file  Financial Resource Strain: Patient Declined (10/15/2023)   Received from [facility]   Overall Financial Resource Strain (CARDIA)    How hard is it for you to pay for the very basics like food, housing, medical care, and heating?: Patient declined  Recent Concern: Financial Resource Strain - Medium Risk (09/23/2023)   Received from [facility]   Overall Financial Resource Strain (CARDIA)    Difficulty of Paying Living Expenses: Somewhat hard  Food Insecurity: No Food Insecurity (02/09/2024)   Epic    Worried About Running Out of Food in the Last Year: Never  true    Ran Out of Food in the Last Year: Never true  Transportation Needs: No Transportation Needs (02/09/2024)   Epic    Lack of Transportation (Medical): No    Lack of Transportation (Non-Medical): No  Physical Activity: Not on file  Stress: Not on file  Social Connections: Socially Isolated (02/09/2024)   Social Connection and Isolation Panel    Frequency of Communication with Friends and Family: Three times a week    Frequency of Social Gatherings with Friends and Family: Three times a week    Attends Religious Services: Never    Active Member of Clubs or Organizations: No     Attends Banker Meetings: Never    Marital Status: Divorced  Depression (PHQ2-9): Not on file  Alcohol Screen: Low Risk (02/09/2024)   Alcohol Screen    Last Alcohol Screening Score (AUDIT): 0  Housing: Low Risk (02/09/2024)   Epic    Unable to Pay for Housing in the Last Year: No    Number of Times Moved in the Last Year: 0    Homeless in the Last Year: No  Utilities: Not At Risk (02/09/2024)   Epic    Threatened with loss of utilities: No  Health Literacy: Not on file   Past Medical History:  Past Medical History:  Diagnosis Date   Anemia    Anxiety    Arthritis    Breast cancer (HCC) 1995   left breast cancer - chemo and modified mastectomy   Depression    Dyspnea    Dysrhythmia    Essential thrombocytosis (HCC)    GERD (gastroesophageal reflux disease)    Heart murmur    History of hiatal hernia    Hyperlipidemia    Hyperthyroidism    Mitral valve prolapse 1997   Normal pressure hydrocephalus (HCC) 2022   shunted   Osteoporosis    Personal history of chemotherapy 1995   BREAST CA   Personal history of radiation therapy 1995   BREAST CA   Sjogren's syndrome    Vitamin D deficiency     Past Surgical History:  Procedure Laterality Date   AUGMENTATION MAMMAPLASTY Bilateral 1995   BREAST LUMPECTOMY Left 1995   COLONOSCOPY N/A 11/16/2023   Procedure: COLONOSCOPY;  Surgeon: Maryruth Ole DASEN, MD;  Location: [facility] ENDOSCOPY;  Service: Endoscopy;  Laterality: N/A;   COLONOSCOPY WITH PROPOFOL  N/A 07/16/2017   Procedure: COLONOSCOPY WITH PROPOFOL ;  Surgeon: Viktoria Lamar DASEN, MD;  Location: [facility] ENDOSCOPY;  Service: Endoscopy;  Laterality: N/A;   ESOPHAGOGASTRODUODENOSCOPY N/A 11/16/2023   Procedure: EGD (ESOPHAGOGASTRODUODENOSCOPY);  Surgeon: Maryruth Ole DASEN, MD;  Location: [facility] ENDOSCOPY;  Service: Endoscopy;  Laterality: N/A;   MASTECTOMY Left 1995   Tram flap W/IMPLANT   TONSILLECTOMY      Current Medications: Current Facility-Administered  Medications  Medication Dose Route Frequency Provider Last Rate Last Admin   acetaminophen  (TYLENOL ) tablet 650 mg  650 mg Oral Q6H PRN Smith, Annie B, NP   650 mg at 02/10/24 1833   alum & mag hydroxide-simeth (MAALOX/MYLANTA) 200-200-20 MG/5ML suspension 30 mL  30 mL Oral Q4H PRN Smith, Annie B, NP       atorvastatin  (LIPITOR ) tablet 80 mg  80 mg Oral Daily Smith, Annie B, NP   80 mg at 02/14/24 0931   [START ON 02/15/2024] collagenase  (SANTYL ) ointment   Topical Daily Alveria Mcglaughlin, MD       feeding supplement (ENSURE PLUS HIGH PROTEIN) liquid 237 mL  237 mL Oral BID  BM Madaram, Kondal R, MD   237 mL at 02/14/24 1453   LORazepam  (ATIVAN ) tablet 1 mg  1 mg Oral Q6H PRN Madaram, Kondal R, MD   1 mg at 02/14/24 2128   magnesium  hydroxide (MILK OF MAGNESIA) suspension 30 mL  30 mL Oral Daily PRN Smith, Annie B, NP       mirtazapine  (REMERON ) tablet 15 mg  15 mg Oral QHS Madaram, Kondal R, MD   15 mg at 02/14/24 2123   multivitamin with minerals tablet 1 tablet  1 tablet Oral Daily Smith, Annie B, NP   1 tablet at 02/14/24 9068   OLANZapine  (ZYPREXA ) injection 5 mg  5 mg Intramuscular TID PRN Smith, Annie B, NP       OLANZapine  zydis (ZYPREXA ) disintegrating tablet 5 mg  5 mg Oral TID PRN Smith, Annie B, NP       QUEtiapine  (SEROQUEL ) tablet 25 mg  25 mg Oral BID Madaram, Kondal R, MD   25 mg at 02/14/24 1724   raloxifene  (EVISTA ) tablet 60 mg  60 mg Oral Daily Smith, Annie B, NP   60 mg at 02/14/24 9062   thiamine  (VITAMIN B1) tablet 100 mg  100 mg Oral Daily Madaram, Kondal R, MD   100 mg at 02/14/24 0931   traZODone  (DESYREL ) tablet 150 mg  150 mg Oral QHS Smith, Annie B, NP   150 mg at 02/14/24 2123    Lab Results: No results found for this or any previous visit (from the past 48 hours).  Blood Alcohol level:  Lab Results  Component Value Date   [facility] <15 02/08/2024    Metabolic Disorder Labs: Lab Results  Component Value Date   HGBA1C 5.6 02/10/2024   MPG 114.02 02/10/2024   No  results found for: PROLACTIN Lab Results  Component Value Date   CHOL 143 02/10/2024   TRIG 74 02/10/2024   HDL 59 02/10/2024   CHOLHDL 2.4 02/10/2024   VLDL 15 02/10/2024   LDLCALC 69 02/10/2024    Physical Findings: AIMS:  , ,  ,  ,    CIWA:    COWS:      Psychiatric Specialty Exam:  Presentation  General Appearance: Appropriate for Environment  Eye Contact:Fair  Speech:Slow  Speech Volume:Decreased    Mood and Affect  Mood:Anxious; Depressed  Affect:Constricted   Thought Process  Thought Processes:Irrevelant  Orientation:Partial  Thought Content:Illogical  Hallucinations:Hallucinations: None  Ideas of Reference:None  Suicidal Thoughts:Suicidal Thoughts: No  Homicidal Thoughts:Homicidal Thoughts: No   Sensorium  Memory:Immediate Fair; Remote Fair  Judgment:Impaired  Insight:Shallow   Executive Functions  Concentration:Fair  Attention Span:Fair  Recall:Fair  Fund of Knowledge:Fair  Language:Fair   Psychomotor Activity  Psychomotor Activity:Psychomotor Activity: Normal  Musculoskeletal: Strength & Muscle Tone: within normal limits Gait & Station: normal Assets  Assets:Communication Skills; Social Support    Physical Exam: Physical Exam Vitals and nursing note reviewed.    ROS Blood pressure (!) 146/76, pulse 86, temperature (!) 96.8 F (36 C), resp. rate 14, height 5' 5 (1.651 m), weight 43.8 kg, SpO2 100%. Body mass index is 16.06 kg/m.  Diagnosis: Principal Problem:   MDD (major depressive disorder)   PLAN: Safety and Monitoring:  -- Voluntary admission to inpatient psychiatric unit for safety, stabilization and treatment  -- Daily contact with patient to assess and evaluate symptoms and progress in treatment  -- Patient's case to be discussed in multi-disciplinary team meeting  -- Observation Level : q15 minute checks  -- Vital  signs:  q12 hours  -- Precautions: suicide, elopement, and assault -- Encouraged  patient to participate in unit milieu and in scheduled group therapies  2. Psychiatric Treatment:  Scheduled Medications:  Remeron  15 mg nightly Seroquel  25 mg twice daily Trazodone  150 mg nightly   -- The risks/benefits/side-effects/alternatives to this medication were discussed in detail with the patient and time was given for questions. The patient consents to medication trial.  3. Medical Issues Being Addressed:     4. Discharge Planning:   -- Social work and case management to assist with discharge planning and identification of hospital follow-up needs prior to discharge  -- Estimated LOS: 3-4 days  Rowene Suto, MD 02/14/2024, 11:38 PM

## 2024-02-15 DIAGNOSIS — F329 Major depressive disorder, single episode, unspecified: Secondary | ICD-10-CM | POA: Diagnosis not present

## 2024-02-15 MED ORDER — LURASIDONE HCL 20 MG PO TABS
40.0000 mg | ORAL_TABLET | Freq: Every day | ORAL | Status: DC
  Administered 2024-02-15: 40 mg via ORAL
  Filled 2024-02-15: qty 2

## 2024-02-15 NOTE — BHH Counselor (Signed)
 CSW contacted pt's daughter Duwaine Collier [phone]) with provider per messages from nursing staff stating that pt's daughter would like a call.   Provider gave clinical updates regarding pt progression.   CSW and provider will call pt's daughter back with updates regarding specific discharge date.   Lum Croft, MSW, [facility] 02/18/2024 9:24 AM

## 2024-02-15 NOTE — Progress Notes (Signed)
" °   02/14/24 2100  Psych Admission Type (Psych Patients Only)  Admission Status Voluntary  Psychosocial Assessment  Patient Complaints Anxiety  Eye Contact Fair  Facial Expression Sad  Affect Sad  Speech Logical/coherent  Interaction Minimal;Guarded  Motor Activity Slow  Appearance/Hygiene In scrubs  Behavior Characteristics Cooperative;Anxious  Mood Anxious  Thought Process  Coherency WDL  Content WDL  Delusions None reported or observed  Perception WDL  Hallucination None reported or observed  Judgment Impaired  Confusion None  Danger to Self  Current suicidal ideation? Denies  Self-Injurious Behavior No self-injurious ideation or behavior indicators observed or expressed   Agreement Not to Harm Self Yes  Description of Agreement verbal  Danger to Others  Danger to Others None reported or observed   Mood/Behavior:  Anxious. Cooperative. Mild confusion. Pt awake about 0030 and seen walking into another pt room. Staff intervene and redirected pt back to her room. Per pt I was trying to go to the bathroom. No further concerns or incidents noted.   Psych assessment: Denies SI/HI and AVH.     Interaction / Group attendance:  Minimal interaction with peers and staff. Isolative did not attend group.    Medication/ PRNs: Compliant with scheduled medications. Required PRNs Ativan  for anxiety pt reports I have anxiety all the time it does not go away medication noted decrease effective.   Pain: Denies   Wound care completed this morning at 0600.   15 min checks in place for safety. "

## 2024-02-15 NOTE — Progress Notes (Signed)
 During pt visit with daughter Duwaine Collier pt daughter left a paper copy of Advance directives health care power of attorney and living will. Copy placed in patients chart to be scanned.

## 2024-02-15 NOTE — Group Note (Signed)
 Date:  02/15/2024 Time:  4:03 PM  Group Topic/Focus:  Making Healthy Choices:   The focus of this group is to help patients identify negative/unhealthy choices they were using prior to admission and identify positive/healthier coping strategies to replace them upon discharge.    Participation Level:  Did Not Attend   Karen [name] 02/15/2024, 4:03 PM

## 2024-02-15 NOTE — Plan of Care (Signed)
" °  Problem: Education: Goal: Knowledge of [facility] Education information/materials will improve Outcome: Progressing Goal: Emotional status will improve Outcome: Progressing Goal: Mental status will improve Outcome: Progressing Goal: Verbalization of understanding the information provided will improve Outcome: Progressing   Problem: Activity: Goal: Interest or engagement in activities will improve Outcome: Progressing Goal: Sleeping patterns will improve Outcome: Progressing   Problem: Coping: Goal: Ability to verbalize frustrations and anger appropriately will improve Outcome: Progressing Goal: Ability to demonstrate self-control will improve Outcome: Progressing   Problem: Health Behavior/Discharge Planning: Goal: Identification of resources available to assist in meeting health care needs will improve Outcome: Progressing Goal: Compliance with treatment plan for underlying cause of condition will improve Outcome: Progressing   Problem: Physical Regulation: Goal: Ability to maintain clinical measurements within normal limits will improve Outcome: Progressing   Problem: Safety: Goal: Periods of time without injury will increase Outcome: Progressing   Problem: Education: Goal: Ability to state activities that reduce stress will improve Outcome: Progressing   Problem: Education: Goal: Utilization of techniques to improve thought processes will improve Outcome: Progressing   "

## 2024-02-15 NOTE — BHH Counselor (Signed)
 CSW received return call from Clifford at [employer], CSW missed call.  CSW called Rosina back, call went to VM, CSW left HIPAA compliant VM requesting return call.   Lum Croft, MSW, [place] 02/15/2024 10:40 AM

## 2024-02-15 NOTE — Progress Notes (Signed)
 Discharge anticipated back to her residence by January 9th    02/15/24 1400  Psych Admission Type (Psych Patients Only)  Admission Status Voluntary  Psychosocial Assessment  Patient Complaints Anxiety;Depression  Eye Contact Fair  Facial Expression Sad  Affect Sad  Speech Logical/coherent  Interaction Cautious;Minimal  Motor Activity Slow  Appearance/Hygiene In scrubs  Behavior Characteristics Cooperative  Mood Depressed  Thought Process  Coherency WDL  Content WDL  Delusions None reported or observed  Perception WDL  Hallucination None reported or observed  Judgment Impaired  Confusion Mild  Danger to Self  Current suicidal ideation? Denies  Agreement Not to Harm Self Yes  Description of Agreement verbal  Danger to Others  Danger to Others None reported or observed

## 2024-02-15 NOTE — Progress Notes (Signed)
" °   02/15/24 2100  Psych Admission Type (Psych Patients Only)  Admission Status Voluntary  Psychosocial Assessment  Patient Complaints None  Eye Contact Fair  Facial Expression Flat  Affect Appropriate to circumstance  Speech Logical/coherent  Interaction Minimal  Motor Activity Slow  Appearance/Hygiene In scrubs;Unremarkable  Behavior Characteristics Cooperative  Mood Pleasant  Thought Process  Coherency WDL  Content WDL  Delusions None reported or observed  Perception WDL  Hallucination None reported or observed  Judgment Impaired  Confusion Mild  Danger to Self  Current suicidal ideation? Denies  Self-Injurious Behavior No self-injurious ideation or behavior indicators observed or expressed   Agreement Not to Harm Self Yes  Description of Agreement verbal  Danger to Others  Danger to Others None reported or observed   Mood/Behavior:  Calm and cooperative. Mild confusion.   Psych assessment: Denies SI/HI and AVH.     Interaction / Group attendance:  Minimal interaction with peers and staff. Isolative did not attend group.    Medication/ PRNs: Compliant with scheduled medications. No prns required.   Pain: Denies   Wound care completed.   15 min checks in place for safety. "

## 2024-02-15 NOTE — Plan of Care (Signed)
?  Problem: Education: ?Goal: Knowledge of [facility] Education information/materials will improve ?Outcome: Progressing ?Goal: Emotional status will improve ?Outcome: Progressing ?Goal: Mental status will improve ?Outcome: Progressing ?Goal: Verbalization of understanding the information provided will improve ?Outcome: Progressing ?  ?Problem: Activity: ?Goal: Interest or engagement in activities will improve ?Outcome: Progressing ?Goal: Sleeping patterns will improve ?Outcome: Progressing ?  ?Problem: Coping: ?Goal: Ability to verbalize frustrations and anger appropriately will improve ?Outcome: Progressing ?Goal: Ability to demonstrate self-control will improve ?Outcome: Progressing ?  ?Problem: Health Behavior/Discharge Planning: ?Goal: Identification of resources available to assist in meeting health care needs will improve ?Outcome: Progressing ?Goal: Compliance with treatment plan for underlying cause of condition will improve ?Outcome: Progressing ?  ?Problem: Physical Regulation: ?Goal: Ability to maintain clinical measurements within normal limits will improve ?Outcome: Progressing ?  ?Problem: Safety: ?Goal: Periods of time without injury will increase ?Outcome: Progressing ?  ?Problem: Education: ?Goal: Ability to state activities that reduce stress will improve ?Outcome: Progressing ?  ?

## 2024-02-15 NOTE — BHH Counselor (Signed)
 CSW contacted [employer] [facility]) per pt's request to see about getting her set up with services.   CSW transferred to Ashley's VM, left HIPAA compliant VM requesting return call.   Lum Croft, MSW, [place] 02/15/2024 10:29 AM

## 2024-02-15 NOTE — Progress Notes (Signed)
 [facility] MD Progress Note  02/15/2024 8:13 PM Karen [name]  MRN:  [id number]  Karen [name] is a 69 year old female with a significant psychiatric history including major depressive disorder, bipolar disorder, PTSD, and anxiety, as well as multiple complex medical comorbidities, who presents to the emergency department after an episode of self-inflicted cutting to the left wrist.  Subjective:  Chart reviewed, case discussed in multidisciplinary meeting, patient seen during rounds.   Patient is noted to be resting in bed.  She denies having any pain or problems over the cut wounds on her wrist.  She reports struggling with sleep due to the noise on the unit at night.  She reports improvement in her depression and anxiety.  She denies SI/HI/plan.  She remains discharge focused and wants to get home. Provider and social worker reached out to patient's daughter who expressed her concern about patient's cognitive impairment, worsening anxiety due to being her living condition of living alone.  Daughter expressed her concern about patient being home alone and was requesting services in the community.  Daughter also informed the team that patient did well in the past on Latuda . Provider discussed Latuda  with patient and patient did acknowledge that it helped with her mood swings.  PT/OT eval is requested to evaluate the need for higher level of placement in the community  Past Psychiatric History: see h&P Family History:  Family History  Problem Relation Age of Onset   Breast cancer Neg Hx    Social History:  Social History   Substance and Sexual Activity  Alcohol Use Not Currently   Comment: occ. wine maybe 1  month     Social History   Substance and Sexual Activity  Drug Use No    Social History   Socioeconomic History   Marital status: Divorced    Spouse name: Not on file   Number of children: Not on file   Years of education: Not on file   Highest education level: Not on file   Occupational History   Not on file  Tobacco Use   Smoking status: Former    Current packs/day: 0.00    Average packs/day: 0.5 packs/day for 48.0 years (24.0 ttl pk-yrs)    Types: Cigarettes    Start date: 02/09/1962    Quit date: 02/09/2010    Years since quitting: 14.0   Smokeless tobacco: Never  Vaping Use   Vaping status: Never Used  Substance and Sexual Activity   Alcohol use: Not Currently    Comment: occ. wine maybe 1  month   Drug use: No   Sexual activity: Not on file  Other Topics Concern   Not on file  Social History Narrative   Not on file   Social Drivers of Health   Tobacco Use: Medium Risk (02/09/2024)   Patient History    Smoking Tobacco Use: Former    Smokeless Tobacco Use: Never    Passive Exposure: Not on file  Financial Resource Strain: Patient Declined (10/15/2023)   Received from [facility]   Overall Financial Resource Strain (CARDIA)    How hard is it for you to pay for the very basics like food, housing, medical care, and heating?: Patient declined  Recent Concern: Financial Resource Strain - Medium Risk (09/23/2023)   Received from [facility]   Overall Financial Resource Strain (CARDIA)    Difficulty of Paying Living Expenses: Somewhat hard  Food Insecurity: No Food Insecurity (02/09/2024)   Epic    Worried About  Running Out of Food in the Last Year: Never true    Ran Out of Food in the Last Year: Never true  Transportation Needs: No Transportation Needs (02/09/2024)   Epic    Lack of Transportation (Medical): No    Lack of Transportation (Non-Medical): No  Physical Activity: Not on file  Stress: Not on file  Social Connections: Socially Isolated (02/09/2024)   Social Connection and Isolation Panel    Frequency of Communication with Friends and Family: Three times a week    Frequency of Social Gatherings with Friends and Family: Three times a week    Attends Religious Services: Never    Active Member of Clubs or  Organizations: No    Attends Banker Meetings: Never    Marital Status: Divorced  Depression (PHQ2-9): Not on file  Alcohol Screen: Low Risk (02/09/2024)   Alcohol Screen    Last Alcohol Screening Score (AUDIT): 0  Housing: Low Risk (02/09/2024)   Epic    Unable to Pay for Housing in the Last Year: No    Number of Times Moved in the Last Year: 0    Homeless in the Last Year: No  Utilities: Not At Risk (02/09/2024)   Epic    Threatened with loss of utilities: No  Health Literacy: Not on file   Past Medical History:  Past Medical History:  Diagnosis Date   Anemia    Anxiety    Arthritis    Breast cancer (HCC) 1995   left breast cancer - chemo and modified mastectomy   Depression    Dyspnea    Dysrhythmia    Essential thrombocytosis (HCC)    GERD (gastroesophageal reflux disease)    Heart murmur    History of hiatal hernia    Hyperlipidemia    Hyperthyroidism    Mitral valve prolapse 1997   Normal pressure hydrocephalus (HCC) 2022   shunted   Osteoporosis    Personal history of chemotherapy 1995   BREAST CA   Personal history of radiation therapy 1995   BREAST CA   Sjogren's syndrome    Vitamin D deficiency     Past Surgical History:  Procedure Laterality Date   AUGMENTATION MAMMAPLASTY Bilateral 1995   BREAST LUMPECTOMY Left 1995   COLONOSCOPY N/A 11/16/2023   Procedure: COLONOSCOPY;  Surgeon: Maryruth Ole DASEN, MD;  Location: [facility] ENDOSCOPY;  Service: Endoscopy;  Laterality: N/A;   COLONOSCOPY WITH PROPOFOL  N/A 07/16/2017   Procedure: COLONOSCOPY WITH PROPOFOL ;  Surgeon: Viktoria Lamar DASEN, MD;  Location: [facility] ENDOSCOPY;  Service: Endoscopy;  Laterality: N/A;   ESOPHAGOGASTRODUODENOSCOPY N/A 11/16/2023   Procedure: EGD (ESOPHAGOGASTRODUODENOSCOPY);  Surgeon: Maryruth Ole DASEN, MD;  Location: [facility] ENDOSCOPY;  Service: Endoscopy;  Laterality: N/A;   MASTECTOMY Left 1995   Tram flap W/IMPLANT   TONSILLECTOMY      Current Medications: Current  Facility-Administered Medications  Medication Dose Route Frequency Provider Last Rate Last Admin   acetaminophen  (TYLENOL ) tablet 650 mg  650 mg Oral Q6H PRN Smith, Annie B, NP   650 mg at 02/10/24 1833   alum & mag hydroxide-simeth (MAALOX/MYLANTA) 200-200-20 MG/5ML suspension 30 mL  30 mL Oral Q4H PRN Smith, Annie B, NP       atorvastatin  (LIPITOR ) tablet 80 mg  80 mg Oral Daily Smith, Annie B, NP   80 mg at 02/15/24 9060   collagenase  (SANTYL ) ointment   Topical Daily Arielis Leonhart, MD   Given at 02/15/24 0541   feeding supplement (ENSURE PLUS HIGH PROTEIN)  liquid 237 mL  237 mL Oral BID BM Madaram, Kondal R, MD   237 mL at 02/15/24 1312   LORazepam  (ATIVAN ) tablet 1 mg  1 mg Oral Q6H PRN Madaram, Kondal R, MD   1 mg at 02/15/24 1611   magnesium  hydroxide (MILK OF MAGNESIA) suspension 30 mL  30 mL Oral Daily PRN Smith, Annie B, NP       mirtazapine  (REMERON ) tablet 15 mg  15 mg Oral QHS Madaram, Kondal R, MD   15 mg at 02/14/24 2123   multivitamin with minerals tablet 1 tablet  1 tablet Oral Daily Smith, Annie B, NP   1 tablet at 02/15/24 9060   OLANZapine  (ZYPREXA ) injection 5 mg  5 mg Intramuscular TID PRN Smith, Annie B, NP       OLANZapine  zydis (ZYPREXA ) disintegrating tablet 5 mg  5 mg Oral TID PRN Smith, Annie B, NP       QUEtiapine  (SEROQUEL ) tablet 25 mg  25 mg Oral BID Madaram, Kondal R, MD   25 mg at 02/15/24 9060   raloxifene  (EVISTA ) tablet 60 mg  60 mg Oral Daily Smith, Annie B, NP   60 mg at 02/15/24 0941   thiamine  (VITAMIN B1) tablet 100 mg  100 mg Oral Daily Madaram, Kondal R, MD   100 mg at 02/15/24 9060   traZODone  (DESYREL ) tablet 150 mg  150 mg Oral QHS Smith, Annie B, NP   150 mg at 02/14/24 2123    Lab Results: No results found for this or any previous visit (from the past 48 hours).  Blood Alcohol level:  Lab Results  Component Value Date   [facility] <15 02/08/2024    Metabolic Disorder Labs: Lab Results  Component Value Date   HGBA1C 5.6 02/10/2024   MPG 114.02  02/10/2024   No results found for: PROLACTIN Lab Results  Component Value Date   CHOL 143 02/10/2024   TRIG 74 02/10/2024   HDL 59 02/10/2024   CHOLHDL 2.4 02/10/2024   VLDL 15 02/10/2024   LDLCALC 69 02/10/2024    Physical Findings: AIMS:  , ,  ,  ,    CIWA:    COWS:      Psychiatric Specialty Exam:  Presentation  General Appearance: Appropriate for Environment  Eye Contact:Fair  Speech:Slow  Speech Volume:Decreased    Mood and Affect  Mood:Anxious; Depressed  Affect:Constricted   Thought Process  Thought Processes:Irrevelant  Orientation:Partial  Thought Content:Illogical  Hallucinations:Hallucinations: None  Ideas of Reference:None  Suicidal Thoughts:Suicidal Thoughts: No  Homicidal Thoughts:Homicidal Thoughts: No   Sensorium  Memory:Immediate Fair; Remote Fair  Judgment:Impaired  Insight:Shallow   Executive Functions  Concentration:Fair  Attention Span:Fair  Recall:Fair  Fund of Knowledge:Fair  Language:Fair   Psychomotor Activity  Psychomotor Activity:Psychomotor Activity: Normal  Musculoskeletal: Strength & Muscle Tone: within normal limits Gait & Station: normal Assets  Assets:Communication Skills; Social Support    Physical Exam: Physical Exam Vitals and nursing note reviewed.    ROS Blood pressure 104/72, pulse 88, temperature 98.7 F (37.1 C), temperature source Oral, resp. rate 18, height 5' 5 (1.651 m), weight 43.8 kg, SpO2 97%. Body mass index is 16.06 kg/m.  Diagnosis: Principal Problem:   MDD (major depressive disorder)   PLAN: Safety and Monitoring:  -- Voluntary admission to inpatient psychiatric unit for safety, stabilization and treatment  -- Daily contact with patient to assess and evaluate symptoms and progress in treatment  -- Patient's case to be discussed in multi-disciplinary team meeting  --  Observation Level : q15 minute checks  -- Vital signs:  q12 hours  -- Precautions: suicide,  elopement, and assault -- Encouraged patient to participate in unit milieu and in scheduled group therapies  2. Psychiatric Treatment:  Scheduled Medications:  Seroquel  25 mg twice daily Remeron  15 mg nightly Trazodone  150 mg nightly Patient gave consent for Latuda  40 mg nightly for mood stabilization   -- The risks/benefits/side-effects/alternatives to this medication were discussed in detail with the patient and time was given for questions. The patient consents to medication trial.  3. Medical Issues Being Addressed:     4. Discharge Planning:   -- Social work and case management to assist with discharge planning and identification of hospital follow-up needs prior to discharge  -- Estimated LOS: 3-4 days  Jessicalynn Deshong, MD 02/15/2024, 8:13 PM

## 2024-02-15 NOTE — Group Note (Signed)
 Date:  02/15/2024 Time:  9:09 PM  Group Topic/Focus:  Wrap-Up Group:   The focus of this group is to help patients review their daily goal of treatment and discuss progress on daily workbooks.    Participation Level:  Did Not Attend   Affect:     Cognitive:     Insight: None  Engagement in Group:    Modes of Intervention:     Additional Comments:    Cecilio GORMAN Getting 02/15/2024, 9:09 PM

## 2024-02-15 NOTE — Plan of Care (Signed)
  Problem: Education: Goal: Ability to state activities that reduce stress will improve Outcome: Progressing   Problem: Education: Goal: Utilization of techniques to improve thought processes will improve Outcome: Progressing   

## 2024-02-15 NOTE — Group Note (Signed)
 Recreation Therapy Group Note   Group Topic:Animal Assisted Therapy   Group Date: 02/15/2024 Start Time: 1020 End Time: 1045 Facilitators: Celestia Jeoffrey BRAVO, LRT, CTRS Location: Dayroom  Group Description: AAA. Animal-Assisted Activity provides opportunities for motivational, educational, therapeutic and/or recreational benefits to enhance quality of life. Karen [name] and Rollo visited the unit to interact with patients.   Goal Areas Addressed:  Reduced anxiety and stress Improved mood Increased social interaction Enhanced communication skills Reduced loneliness and isolation Improved emotional regulation   Affect/Mood: Appropriate   Participation Level: Active and Engaged   Participation Quality: Independent   Behavior: Calm and Cooperative   Speech/Thought Process: Coherent   Insight: Fair   Judgement: Fair    Modes of Intervention: Activity   Patient Response to Interventions:  Attentive and Receptive   Education Outcome:  In group clarification offered    Clinical Observations/Individualized Feedback: Karen [name] was active in their participation of session activities and group discussion. Pt interacted well with LRT and peers duration of session.    Plan: Continue to engage patient in RT group sessions 2-3x/week.   Jeoffrey BRAVO Celestia, LRT, CTRS 02/15/2024 12:02 PM

## 2024-02-15 NOTE — Group Note (Signed)
 LCSW Group Therapy Note  Group Date: 02/15/2024 Start Time: 1330 End Time: 1400   Type of Therapy and Topic:  Group Therapy - Healthy vs Unhealthy Coping Skills  Participation Level:  Did Not Attend   Description of Group The focus of this group was to determine what unhealthy coping techniques typically are used by group members and what healthy coping techniques would be helpful in coping with various problems. Patients were guided in becoming aware of the differences between healthy and unhealthy coping techniques. Patients were asked to identify 2-3 healthy coping skills they would like to learn to use more effectively.  Therapeutic Goals Patients learned that coping is what human beings do all day long to deal with various situations in their lives Patients defined and discussed healthy vs unhealthy coping techniques Patients identified their preferred coping techniques and identified whether these were healthy or unhealthy Patients determined 2-3 healthy coping skills they would like to become more familiar with and use more often. Patients provided support and ideas to each other   Summary of Patient Progress:  X   Therapeutic Modalities Cognitive Behavioral Therapy Motivational Interviewing  Lum Karen [name], [place] 02/15/2024  2:29 PM

## 2024-02-15 NOTE — BHH Counselor (Signed)
 CSW received return call from [place] at [employer], Rosina took referral from CSW, reports she will contact pt's daughter to confirm if pt will be private pay or grant funded.   CSW will inform pt.   Lum Croft, MSW, [place] 02/15/2024 2:31 PM

## 2024-02-16 DIAGNOSIS — F329 Major depressive disorder, single episode, unspecified: Secondary | ICD-10-CM | POA: Diagnosis not present

## 2024-02-16 MED ORDER — LURASIDONE HCL 20 MG PO TABS
20.0000 mg | ORAL_TABLET | Freq: Every day | ORAL | Status: DC
  Administered 2024-02-17 – 2024-02-21 (×5): 20 mg via ORAL
  Filled 2024-02-16 (×5): qty 1

## 2024-02-16 NOTE — Progress Notes (Signed)
 [facility] MD Progress Note  02/16/2024 12:24 PM Karen [name]  MRN:  [id number]  Karen [name] is a 69 year old female with a significant psychiatric history including major depressive disorder, bipolar disorder, PTSD, and anxiety, as well as multiple complex medical comorbidities, who presents to the emergency department after an episode of self-inflicted cutting to the left wrist.  Subjective:  Chart reviewed, case discussed in multidisciplinary meeting, patient seen during rounds.   Patient is noted to be resting in bed.  She reports with the Latuda  40 mg and Seroquel  she felt deeply sedated and was unable to handle her ADLs last night.  Provider and patient discussed about reducing the dose of Latuda  to 20 mg with breakfast to minimize the additional sedation due to the mix of Seroquel  and Latuda   Past Psychiatric History: see h&P Family History:  Family History  Problem Relation Age of Onset   Breast cancer Neg Hx    Social History:  Social History   Substance and Sexual Activity  Alcohol Use Not Currently   Comment: occ. wine maybe 1  month     Social History   Substance and Sexual Activity  Drug Use No    Social History   Socioeconomic History   Marital status: Divorced    Spouse name: Not on file   Number of children: Not on file   Years of education: Not on file   Highest education level: Not on file  Occupational History   Not on file  Tobacco Use   Smoking status: Former    Current packs/day: 0.00    Average packs/day: 0.5 packs/day for 48.0 years (24.0 ttl pk-yrs)    Types: Cigarettes    Start date: 02/09/1962    Quit date: 02/09/2010    Years since quitting: 14.0   Smokeless tobacco: Never  Vaping Use   Vaping status: Never Used  Substance and Sexual Activity   Alcohol use: Not Currently    Comment: occ. wine maybe 1  month   Drug use: No   Sexual activity: Not on file  Other Topics Concern   Not on file  Social History Narrative   Not on file    Social Drivers of Health   Tobacco Use: Medium Risk (02/09/2024)   Patient History    Smoking Tobacco Use: Former    Smokeless Tobacco Use: Never    Passive Exposure: Not on file  Financial Resource Strain: Patient Declined (10/15/2023)   Received from [facility]   Overall Financial Resource Strain (CARDIA)    How hard is it for you to pay for the very basics like food, housing, medical care, and heating?: Patient declined  Recent Concern: Financial Resource Strain - Medium Risk (09/23/2023)   Received from [facility]   Overall Financial Resource Strain (CARDIA)    Difficulty of Paying Living Expenses: Somewhat hard  Food Insecurity: No Food Insecurity (02/09/2024)   Epic    Worried About Running Out of Food in the Last Year: Never true    Ran Out of Food in the Last Year: Never true  Transportation Needs: No Transportation Needs (02/09/2024)   Epic    Lack of Transportation (Medical): No    Lack of Transportation (Non-Medical): No  Physical Activity: Not on file  Stress: Not on file  Social Connections: Socially Isolated (02/09/2024)   Social Connection and Isolation Panel    Frequency of Communication with Friends and Family: Three times a week    Frequency of Social Gatherings  with Friends and Family: Three times a week    Attends Religious Services: Never    Active Member of Clubs or Organizations: No    Attends Banker Meetings: Never    Marital Status: Divorced  Depression (PHQ2-9): Not on file  Alcohol Screen: Low Risk (02/09/2024)   Alcohol Screen    Last Alcohol Screening Score (AUDIT): 0  Housing: Low Risk (02/09/2024)   Epic    Unable to Pay for Housing in the Last Year: No    Number of Times Moved in the Last Year: 0    Homeless in the Last Year: No  Utilities: Not At Risk (02/09/2024)   Epic    Threatened with loss of utilities: No  Health Literacy: Not on file   Past Medical History:  Past Medical History:  Diagnosis  Date   Anemia    Anxiety    Arthritis    Breast cancer (HCC) 1995   left breast cancer - chemo and modified mastectomy   Depression    Dyspnea    Dysrhythmia    Essential thrombocytosis (HCC)    GERD (gastroesophageal reflux disease)    Heart murmur    History of hiatal hernia    Hyperlipidemia    Hyperthyroidism    Mitral valve prolapse 1997   Normal pressure hydrocephalus (HCC) 2022   shunted   Osteoporosis    Personal history of chemotherapy 1995   BREAST CA   Personal history of radiation therapy 1995   BREAST CA   Sjogren's syndrome    Vitamin D deficiency     Past Surgical History:  Procedure Laterality Date   AUGMENTATION MAMMAPLASTY Bilateral 1995   BREAST LUMPECTOMY Left 1995   COLONOSCOPY N/A 11/16/2023   Procedure: COLONOSCOPY;  Surgeon: Maryruth Ole DASEN, MD;  Location: [facility] ENDOSCOPY;  Service: Endoscopy;  Laterality: N/A;   COLONOSCOPY WITH PROPOFOL  N/A 07/16/2017   Procedure: COLONOSCOPY WITH PROPOFOL ;  Surgeon: Viktoria Lamar DASEN, MD;  Location: [facility] ENDOSCOPY;  Service: Endoscopy;  Laterality: N/A;   ESOPHAGOGASTRODUODENOSCOPY N/A 11/16/2023   Procedure: EGD (ESOPHAGOGASTRODUODENOSCOPY);  Surgeon: Maryruth Ole DASEN, MD;  Location: [facility] ENDOSCOPY;  Service: Endoscopy;  Laterality: N/A;   MASTECTOMY Left 1995   Tram flap W/IMPLANT   TONSILLECTOMY      Current Medications: Current Facility-Administered Medications  Medication Dose Route Frequency Provider Last Rate Last Admin   acetaminophen  (TYLENOL ) tablet 650 mg  650 mg Oral Q6H PRN Smith, Annie B, NP   650 mg at 02/10/24 1833   alum & mag hydroxide-simeth (MAALOX/MYLANTA) 200-200-20 MG/5ML suspension 30 mL  30 mL Oral Q4H PRN Smith, Annie B, NP       atorvastatin  (LIPITOR ) tablet 80 mg  80 mg Oral Daily Smith, Annie B, NP   80 mg at 02/16/24 9153   collagenase  (SANTYL ) ointment   Topical Daily Wessley Emert, MD   Given at 02/16/24 [phone]   feeding supplement (ENSURE PLUS HIGH PROTEIN) liquid 237 mL   237 mL Oral BID BM Madaram, Kondal R, MD   237 mL at 02/16/24 1017   lurasidone  (LATUDA ) tablet 40 mg  40 mg Oral QHS Roopa Graver, MD   40 mg at 02/15/24 2102   magnesium  hydroxide (MILK OF MAGNESIA) suspension 30 mL  30 mL Oral Daily PRN Smith, Annie B, NP       mirtazapine  (REMERON ) tablet 15 mg  15 mg Oral QHS Madaram, Kondal R, MD   15 mg at 02/15/24 2102   multivitamin with minerals  tablet 1 tablet  1 tablet Oral Daily Smith, Annie B, NP   1 tablet at 02/16/24 9153   OLANZapine  (ZYPREXA ) injection 5 mg  5 mg Intramuscular TID PRN Smith, Annie B, NP       OLANZapine  zydis (ZYPREXA ) disintegrating tablet 5 mg  5 mg Oral TID PRN Smith, Annie B, NP       QUEtiapine  (SEROQUEL ) tablet 25 mg  25 mg Oral BID Madaram, Kondal R, MD   25 mg at 02/16/24 0846   raloxifene  (EVISTA ) tablet 60 mg  60 mg Oral Daily Smith, Annie B, NP   60 mg at 02/16/24 0846   thiamine  (VITAMIN B1) tablet 100 mg  100 mg Oral Daily Madaram, Kondal R, MD   100 mg at 02/16/24 0846   traZODone  (DESYREL ) tablet 150 mg  150 mg Oral QHS Smith, Annie B, NP   150 mg at 02/15/24 2102    Lab Results: No results found for this or any previous visit (from the past 48 hours).  Blood Alcohol level:  Lab Results  Component Value Date   [facility] <15 02/08/2024    Metabolic Disorder Labs: Lab Results  Component Value Date   HGBA1C 5.6 02/10/2024   MPG 114.02 02/10/2024   No results found for: PROLACTIN Lab Results  Component Value Date   CHOL 143 02/10/2024   TRIG 74 02/10/2024   HDL 59 02/10/2024   CHOLHDL 2.4 02/10/2024   VLDL 15 02/10/2024   LDLCALC 69 02/10/2024    Physical Findings: AIMS:  , ,  ,  ,    CIWA:    COWS:       02/16/2024   12:00 PM  Montreal Cognitive Assessment   Visuospatial/ Executive (0/5) 4  Naming (0/3) 3  Attention: Read list of digits (0/2) 2  Attention: Read list of letters (0/1) 1  Attention: Serial 7 subtraction starting at 100 (0/3) 1  Language: Repeat phrase (0/2) 2  Language :  Fluency (0/1) 1  Abstraction (0/2) 2  Delayed Recall (0/5) 0  Orientation (0/6) 5  Total 21  Adjusted Score (based on education) 21     Psychiatric Specialty Exam:  Presentation  General Appearance: Appropriate for Environment  Eye Contact:Fair  Speech:Slow  Speech Volume:Decreased    Mood and Affect  Mood:Anxious; Depressed  Affect:Constricted   Thought Process  Thought Processes:Irrevelant  Orientation:Partial  Thought Content:Illogical  Hallucinations:No data recorded  Ideas of Reference:None  Suicidal Thoughts:No data recorded  Homicidal Thoughts:No data recorded   Sensorium  Memory:Immediate Fair; Remote Fair  Judgment:Impaired  Insight:Shallow   Executive Functions  Concentration:Fair  Attention Span:Fair  Recall:Fair  Fund of Knowledge:Fair  Language:Fair   Psychomotor Activity  Psychomotor Activity:No data recorded  Musculoskeletal: Strength & Muscle Tone: within normal limits Gait & Station: normal Assets  Assets:Communication Skills; Social Support    Physical Exam: Physical Exam Vitals and nursing note reviewed.    ROS Blood pressure 116/65, pulse (!) 102, temperature (!) 96.9 F (36.1 C), resp. rate 18, height 5' 5 (1.651 m), weight 43.8 kg, SpO2 99%. Body mass index is 16.06 kg/m.  Diagnosis: Principal Problem:   MDD (major depressive disorder)   PLAN: Safety and Monitoring:  -- Voluntary admission to inpatient psychiatric unit for safety, stabilization and treatment  -- Daily contact with patient to assess and evaluate symptoms and progress in treatment  -- Patient's case to be discussed in multi-disciplinary team meeting  -- Observation Level : q15 minute checks  -- Vital signs:  q12 hours  -- Precautions: suicide, elopement, and assault -- Encouraged patient to participate in unit milieu and in scheduled group therapies  2. Psychiatric Treatment:  Scheduled Medications:  Seroquel  25 mg twice  daily Remeron  15 mg nightly Trazodone  150 mg nightly Latuda  changed to 20 mg every morning   -- The risks/benefits/side-effects/alternatives to this medication were discussed in detail with the patient and time was given for questions. The patient consents to medication trial.  3. Medical Issues Being Addressed:     4. Discharge Planning:   -- Social work and case management to assist with discharge planning and identification of hospital follow-up needs prior to discharge  -- Estimated LOS: 3-4 days  Emlyn Maves, MD 02/16/2024, 12:24 PM

## 2024-02-16 NOTE — Progress Notes (Signed)
 SI/HI/AVH: denies all  Behavior/Mood: cooperative/depressed    Interaction/Group attendance: cautious/ 3 of 3 groups   Medication/PRNs: compliant. Patient will be starting Latuda  20mg  with breakfast /no PRN's   Pain: denies  Other: see daily wound care orders for L wrist

## 2024-02-16 NOTE — Plan of Care (Signed)
" °  Problem: Education: Goal: Knowledge of [facility] Education information/materials will improve Outcome: Progressing Goal: Emotional status will improve Outcome: Progressing Goal: Mental status will improve Outcome: Progressing Goal: Verbalization of understanding the information provided will improve Outcome: Progressing   Problem: Activity: Goal: Interest or engagement in activities will improve Outcome: Progressing Goal: Sleeping patterns will improve Outcome: Progressing   Problem: Coping: Goal: Ability to verbalize frustrations and anger appropriately will improve Outcome: Progressing Goal: Ability to demonstrate self-control will improve Outcome: Progressing   Problem: Health Behavior/Discharge Planning: Goal: Identification of resources available to assist in meeting health care needs will improve Outcome: Progressing Goal: Compliance with treatment plan for underlying cause of condition will improve Outcome: Progressing   Problem: Physical Regulation: Goal: Ability to maintain clinical measurements within normal limits will improve Outcome: Progressing   Problem: Safety: Goal: Periods of time without injury will increase Outcome: Progressing   Problem: Education: Goal: Ability to state activities that reduce stress will improve Outcome: Progressing   Problem: Education: Goal: Utilization of techniques to improve thought processes will improve Outcome: Progressing   "

## 2024-02-16 NOTE — Plan of Care (Signed)
  Problem: Education: Goal: Ability to state activities that reduce stress will improve Outcome: Progressing   Problem: Education: Goal: Ability to state activities that reduce stress will improve Outcome: Progressing

## 2024-02-16 NOTE — Group Note (Signed)
 Date:  02/16/2024 Time:  5:01 PM  Group Topic/Focus:  Coping With Mental Health Crisis:   The purpose of this group is to help patients identify strategies for coping with mental health crisis.  Group discusses possible causes of crisis and ways to manage them effectively. Healthy Communication:   The focus of this group is to discuss communication, barriers to communication, as well as healthy ways to communicate with others. Making Healthy Choices:   The focus of this group is to help patients identify negative/unhealthy choices they were using prior to admission and identify positive/healthier coping strategies to replace them upon discharge.  A group was facilitated focusing on fun, critical-thinking activities. Patients participated in games such as Family Feud and What Would You Do? scenarios. The group encouraged patients to identify positive aspects of situations while engaging in problem-solving and decision-making skills. Patients were given opportunities to share ideas, collaborate, and practice healthy coping strategies in a supportive environment.  Participation Level:  Active  Participation Quality:  Appropriate  Affect:  Appropriate  Cognitive:  Appropriate  Insight: Appropriate and Good  Engagement in Group:  Engaged  Modes of Intervention:  Activity and Discussion  Additional Comments:    Amaiya Scruton L Melodye Swor 02/16/2024, 5:01 PM

## 2024-02-16 NOTE — Evaluation (Signed)
 Physical Therapy Evaluation Patient Details Name: Karen [name] MRN: [id number] DOB: 06-26-1955 Today's Date: 02/16/2024  History of Present Illness  69 year old female with a significant psychiatric history including major depressive disorder, bipolar disorder, PTSD, and anxiety, as well as multiple complex medical comorbidities, who presents to the emergency department after an episode of self-inflicted cutting to the left wrist.  Clinical Impression  Pt pleasant and willing to work with PT.  She showed good overall confidence and stability with basic and moderate balance challenges.  She scored 53/56 on the [facility] Scale.  Pt displayed confidence with ambulation w/o AD showing symmetrical gait, community appropriate speed and no overt LOBs or unsteadiness.  Pt reports she feels close to her baseline regarding mobility, etc and agrees that she does not need further PT intervention.  Of note, she reports chronic vertigo issues but was too anxious in this setting to allow PT to do BPPV testing/Eply Maneuver - PT did discuss ENT vs vestib PT options.      If plan is discharge home, recommend the following: Assist for transportation   Can travel by private vehicle        Equipment Recommendations None recommended by PT  Recommendations for Other Services       Functional Status Assessment Patient has not had a recent decline in their functional status     Precautions / Restrictions Precautions Precautions: Fall Restrictions Weight Bearing Restrictions Per Provider Order: No      Mobility  Bed Mobility Overal bed mobility: Independent                  Transfers Overall transfer level: Independent Equipment used: None               General transfer comment: easily up/down w/o AD - no safety issues    Ambulation/Gait Ambulation/Gait assistance: Modified independent (Device/Increase time) Gait Distance (Feet): 250 Feet Assistive device: None, Rolling  walker (2 wheels)         General Gait Details: Pt was able to ambulate with and w/o RW, though displays no LOBs, unsteadiness, etc w/o AD and maintained community appropriate speed, etc w/o issue  Careers Information Officer     Tilt Bed    Modified Rankin (Stroke Patients Only)       Balance Overall balance assessment: Modified Independent                               Standardized Balance Assessment Standardized Balance Assessment : Berg Balance Test Berg Balance Test Sit to Stand: Able to stand without using hands and stabilize independently Standing Unsupported: Able to stand safely 2 minutes Sitting with Back Unsupported but Feet Supported on Floor or Stool: Able to sit safely and securely 2 minutes Stand to Sit: Sits safely with minimal use of hands Transfers: Able to transfer safely, minor use of hands Standing Unsupported with Eyes Closed: Able to stand 10 seconds safely Standing Ubsupported with Feet Together: Able to place feet together independently and stand 1 minute safely From Standing, Reach Forward with Outstretched Arm: Can reach forward >12 cm safely (5) From Standing Position, Pick up Object from Floor: Able to pick up shoe safely and easily From Standing Position, Turn to Look Behind Over each Shoulder: Looks behind from both sides and weight shifts well Turn 360 Degrees: Able to turn 360 degrees safely in 4 seconds  or less Standing Unsupported, Alternately Place Feet on Step/Stool: Able to stand independently and safely and complete 8 steps in 20 seconds Standing Unsupported, One Foot in Front: Able to plae foot ahead of the other independently and hold 30 seconds Standing on One Leg: Able to lift leg independently and hold 5-10 seconds Total Score: 53         Pertinent Vitals/Pain Pain Assessment Pain Assessment: No/denies pain    Home Living Family/patient expects to be discharged to:: Private residence Living  Arrangements: Alone Available Help at Discharge: Available PRN/intermittently Type of Home: House Home Access: Stairs to enter   Secretary/administrator of Steps: 3   Home Layout: Laundry or work area in basement (flight of stairs to basement) Home Equipment: None      Prior Function Prior Level of Function : Independent/Modified Independent             Mobility Comments: Pt does not need/use AD, stopped driving >1 year ago but gets Database Administrator or uses Pharmacist, Community, does report 1 fall in the last 6 months (I don't know what happened, just blacked out and went down) ADLs Comments: reports independence but sometimes issues with the laundry down stairs     Extremity/Trunk Assessment   Upper Extremity Assessment Upper Extremity Assessment: Overall WFL for tasks assessed    Lower Extremity Assessment Lower Extremity Assessment: Overall WFL for tasks assessed       Communication   Communication Communication: No apparent difficulties    Cognition Arousal: Alert Behavior During Therapy: WFL for tasks assessed/performed   PT - Cognitive impairments: No apparent impairments                       PT - Cognition Comments: Pt very anxious about possibility of testing for BPPV 2/2 h/o vertigo - defers at this time Following commands: Intact       Cueing       General Comments General comments (skin integrity, edema, etc.): Pt reported some chronic vertigo issues when laying on the L, PT suggested BPPV testing and possible Eply maneuver - pt too anxious about possibly feeling nauseated and pleasantly refused    Exercises     Assessment/Plan    PT Assessment Patient does not need any further PT services  PT Problem List         PT Treatment Interventions      PT Goals (Current goals can be found in the Care Plan section)  Acute Rehab PT Goals Patient Stated Goal: no specific goals - wants to go home - doesn't want to stress daughter PT Goal Formulation: All  assessment and education complete, DC therapy    Frequency       Co-evaluation               AM-PAC PT 6 Clicks Mobility  Outcome Measure Help needed turning from your back to your side while in a flat bed without using bedrails?: None Help needed moving from lying on your back to sitting on the side of a flat bed without using bedrails?: None Help needed moving to and from a bed to a chair (including a wheelchair)?: None Help needed standing up from a chair using your arms (e.g., wheelchair or bedside chair)?: None Help needed to walk in hospital room?: None Help needed climbing 3-5 steps with a railing? : None 6 Click Score: 24    End of Session   Activity Tolerance: Patient tolerated treatment well Patient left:  in bed Nurse Communication: Mobility status PT Visit Diagnosis: Unsteadiness on feet (R26.81)    Time: 8846-8781 PT Time Calculation (min) (ACUTE ONLY): 25 min   Charges:   PT Evaluation $PT Eval Low Complexity: 1 Low   PT General Charges $$ ACUTE PT VISIT: 1 Visit         Carmin JONELLE Deed, DPT 02/16/2024, 2:24 PM

## 2024-02-16 NOTE — Progress Notes (Signed)
" °   02/16/24 1927  Psych Admission Type (Psych Patients Only)  Admission Status Voluntary  Psychosocial Assessment  Patient Complaints None  Eye Contact Fair  Facial Expression Flat  Affect Appropriate to circumstance  Speech Logical/coherent  Interaction Minimal  Motor Activity Slow  Appearance/Hygiene In scrubs;Unremarkable  Behavior Characteristics Cooperative  Mood Pleasant  Thought Process  Coherency WDL  Content WDL  Delusions None reported or observed  Perception WDL  Hallucination None reported or observed  Judgment Impaired  Confusion Mild  Danger to Self  Current suicidal ideation? Denies  Self-Injurious Behavior No self-injurious ideation or behavior indicators observed or expressed   Agreement Not to Harm Self Yes  Description of Agreement verbal  Danger to Others  Danger to Others None reported or observed   Mood/Behavior:  Calm and cooperative. Mild confusion.   Psych assessment: Denies SI/HI and AVH.     Interaction / Group attendance:  Out for a visit. Minimal interaction with peers and staff. Isolative did not attend group.    Medication/ PRNs: Compliant with scheduled medications. No prns required.   Pain: Denies   Wound care completed.   15 min checks in place for safety "

## 2024-02-16 NOTE — Evaluation (Signed)
 Occupational Therapy Evaluation Patient Details Name: Karen [name] MRN: [id number] DOB: [birth year] Today's Date: 02/16/2024   History of Present Illness   69 year old female with a significant psychiatric history including major depressive disorder, bipolar disorder, PTSD, and anxiety, as well as multiple complex medical comorbidities, who presents to the emergency department after an episode of self-inflicted cutting to the left wrist.     Clinical Impressions Upon entering the room, pt supine in bed and agreeable to OT evaluation. Pt reports living at home alone and being Ind at baseline with mobility and ADLs. She does not drive but uses uber or has things delivered. Pt has mild cognitive deficits at baseline per chart review and shunt placed. Pt given SLUMS during this assessment.   Pt completed SLUMS examination this date scoring 16/30 indicating a positive screen for dementia. Of note, it is not within occupational therapy scope of practice to diagnose cognitive impairments, this screen indicates need for further testing. The SLUMS is a 30 point, 11 question screening questionnaire that tests orientation, memory, attention, and executive function. Pt with noted impairments in short term memory, problem solving, and executive function.   OT attempting to discuss if pt has anything in place already to address modifications she may need at home secondary to cognitive deficits. She does use a pillbox but is interested in blister packs for medications. Pt will likely need more assistance/supervision moving forward if returning home for safety with higher level tasks based on score from this assessment.   Pt demonstrates ability to ambulate in room without use of AD without LOB or needing assistance. Pt does not need skilled acute OT intervention at this time. OT to complete orders.      If plan is discharge home, recommend the following:   Supervision due to cognitive status      Functional Status Assessment   Patient has not had a recent decline in their functional status     Equipment Recommendations   None recommended by OT      Precautions/Restrictions   Precautions Precautions: Fall     Mobility Bed Mobility Overal bed mobility: Independent                  Transfers Overall transfer level: Independent Equipment used: None                      Balance Overall balance assessment: Modified Independent                                         ADL either performed or assessed with clinical judgement   ADL Overall ADL's : Modified independent                                             Vision Patient Visual Report: No change from baseline              Pertinent Vitals/Pain Pain Assessment Pain Assessment: No/denies pain     Extremity/Trunk Assessment Upper Extremity Assessment Upper Extremity Assessment: Overall WFL for tasks assessed   Lower Extremity Assessment Lower Extremity Assessment: Overall WFL for tasks assessed       Communication Communication Communication: No apparent difficulties   Cognition Arousal: Alert Behavior During Therapy: WFL for tasks assessed/performed Cognition:  History of cognitive impairments             OT - Cognition Comments: 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 per chart                 Following commands: Intact       Cueing  General Comments   Cueing Techniques: Verbal cues  Pt reported some chronic vertigo issues when laying on the L, PT suggested BPPV testing and possible Eply maneuver - pt too anxious about possibly feeling nauseated and pleasantly refused           Home Living Family/patient expects to be discharged to:: Private residence Living Arrangements: Alone Available Help at Discharge: Available PRN/intermittently Type of Home: House Home Access: Stairs to enter Water Quality Scientist of Steps: 3   Home Layout: Laundry or work area in [employer]: None          Prior Functioning/Environment Prior Level of Function : Independent/Modified Independent             Mobility Comments: Pt does not need/use AD, stopped driving >1 year ago but gets [employer] or uses Pharmacist, Community, does report 1 fall in the last 6 months (I don't know what happened, just blacked out and went down) ADLs Comments: reports independence but sometimes issues with the laundry down stairs. She uses pillbox for medications.     AM-PAC OT 6 Clicks Daily Activity     Outcome Measure Help from another person eating meals?: None Help from another person taking care of personal grooming?: None Help from another person toileting, which includes using toliet, bedpan, or urinal?: None Help from another person bathing (including washing, rinsing, drying)?: None Help from another person to put on and taking off regular upper body clothing?: None Help from another person to put on and taking off regular lower body clothing?: None 6 Click Score: 24   End of Session Nurse Communication: Mobility status  Activity Tolerance: Patient tolerated treatment well Patient left: in bed                   Time: 1316-1347 OT Time Calculation (min): 31 min Charges:  OT General Charges $OT Visit: 1 Visit OT Evaluation $OT Eval Low Complexity: 1 Low OT Treatments $Therapeutic Activity: 8-22 mins  Izetta Claude, MS, OTR/L , CBIS ascom [phone]  02/16/2024, 5:03 PM

## 2024-02-16 NOTE — Group Note (Signed)
 Date:  02/16/2024 Time:  9:58 AM  Group Topic/Focus:  Coping With Mental Health Crisis:   The purpose of this group is to help patients identify strategies for coping with mental health crisis.  Group discusses possible causes of crisis and ways to manage them effectively.  Two motivational and supportive meditation videos were provided to promote relaxation and rejuvenation of the mind, body, and spirit. Patients were guided to reflect on their experiences from 2025 and encouraged to make intentional, healthy decisions for entering 2026 on a positive and balanced path. Time was allotted for guided breathing exercises, allowing patients to focus on deep inhalation and exhalation while engaging in personal reflection.  Participation Level:  Active  Participation Quality:  Appropriate  Affect:  Appropriate  Cognitive:  Appropriate  Insight: Appropriate  Engagement in Group:  Engaged  Modes of Intervention:  Activity and Discussion  Additional Comments:    Laronda Lisby L Netanya Yazdani 02/16/2024, 9:58 AM

## 2024-02-17 DIAGNOSIS — F329 Major depressive disorder, single episode, unspecified: Secondary | ICD-10-CM | POA: Diagnosis not present

## 2024-02-17 NOTE — Group Note (Signed)
 Date:  02/17/2024 Time:  3:48 PM  Group Topic/Focus:  Movement Therapy, Getting fit with Zebadiah Willert.    Participation Level:  Active  Participation Quality:  Appropriate  Affect:  Appropriate  Cognitive:  Appropriate  Insight: Appropriate  Engagement in Group:  Engaged  Modes of Intervention:  Activity  Additional Comments:  none  Norleen SHAUNNA Bias 02/17/2024, 3:48 PM

## 2024-02-17 NOTE — [facility] Group Notes (Signed)
 Spirituality Group   Description: Participant directed exploration of values, beliefs and meaning   **Focus on how we generate hope and sit with uncertainty   Following a brief framework of chaplains role and ground rules of group behavior, participants are invited to share concerns or questions that engage spiritual life. Emphasis placed on common themes and shared experiences and ways to make meaning and clarify living into ones values.   Theory/Process/Goal: Utilize the theoretical framework of group therapy established by Celena Kite, Relational Cultural Theory and Rogerian approaches to facilitate relational empathy and use of the here and now to foster reflection, self-awareness, and sharing.   Observations: Karen [name] was reserved but passive participation seemed evident through eye contact and body language.  [name] L. [name] HERO.Div

## 2024-02-17 NOTE — Plan of Care (Signed)
   Problem: Coping: Goal: Ability to demonstrate self-control will improve Outcome: Progressing   Problem: Health Behavior/Discharge Planning: Goal: Compliance with treatment plan for underlying cause of condition will improve Outcome: Progressing

## 2024-02-17 NOTE — Group Note (Signed)
 Date:  02/17/2024 Time:  8:53 PM  Group Topic/Focus:  Wrap-Up Group:   The focus of this group is to help patients review their daily goal of treatment and discuss progress on daily workbooks.    Participation Level:  Did Not Attend  Karen [name] 02/17/2024, 8:53 PM

## 2024-02-17 NOTE — Progress Notes (Signed)
 [facility] MD Progress Note  02/17/2024 12:28 PM Karen [name]  MRN:  [id number]  Karen [name] is a 69 year old female with a significant psychiatric history including major depressive disorder, bipolar disorder, PTSD, and anxiety, as well as multiple complex medical comorbidities, who presents to the emergency department after an episode of self-inflicted cutting to the left wrist.  Subjective:  Chart reviewed, case discussed in multidisciplinary meeting, patient seen during rounds.   Patient is noted to be resting in bed.  She offers no complaints.  Patient reports taking Latuda  in the morning and reported no side effects.  She denies SI/HI/plan and denies hallucinations.  Patient and provider discussed about safe discharge planning including PT OT eval's giving further recommendations about appropriate help like home health aide that she needs when she goes home.  Past Psychiatric History: see h&P Family History:  Family History  Problem Relation Age of Onset   Breast cancer Neg Hx    Social History:  Social History   Substance and Sexual Activity  Alcohol Use Not Currently   Comment: occ. wine maybe 1  month     Social History   Substance and Sexual Activity  Drug Use No    Social History   Socioeconomic History   Marital status: Divorced    Spouse name: Not on file   Number of children: Not on file   Years of education: Not on file   Highest education level: Not on file  Occupational History   Not on file  Tobacco Use   Smoking status: Former    Current packs/day: 0.00    Average packs/day: 0.5 packs/day for 48.0 years (24.0 ttl pk-yrs)    Types: Cigarettes    Start date: 02/09/1962    Quit date: 02/09/2010    Years since quitting: 14.0   Smokeless tobacco: Never  Vaping Use   Vaping status: Never Used  Substance and Sexual Activity   Alcohol use: Not Currently    Comment: occ. wine maybe 1  month   Drug use: No   Sexual activity: Not on file  Other Topics  Concern   Not on file  Social History Narrative   Not on file   Social Drivers of Health   Tobacco Use: Medium Risk (02/09/2024)   Patient History    Smoking Tobacco Use: Former    Smokeless Tobacco Use: Never    Passive Exposure: Not on file  Financial Resource Strain: Patient Declined (10/15/2023)   Received from [facility]   Overall Financial Resource Strain (CARDIA)    How hard is it for you to pay for the very basics like food, housing, medical care, and heating?: Patient declined  Recent Concern: Financial Resource Strain - Medium Risk (09/23/2023)   Received from [facility]   Overall Financial Resource Strain (CARDIA)    Difficulty of Paying Living Expenses: Somewhat hard  Food Insecurity: No Food Insecurity (02/09/2024)   Epic    Worried About Running Out of Food in the Last Year: Never true    Ran Out of Food in the Last Year: Never true  Transportation Needs: No Transportation Needs (02/09/2024)   Epic    Lack of Transportation (Medical): No    Lack of Transportation (Non-Medical): No  Physical Activity: Not on file  Stress: Not on file  Social Connections: Socially Isolated (02/09/2024)   Social Connection and Isolation Panel    Frequency of Communication with Friends and Family: Three times a week    Frequency  of Social Gatherings with Friends and Family: Three times a week    Attends Religious Services: Never    Active Member of Clubs or Organizations: No    Attends Banker Meetings: Never    Marital Status: Divorced  Depression (PHQ2-9): Not on file  Alcohol Screen: Low Risk (02/09/2024)   Alcohol Screen    Last Alcohol Screening Score (AUDIT): 0  Housing: Low Risk (02/09/2024)   Epic    Unable to Pay for Housing in the Last Year: No    Number of Times Moved in the Last Year: 0    Homeless in the Last Year: No  Utilities: Not At Risk (02/09/2024)   Epic    Threatened with loss of utilities: No  Health Literacy: Not on  file   Past Medical History:  Past Medical History:  Diagnosis Date   Anemia    Anxiety    Arthritis    Breast cancer (HCC) 1995   left breast cancer - chemo and modified mastectomy   Depression    Dyspnea    Dysrhythmia    Essential thrombocytosis (HCC)    GERD (gastroesophageal reflux disease)    Heart murmur    History of hiatal hernia    Hyperlipidemia    Hyperthyroidism    Mitral valve prolapse 1997   Normal pressure hydrocephalus (HCC) 2022   shunted   Osteoporosis    Personal history of chemotherapy 1995   BREAST CA   Personal history of radiation therapy 1995   BREAST CA   Sjogren's syndrome    Vitamin D deficiency     Past Surgical History:  Procedure Laterality Date   AUGMENTATION MAMMAPLASTY Bilateral 1995   BREAST LUMPECTOMY Left 1995   COLONOSCOPY N/A 11/16/2023   Procedure: COLONOSCOPY;  Surgeon: Maryruth Ole DASEN, MD;  Location: [facility] ENDOSCOPY;  Service: Endoscopy;  Laterality: N/A;   COLONOSCOPY WITH PROPOFOL  N/A 07/16/2017   Procedure: COLONOSCOPY WITH PROPOFOL ;  Surgeon: Viktoria Lamar DASEN, MD;  Location: [facility] ENDOSCOPY;  Service: Endoscopy;  Laterality: N/A;   ESOPHAGOGASTRODUODENOSCOPY N/A 11/16/2023   Procedure: EGD (ESOPHAGOGASTRODUODENOSCOPY);  Surgeon: Maryruth Ole DASEN, MD;  Location: [facility] ENDOSCOPY;  Service: Endoscopy;  Laterality: N/A;   MASTECTOMY Left 1995   Tram flap W/IMPLANT   TONSILLECTOMY      Current Medications: Current Facility-Administered Medications  Medication Dose Route Frequency Provider Last Rate Last Admin   acetaminophen  (TYLENOL ) tablet 650 mg  650 mg Oral Q6H PRN Smith, Annie B, NP   650 mg at 02/10/24 1833   alum & mag hydroxide-simeth (MAALOX/MYLANTA) 200-200-20 MG/5ML suspension 30 mL  30 mL Oral Q4H PRN Smith, Annie B, NP       atorvastatin  (LIPITOR ) tablet 80 mg  80 mg Oral Daily Smith, Annie B, NP   80 mg at 02/17/24 9090   collagenase  (SANTYL ) ointment   Topical Daily Kaylynne Andres, MD   Given at 02/16/24 [id number]    feeding supplement (ENSURE PLUS HIGH PROTEIN) liquid 237 mL  237 mL Oral BID BM Madaram, Kondal R, MD   237 mL at 02/16/24 1439   lurasidone  (LATUDA ) tablet 20 mg  20 mg Oral Q breakfast Yasser Hepp, MD   20 mg at 02/17/24 0850   magnesium  hydroxide (MILK OF MAGNESIA) suspension 30 mL  30 mL Oral Daily PRN Smith, Annie B, NP       mirtazapine  (REMERON ) tablet 15 mg  15 mg Oral QHS Madaram, Kondal R, MD   15 mg at 02/16/24 2148  multivitamin with minerals tablet 1 tablet  1 tablet Oral Daily Claudene Sham B, NP   1 tablet at 02/17/24 9090   OLANZapine  (ZYPREXA ) injection 5 mg  5 mg Intramuscular TID PRN Smith, Annie B, NP       OLANZapine  zydis (ZYPREXA ) disintegrating tablet 5 mg  5 mg Oral TID PRN Smith, Annie B, NP       QUEtiapine  (SEROQUEL ) tablet 25 mg  25 mg Oral BID Madaram, Kondal R, MD   25 mg at 02/17/24 0908   raloxifene  (EVISTA ) tablet 60 mg  60 mg Oral Daily Smith, Annie B, NP   60 mg at 02/17/24 9090   thiamine  (VITAMIN B1) tablet 100 mg  100 mg Oral Daily Madaram, Kondal R, MD   100 mg at 02/17/24 9090   traZODone  (DESYREL ) tablet 150 mg  150 mg Oral QHS Smith, Annie B, NP   150 mg at 02/16/24 2148    Lab Results: No results found for this or any previous visit (from the past 48 hours).  Blood Alcohol level:  Lab Results  Component Value Date   [facility] <15 02/08/2024    Metabolic Disorder Labs: Lab Results  Component Value Date   HGBA1C 5.6 02/10/2024   MPG 114.02 02/10/2024   No results found for: PROLACTIN Lab Results  Component Value Date   CHOL 143 02/10/2024   TRIG 74 02/10/2024   HDL 59 02/10/2024   CHOLHDL 2.4 02/10/2024   VLDL 15 02/10/2024   LDLCALC 69 02/10/2024    Physical Findings: AIMS:  , ,  ,  ,    CIWA:    COWS:       02/16/2024   12:00 PM  Montreal Cognitive Assessment   Visuospatial/ Executive (0/5) 4  Naming (0/3) 3  Attention: Read list of digits (0/2) 2  Attention: Read list of letters (0/1) 1  Attention: Serial 7 subtraction  starting at 100 (0/3) 1  Language: Repeat phrase (0/2) 2  Language : Fluency (0/1) 1  Abstraction (0/2) 2  Delayed Recall (0/5) 0  Orientation (0/6) 5  Total 21  Adjusted Score (based on education) 21     Psychiatric Specialty Exam:  Presentation  General Appearance: Appropriate for Environment  Eye Contact:Fair  Speech:Slow  Speech Volume:Decreased    Mood and Affect  Mood:Anxious; Depressed  Affect:Constricted   Thought Process  Thought Processes:Irrevelant  Orientation:Partial  Thought Content:Illogical  Hallucinations:No data recorded  Ideas of Reference:None  Suicidal Thoughts:No data recorded  Homicidal Thoughts:No data recorded   Sensorium  Memory:Immediate Fair; Remote Fair  Judgment:Impaired  Insight:Shallow   Executive Functions  Concentration:Fair  Attention Span:Fair  Recall:Fair  Fund of Knowledge:Fair  Language:Fair   Psychomotor Activity  Psychomotor Activity:No data recorded  Musculoskeletal: Strength & Muscle Tone: within normal limits Gait & Station: normal Assets  Assets:Communication Skills; Social Support    Physical Exam: Physical Exam Vitals and nursing note reviewed.    ROS Blood pressure 123/72, pulse 91, temperature 98.3 F (36.8 C), resp. rate 18, height 5' 5 (1.651 m), weight 43.8 kg, SpO2 100%. Body mass index is 16.06 kg/m.  Diagnosis: Principal Problem:   MDD (major depressive disorder)   PLAN: Safety and Monitoring:  -- Voluntary admission to inpatient psychiatric unit for safety, stabilization and treatment  -- Daily contact with patient to assess and evaluate symptoms and progress in treatment  -- Patient's case to be discussed in multi-disciplinary team meeting  -- Observation Level : q15 minute checks  -- Vital signs:  q12 hours  -- Precautions: suicide, elopement, and assault -- Encouraged patient to participate in unit milieu and in scheduled group therapies  2. Psychiatric  Treatment:  Scheduled Medications:  Seroquel  25 mg twice daily Remeron  15 mg nightly Trazodone  150 mg nightly Latuda  changed to 20 mg every morning   -- The risks/benefits/side-effects/alternatives to this medication were discussed in detail with the patient and time was given for questions. The patient consents to medication trial.  3. Medical Issues Being Addressed:     4. Discharge Planning:   -- Social work and case management to assist with discharge planning and identification of hospital follow-up needs prior to discharge  -- Estimated LOS: 3-4 days  Hatice Bubel, MD 02/17/2024, 12:28 PM

## 2024-02-17 NOTE — Plan of Care (Signed)
" °  Problem: Education: Goal: Knowledge of [place] [facility] Education information/materials will improve 02/17/2024 2134 by Margrette Handler, RN Outcome: Progressing 02/17/2024 2134 by Margrette Handler, RN Outcome: Not Progressing Goal: Emotional status will improve 02/17/2024 2134 by Margrette Handler, RN Outcome: Progressing 02/17/2024 2134 by Margrette Handler, RN Outcome: Not Progressing Goal: Mental status will improve 02/17/2024 2134 by Margrette Handler, RN Outcome: Progressing 02/17/2024 2134 by Margrette Handler, RN Outcome: Not Progressing Goal: Verbalization of understanding the information provided will improve 02/17/2024 2134 by Margrette Handler, RN Outcome: Progressing 02/17/2024 2134 by Margrette Handler, RN Outcome: Not Progressing   Problem: Activity: Goal: Interest or engagement in activities will improve 02/17/2024 2134 by Margrette Handler, RN Outcome: Progressing 02/17/2024 2134 by Margrette Handler, RN Outcome: Not Progressing Goal: Sleeping patterns will improve 02/17/2024 2134 by Margrette Handler, RN Outcome: Progressing 02/17/2024 2134 by Margrette Handler, RN Outcome: Not Progressing   "

## 2024-02-17 NOTE — Group Note (Signed)
 Date:  02/17/2024 Time:  10:54 AM  Group Topic/Focus:  Rediscovering Joy:   The focus of this group is to explore various ways to relieve stress in a positive manner.    Participation Level:  Did Not Attend   Camellia HERO Crystel Demarco 02/17/2024, 10:54 AM

## 2024-02-17 NOTE — Group Note (Signed)
 LCSW Group Therapy Note  Group Date: 02/17/2024 Start Time: 1325 End Time: 1340   Type of Therapy and Topic:  Group Therapy - How To Cope with Nervousness about Discharge   Participation Level:  Minimal   Description of Group This process group involved identification of patients' feelings about discharge. Some of them are scheduled to be discharged soon, while others are new admissions, but each of them was asked to share thoughts and feelings surrounding discharge from the hospital. One common theme was that they are excited at the prospect of going home, while another was that many of them are apprehensive about sharing why they were hospitalized. Patients were given the opportunity to discuss these feelings with their peers in preparation for discharge.  Therapeutic Goals  Patient will identify their overall feelings about pending discharge. Patient will think about how they might proactively address issues that they believe will once again arise once they get home (i.e. with parents). Patients will participate in discussion about having hope for change.   Summary of Patient Progress:  Karen [name] was very active throughout the session. Karen [name] demonstrated fair insight into the subject matter, and proved open to input from peers and feedback from CSW. Karen [name] was respectful of peers and participated throughout the entire session.   Therapeutic Modalities Cognitive Behavioral Therapy   [name], LCSWA 02/17/2024  2:41 PM

## 2024-02-17 NOTE — Group Note (Signed)
 Recreation Therapy Group Note   Group Topic:Leisure Education  Group Date: 02/17/2024 Start Time: 1415 End Time: 1500 Facilitators: Celestia Jeoffrey BRAVO, LRT, CTRS Location: Courtyard  Group Description: Outdoor Recreation. Patients had the option to play corn hole, ring toss, UNO, or listening to music while outside in the courtyard getting fresh air and sunlight.  LRT and patients discussed things that they enjoy doing in their free time outside of the hospital. LRT encouraged patients to drink water after being active and getting their heart rate up.   Goal Area(s) Addressed: Patient will identify leisure interests.  Patient will practice healthy decision making. Patient will engage in recreation activity.   Affect/Mood: Appropriate and Flat   Participation Level: Minimal    Clinical Observations/Individualized Feedback: Karen [name] was present for the first 10 minutes. Pt went back inside and did not return to group.   Plan: Continue to engage patient in RT group sessions 2-3x/week.   Jeoffrey BRAVO Celestia, LRT, CTRS 02/17/2024 4:42 PM

## 2024-02-18 DIAGNOSIS — F329 Major depressive disorder, single episode, unspecified: Secondary | ICD-10-CM | POA: Diagnosis not present

## 2024-02-18 NOTE — B[facility] Counselor (Signed)
 CSW contacted pt's daughter Recardo). Pt's daughter added her other daughter Teddi) into the call.   Provider gave clinical updates to pt's family.   CSW and provider confirmed discharge for Monday.    Lum Croft, MSW, [place] 02/18/2024 4:04 PM

## 2024-02-18 NOTE — Group Note (Signed)
 Date:  02/18/2024 Time:  10:40 AM  Group Topic/Focus:  Coping With Mental Health Crisis:   The purpose of this group is to help patients identify strategies for coping with mental health crisis.  Group discusses possible causes of crisis and ways to manage them effectively.    Participation Level:  Active  Participation Quality:  Appropriate  Affect:  Appropriate  Cognitive:  Appropriate  Insight: Appropriate  Engagement in Group:  Engaged  Modes of Intervention:  Education  Additional Comments:  none  Norleen SHAUNNA Bias 02/18/2024, 10:40 AM

## 2024-02-18 NOTE — Progress Notes (Signed)
 [facility] MD Progress Note  02/18/2024 1:43 PM Karen [name]  MRN:  [id number]  Karen [name] is a 69 year old female with a significant psychiatric history including major depressive disorder, bipolar disorder, PTSD, and anxiety, as well as multiple complex medical comorbidities, who presents to the emergency department after an episode of self-inflicted cutting to the left wrist.  Subjective:  Chart reviewed, case discussed in multidisciplinary meeting, patient seen during rounds.   Patient is noted to be sitting in the day area.  She offers no complaints.  She reports having sleep disturbances due to the environmental stimuli and noises on the unit at night.  She denies SI/HI/plan and denies hallucinations.  Patient was updated about the family phone call with the provider and social worker.  Provider team and family discussed about the PT OT recommendations of home health.  Social worker has reached out to her primary care doctor and already relayed the request for referral for home health aide.  Patient denies SI/HI/plan and denies hallucinations.  Patient reports tolerating Latuda  fairly well with no reported side effects  Past Psychiatric History: see h&P Family History:  Family History  Problem Relation Age of Onset   Breast cancer Neg Hx    Social History:  Social History   Substance and Sexual Activity  Alcohol Use Not Currently   Comment: occ. wine maybe 1  month     Social History   Substance and Sexual Activity  Drug Use No    Social History   Socioeconomic History   Marital status: Divorced    Spouse name: Not on file   Number of children: Not on file   Years of education: Not on file   Highest education level: Not on file  Occupational History   Not on file  Tobacco Use   Smoking status: Former    Current packs/day: 0.00    Average packs/day: 0.5 packs/day for 48.0 years (24.0 ttl pk-yrs)    Types: Cigarettes    Start date: 02/09/1962    Quit date: 02/09/2010     Years since quitting: 14.0   Smokeless tobacco: Never  Vaping Use   Vaping status: Never Used  Substance and Sexual Activity   Alcohol use: Not Currently    Comment: occ. wine maybe 1  month   Drug use: No   Sexual activity: Not on file  Other Topics Concern   Not on file  Social History Narrative   Not on file   Social Drivers of Health   Tobacco Use: Medium Risk (02/09/2024)   Patient History    Smoking Tobacco Use: Former    Smokeless Tobacco Use: Never    Passive Exposure: Not on file  Financial Resource Strain: Patient Declined (10/15/2023)   Received from [facility]   Overall Financial Resource Strain (CARDIA)    How hard is it for you to pay for the very basics like food, housing, medical care, and heating?: Patient declined  Recent Concern: Financial Resource Strain - Medium Risk (09/23/2023)   Received from [facility]   Overall Financial Resource Strain (CARDIA)    Difficulty of Paying Living Expenses: Somewhat hard  Food Insecurity: No Food Insecurity (02/09/2024)   Epic    Worried About Running Out of Food in the Last Year: Never true    Ran Out of Food in the Last Year: Never true  Transportation Needs: No Transportation Needs (02/09/2024)   Epic    Lack of Transportation (Medical): No  Lack of Transportation (Non-Medical): No  Physical Activity: Not on file  Stress: Not on file  Social Connections: Socially Isolated (02/09/2024)   Social Connection and Isolation Panel    Frequency of Communication with Friends and Family: Three times a week    Frequency of Social Gatherings with Friends and Family: Three times a week    Attends Religious Services: Never    Active Member of Clubs or Organizations: No    Attends Banker Meetings: Never    Marital Status: Divorced  Depression (PHQ2-9): Not on file  Alcohol Screen: Low Risk (02/09/2024)   Alcohol Screen    Last Alcohol Screening Score (AUDIT): 0  Housing: Low Risk  (02/09/2024)   Epic    Unable to Pay for Housing in the Last Year: No    Number of Times Moved in the Last Year: 0    Homeless in the Last Year: No  Utilities: Not At Risk (02/09/2024)   Epic    Threatened with loss of utilities: No  Health Literacy: Not on file   Past Medical History:  Past Medical History:  Diagnosis Date   Anemia    Anxiety    Arthritis    Breast cancer (HCC) 1995   left breast cancer - chemo and modified mastectomy   Depression    Dyspnea    Dysrhythmia    Essential thrombocytosis (HCC)    GERD (gastroesophageal reflux disease)    Heart murmur    History of hiatal hernia    Hyperlipidemia    Hyperthyroidism    Mitral valve prolapse 1997   Normal pressure hydrocephalus (HCC) 2022   shunted   Osteoporosis    Personal history of chemotherapy 1995   BREAST CA   Personal history of radiation therapy 1995   BREAST CA   Sjogren's syndrome    Vitamin D deficiency     Past Surgical History:  Procedure Laterality Date   AUGMENTATION MAMMAPLASTY Bilateral 1995   BREAST LUMPECTOMY Left 1995   COLONOSCOPY N/A 11/16/2023   Procedure: COLONOSCOPY;  Surgeon: Maryruth Ole DASEN, MD;  Location: [facility] ENDOSCOPY;  Service: Endoscopy;  Laterality: N/A;   COLONOSCOPY WITH PROPOFOL  N/A 07/16/2017   Procedure: COLONOSCOPY WITH PROPOFOL ;  Surgeon: Viktoria Lamar DASEN, MD;  Location[facility] ENDOSCOPY;  Service: Endoscopy;  Laterality: N/A;   ESOPHAGOGASTRODUODENOSCOPY N/A 11/16/2023   Procedure: EGD (ESOPHAGOGASTRODUODENOSCOPY);  Surgeon: Maryruth Ole DASEN, MD;  Location: [facility] ENDOSCOPY;  Service: Endoscopy;  Laterality: N/A;   MASTECTOMY Left 1995   Tram flap W/IMPLANT   TONSILLECTOMY      Current Medications: Current Facility-Administered Medications  Medication Dose Route Frequency Provider Last Rate Last Admin   acetaminophen  (TYLENOL ) tablet 650 mg  650 mg Oral Q6H PRN Smith, Annie B, NP   650 mg at 02/10/24 1833   alum & mag hydroxide-simeth (MAALOX/MYLANTA)  200-200-20 MG/5ML suspension 30 mL  30 mL Oral Q4H PRN Smith, Annie B, NP       atorvastatin  (LIPITOR ) tablet 80 mg  80 mg Oral Daily Smith, Annie B, NP   80 mg at 02/18/24 9041   collagenase  (SANTYL ) ointment   Topical Daily Jannelly Bergren, MD   Given at 02/18/24 9392   feeding supplement (ENSURE PLUS HIGH PROTEIN) liquid 237 mL  237 mL Oral BID BM Madaram, Kondal R, MD   237 mL at 02/18/24 0958   lurasidone  (LATUDA ) tablet 20 mg  20 mg Oral Q breakfast Georgiann Neider, MD   20 mg at 02/18/24 727 [id number] 2384  magnesium  hydroxide (MILK OF MAGNESIA) suspension 30 mL  30 mL Oral Daily PRN Smith, Annie B, NP       mirtazapine  (REMERON ) tablet 15 mg  15 mg Oral QHS Madaram, Kondal R, MD   15 mg at 02/17/24 2055   multivitamin with minerals tablet 1 tablet  1 tablet Oral Daily Smith, Annie B, NP   1 tablet at 02/18/24 9041   OLANZapine  (ZYPREXA ) injection 5 mg  5 mg Intramuscular TID PRN Smith, Annie B, NP       OLANZapine  zydis (ZYPREXA ) disintegrating tablet 5 mg  5 mg Oral TID PRN Smith, Annie B, NP       QUEtiapine  (SEROQUEL ) tablet 25 mg  25 mg Oral BID Madaram, Kondal R, MD   25 mg at 02/18/24 9041   raloxifene  (EVISTA ) tablet 60 mg  60 mg Oral Daily Smith, Annie B, NP   60 mg at 02/18/24 9041   thiamine  (VITAMIN B1) tablet 100 mg  100 mg Oral Daily Madaram, Kondal R, MD   100 mg at 02/18/24 9041   traZODone  (DESYREL ) tablet 150 mg  150 mg Oral QHS Smith, Annie B, NP   150 mg at 02/17/24 2055    Lab Results: No results found for this or any previous visit (from the past 48 hours).  Blood Alcohol level:  Lab Results  Component Value Date   [facility] <15 02/08/2024    Metabolic Disorder Labs: Lab Results  Component Value Date   HGBA1C 5.6 02/10/2024   MPG 114.02 02/10/2024   No results found for: PROLACTIN Lab Results  Component Value Date   CHOL 143 02/10/2024   TRIG 74 02/10/2024   HDL 59 02/10/2024   CHOLHDL 2.4 02/10/2024   VLDL 15 02/10/2024   LDLCALC 69 02/10/2024    Physical  Findings: AIMS:  , ,  ,  ,    CIWA:    COWS:       02/16/2024   12:00 PM  Montreal Cognitive Assessment   Visuospatial/ Executive (0/5) 4  Naming (0/3) 3  Attention: Read list of digits (0/2) 2  Attention: Read list of letters (0/1) 1  Attention: Serial 7 subtraction starting at 100 (0/3) 1  Language: Repeat phrase (0/2) 2  Language : Fluency (0/1) 1  Abstraction (0/2) 2  Delayed Recall (0/5) 0  Orientation (0/6) 5  Total 21  Adjusted Score (based on education) 21     Psychiatric Specialty Exam:  Presentation  General Appearance: Appropriate for Environment  Eye Contact:Fair  Speech:Slow  Speech Volume:Decreased    Mood and Affect  Mood:Anxious; Depressed  Affect:Constricted   Thought Process  Thought Processes:Irrevelant  Orientation:Partial  Thought Content:Illogical  Hallucinations: Denies  Ideas of Reference:None  Suicidal Thoughts: Denies  Homicidal Thoughts: Denies   Sensorium  Memory:Immediate Fair; Remote Fair  Judgment:Impaired  Insight:Shallow   Executive Functions  Concentration:Fair  Attention Span:Fair  Recall:Fair  Fund of Knowledge:Fair  Language:Fair   Psychomotor Activity  Psychomotor Activity:No data recorded  Musculoskeletal: Strength & Muscle Tone: within normal limits Gait & Station: normal Assets  Assets:Communication Skills; Social Support    Physical Exam: Physical Exam Vitals and nursing note reviewed.    ROS Blood pressure (!) 107/54, pulse 88, temperature (!) 97 F (36.1 C), resp. rate 16, height 5' 5 (1.651 m), weight 43.8 kg, SpO2 100%. Body mass index is 16.06 kg/m.  Diagnosis: Principal Problem:   MDD (major depressive disorder)   PLAN: Safety and Monitoring:  -- Voluntary admission to  inpatient psychiatric unit for safety, stabilization and treatment  -- Daily contact with patient to assess and evaluate symptoms and progress in treatment  -- Patient's case to be discussed in  multi-disciplinary team meeting  -- Observation Level : q15 minute checks  -- Vital signs:  q12 hours  -- Precautions: suicide, elopement, and assault -- Encouraged patient to participate in unit milieu and in scheduled group therapies  2. Psychiatric Treatment:  Scheduled Medications:  Seroquel  25 mg twice daily Remeron  15 mg nightly Trazodone  150 mg nightly Latuda  changed to 20 mg every morning   -- The risks/benefits/side-effects/alternatives to this medication were discussed in detail with the patient and time was given for questions. The patient consents to medication trial.  3. Medical Issues Being Addressed:     4. Discharge Planning:   -- Social work and case management to assist with discharge planning and identification of hospital follow-up needs prior to discharge  -- Estimated LOS: 3-4 days  Zian Delair, MD 02/18/2024, 1:43 PM

## 2024-02-18 NOTE — Progress Notes (Signed)
 Patient accepted medications without issue. Denies SI/HI/AVH. Patient expresses some anxiety about upcoming discharge but says she is 'ready to go.' PRN X1 for headache. Dressing changed per request. Educated patient about wound healing and the need to leave undisturbed. Patient voiced understanding but remained fixed on dressing change. Patient seen in dayroom for meals and group. Ongoing monitoring continues.   02/18/24 1100  Psych Admission Type (Psych Patients Only)  Admission Status Voluntary  Psychosocial Assessment  Patient Complaints Sleep disturbance  Eye Contact Fair  Facial Expression Flat  Affect Appropriate to circumstance  Speech Logical/coherent  Interaction Minimal  Motor Activity Slow  Appearance/Hygiene Unremarkable  Behavior Characteristics Cooperative  Mood Pleasant;Depressed  Thought Process  Coherency WDL  Content WDL  Delusions None reported or observed  Perception WDL  Hallucination None reported or observed  Judgment Limited  Confusion Mild  Danger to Self  Current suicidal ideation? Denies  Self-Injurious Behavior No self-injurious ideation or behavior indicators observed or expressed   Agreement Not to Harm Self Yes  Description of Agreement verbal  Danger to Others  Danger to Others None reported or observed

## 2024-02-18 NOTE — Plan of Care (Signed)
   Problem: Education: Goal: Emotional status will improve Outcome: Not Progressing Goal: Mental status will improve Outcome: Not Progressing Goal: Verbalization of understanding the information provided will improve Outcome: Not Progressing   Problem: Activity: Goal: Interest or engagement in activities will improve Outcome: Not Progressing

## 2024-02-18 NOTE — Group Note (Signed)
 Physical/Occupational Therapy Group Note  Group Topic: UE Therex   Group Date: 02/18/2024 Start Time: 1305 End Time: 1335 Facilitators: Clive Warren CROME, OT   Group Description: Group instructed in series of upper extremities exercises, aimed to promote strength, flexibility, range of motion and functional endurance.  Patients provided cuing for proper mechanics and proper pace of exercise; exercises adjusted as necessary for individualized patient needs.  Patient also engaged in cognitive components throughout session, working to integrate attention to task, command following, turn-taking and appropriate social interaction throughout session.  Allowed to ask questions as appropriate, and encouraged to identify specific exercises that they could complete independently outside of group sessions.  Therapeutic Goal(s):  Demonstrate appropriate performance of upper extremity exercises to promote strength, flexibility, range of motion and functional endurance Identify 2-3 specific upper extremity exercises to complete as home exercise program outside of group session  Individual Participation: Pt attended full session, reporting at start that she has some L shoulder difficulties that make it difficult to complete bimanual tasks/ex. Pt declined for OT to assist to allow for increased adaptation of there ex completed using theraband. Pt agreeable to attempting a couple of the ex with just her RUE.    Participation Level: Moderate   Participation Quality: Minimal Cues   Behavior: Alert, Appropriate, Calm, and Cooperative   Speech/Thought Process: Focused   Affect/Mood: Appropriate   Insight: Fair   Judgement: Fair and Moderate   Modes of Intervention: Activity, Clarification, Discussion, Education, Exploration, Orientation, Problem-solving, Socialization, and Support  Patient Response to Interventions:  Attentive, Engaged, and Interested    Plan: Continue to engage patient in PT/OT groups 1 -  2x/week.   Johnell Landowski R., MPH, MS, OTR/L ascom [phone] 02/18/2024, 3:21 PM

## 2024-02-19 DIAGNOSIS — F332 Major depressive disorder, recurrent severe without psychotic features: Secondary | ICD-10-CM | POA: Diagnosis not present

## 2024-02-19 NOTE — Progress Notes (Signed)
 Patient pleasant during interaction. Present in dayroom for some of the day for meals and group. Denies SI/HI/AVH. Talking about pending discharge and concerns over wound care. She does not want her daughters to assist with wound care. PRN tylenol  x2 for general aches. Ongoing monitoring continues.   02/19/24 1000  Psych Admission Type (Psych Patients Only)  Admission Status Voluntary  Psychosocial Assessment  Patient Complaints Worrying  Eye Contact Fair  Facial Expression Flat  Affect Appropriate to circumstance  Speech Logical/coherent  Interaction Minimal  Motor Activity Slow  Appearance/Hygiene Unremarkable  Behavior Characteristics Cooperative  Mood Pleasant;Depressed  Thought Process  Coherency WDL  Content WDL  Delusions None reported or observed  Perception WDL  Hallucination None reported or observed  Judgment Limited  Confusion Mild  Danger to Self  Current suicidal ideation? Denies  Self-Injurious Behavior No self-injurious ideation or behavior indicators observed or expressed   Danger to Others  Danger to Others None reported or observed

## 2024-02-19 NOTE — Progress Notes (Signed)
" °   02/18/24 2015  Psych Admission Type (Psych Patients Only)  Admission Status Voluntary  Psychosocial Assessment  Patient Complaints Sleep disturbance  Eye Contact Fair  Facial Expression Flat  Affect Appropriate to circumstance  Speech Logical/coherent  Interaction Minimal  Motor Activity Slow  Appearance/Hygiene Unremarkable  Behavior Characteristics Cooperative;Appropriate to situation  Mood Pleasant;Depressed  Thought Process  Coherency WDL  Content WDL  Delusions None reported or observed  Perception WDL  Hallucination None reported or observed  Judgment Limited  Confusion Mild  Danger to Self  Current suicidal ideation? Denies  Self-Injurious Behavior No self-injurious ideation or behavior indicators observed or expressed   Agreement Not to Harm Self Yes  Description of Agreement verbal  Danger to Others  Danger to Others None reported or observed    "

## 2024-02-19 NOTE — BH IP Treatment Plan (Signed)
 Interdisciplinary Treatment and Diagnostic Plan Update  02/19/2024 Time of Session: 120pm Karen [name] MRN: [id number]  Principal Diagnosis: MDD (major depressive disorder)  Secondary Diagnoses: Principal Problem:   MDD (major depressive disorder)   Current Medications:  Current Facility-Administered Medications  Medication Dose Route Frequency Provider Last Rate Last Admin   acetaminophen  (TYLENOL ) tablet 650 mg  650 mg Oral Q6H PRN Smith, Annie B, NP   650 mg at 02/19/24 1037   alum & mag hydroxide-simeth (MAALOX/MYLANTA) 200-200-20 MG/5ML suspension 30 mL  30 mL Oral Q4H PRN Smith, Annie B, NP       atorvastatin  (LIPITOR ) tablet 80 mg  80 mg Oral Daily Smith, Annie B, NP   80 mg at 02/19/24 9085   collagenase  (SANTYL ) ointment   Topical Daily Jadapalle, Sree, MD   1 Application at 02/19/24 9365   feeding supplement (ENSURE PLUS HIGH PROTEIN) liquid 237 mL  237 mL Oral BID BM Madaram, Kondal R, MD   237 mL at 02/18/24 1406   lurasidone  (LATUDA ) tablet 20 mg  20 mg Oral Q breakfast Jadapalle, Sree, MD   20 mg at 02/19/24 0754   magnesium  hydroxide (MILK OF MAGNESIA) suspension 30 mL  30 mL Oral Daily PRN Smith, Annie B, NP       mirtazapine  (REMERON ) tablet 15 mg  15 mg Oral QHS Madaram, Kondal R, MD   15 mg at 02/18/24 2115   multivitamin with minerals tablet 1 tablet  1 tablet Oral Daily Smith, Annie B, NP   1 tablet at 02/19/24 9085   OLANZapine  (ZYPREXA ) injection 5 mg  5 mg Intramuscular TID PRN Smith, Annie B, NP       OLANZapine  zydis (ZYPREXA ) disintegrating tablet 5 mg  5 mg Oral TID PRN Smith, Annie B, NP       QUEtiapine  (SEROQUEL ) tablet 25 mg  25 mg Oral BID Madaram, Kondal R, MD   25 mg at 02/19/24 0913   raloxifene  (EVISTA ) tablet 60 mg  60 mg Oral Daily Smith, Annie B, NP   60 mg at 02/19/24 9085   thiamine  (VITAMIN B1) tablet 100 mg  100 mg Oral Daily Madaram, Kondal R, MD   100 mg at 02/19/24 0913   traZODone  (DESYREL ) tablet 150 mg  150 mg Oral QHS Smith, Annie  B, NP   150 mg at 02/18/24 2115   PTA Medications: Medications Prior to Admission  Medication Sig Dispense Refill Last Dose/Taking   aspirin EC 81 MG tablet Take 81 mg by mouth daily. (Patient not taking: Reported on 02/08/2024)      atorvastatin  (LIPITOR ) 80 MG tablet Take 80 mg by mouth daily.      docusate sodium (COLACE) 100 MG capsule Take 100 mg by mouth daily. (Patient not taking: Reported on 02/08/2024)      mirtazapine  (REMERON ) 7.5 MG tablet Take 7.5 mg by mouth at bedtime.      Multiple Vitamin (MULTIVITAMIN) tablet Take 1 tablet by mouth daily.      NON FORMULARY Take 1 mL by mouth daily.      omega-3 acid ethyl esters (LOVAZA) 1 g capsule Take 1 g by mouth daily. (Patient not taking: Reported on 02/08/2024)      omeprazole (PRILOSEC) 20 MG capsule Take 20 mg by mouth daily.      Probiotic Product (PROBIOTIC-10) CHEW Chew by mouth. (Patient not taking: Reported on 02/08/2024)      psyllium (METAMUCIL) 58.6 % packet Take 1 packet by mouth daily as needed.  (  Patient not taking: Reported on 02/08/2024)      QUEtiapine  (SEROQUEL ) 25 MG tablet Take 12.5 mg by mouth 2 (two) times daily as needed (anxiety/panic symptoms).      raloxifene  (EVISTA ) 60 MG tablet Take 60 mg by mouth daily.      traZODone  (DESYREL ) 150 MG tablet Take 150 mg by mouth at bedtime.       Patient Stressors:    Patient Strengths:    Treatment Modalities: Medication Management, Group therapy, Case management,  1 to 1 session with clinician, Psychoeducation, Recreational therapy.   Physician Treatment Plan for Primary Diagnosis: MDD (major depressive disorder) Long Term Goal(s):     Short Term Goals:    Medication Management: Evaluate patient's response, side effects, and tolerance of medication regimen.  Therapeutic Interventions: 1 to 1 sessions, Unit Group sessions and Medication administration.  Evaluation of Outcomes: Progressing  Physician Treatment Plan for Secondary Diagnosis: Principal  Problem:   MDD (major depressive disorder)  Long Term Goal(s):     Short Term Goals:       Medication Management: Evaluate patient's response, side effects, and tolerance of medication regimen.  Therapeutic Interventions: 1 to 1 sessions, Unit Group sessions and Medication administration.  Evaluation of Outcomes: Progressing   RN Treatment Plan for Primary Diagnosis: MDD (major depressive disorder) Long Term Goal(s): Knowledge of disease and therapeutic regimen to maintain health will improve  Short Term Goals: Ability to remain free from injury will improve, Ability to verbalize frustration and anger appropriately will improve, Ability to demonstrate self-control, Ability to participate in decision making will improve, Ability to verbalize feelings will improve, Ability to disclose and discuss suicidal ideas, Ability to identify and develop effective coping behaviors will improve, and Compliance with prescribed medications will improve  Medication Management: RN will administer medications as ordered by provider, will assess and evaluate patient's response and provide education to patient for prescribed medication. RN will report any adverse and/or side effects to prescribing provider.  Therapeutic Interventions: 1 on 1 counseling sessions, Psychoeducation, Medication administration, Evaluate responses to treatment, Monitor vital signs and CBGs as ordered, Perform/monitor CIWA, COWS, AIMS and Fall Risk screenings as ordered, Perform wound care treatments as ordered.  Evaluation of Outcomes: Progressing   LCSW Treatment Plan for Primary Diagnosis: MDD (major depressive disorder) Long Term Goal(s): Safe transition to appropriate next level of care at discharge, Engage patient in therapeutic group addressing interpersonal concerns.  Short Term Goals: Engage patient in aftercare planning with referrals and resources, Increase social support, Increase ability to appropriately verbalize  feelings, Increase emotional regulation, Facilitate acceptance of mental health diagnosis and concerns, Facilitate patient progression through stages of change regarding substance use diagnoses and concerns, Identify triggers associated with mental health/substance abuse issues, and Increase skills for wellness and recovery  Therapeutic Interventions: Assess for all discharge needs, 1 to 1 time with Social worker, Explore available resources and support systems, Assess for adequacy in community support network, Educate family and significant other(s) on suicide prevention, Complete Psychosocial Assessment, Interpersonal group therapy.  Evaluation of Outcomes: Progressing   Progress in Treatment: Attending groups: Yes.and no Participating in groups: Yes.and no Taking medication as prescribed: Yes. Toleration medication: Yes. Family/Significant other contact made: Yes, individual(s) contacted:  [name] (daughter) [phone] and Meghan Vaughtn (daughter) [phone] Patient understands diagnosis: Yes. Discussing patient identified problems/goals with staff: Yes. Medical problems stabilized or resolved: Yes. Denies suicidal/homicidal ideation: Yes. Issues/concerns per patient self-inventory: No. Other: none   New problem(s) identified: No, Describe:  none  identified  New Short Term/Long Term Goal(s): elimination of symptoms of psychosis, medication management for mood stabilization; elimination of SI thoughts; development of comprehensive mental wellness plan.   Patient Goals:   I need to work on getting better, I was feeling depression and anxiety   Discharge Plan or Barriers:  CSW will assist with appropriate discharge planning   Reason for Continuation of Hospitalization: Anxiety Depression Medication stabilization  Estimated Length of Stay:1 to 7 days  Last 3 [place] Suicide Severity Risk Score: Flowsheet Row Admission (Current) from 02/09/2024 in [facility]  BEHAVIORAL MEDICINE ED from 02/08/2024 in [facility] Emergency Department at [facility] Admission (Discharged) from 11/16/2023 in [facility]  C-SSRS RISK CATEGORY High Risk High Risk No Risk    Last PHQ 2/9 Scores:     No data to display          Scribe for Treatment Team: Pamila Nine, KEN 02/19/2024 2:15 PM

## 2024-02-19 NOTE — Progress Notes (Signed)
 [facility] MD Progress Note  02/19/2024 6:52 PM Karen [name]  MRN:  [id number]  Karen [name] is a 69 year old female with a significant psychiatric history including major depressive disorder, bipolar disorder, PTSD, and anxiety, as well as multiple complex medical comorbidities, who presents to the emergency department after an episode of self-inflicted cutting to the left wrist.   Subjective:  Chart reviewed, case discussed in multidisciplinary meeting, patient seen during rounds.   Patient is noted to be sitting in her room.  She reports doing a lot better today.  She reports that she had a fair amount of sleep last night as the noises on the unit were less.  She reports improvement in her anxiety and depression and less mood swings since initiation of Latuda .  She expressed her understanding about the need for titration of Latuda  dose.  Patient has fair appetite.  She denies any side effects to medication.  Patient denies SI/HI/plan and denies hallucinations.  Past Psychiatric History: see h&P Family History:  Family History  Problem Relation Age of Onset   Breast cancer Neg Hx    Social History:  Social History   Substance and Sexual Activity  Alcohol Use Not Currently   Comment: occ. wine maybe 1  month     Social History   Substance and Sexual Activity  Drug Use No    Social History   Socioeconomic History   Marital status: Divorced    Spouse name: Not on file   Number of children: Not on file   Years of education: Not on file   Highest education level: Not on file  Occupational History   Not on file  Tobacco Use   Smoking status: Former    Current packs/day: 0.00    Average packs/day: 0.5 packs/day for 48.0 years (24.0 ttl pk-yrs)    Types: Cigarettes    Start date: 02/09/1962    Quit date: 02/09/2010    Years since quitting: 14.0   Smokeless tobacco: Never  Vaping Use   Vaping status: Never Used  Substance and Sexual Activity   Alcohol use: Not Currently     Comment: occ. wine maybe 1  month   Drug use: No   Sexual activity: Not on file  Other Topics Concern   Not on file  Social History Narrative   Not on file   Social Drivers of Health   Tobacco Use: Medium Risk (02/09/2024)   Patient History    Smoking Tobacco Use: Former    Smokeless Tobacco Use: Never    Passive Exposure: Not on file  Financial Resource Strain: Patient Declined (10/15/2023)   Received from [facility]   Overall Financial Resource Strain (CARDIA)    How hard is it for you to pay for the very basics like food, housing, medical care, and heating?: Patient declined  Recent Concern: Financial Resource Strain - Medium Risk (09/23/2023)   Received from [facility]   Overall Financial Resource Strain (CARDIA)    Difficulty of Paying Living Expenses: Somewhat hard  Food Insecurity: No Food Insecurity (02/09/2024)   Epic    Worried About Running Out of Food in the Last Year: Never true    Ran Out of Food in the Last Year: Never true  Transportation Needs: No Transportation Needs (02/09/2024)   Epic    Lack of Transportation (Medical): No    Lack of Transportation (Non-Medical): No  Physical Activity: Not on file  Stress: Not on file  Social Connections: Socially Isolated (  02/09/2024)   Social Connection and Isolation Panel    Frequency of Communication with Friends and Family: Three times a week    Frequency of Social Gatherings with Friends and Family: Three times a week    Attends Religious Services: Never    Active Member of Clubs or Organizations: No    Attends Banker Meetings: Never    Marital Status: Divorced  Depression (PHQ2-9): Not on file  Alcohol Screen: Low Risk (02/09/2024)   Alcohol Screen    Last Alcohol Screening Score (AUDIT): 0  Housing: Low Risk (02/09/2024)   Epic    Unable to Pay for Housing in the Last Year: No    Number of Times Moved in the Last Year: 0    Homeless in the Last Year: No  Utilities: Not  At Risk (02/09/2024)   Epic    Threatened with loss of utilities: No  Health Literacy: Not on file   Past Medical History:  Past Medical History:  Diagnosis Date   Anemia    Anxiety    Arthritis    Breast cancer (HCC) 1995   left breast cancer - chemo and modified mastectomy   Depression    Dyspnea    Dysrhythmia    Essential thrombocytosis (HCC)    GERD (gastroesophageal reflux disease)    Heart murmur    History of hiatal hernia    Hyperlipidemia    Hyperthyroidism    Mitral valve prolapse 1997   Normal pressure hydrocephalus (HCC) 2022   shunted   Osteoporosis    Personal history of chemotherapy 1995   BREAST CA   Personal history of radiation therapy 1995   BREAST CA   Sjogren's syndrome    Vitamin D deficiency     Past Surgical History:  Procedure Laterality Date   AUGMENTATION MAMMAPLASTY Bilateral 1995   BREAST LUMPECTOMY Left 1995   COLONOSCOPY N/A 11/16/2023   Procedure: COLONOSCOPY;  Surgeon: Maryruth Ole DASEN, MD;  Location: [facility] ENDOSCOPY;  Service: Endoscopy;  Laterality: N/A;   COLONOSCOPY WITH PROPOFOL  N/A 07/16/2017   Procedure: COLONOSCOPY WITH PROPOFOL ;  Surgeon: Viktoria Lamar DASEN, MD;  Location: [facility] ENDOSCOPY;  Service: Endoscopy;  Laterality: N/A;   ESOPHAGOGASTRODUODENOSCOPY N/A 11/16/2023   Procedure: EGD (ESOPHAGOGASTRODUODENOSCOPY);  Surgeon: Maryruth Ole DASEN, MD;  Location: [facility] ENDOSCOPY;  Service: Endoscopy;  Laterality: N/A;   MASTECTOMY Left 1995   Tram flap W/IMPLANT   TONSILLECTOMY      Current Medications: Current Facility-Administered Medications  Medication Dose Route Frequency Provider Last Rate Last Admin   acetaminophen  (TYLENOL ) tablet 650 mg  650 mg Oral Q6H PRN Smith, Annie B, NP   650 mg at 02/19/24 1834   alum & mag hydroxide-simeth (MAALOX/MYLANTA) 200-200-20 MG/5ML suspension 30 mL  30 mL Oral Q4H PRN Smith, Annie B, NP       atorvastatin  (LIPITOR ) tablet 80 mg  80 mg Oral Daily Smith, Annie B, NP   80 mg at 02/19/24  9085   collagenase  (SANTYL ) ointment   Topical Daily Tamla Winkels, MD   1 Application at 02/19/24 9365   feeding supplement (ENSURE PLUS HIGH PROTEIN) liquid 237 mL  237 mL Oral BID BM Madaram, Kondal R, MD   237 mL at 02/18/24 1406   lurasidone  (LATUDA ) tablet 20 mg  20 mg Oral Q breakfast Laporscha Linehan, MD   20 mg at 02/19/24 0754   magnesium  hydroxide (MILK OF MAGNESIA) suspension 30 mL  30 mL Oral Daily PRN Smith, Annie B, NP  mirtazapine  (REMERON ) tablet 15 mg  15 mg Oral QHS Madaram, Kondal R, MD   15 mg at 02/18/24 2115   multivitamin with minerals tablet 1 tablet  1 tablet Oral Daily Smith, Annie B, NP   1 tablet at 02/19/24 9085   OLANZapine  (ZYPREXA ) injection 5 mg  5 mg Intramuscular TID PRN Smith, Annie B, NP       OLANZapine  zydis (ZYPREXA ) disintegrating tablet 5 mg  5 mg Oral TID PRN Smith, Annie B, NP       QUEtiapine  (SEROQUEL ) tablet 25 mg  25 mg Oral BID Madaram, Kondal R, MD   25 mg at 02/19/24 1834   raloxifene  (EVISTA ) tablet 60 mg  60 mg Oral Daily Smith, Annie B, NP   60 mg at 02/19/24 9085   thiamine  (VITAMIN B1) tablet 100 mg  100 mg Oral Daily Madaram, Kondal R, MD   100 mg at 02/19/24 0913   traZODone  (DESYREL ) tablet 150 mg  150 mg Oral QHS Smith, Annie B, NP   150 mg at 02/18/24 2115    Lab Results: No results found for this or any previous visit (from the past 48 hours).  Blood Alcohol level:  Lab Results  Component Value Date   [facility] <15 02/08/2024    Metabolic Disorder Labs: Lab Results  Component Value Date   HGBA1C 5.6 02/10/2024   MPG 114.02 02/10/2024   No results found for: PROLACTIN Lab Results  Component Value Date   CHOL 143 02/10/2024   TRIG 74 02/10/2024   HDL 59 02/10/2024   CHOLHDL 2.4 02/10/2024   VLDL 15 02/10/2024   LDLCALC 69 02/10/2024    Physical Findings: AIMS:  , ,  ,  ,    CIWA:    COWS:       02/16/2024   12:00 PM  Montreal Cognitive Assessment   Visuospatial/ Executive (0/5) 4  Naming (0/3) 3  Attention:  Read list of digits (0/2) 2  Attention: Read list of letters (0/1) 1  Attention: Serial 7 subtraction starting at 100 (0/3) 1  Language: Repeat phrase (0/2) 2  Language : Fluency (0/1) 1  Abstraction (0/2) 2  Delayed Recall (0/5) 0  Orientation (0/6) 5  Total 21  Adjusted Score (based on education) 21     Psychiatric Specialty Exam:  Presentation  General Appearance: Appropriate for Environment  Eye Contact:Fair  Speech:Slow  Speech Volume:Decreased    Mood and Affect  Mood:better  Affect:stable  Thought Process  Thought Processes:linear Orientation:Partial  Thought Content:coherent Hallucinations: Denies  Ideas of Reference:None  Suicidal Thoughts: Denies  Homicidal Thoughts: Denies   Sensorium  Memory:Immediate Fair; Remote Fair  Judgment:improving Insight:Shallow   Executive Functions  Concentration:Fair  Attention Span:Fair  Recall:Fair  Fund of Knowledge:Fair  Language:Fair   Psychomotor Activity  Psychomotor Activity:No data recorded  Musculoskeletal: Strength & Muscle Tone: within normal limits Gait & Station: normal Assets  Assets:Communication Skills; Social Support    Physical Exam: Physical Exam Vitals and nursing note reviewed.    ROS Blood pressure 119/74, pulse 97, temperature (!) 97.3 F (36.3 C), resp. rate 18, height 5' 5 (1.651 m), weight 43.8 kg, SpO2 99%. Body mass index is 16.06 kg/m.  Diagnosis: Active Problems:   MDD (major depressive disorder), recurrent severe, without psychosis (HCC)   PLAN: Safety and Monitoring:  -- Voluntary admission to inpatient psychiatric unit for safety, stabilization and treatment  -- Daily contact with patient to assess and evaluate symptoms and progress in treatment  --  Patient's case to be discussed in multi-disciplinary team meeting  -- Observation Level : q15 minute checks  -- Vital signs:  q12 hours  -- Precautions: suicide, elopement, and assault -- Encouraged  patient to participate in unit milieu and in scheduled group therapies  2. Psychiatric Treatment:  Scheduled Medications:  Seroquel  25 mg twice daily Remeron  15 mg nightly Trazodone  150 mg nightly Latuda  20 mg every morning   -- The risks/benefits/side-effects/alternatives to this medication were discussed in detail with the patient and time was given for questions. The patient consents to medication trial.  3. Medical Issues Being Addressed:     4. Discharge Planning:   -- Social work and case management to assist with discharge planning and identification of hospital follow-up needs prior to discharge  -- Estimated LOS: 3-4 days  Allyn Foil, MD 02/19/2024, 6:52 PM

## 2024-02-19 NOTE — Plan of Care (Signed)
   Problem: Education: Goal: Emotional status will improve Outcome: Progressing Goal: Mental status will improve Outcome: Progressing Goal: Verbalization of understanding the information provided will improve Outcome: Progressing   Problem: Activity: Goal: Interest or engagement in activities will improve Outcome: Progressing

## 2024-02-19 NOTE — Group Note (Signed)
 Date:  02/19/2024 Time:  4:04 PM  Group Topic/Focus:  Managing Feelings:   The focus of this group is to identify what feelings patients have difficulty handling and develop a plan to handle them in a healthier way upon discharge.    Participation Level:  Did Not Attend   Arland Nutting 02/19/2024, 4:04 PM

## 2024-02-19 NOTE — Group Note (Signed)
 Date:  02/19/2024 Time:  10:58 AM  Group Topic/Focus:  Coping With Mental Health Crisis:   The purpose of this group is to help patients identify strategies for coping with mental health crisis.  Group discusses possible causes of crisis and ways to manage them effectively.    Participation Level:  Active  Participation Quality:  Appropriate  Affect:  Appropriate  Cognitive:  Appropriate  Insight: Appropriate  Engagement in Group:  Engaged  Modes of Intervention:  Discussion   Karen [name] 02/19/2024, 10:58 AM

## 2024-02-19 NOTE — Group Note (Signed)
 Date:  02/19/2024 Time:  3:02 PM  Group Topic/Focus:  Movement Therapy    Participation Level:  Active  Participation Quality:  Appropriate  Affect:  Appropriate  Cognitive:  Appropriate  Insight: Appropriate  Engagement in Group:  Engaged  Modes of Intervention:  Activity  Additional Comments:  none  Karen [name] Bias 02/19/2024, 3:02 PM

## 2024-02-19 NOTE — Plan of Care (Signed)
  Problem: Activity: Goal: Sleeping patterns will improve Outcome: Progressing   Problem: Coping: Goal: Ability to demonstrate self-control will improve Outcome: Progressing   

## 2024-02-20 DIAGNOSIS — F332 Major depressive disorder, recurrent severe without psychotic features: Secondary | ICD-10-CM | POA: Diagnosis not present

## 2024-02-20 NOTE — Group Note (Signed)
 Date:  02/20/2024 Time:  3:19 PM  Group Topic/Focus:  House rules and unit expectations    Participation Level:  Did Not Attend    Norleen SHAUNNA Bias 02/20/2024, 3:19 PM

## 2024-02-20 NOTE — Progress Notes (Signed)
 [facility] MD Progress Note  02/20/2024 1:15 PM Karen [name]  MRN:  [id number]  Karen [name] is a 69 year old female with a significant psychiatric history including major depressive disorder, bipolar disorder, PTSD, and anxiety, as well as multiple complex medical comorbidities, who presents to the emergency department after an episode of self-inflicted cutting to the left wrist.   Subjective:  Chart reviewed, case discussed in multidisciplinary meeting, patient seen during rounds.   Today on interview patient reports that she feels sad about how she ended up in the hospital and putting pressure on her daughters.  Patient and provider discussed at length about changing roles with aging and changing responsibilities amongst a family member to take care of her the elderly and the family.  Patient expressed her understanding.  She denies SI/HI/plan and denies hallucinations.  She reports being a light sleeper and her home being the best place to get some rest at night.  She acknowledged and is looking forward to go for her appointments with her outpatient neurologist and psychiatrist.  She is also well aware of the PHP programs in [place] that she will be transitioning to after discharge. Past Psychiatric History: see h&P Family History:  Family History  Problem Relation Age of Onset   Breast cancer Neg Hx    Social History:  Social History   Substance and Sexual Activity  Alcohol Use Not Currently   Comment: occ. wine maybe 1  month     Social History   Substance and Sexual Activity  Drug Use No    Social History   Socioeconomic History   Marital status: Divorced    Spouse name: Not on file   Number of children: Not on file   Years of education: Not on file   Highest education level: Not on file  Occupational History   Not on file  Tobacco Use   Smoking status: Former    Current packs/day: 0.00    Average packs/day: 0.5 packs/day for 48.0 years (24.0 ttl pk-yrs)     Types: Cigarettes    Start date: 02/09/1962    Quit date: 02/09/2010    Years since quitting: 14.0   Smokeless tobacco: Never  Vaping Use   Vaping status: Never Used  Substance and Sexual Activity   Alcohol use: Not Currently    Comment: occ. wine maybe 1  month   Drug use: No   Sexual activity: Not on file  Other Topics Concern   Not on file  Social History Narrative   Not on file   Social Drivers of Health   Tobacco Use: Medium Risk (02/09/2024)   Patient History    Smoking Tobacco Use: Former    Smokeless Tobacco Use: Never    Passive Exposure: Not on file  Financial Resource Strain: Patient Declined (10/15/2023)   Received from [facility]   Overall Financial Resource Strain (CARDIA)    How hard is it for you to pay for the very basics like food, housing, medical care, and heating?: Patient declined  Recent Concern: Financial Resource Strain - Medium Risk (09/23/2023)   Received from [facility]   Overall Financial Resource Strain (CARDIA)    Difficulty of Paying Living Expenses: Somewhat hard  Food Insecurity: No Food Insecurity (02/09/2024)   Epic    Worried About Running Out of Food in the Last Year: Never true    Ran Out of Food in the Last Year: Never true  Transportation Needs: No Transportation Needs (02/09/2024)  Epic    Lack of Transportation (Medical): No    Lack of Transportation (Non-Medical): No  Physical Activity: Not on file  Stress: Not on file  Social Connections: Socially Isolated (02/09/2024)   Social Connection and Isolation Panel    Frequency of Communication with Friends and Family: Three times a week    Frequency of Social Gatherings with Friends and Family: Three times a week    Attends Religious Services: Never    Active Member of Clubs or Organizations: No    Attends Banker Meetings: Never    Marital Status: Divorced  Depression (PHQ2-9): Not on file  Alcohol Screen: Low Risk (02/09/2024)   Alcohol Screen     Last Alcohol Screening Score (AUDIT): 0  Housing: Low Risk (02/09/2024)   Epic    Unable to Pay for Housing in the Last Year: No    Number of Times Moved in the Last Year: 0    Homeless in the Last Year: No  Utilities: Not At Risk (02/09/2024)   Epic    Threatened with loss of utilities: No  Health Literacy: Not on file   Past Medical History:  Past Medical History:  Diagnosis Date   Anemia    Anxiety    Arthritis    Breast cancer (HCC) 1995   left breast cancer - chemo and modified mastectomy   Depression    Dyspnea    Dysrhythmia    Essential thrombocytosis (HCC)    GERD (gastroesophageal reflux disease)    Heart murmur    History of hiatal hernia    Hyperlipidemia    Hyperthyroidism    Mitral valve prolapse 1997   Normal pressure hydrocephalus (HCC) 2022   shunted   Osteoporosis    Personal history of chemotherapy 1995   BREAST CA   Personal history of radiation therapy 1995   BREAST CA   Sjogren's syndrome    Vitamin D deficiency     Past Surgical History:  Procedure Laterality Date   AUGMENTATION MAMMAPLASTY Bilateral 1995   BREAST LUMPECTOMY Left 1995   COLONOSCOPY N/A 11/16/2023   Procedure: COLONOSCOPY;  Surgeon: Maryruth Ole DASEN, MD;  Location: [facility] ENDOSCOPY;  Service: Endoscopy;  Laterality: N/A;   COLONOSCOPY WITH PROPOFOL  N/A 07/16/2017   Procedure: COLONOSCOPY WITH PROPOFOL ;  Surgeon: Viktoria Lamar DASEN, MD;  Location: [facility] ENDOSCOPY;  Service: Endoscopy;  Laterality: N/A;   ESOPHAGOGASTRODUODENOSCOPY N/A 11/16/2023   Procedure: EGD (ESOPHAGOGASTRODUODENOSCOPY);  Surgeon: Maryruth Ole DASEN, MD;  Location: [facility] ENDOSCOPY;  Service: Endoscopy;  Laterality: N/A;   MASTECTOMY Left 1995   Tram flap W/IMPLANT   TONSILLECTOMY      Current Medications: Current Facility-Administered Medications  Medication Dose Route Frequency Provider Last Rate Last Admin   acetaminophen  (TYLENOL ) tablet 650 mg  650 mg Oral Q6H PRN Smith, Annie B, NP   650 mg at  02/19/24 1834   alum & mag hydroxide-simeth (MAALOX/MYLANTA) 200-200-20 MG/5ML suspension 30 mL  30 mL Oral Q4H PRN Smith, Annie B, NP       atorvastatin  (LIPITOR ) tablet 80 mg  80 mg Oral Daily Smith, Annie B, NP   80 mg at 02/20/24 9053   collagenase  (SANTYL ) ointment   Topical Daily Willys Salvino, MD   1 Application at 02/20/24 0630   feeding supplement (ENSURE PLUS HIGH PROTEIN) liquid 237 mL  237 mL Oral BID BM Madaram, Kondal R, MD   237 mL at 02/18/24 1406   lurasidone  (LATUDA ) tablet 20 mg  20 mg Oral  Q breakfast Samule Life, MD   20 mg at 02/20/24 0815   magnesium  hydroxide (MILK OF MAGNESIA) suspension 30 mL  30 mL Oral Daily PRN Smith, Annie B, NP       mirtazapine  (REMERON ) tablet 15 mg  15 mg Oral QHS Madaram, Kondal R, MD   15 mg at 02/19/24 2138   multivitamin with minerals tablet 1 tablet  1 tablet Oral Daily Smith, Annie B, NP   1 tablet at 02/20/24 9053   OLANZapine  (ZYPREXA ) injection 5 mg  5 mg Intramuscular TID PRN Smith, Annie B, NP       OLANZapine  zydis (ZYPREXA ) disintegrating tablet 5 mg  5 mg Oral TID PRN Smith, Annie B, NP       QUEtiapine  (SEROQUEL ) tablet 25 mg  25 mg Oral BID Madaram, Kondal R, MD   25 mg at 02/20/24 0946   raloxifene  (EVISTA ) tablet 60 mg  60 mg Oral Daily Smith, Annie B, NP   60 mg at 02/20/24 9052   thiamine  (VITAMIN B1) tablet 100 mg  100 mg Oral Daily Madaram, Kondal R, MD   100 mg at 02/20/24 0946   traZODone  (DESYREL ) tablet 150 mg  150 mg Oral QHS Smith, Annie B, NP   150 mg at 02/19/24 2138    Lab Results: No results found for this or any previous visit (from the past 48 hours).  Blood Alcohol level:  Lab Results  Component Value Date   [facility] <15 02/08/2024    Metabolic Disorder Labs: Lab Results  Component Value Date   HGBA1C 5.6 02/10/2024   MPG 114.02 02/10/2024   No results found for: PROLACTIN Lab Results  Component Value Date   CHOL 143 02/10/2024   TRIG 74 02/10/2024   HDL 59 02/10/2024   CHOLHDL 2.4 02/10/2024    VLDL 15 02/10/2024   LDLCALC 69 02/10/2024    Physical Findings: AIMS:  , ,  ,  ,    CIWA:    COWS:       02/16/2024   12:00 PM  Montreal Cognitive Assessment   Visuospatial/ Executive (0/5) 4  Naming (0/3) 3  Attention: Read list of digits (0/2) 2  Attention: Read list of letters (0/1) 1  Attention: Serial 7 subtraction starting at 100 (0/3) 1  Language: Repeat phrase (0/2) 2  Language : Fluency (0/1) 1  Abstraction (0/2) 2  Delayed Recall (0/5) 0  Orientation (0/6) 5  Total 21  Adjusted Score (based on education) 21     Psychiatric Specialty Exam:  Presentation  General Appearance: Appropriate for Environment  Eye Contact:Fair  Speech:Slow  Speech Volume:Decreased    Mood and Affect  Mood:better  Affect:stable  Thought Process  Thought Processes:linear Orientation:Partial  Thought Content:coherent Hallucinations: Denies  Ideas of Reference:None  Suicidal Thoughts: Denies  Homicidal Thoughts: Denies   Sensorium  Memory:Immediate Fair; Remote Fair  Judgment:improving Insight:Shallow   Executive Functions  Concentration:Fair  Attention Span:Fair  Recall:Fair  Fund of Knowledge:Fair  Language:Fair   Psychomotor Activity  Psychomotor Activity:No data recorded  Musculoskeletal: Strength & Muscle Tone: within normal limits Gait & Station: normal Assets  Assets:Communication Skills; Social Support    Physical Exam: Physical Exam Vitals and nursing note reviewed.    ROS Blood pressure 133/77, pulse (!) 107, temperature 97.7 F (36.5 C), resp. rate 14, height 5' 5 (1.651 m), weight 43.8 kg, SpO2 100%. Body mass index is 16.06 kg/m.  Diagnosis: Active Problems:   MDD (major depressive disorder), recurrent severe, without  psychosis (HCC)   PLAN: Safety and Monitoring:  -- Voluntary admission to inpatient psychiatric unit for safety, stabilization and treatment  -- Daily contact with patient to assess and evaluate  symptoms and progress in treatment  -- Patient's case to be discussed in multi-disciplinary team meeting  -- Observation Level : q15 minute checks  -- Vital signs:  q12 hours  -- Precautions: suicide, elopement, and assault -- Encouraged patient to participate in unit milieu and in scheduled group therapies  2. Psychiatric Treatment:  Scheduled Medications:  Seroquel  25 mg twice daily Remeron  15 mg nightly Trazodone  150 mg nightly Latuda  20 mg every morning   -- The risks/benefits/side-effects/alternatives to this medication were discussed in detail with the patient and time was given for questions. The patient consents to medication trial.  3. Medical Issues Being Addressed:     4. Discharge Planning:   -- Social work and case management to assist with discharge planning and identification of hospital follow-up needs prior to discharge  -- Estimated LOS: 3-4 days  Allyn Foil, MD 02/20/2024, 1:15 PM

## 2024-02-20 NOTE — Plan of Care (Signed)
" °  Problem: Activity: Goal: Interest or engagement in activities will improve Outcome: Adequate for Discharge Goal: Sleeping patterns will improve Outcome: Adequate for Discharge   Problem: Coping: Goal: Ability to verbalize frustrations and anger appropriately will improve Outcome: Adequate for Discharge Goal: Ability to demonstrate self-control will improve Outcome: Adequate for Discharge   "

## 2024-02-20 NOTE — Group Note (Signed)
 Date:  02/20/2024 Time:  9:31 PM  Group Topic/Focus:  Wrap-Up Group:   The focus of this group is to help patients review their daily goal of treatment and discuss progress on daily workbooks.    Participation Level:  Did Not Attend  Participation Quality:     Affect:     Cognitive:     Insight: None  Engagement in Group:  None  Modes of Intervention:     Additional Comments:    Tommas [name] Bunker 02/20/2024, 9:31 PM

## 2024-02-20 NOTE — Group Note (Signed)
 [facility] LCSW Group Therapy Note   Group Date: 02/20/2024 Start Time: 1100 End Time: 1130  Type of Therapy and Topic:  Group Therapy:  Feelings around Relapse and Recovery  Participation Level:  Did Not Attend   Mood: Not able to assess.  Description of Group:    Patients in this group will discuss emotions they experience before and after a relapse. They will process how experiencing these feelings, or avoidance of experiencing them, relates to having a relapse. Facilitator will guide patients to explore emotions they have related to recovery. Patients will be encouraged to process which emotions are more powerful. They will be guided to discuss the emotional reaction significant others in their lives may have to patients relapse or recovery. Patients will be assisted in exploring ways to respond to the emotions of others without this contributing to a relapse.  Therapeutic Goals: Patient will identify two or more emotions that lead to relapse for them:  Patient will identify two emotions that result when they relapse:  Patient will identify two emotions related to recovery:  Patient will demonstrate ability to communicate their needs through discussion and/or role plays.   Summary of Patient Progress:  Pt did not attend group.   Therapeutic Modalities:   Cognitive Behavioral Therapy Solution-Focused Therapy Assertiveness Training Relapse Prevention Therapy   Rexene LELON Mae, LCSWA

## 2024-02-20 NOTE — [facility] Counselor (Signed)
 CSW contacted pt's daughter Gerard Collier to let her know that the pt will not be discharged in time to attend doctor's appointment at 8:30 am on Monday 02/21/24.  Pt's daughter will reschedule the appointment for pt.

## 2024-02-20 NOTE — Progress Notes (Signed)
" °   02/19/24 2241  Psych Admission Type (Psych Patients Only)  Admission Status Voluntary  Psychosocial Assessment  Patient Complaints Worrying  Eye Contact Avoids  Facial Expression Flat  Affect Appropriate to circumstance  Speech Logical/coherent  Interaction Minimal  Motor Activity Slow  Appearance/Hygiene Unremarkable  Behavior Characteristics Cooperative  Mood Pleasant;Depressed  Thought Process  Coherency WDL  Content WDL  Delusions None reported or observed  Perception WDL  Hallucination None reported or observed  Judgment Limited  Confusion Mild  Danger to Self  Current suicidal ideation? Denies  Self-Injurious Behavior No self-injurious ideation or behavior indicators observed or expressed   Agreement Not to Harm Self Yes  Description of Agreement verbal  Danger to Others  Danger to Others None reported or observed   Patient has been up and visible on the unit at intervals, mostly withdrawn to self. Easily compliant with scheduled meds. No complaints voiced. Safe behaviors displayed thus far.  Slept throughout the night. Will continue to monitor.  Estimated Sleeping Duration (Last 24 Hours): 10.00-11.50 hours  "

## 2024-02-20 NOTE — Plan of Care (Signed)
  Problem: Activity: Goal: Sleeping patterns will improve Outcome: Progressing   

## 2024-02-20 NOTE — Group Note (Signed)
 Date:  02/20/2024 Time:  11:00 AM  Group Topic/Focus:  Self Care:   The focus of this group is to help patients understand the importance of self-care in order to improve or restore emotional, physical, spiritual, interpersonal, and financial health.    Participation Level:  Active  Participation Quality:  Appropriate  Affect:  Appropriate  Cognitive:  Alert  Insight: Appropriate  Engagement in Group:  Engaged  Modes of Intervention:  Activity  Additional Comments:    Theo Krumholz C Braniyah Besse 02/20/2024, 11:00 AM

## 2024-02-20 NOTE — Progress Notes (Signed)
 Patient is discharging 01.12.26. She is requesting to have home health set up with wound care for L wrist.    02/20/24 0900  Psych Admission Type (Psych Patients Only)  Admission Status Voluntary  Psychosocial Assessment  Patient Complaints Worrying  Eye Contact Fair  Facial Expression Flat  Affect Appropriate to circumstance  Speech Logical/coherent  Interaction Minimal  Motor Activity Slow  Appearance/Hygiene Unremarkable  Behavior Characteristics Cooperative  Mood Depressed  Thought Process  Coherency WDL  Content WDL  Delusions None reported or observed  Perception WDL  Hallucination None reported or observed  Judgment Limited  Confusion Mild  Danger to Self  Current suicidal ideation? Denies  Danger to Others  Danger to Others None reported or observed

## 2024-02-21 ENCOUNTER — Other Ambulatory Visit: Payer: Self-pay

## 2024-02-21 DIAGNOSIS — F332 Major depressive disorder, recurrent severe without psychotic features: Secondary | ICD-10-CM | POA: Diagnosis not present

## 2024-02-21 MED ORDER — ADULT MULTIVITAMIN W/MINERALS CH
1.0000 | ORAL_TABLET | Freq: Every day | ORAL | 0 refills | Status: AC
  Filled 2024-02-21: qty 30, 30d supply, fill #0

## 2024-02-21 MED ORDER — QUETIAPINE FUMARATE 25 MG PO TABS
25.0000 mg | ORAL_TABLET | Freq: Two times a day (BID) | ORAL | 0 refills | Status: AC
  Filled 2024-02-21: qty 60, 30d supply, fill #0

## 2024-02-21 MED ORDER — RALOXIFENE HCL 60 MG PO TABS
60.0000 mg | ORAL_TABLET | Freq: Every day | ORAL | 0 refills | Status: AC
  Filled 2024-02-21: qty 30, 30d supply, fill #0

## 2024-02-21 MED ORDER — ATORVASTATIN CALCIUM 80 MG PO TABS
80.0000 mg | ORAL_TABLET | Freq: Every day | ORAL | 0 refills | Status: AC
  Filled 2024-02-21: qty 30, 30d supply, fill #0

## 2024-02-21 MED ORDER — TRAZODONE HCL 150 MG PO TABS
150.0000 mg | ORAL_TABLET | Freq: Every day | ORAL | 2 refills | Status: AC
  Filled 2024-02-21: qty 30, 30d supply, fill #0

## 2024-02-21 MED ORDER — VITAMIN B-1 100 MG PO TABS
100.0000 mg | ORAL_TABLET | Freq: Every day | ORAL | 0 refills | Status: AC
  Filled 2024-02-21: qty 26, 26d supply, fill #0

## 2024-02-21 MED ORDER — LURASIDONE HCL 20 MG PO TABS
20.0000 mg | ORAL_TABLET | Freq: Every day | ORAL | 0 refills | Status: AC
  Filled 2024-02-21: qty 30, 30d supply, fill #0

## 2024-02-21 MED ORDER — MIRTAZAPINE 30 MG PO TABS
15.0000 mg | ORAL_TABLET | Freq: Every day | ORAL | 0 refills | Status: AC
  Filled 2024-02-21: qty 15, 30d supply, fill #0

## 2024-02-21 NOTE — Group Note (Signed)
 Recreation Therapy Group Note   Group Topic:Stress Management  Group Date: 02/21/2024 Start Time: 1415 End Time: 1440 Facilitators: Celestia Jeoffrey BRAVO, LRT, CTRS Location: Dayroom  Group Description: PMR (Progressive Muscle Relaxation). LRT educates patients on what PMR is and the benefits that come from it. Patients are asked to sit with their feet flat on the floor while sitting up and all the way back in their chair, if possible. LRT and pts follow a prompt through a speaker that requires you to tense and release different muscles in their body and focus on their breathing. During session, lights are off and soft music is being played. Pts are given a stress ball to use if needed.   Goal Area(s) Addressed:  Patients will be able to describe progressive muscle relaxation.  Patient will practice using relaxation technique. Patient will identify a new coping skill.  Patient will follow multistep directions to reduce anxiety and stress.   Affect/Mood: N/A   Participation Level: Did not attend    Clinical Observations/Individualized Feedback: Patient did not attend.  Plan: Continue to engage patient in RT group sessions 2-3x/week.   Jeoffrey BRAVO Celestia, LRT, CTRS 02/21/2024 4:44 PM

## 2024-02-21 NOTE — Consult Note (Signed)
 WOC Nurse wound follow up as requested by provider to reassess discharge dressing plan Wound type: trauma Measurement: wide area of involvement Wound bed: upper slit wound with non-viable tissue, all other linear wounds are with scar tissue and moist Drainage none Periwound: small amount of edge erythema Dressing procedure/placement/frequency: Recommend to continue Santyl  ointment as indicated on the wound care order  Explained to primary nurse to make sure patient has wound care supplies for home use. Gave patient instructions on how to do dressing, how to clean the wound and reapply ointment, what the ointment is used for, use of foam dressing, signs and symptoms of infection, allowed patient to verbalizing how she feels about her wound and the stigma that it can carry. Reviewed how to conceal the wound with silicone foam dressings at social gatherings, that scar tissue could remain after the wound heals.   02/21/24 Left Wrist     Informed patient is being discharged today. Recommend home care nursing services to assist patient in leaning how to do her own dressing in her home environment and to monitor wound condition. Patient should follow-up with PCP.   Please reconsult if wound worsens in condition and notify provider.   Sherrilyn Hals MSN RN CWOCN WOC [facility]  [phone] (Available from 7-3 pm Mon-Friday)

## 2024-02-21 NOTE — Progress Notes (Signed)
" °  Karen [name] :  Will you be returning to the same living situation after discharge:  Yes,  pt will return home  At discharge, do you have transportation home?: Yes,  pt's daughter will pick up from the hospital  Do you have the ability to pay for your medications: Yes,  MEDICARE / MEDICARE PART A AND B  Release of information consent forms completed and in the chart;  Patient's signature needed at discharge.  Patient to Follow up at:  Follow-up Information     [facility]. Go on 03/06/2024.   Why: Your appointment with Dr.[name] Omie is scheduled for 03/06/2024 at 8:30 AM . According to the practice, all therapy referrals must be internal, directly from your provider. Please contact your provider once discharged from the hospital or discuss it at your upcoming visit. Contact information: [address], [place], [place] [address]  [phone]        Daaleman, Evalene SQUIBB, MD. Go on 02/24/2024.   Specialty: Family Medicine Why: Your appointment is scheduled for 02/24/24 at 9:20 AM. At this appointment you will be able to discuss home health with your provider, Dr. Adrianna Pass information: [id number] [facility] [address] [phone]         [facility] Follow up.   Specialty: Urgent Care Why: You are scheduled for a virtual assessment for the Partial Hospitalization Program on Wednesday 1/14 at 10:00 am. PHP is group therapy that meets in-person Mon-Thur from 9am-2pm and Friday from 9am-1pm and lasts for approximately 2-3 weeks. This assessment appointment will last approximately 1-1.5 hours and will be virtual via MyChart. If you need to cancel or reschedule, please call [phone] Contact information: [address] [place]  [address] [phone]                Next level of care provider has access to [facility]  Link:yes  Safety Planning and Suicide Prevention discussed: Yes,  Karen [name], daughter, [phone]     Has patient been referred to the Quitline?: Patient does not use tobacco/nicotine products  Patient has been referred for addiction treatment: No known substance use disorder.  [address] LCSWA 02/21/2024, 11:40 AM "

## 2024-02-21 NOTE — Plan of Care (Signed)
   Problem: Education: Goal: Knowledge of [facility] Education information/materials will improve Outcome: Progressing Goal: Emotional status will improve Outcome: Progressing Goal: Mental status will improve Outcome: Progressing

## 2024-02-21 NOTE — Progress Notes (Signed)
" °   02/21/24 0055  Psych Admission Type (Psych Patients Only)  Admission Status Voluntary  Psychosocial Assessment  Patient Complaints Worrying  Eye Contact Fair  Facial Expression Flat  Affect Appropriate to circumstance  Speech Logical/coherent  Interaction Minimal  Motor Activity Slow  Appearance/Hygiene Unremarkable  Behavior Characteristics Cooperative  Mood Depressed  Thought Process  Coherency WDL  Content WDL  Delusions None reported or observed  Perception WDL  Hallucination None reported or observed  Judgment Limited  Confusion Mild  Danger to Self  Current suicidal ideation? Denies  Self-Injurious Behavior No self-injurious ideation or behavior indicators observed or expressed   Agreement Not to Harm Self Yes  Description of Agreement verbal  Danger to Others  Danger to Others None reported or observed    Estimated Sleeping Duration (Last 24 Hours): 8.50-10.00 hours  "

## 2024-02-21 NOTE — [facility] Counselor (Signed)
 CSW contacted pt's daughter, Carmelita, to go over discharge appointment.   CSW went over appointments with pt's daughter, pt's daughter agreeable to discharge and appointments.   Pt's daughter reports she is going to call [facility] to get appt changed with Dr.Daalemen because it does not work for her schedule.   Lum Croft, MSW, [place] 02/21/2024 11:56 AM

## 2024-02-21 NOTE — Plan of Care (Addendum)
Discharge Note: ? ?D. Pt alert and oriented x 3. Denies SI and HI/A/VH at present.  Compliant with  medications. Pt assessed by MD. Discharged home as ordered.    ? ?A.  Emotional Support offered Patient and encouragement provided. All belongings from locker  returned to Patient at the time of discharge. Discharge instructions reveiwed with Patient. Suicide information given and discussed with Patient who stated he understood and had no question. ? ?R.  Pt verbalized understanding related to discharge instructions. Pt signed belonging sheet in agreement with items received from locker. Denies concerns at this time ambulatory with steady gait. Appears to be in no physical distress.  ?

## 2024-02-21 NOTE — Plan of Care (Signed)
   Problem: Education: Goal: Emotional status will improve Outcome: Progressing Goal: Mental status will improve Outcome: Progressing

## 2024-02-21 NOTE — [facility] Suicide Risk Assessment (Signed)
 [facility] Discharge Suicide Risk Assessment   Principal Problem: <principal problem not specified> Discharge Diagnoses: Active Problems:   MDD (major depressive disorder), recurrent severe, without psychosis (HCC)   Total Time spent with patient: 30 minutes  Musculoskeletal: Strength & Muscle Tone: within normal limits Gait & Station: normal Patient leans: N/A  Psychiatric Specialty Exam  Presentation  General Appearance:  Appropriate for Environment  Eye Contact: Fair  Speech: Clear and Coherent  Speech Volume: Normal  Handedness:No data recorded  Mood and Affect  Mood: Euthymic  Duration of Depression Symptoms: No data recorded Affect: Appropriate   Thought Process  Thought Processes: Coherent  Descriptions of Associations:Intact  Orientation:Full (Time, Place and Person)  Thought Content:Logical  History of Schizophrenia/Schizoaffective disorder:No data recorded Duration of Psychotic Symptoms:No data recorded Hallucinations:Hallucinations: None  Ideas of Reference:None  Suicidal Thoughts:Suicidal Thoughts: No  Homicidal Thoughts:Homicidal Thoughts: No   Sensorium  Memory: Recent Fair  Judgment: Fair  Insight: Fair   Art Therapist  Concentration: Fair  Attention Span: Fair  Recall: [employer] of Knowledge: Fair  Language: Fair   Psychomotor Activity  Psychomotor Activity: Psychomotor Activity: Normal   Assets  Assets: Communication Skills; Desire for Improvement; Resilience   Sleep  Sleep: Sleep: Fair  Estimated Sleeping Duration (Last 24 Hours): 8.25-9.75 hours  Physical Exam: Physical Exam ROS Blood pressure 101/64, pulse (!) 106, temperature 98.2 F (36.8 C), resp. rate 16, height 5' 5 (1.651 m), weight 43.8 kg, SpO2 100%. Body mass index is 16.06 kg/m.  Mental Status Per Nursing Assessment::   On Admission:  Suicidal ideation indicated by patient  Demographic Factors:  Caucasian  Loss  Factors: Decrease in vocational status  Historical Factors: Impulsivity  Risk Reduction Factors:   Living with another person, especially a relative, Positive social support, Positive therapeutic relationship, and Positive coping skills or problem solving skills  Continued Clinical Symptoms:  Depression:   Impulsivity  Cognitive Features That Contribute To Risk:  None    Suicide Risk:  Minimal: No identifiable suicidal ideation.  Patients presenting with no risk factors but with morbid ruminations; may be classified as minimal risk based on the severity of the depressive symptoms   Follow-up Information     [facility]. Go on 03/06/2024.   Why: Your appointment with Dr.[name] Omie is scheduled for 03/06/2024 at 8:30 AM . According to the practice, all therapy referrals must be internal, directly from your provider. Please contact your provider once discharged from the hospital or discuss it at your upcoming visit. Contact information: [address], [place], [place] [address]  [phone]                Plan Of Care/Follow-up recommendations:  Activity:  as tolerated  Allyn Foil, MD 02/21/2024, 9:41 AM

## 2024-02-21 NOTE — Group Note (Signed)
 Date:  02/21/2024 Time:  10:57 AM  Group Topic/Focus:  Self Care:   The focus of this group is to help patients understand the importance of self-care in order to improve or restore emotional, physical, spiritual, interpersonal, and financial health.    Participation Level:  Did Not Attend  Participation Quality:    Affect:    Cognitive:    Insight:   Engagement in Group:    Modes of Intervention:    Additional Comments:    Octavia Velador 02/21/2024, 10:57 AM

## 2024-02-21 NOTE — Progress Notes (Signed)
 " Physician Discharge Summary Note  Patient:  Karen [name] is an 69 y.o., female MRN:  [id number] DOB:  06/08/1955 Patient phone:  [phone] (home)  Patient address:   [address] [place] [address],   Total time spent: 40 min Date of Admission:  02/09/2024 Date of Discharge: 02/21/24  Reason for Admission:  Karen [name] is a 69 year old female with a significant psychiatric history including major depressive disorder, bipolar disorder, PTSD, and anxiety, as well as multiple complex medical comorbidities, who presents to the emergency department after an episode of self-inflicted cutting to the left wrist.   Principal Problem: <principal problem not specified> Discharge Diagnoses: Active Problems:   MDD (major depressive disorder), recurrent severe, without psychosis (HCC)   Past Psychiatric History: see h&p  Family Psychiatric  History: see h&p Social History:  Social History   Substance and Sexual Activity  Alcohol Use Not Currently   Comment: occ. wine maybe 1  month     Social History   Substance and Sexual Activity  Drug Use No    Social History   Socioeconomic History   Marital status: Divorced    Spouse name: Not on file   Number of children: Not on file   Years of education: Not on file   Highest education level: Not on file  Occupational History   Not on file  Tobacco Use   Smoking status: Former    Current packs/day: 0.00    Average packs/day: 0.5 packs/day for 48.0 years (24.0 ttl pk-yrs)    Types: Cigarettes    Start date: 02/09/1962    Quit date: 02/09/2010    Years since quitting: 14.0   Smokeless tobacco: Never  Vaping Use   Vaping status: Never Used  Substance and Sexual Activity   Alcohol use: Not Currently    Comment: occ. wine maybe 1  month   Drug use: No   Sexual activity: Not on file  Other Topics Concern   Not on file  Social History Narrative   Not on file   Social Drivers of Health   Tobacco Use: Medium Risk  (02/09/2024)   Patient History    Smoking Tobacco Use: Former    Smokeless Tobacco Use: Never    Passive Exposure: Not on file  Financial Resource Strain: Patient Declined (10/15/2023)   Received from [facility]   Overall Financial Resource Strain (CARDIA)    How hard is it for you to pay for the very basics like food, housing, medical care, and heating?: Patient declined  Recent Concern: Financial Resource Strain - Medium Risk (09/23/2023)   Received from [facility]   Overall Financial Resource Strain (CARDIA)    Difficulty of Paying Living Expenses: Somewhat hard  Food Insecurity: No Food Insecurity (02/09/2024)   Epic    Worried About Running Out of Food in the Last Year: Never true    Ran Out of Food in the Last Year: Never true  Transportation Needs: No Transportation Needs (02/09/2024)   Epic    Lack of Transportation (Medical): No    Lack of Transportation (Non-Medical): No  Physical Activity: Not on file  Stress: Not on file  Social Connections: Socially Isolated (02/09/2024)   Social Connection and Isolation Panel    Frequency of Communication with Friends and Family: Three times a week    Frequency of Social Gatherings with Friends and Family: Three times a week    Attends Religious Services: Never    Active Member of Clubs  or Organizations: No    Attends Banker Meetings: Never    Marital Status: Divorced  Depression (PHQ2-9): Not on file  Alcohol Screen: Low Risk (02/09/2024)   Alcohol Screen    Last Alcohol Screening Score (AUDIT): 0  Housing: Low Risk (02/09/2024)   Epic    Unable to Pay for Housing in the Last Year: No    Number of Times Moved in the Last Year: 0    Homeless in the Last Year: No  Utilities: Not At Risk (02/09/2024)   Epic    Threatened with loss of utilities: No  Health Literacy: Not on file   Past Medical History:  Past Medical History:  Diagnosis Date   Anemia    Anxiety    Arthritis    Breast  cancer (HCC) 1995   left breast cancer - chemo and modified mastectomy   Depression    Dyspnea    Dysrhythmia    Essential thrombocytosis (HCC)    GERD (gastroesophageal reflux disease)    Heart murmur    History of hiatal hernia    Hyperlipidemia    Hyperthyroidism    Mitral valve prolapse 1997   Normal pressure hydrocephalus (HCC) 2022   shunted   Osteoporosis    Personal history of chemotherapy 1995   BREAST CA   Personal history of radiation therapy 1995   BREAST CA   Sjogren's syndrome    Vitamin D deficiency     Past Surgical History:  Procedure Laterality Date   AUGMENTATION MAMMAPLASTY Bilateral 1995   BREAST LUMPECTOMY Left 1995   COLONOSCOPY N/A 11/16/2023   Procedure: COLONOSCOPY;  Surgeon: Maryruth Ole DASEN, MD;  Location: [facility] ENDOSCOPY;  Service: Endoscopy;  Laterality: N/A;   COLONOSCOPY WITH PROPOFOL  N/A 07/16/2017   Procedure: COLONOSCOPY WITH PROPOFOL ;  Surgeon: Viktoria Lamar DASEN, MD;  Location: [facility] ENDOSCOPY;  Service: Endoscopy;  Laterality: N/A;   ESOPHAGOGASTRODUODENOSCOPY N/A 11/16/2023   Procedure: EGD (ESOPHAGOGASTRODUODENOSCOPY);  Surgeon: Maryruth Ole DASEN, MD;  Location: [facility] ENDOSCOPY;  Service: Endoscopy;  Laterality: N/A;   MASTECTOMY Left 1995   Tram flap W/IMPLANT   TONSILLECTOMY     Family History:  Family History  Problem Relation Age of Onset   Breast cancer Neg Hx     Hospital Course:  Karen [name] is a 69 year old female with a significant psychiatric history including major depressive disorder, bipolar disorder, PTSD, and anxiety, as well as multiple complex medical comorbidities, who presents to the emergency department after an episode of self-inflicted cutting to the left wrist.   On admission, on admission patient medications were adjusted to Klonopin 0.5 mg twice daily, Remeron  15 mg nightly.  Patient described mood swings Latuda  20 mg was initiated.  Patient tolerated medications fairly well with no reported side effects  patient displays improvement in her depression and anxiety.  She maintains safe behaviors on the unit and participated in groups.  Detailed risk assessment is complete based on clinical exam and individual risk factors and acute suicide risk is low and acute violence risk is low.    On the day of discharge, patient denies SI/HI/plan and denies hallucinations.  Patient remains future oriented and is willing to participate in outpatient mental health services.  Currently, all modifiable risk of harm to self/harm to others have been addressed and patient is no longer appropriate for the acute inpatient setting and is able to continue treatment for mental health needs in the community with the supports as indicated below.  Patient is educated  and verbalized understanding of discharge plan of care including medications, follow-up appointments, mental health resources and further crisis services in the community.  He is instructed to call 911 or present to the nearest emergency room should he experience any decompensation in mood, disturbance of bowel or return of suicidal/homicidal ideations.  Patient verbalizes understanding of this education and agrees to this plan of care  Physical Findings: AIMS:  , ,  ,  ,    CIWA:    COWS:      Psychiatric Specialty Exam:  Presentation  General Appearance:  Appropriate for Environment  Eye Contact: Fair  Speech: Clear and Coherent  Speech Volume: Normal    Mood and Affect  Mood: Euthymic  Affect: Appropriate   Thought Process  Thought Processes: Coherent  Descriptions of Associations:Intact  Orientation:Full (Time, Place and Person)  Thought Content:Logical  Hallucinations:Hallucinations: None  Ideas of Reference:None  Suicidal Thoughts:Suicidal Thoughts: No  Homicidal Thoughts:Homicidal Thoughts: No   Sensorium  Memory: Recent Fair  Judgment: Fair  Insight: Fair   Art Therapist   Concentration: Fair  Attention Span: Fair  Recall: [employer] of Knowledge: Fair  Language: Fair   Psychomotor Activity  Psychomotor Activity: Psychomotor Activity: Normal  Musculoskeletal: Strength & Muscle Tone: within normal limits Gait & Station: normal Assets  Assets: Manufacturing Systems Engineer; Desire for Improvement; Resilience   Sleep  Sleep: Sleep: Fair    Physical Exam: Physical Exam ROS Blood pressure 101/64, pulse (!) 106, temperature 98.2 F (36.8 C), resp. rate 16, height 5' 5 (1.651 m), weight 43.8 kg, SpO2 100%. Body mass index is 16.06 kg/m.   Tobacco Use History[1] Tobacco Cessation:  N/A, patient does not currently use tobacco products   Blood Alcohol level:  Lab Results  Component Value Date   [facility] <15 02/08/2024    Metabolic Disorder Labs:  Lab Results  Component Value Date   HGBA1C 5.6 02/10/2024   MPG 114.02 02/10/2024   No results found for: PROLACTIN Lab Results  Component Value Date   CHOL 143 02/10/2024   TRIG 74 02/10/2024   HDL 59 02/10/2024   CHOLHDL 2.4 02/10/2024   VLDL 15 02/10/2024   LDLCALC 69 02/10/2024    See Psychiatric Specialty Exam and Suicide Risk Assessment completed by Attending Physician prior to discharge.  Discharge destination:  Home  Is patient on multiple antipsychotic therapies at discharge:  No   Has Patient had three or more failed trials of antipsychotic monotherapy by history:  No  Recommended Plan for Multiple Antipsychotic Therapies: NA  Discharge Instructions     Discharge wound care:   Complete by: As directed    Wound care  Daily      Comments: Cleanse with saline, pat dry. Apply Santyl  daily to the wound bed on the top cut line, using a Q tip applicator. Do not apply any ointment to the other cuts, leaving dry. Cover the entire area with foam dressing. The foam can remain 3 days if not saturated or soiled, ok to lift and reapply   Increase activity slowly   Complete by: As  directed       Allergies as of 02/21/2024       Reactions   Escitalopram Other (See Comments)   Lansoprazole    Increased gerd        Medication List     STOP taking these medications    aspirin EC 81 MG tablet   docusate sodium 100 MG capsule Commonly known as: COLACE  NON FORMULARY   omega-3 acid ethyl esters 1 g capsule Commonly known as: LOVAZA   omeprazole 20 MG capsule Commonly known as: PRILOSEC   Probiotic-10 Chew   psyllium 58.6 % packet Commonly known as: METAMUCIL       TAKE these medications      Indication  atorvastatin  80 MG tablet Commonly known as: LIPITOR  Take 1 tablet (80 mg total) by mouth daily.  Indication: High Amount of Fats in the Blood   lurasidone  20 MG Tabs tablet Commonly known as: LATUDA  Take 1 tablet (20 mg total) by mouth daily with breakfast. Start taking on: February 22, 2024  Indication: MIXED BIPOLAR AFFECTIVE DISORDER   mirtazapine  30 MG tablet Commonly known as: REMERON  Take 0.5 tablets (15 mg total) by mouth at bedtime. What changed:  medication strength how much to take  Indication: Panic Disorder   Multivitamin Tabs Take 1 tablet by mouth daily. Start taking on: February 22, 2024    QUEtiapine  25 MG tablet Commonly known as: SEROQUEL  Take 1 tablet (25 mg total) by mouth 2 (two) times daily. What changed:  how much to take when to take this reasons to take this  Indication: Trouble Sleeping   raloxifene  60 MG tablet Commonly known as: EVISTA  Take 1 tablet (60 mg total) by mouth daily.  Indication: Osteoporosis due to Steroid Therapy   thiamine  100 MG tablet Commonly known as: VITAMIN B1 Take 1 tablet (100 mg total) by mouth daily. Start taking on: February 22, 2024  Indication: Deficiency of Vitamin B1   traZODone  150 MG tablet Commonly known as: DESYREL  Take 1 tablet (150 mg total) by mouth at bedtime.  Indication: Trouble Sleeping        Follow-up Information     [facility]. Go on 03/06/2024.   Why: Your appointment with Dr.[name] Omie is scheduled for 03/06/2024 at 8:30 AM . According to the practice, all therapy referrals must be internal, directly from your provider. Please contact your provider once discharged from the hospital or discuss it at your upcoming visit. Contact information: [address], [place], [place] [address]  [phone]        Daaleman, Evalene SQUIBB, MD. Go on 02/24/2024.   Specialty: Family Medicine Why: Your appointment is scheduled for 02/24/24 at 9:20 AM. At this appointment you will be able to discuss home health with your provider, Dr. Adrianna Pass information: [id number] [facility] [address] [phone]         [facility] Follow up.   Specialty: Urgent Care Why: You are scheduled for a virtual assessment for the Partial Hospitalization Program on Wednesday 1/14 at 10:00 am. PHP is group therapy that meets in-person Mon-Thur from 9am-2pm and Friday from 9am-1pm and lasts for approximately 2-3 weeks. This assessment appointment will last approximately 1-1.5 hours and will be virtual via MyChart. If you need to cancel or reschedule, please call [phone] Contact information: [address] [place]  [address] [phone]                Follow-up recommendations:  Activity:  as tolerated    Signed: Allyn Foil, MD 02/21/2024, 11:12 PM          [1]  Social History Tobacco Use  Smoking Status Former   Current packs/day: 0.00   Average packs/day: 0.5 packs/day for 48.0 years (24.0 ttl pk-yrs)   Types: Cigarettes   Start date: 02/09/1962  Quit date: 02/09/2010   Years since quitting: 14.0  Smokeless Tobacco Never   "

## 2024-02-23 ENCOUNTER — Ambulatory Visit (HOSPITAL_COMMUNITY)

## 2024-02-23 DIAGNOSIS — F411 Generalized anxiety disorder: Secondary | ICD-10-CM | POA: Insufficient documentation

## 2024-02-23 NOTE — Progress Notes (Signed)
 Virtual Visit via Video Note  I connected with Karen [name] on 02/23/2024 at 10:00 AM EST by a video enabled telemedicine application and verified that I am speaking with the correct person using two identifiers.  Location: Patient: pt's home in [place], [place] Provider: clinical home office in [place], [place]   I discussed the limitations of evaluation and management by telemedicine and the availability of in person appointments. The patient expressed understanding and agreed to proceed.   I discussed the assessment and treatment plan with the patient. The patient was provided an opportunity to ask questions and all were answered. The patient agreed with the plan and demonstrated an understanding of the instructions.   The patient was advised to call back or seek an in-person evaluation if the symptoms worsen or if the condition fails to improve as anticipated.  I provided 15 minutes of non-face-to-face time during this encounter.   Will LILLETTE Pollack, LCSW  Cln met with pt via Caregility per schedule. Pt's daughter was present via phone at pt's request. Cln oriented both to [facility] and pt stated she was hoping to do the virtual program, as she does not drive and is experiencing significant anxiety. Cln explained that [facility] IOP and PHP were just informed this week that [facility] Medicare (pt's payor) will no longer be covering virtual IOP. Cln advised that Medicare should provide transportation if arranged in advance. Cln also presented [facility] as an option, as they reportedly have a 3 day per week IOP. Cln shared that while [facility] provides a self-pay discount, paying for PHP out-of-pocket is usually not feasible, and due to strict Medicare guidelines, this cln is unsure if that would be an option for pt. Cln informed pt and her daughter that we are not able to provide the cost of PHP but have been told by [facility] leadership that patients can expect to be billed an estimated $1000 per  day. Pt reports this is not feasible for her. Cln apologized for the situation and assured pt that we wanted to continue providing virtual, but due to state and payor mandates we are no longer able to provide virtual PHP. Cln offered to proceed with assessment so that it will be taken care of should she and her family be able to work out transportation, but pt declined. Cln offered to provide additional resources for therapy and medication management and pt and her daughter declined, stating she has a psychiatrist and a list of [facility] referrals. Pt contracted for safety and agreed to call back should they decide to move forward with PHP or should they require additional resources. Cln provided PHP phone number: [phone] and pt's daughter read it back.

## 2024-02-24 ENCOUNTER — Inpatient Hospital Stay: Admit: 2024-02-24 | Discharge: 2024-02-26 | Payer: Medicare (Managed Care)

## 2024-02-24 ENCOUNTER — Ambulatory Visit: Admit: 2024-02-24 | Discharge: 2024-02-25 | Payer: Medicare (Managed Care)

## 2024-02-24 DIAGNOSIS — D471 Chronic myeloproliferative disease: Principal | ICD-10-CM

## 2024-02-24 DIAGNOSIS — D473 Essential (hemorrhagic) thrombocythemia: Principal | ICD-10-CM

## 2024-02-24 DIAGNOSIS — S61512S Laceration without foreign body of left wrist, sequela: Principal | ICD-10-CM

## 2024-02-24 DIAGNOSIS — F332 Major depressive disorder, recurrent severe without psychotic features: Principal | ICD-10-CM

## 2024-02-24 LAB — CBC W/ AUTO DIFF
BASOPHILS ABSOLUTE COUNT: 0 10*9/L (ref 0.0–0.1)
BASOPHILS RELATIVE PERCENT: 0.4 %
EOSINOPHILS ABSOLUTE COUNT: 0.1 10*9/L (ref 0.0–0.5)
EOSINOPHILS RELATIVE PERCENT: 0.6 %
HEMATOCRIT: 32.1 % — ABNORMAL LOW (ref 34.0–44.0)
HEMOGLOBIN: 10.7 g/dL — ABNORMAL LOW (ref 11.3–14.9)
LYMPHOCYTES ABSOLUTE COUNT: 0.6 10*9/L — ABNORMAL LOW (ref 1.1–3.6)
LYMPHOCYTES RELATIVE PERCENT: 4.6 %
MEAN CORPUSCULAR HEMOGLOBIN CONC: 33.4 g/dL (ref 32.0–36.0)
MEAN CORPUSCULAR HEMOGLOBIN: 29.7 pg (ref 25.9–32.4)
MEAN CORPUSCULAR VOLUME: 88.8 fL (ref 77.6–95.7)
MEAN PLATELET VOLUME: 7.7 fL (ref 6.8–10.7)
MONOCYTES ABSOLUTE COUNT: 1 10*9/L — ABNORMAL HIGH (ref 0.3–0.8)
MONOCYTES RELATIVE PERCENT: 8.1 %
NEUTROPHILS ABSOLUTE COUNT: 10.8 10*9/L — ABNORMAL HIGH (ref 1.8–7.8)
NEUTROPHILS RELATIVE PERCENT: 86.3 %
PLATELET COUNT: 569 10*9/L — ABNORMAL HIGH (ref 150–450)
RED BLOOD CELL COUNT: 3.62 10*12/L — ABNORMAL LOW (ref 3.95–5.13)
RED CELL DISTRIBUTION WIDTH: 14 % (ref 12.2–15.2)
WBC ADJUSTED: 12.5 10*9/L — ABNORMAL HIGH (ref 3.6–11.2)

## 2024-02-24 NOTE — Telephone Encounter (Signed)
 Hello, unfortunately [facility] cannot accept this referral as we do not currently have nursing services in [facility]. Some suggestions are [facility], [facility], [facility].  My apologies, Kristin Melvin

## 2024-02-24 NOTE — Assessment & Plan Note (Signed)
 Condition is improved  Medication and treatment plan as described in orders or med list.  Counseled on etiology, treatment, warning signs.  Provided information regarding diagnoses and treatment plans

## 2024-02-24 NOTE — Assessment & Plan Note (Signed)
 Stable based upon today's assessment.  Continue current treatment plan and follow up at least yearly.

## 2024-02-24 NOTE — Progress Notes (Signed)
 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 Previous treatments discontinued due to side effects. Monitoring required for remission and intervention needs.  - Ordered CBC with differential to monitor platelet levels.  - Will discuss with hematologist regarding potential need for further intervention based on platelet trends.    Normal pressure hydrocephalus  Worsening confusion with recent CT showing chronic subdural hematomas. Nuclear medicine study planned for shunt function assessment.  - Performed nuclear medicine study to assess shunt function.  - Held aspirin therapy due to bleeding risk associated with subdural hematomas.    Major depressive disorder, recurrent severe  Mood mixed with anxiety. Self-harm thoughts managed. Current medications include Latuda, Remeron , Seroquel , and Trazodone .  - Continue current psychiatric medications.  - Follow up with psychiatrist next week for medication management and support.    Left wrist laceration, sequela  Wound healing well without infection. Current care involves saline flushing and bandaging.  - Continue current wound care regimen with saline flushing and bandaging.  - Use distilled water and vinegar mixture to clean crusties.  - Apply Vaseline and dressing as needed.    Mixed conductive and sensorineural hearing loss, bilateral  Hearing impairment with tinnitus. Difficulty with ear wax removal. Audiologist evaluation suggested potential need for hearing aids.  - Referred to audiology for hearing evaluation and potential hearing aid fitting.  Medications prescribed or ordered upon discharge were reviewed today and reconciled with the most recent outpatient medication list.  Medication reconciliation was conducted by a prescribing practitioner, or clinical pharmacist.      I personally spent 30 minutes face-to-face and non-face-to-face in the care of this patient, which includes all pre, intra, and post visit time on the date of service.    Plan and recommendations will be forwarded to Analycia Khokhar Paul, DO  Follow-up visits include:   Future Appointments   Date Time Provider Department Center   03/02/2024 10:30 AM HBR CT RM 2 [facility] [place] [facility]   03/06/2024  8:30 AM Omie Duwaine HERO, DO [facility]   03/07/2024  9:00 AM Martyn Manuelita Maudlin, FNP [facility]   04/05/2024  9:45 AM [facility]   04/05/2024 10:40 AM [name] Meridee Naaman, MD [id number]         HISTORY OF PRESENT ILLNESS  This is a 69 y.o. female who presents for transitions/hospital follow up and to discuss the following medical problems:   Chief Complaint   Patient presents with    Weight Check    Dressing Change    Hospitalization Follow-up       Admission Date: 02/08/24  Discharge Date: 02/21/24  [facility] to her home.    Discharge Diagnoses:  Major Depressive Disorder  Suicide Attempt  Wrist laceration    Hospital Course  Jamileth Putzier is a 69 year old female with a significant psychiatric history including major depressive disorder, bipolar disorder, PTSD, and anxiety, as well as multiple complex medical comorbidities, who presents to the emergency department after an episode of self-inflicted cutting to the left wrist.    Today on interview patient reports that she feels sad about how she ended up in the hospital and putting pressure on her daughters. Patient and provider discussed at length about changing roles with aging and changing responsibilities amongst a family member to take care of her the elderly and the family. Patient expressed her understanding.  She denies SI/HI/plan and denies hallucinations. She reports being a light sleeper and her home being the best place to get some rest at night. She acknowledged and is looking forward to go for her appointments with her outpatient neurologist and psychiatrist.   Interval update:   History of Present Illness  [name] A Mahler is a 69 year old female who presents for follow-up after hospitalization.    She was recently discharged from the hospital on January 12th after a two-week stay. She experiences mixed mood with predominant anxiety and reports ongoing self-harm thoughts. She is not currently engaged with field services and is seeking a therapist for online sessions. An appointment with her psychiatrist is scheduled to manage her medications.    Her current medications include Latuda 20 mg at breakfast, Remeron  15 mg at bedtime, Seroquel  25 mg twice a day, trazodone  150 mg at bedtime, Evista  60 mg, thiamine 100 mg, omega-3 fish oil , omeprazole , psyllium, atorvastatin , and iron  three times a week. She has resumed taking baby aspirin and Colace 100 mg as needed for bowel issues. She previously stopped Pegasys , and her platelets have increased to 513.    She has a history of thrombocytopenia and myeloproliferative disorder. Her platelets were low, but recent labs show improvement. She experienced neuropathy in her feet and hands from a previous medication, which was discontinued. She is currently not on Duexis and continues to take iron  for anemia.    She is undergoing a nuclear medicine study to evaluate her shunt function due to worsening confusion and a history of normal pressure hydrocephalus. A recent CT scan showed chronic right subdural hygroma and hematoma, and she had a shunt adjustment two weeks prior. There is a concern about bleeding risk.    She has a wrist wound that is healing well. She cleans it with distilled water and vinegar, applies Vaseline, and covers it with a bandage. She is able to shower without a bandage.    She has a history of hearing impairment and tinnitus, with difficulty managing ear wax.      REVIEW OF SYSTEMS  Past medical history, medications, social history, and allergies reviewed and updated.  Remainder of systems review   OBJECTIVE  BP 106/53 (BP Position: Sitting)  - Pulse 101  - Temp 36.3 ??C (97.3 ??F) (Temporal)  - Ht 157.5 cm (5' 2.01)  - Wt 45.4 kg (100 lb)  - BMI 18.29 kg/m??    Physical Examination  Vital Signs- reviewed  General appearance - alert, well appearing, and in no distress  Mental status - alert, oriented to person, place, and time  Eyes - pupils equal and reactive, extraocular eye movements intact  Ears - bilateral TM's and external ear canals normal  Neck - supple, no significant adenopathy  Chest - clear to auscultation, no wheezes, rales or rhonchi, symmetric air entry  Heart - normal rate, regular rhythm without murmurs, rubs, clicks or gallops  Abdomen - soft, nontender, nondistended, no masses or organomegaly  Neurological - alert, oriented, normal speech, no focal findings or movement disorder noted  Extremities - no pedal edema, no clubbing or cyanosis  Skin - see Media tab          Labs, Imaging, and Other Clinical Data:  I have reviewed the labs, imaging studies, and other clinical data from this hospitalization and have followed up on any pending labs at the time of discharge.  See Epic Labs and Imaging section for details.

## 2024-02-26 ENCOUNTER — Encounter
Admission: EM | Admit: 2024-02-26 | Discharge: 2024-03-13 | Disposition: A | Discharge status: Other Acute Inpatient Hospital

## 2024-02-26 ENCOUNTER — Inpatient Hospital Stay
Admission: EM | Admit: 2024-02-26 | Discharge: 2024-03-13 | Disposition: A | Discharge status: Other Acute Inpatient Hospital | Payer: Medicare (Managed Care)

## 2024-02-26 ENCOUNTER — Ambulatory Visit: Admit: 2024-02-26

## 2024-02-26 ENCOUNTER — Ambulatory Visit: Admit: 2024-02-26 | Payer: Medicare (Managed Care)

## 2024-02-26 ENCOUNTER — Inpatient Hospital Stay: Admit: 2024-02-26 | Discharge: 2024-03-13

## 2024-02-26 ENCOUNTER — Ambulatory Visit
Admission: EM | Admit: 2024-02-26 | Discharge: 2024-03-13 | Disposition: A | Discharge status: Other Acute Inpatient Hospital | Payer: Medicare (Managed Care)

## 2024-02-26 ENCOUNTER — Ambulatory Visit
Admission: EM | Admit: 2024-02-26 | Discharge: 2024-03-13 | Disposition: A | Discharge status: Other Acute Inpatient Hospital

## 2024-02-26 LAB — URINALYSIS WITH MICROSCOPY
BILIRUBIN UA: NEGATIVE
BLOOD UA: NEGATIVE
GLUCOSE UA: NEGATIVE
KETONES UA: NEGATIVE
LEUKOCYTE ESTERASE UA: NEGATIVE
NITRITE UA: NEGATIVE
PH UA: 5 (ref 5.0–9.0)
PROTEIN UA: NEGATIVE
RBC UA: <1 /HPF (ref <=4)
SPECIFIC GRAVITY UA: 1.008 (ref 1.003–1.030)
SQUAMOUS EPITHELIAL: 1 /HPF (ref 0–5)
UROBILINOGEN UA: <2
WBC UA: <1 /HPF (ref 0–5)

## 2024-02-26 LAB — CBC W/ AUTO DIFF
BASOPHILS ABSOLUTE COUNT: 0.1 10*9/L (ref 0.0–0.1)
BASOPHILS RELATIVE PERCENT: 1 %
EOSINOPHILS ABSOLUTE COUNT: 0.1 10*9/L (ref 0.0–0.5)
EOSINOPHILS RELATIVE PERCENT: 1.2 %
HEMATOCRIT: 33 % — ABNORMAL LOW (ref 34.0–44.0)
HEMOGLOBIN: 11.1 g/dL — ABNORMAL LOW (ref 11.3–14.9)
LYMPHOCYTES ABSOLUTE COUNT: 1.5 10*9/L (ref 1.1–3.6)
LYMPHOCYTES RELATIVE PERCENT: 13.2 %
MEAN CORPUSCULAR HEMOGLOBIN CONC: 33.7 g/dL (ref 32.0–36.0)
MEAN CORPUSCULAR HEMOGLOBIN: 29.4 pg (ref 25.9–32.4)
MEAN CORPUSCULAR VOLUME: 87.3 fL (ref 77.6–95.7)
MEAN PLATELET VOLUME: 7.5 fL (ref 6.8–10.7)
MONOCYTES ABSOLUTE COUNT: 1.1 10*9/L — ABNORMAL HIGH (ref 0.3–0.8)
MONOCYTES RELATIVE PERCENT: 9.6 %
NEUTROPHILS ABSOLUTE COUNT: 8.3 10*9/L — ABNORMAL HIGH (ref 1.8–7.8)
NEUTROPHILS RELATIVE PERCENT: 75 %
PLATELET COUNT: 636 10*9/L — ABNORMAL HIGH (ref 150–450)
RED BLOOD CELL COUNT: 3.78 10*12/L — ABNORMAL LOW (ref 3.95–5.13)
RED CELL DISTRIBUTION WIDTH: 13.8 % (ref 12.2–15.2)
WBC ADJUSTED: 11.1 10*9/L (ref 3.6–11.2)

## 2024-02-26 LAB — COMPREHENSIVE METABOLIC PANEL
ALBUMIN: 3.3 g/dL — ABNORMAL LOW (ref 3.4–5.0)
ALKALINE PHOSPHATASE: 101 U/L (ref 46–116)
ALT (SGPT): 26 U/L (ref 10–49)
ANION GAP: 14 mmol/L (ref 5–14)
AST (SGOT): 21 U/L (ref <=34)
BILIRUBIN TOTAL: 0.4 mg/dL (ref 0.3–1.2)
BLOOD UREA NITROGEN: 21 mg/dL (ref 9–23)
BUN / CREAT RATIO: 30
CALCIUM: 9.5 mg/dL (ref 8.7–10.4)
CHLORIDE: 102 mmol/L (ref 98–107)
CO2: 26 mmol/L (ref 20.0–31.0)
CREATININE: 0.71 mg/dL (ref 0.55–1.02)
EGFR CKD-EPI (2021) FEMALE: >90 mL/min/1.73m2 (ref >=60)
GLUCOSE RANDOM: 120 mg/dL (ref 70–179)
POTASSIUM: 3.7 mmol/L (ref 3.4–4.8)
PROTEIN TOTAL: 7.1 g/dL (ref 5.7–8.2)
SODIUM: 142 mmol/L (ref 135–145)

## 2024-02-26 LAB — TOXICOLOGY SCREEN, URINE
AMPHETAMINE SCREEN URINE: NEGATIVE
BARBITURATE SCREEN URINE: NEGATIVE
BENZODIAZEPINE SCREEN, URINE: NEGATIVE
BUPRENORPHINE, URINE SCREEN: NEGATIVE
CANNABINOID SCREEN URINE: NEGATIVE
COCAINE(METAB.)SCREEN, URINE: NEGATIVE
FENTANYL SCREEN, URINE: NEGATIVE
METHADONE SCREEN, URINE: NEGATIVE
OPIATE SCREEN URINE: NEGATIVE
OXYCODONE SCREEN URINE: NEGATIVE

## 2024-02-26 LAB — ETHANOL: ETHANOL: <10 mg/dL (ref <=10)

## 2024-02-26 LAB — TSH: THYROID STIMULATING HORMONE: 1.658 u[IU]/mL (ref 0.550–4.780)

## 2024-02-26 NOTE — Progress Notes (Signed)
 [facility]    Psychiatry Emergency Service  Psychiatric Screening Exam       Service Date: February 26, 2024  Arrival Date/Time: 02/26/2024  5:00 PM  Location of patient: ED  Provider: Thurmon LITTIE Hammersmith, LCSW     CC: Anxiety, Depression, Safety evaluation, and Suicidal Ideation    TIME SPENT:  Patient evaluation:    INTERVENTIONS: risk assessment, supportive psychotherapy, psychoeducation, crisis counseling, and reality testing       IVC on Arrival: No    ATTEMPT SUICIDE/SELF-HARM PTA:  DENIES    ACTIVE SUICIDAL IDEATION: YES, reports having SI x2 days     RECENT SUICIDAL IDEATION:  -Ideation: YES, as per above  -Attempt: DENIES  -Aborted/interrupted attempt: DENIES  -Research: DENIES  -Note written: DENIES    ACTIVE/RECENT HOMICIDAL IDEATION:  DENIES    ACTIVE/RECENT AGGRESSION: DENIES    DELUSIONS: DENIES    HALLUCINATIONS: DENIES     DRUG/ALCOHOL USE:   -Appears intoxicated:No  -Recently using: DENIES       BRIEF HPI: 69 y/o with hx of MDD, Anxiety, Bipolar and multiple medical issues presenting with private duty care giver after pt told daughter that she was uncertain that she could be safe once the pt caregiver left. Pt recently admitted for inpt psych at [facility] due to attempt in which she cut deeply to her  left wrist in Dec (see media for images). Pt reports having regret that attempt was unsuccessful. Currently endorsing severe apathy towards living, stating that she tired to kill herself by cutting her wrist and that it didn't work and so she doesn't have a plan on how else she could be success at hurting herself. Pt is retired programmer, multimedia.          Mental Status Exam:  Appearance:    Appears stated age, Clean/Neat, and Malnourished   Behavior:   Calm, Cooperative, Polite, and Litmited to no eye contact   Motor:   No abnormal movements   Speech/Language:    Language intact, well formed   Mood:   Depressed   Affect:   Constricted, Decreased range, and Sad   Thought process:   Logical, linear, clear, coherent, goal directed   Thought content:     +Passive SI   Perceptual disturbances:     Denies auditory and visual hallucinations, behavior not concerning for response to internal stimuli     Orientation:   Oriented to person, place, time, and general circumstances   Attention:   Able to fully attend without fluctuations in consciousness   Concentration:   Able to fully concentrate and attend   Memory:   Immediate, short-term, long-term, and recall grossly intact    Fund of knowledge:    Consistent with level of education and development   Insight:     Impaired   Judgment:    Fair   Impulse Control:   Fair       MEDICATIONS:  Current Medications[1]          COLLATERAL  Dtr, [name] [phone])  Duwaine Brandy [phone])    ASSESSMENT AND PLAN    Based on my review of the chart, conversation with the patient and EM provider, I recommend FULL PES EXAM due to severity of depression and concern for safety. .       Legal Status: Pts current legal status is: voluntary; but would initiate IVC if patient/guardian were to request discharge prior to completion of full exam.     Diagnoses:  Active Problems:    * No active hospital problems. *      -- Observation Level:   This patient had recent ASQ screening performed that rated them as     Based on my clinical evaluation, I estimate the patient to be at low risk for suicide in the current setting.  At this time we recommend Q 15 Minute Obs.   This decision is based on my review of the chart including patient's history and current presentation, interview of the patient, mental status examination, and consideration of suicide risk including evaluating suicidal ideation, plan, intent, suicidal or self-harm behaviors, risk factors, and protective factors. This will be reassessed if there is clinically significant change in the status of the patient. This judgment is based on our ability to directly address suicide risk, implement suicide prevention strategies and develop a safety plan while the patient is in the clinical setting.     Findings of the psychiatric screening exam were discussed with EM provider Dena Later, FNP . EM provider is in agreement with screening exam recommendations.                 [1]   No current facility-administered medications for this encounter.    Current Outpatient Medications:     acetaminophen  (TYLENOL ) 325 MG tablet, Take 2 tablets (650 mg total) by mouth every four (4) hours as needed for fever (temp >38.5C)., Disp: , Rfl: 0    atorvastatin  (LIPITOR ) 80 MG tablet, Take 1 tablet (80 mg total) by mouth daily., Disp: 90 tablet, Rfl: 3    divalproex (DEPAKOTE) 125 MG DR tablet, , Disp: , Rfl:     ferrous sulfate  325 (65 FE) MG EC tablet, Take 1 tablet (325 mg total) by mouth 3 (three) times a week., Disp: 30 tablet, Rfl: 3    lurasidone  (LATUDA ) 20 mg Tab, Take 1 tablet (20 mg total) by mouth., Disp: , Rfl:     mirtazapine  (REMERON ) 30 MG tablet, , Disp: , Rfl:     omega-3 fatty acids -fish oil  300-1,000 mg cap capsule, Take 1 capsule by mouth in the morning. (Patient not taking: Reported on 01/31/2024), Disp: , Rfl:     omeprazole  (PRILOSEC) 20 MG capsule, Take 1 capsule (20 mg total) by mouth daily., Disp: 90 capsule, Rfl: 3    raloxifene  (EVISTA ) 60 mg tablet, TAKE 1 TABLET DAILY, Disp: 90 tablet, Rfl: 3    simethicone (MYLICON) 80 MG chewable tablet, Chew 1 tablet (80 mg total) every six (6) hours as needed for flatulence., Disp: , Rfl:     traZODone  (DESYREL ) 150 MG tablet, Take 1 tablet (150 mg total) by mouth nightly., Disp: 30 tablet, Rfl: 2    vitamins B1 B6 B12 Liqd, Take by mouth every other day., Disp: , Rfl:

## 2024-02-26 NOTE — Progress Notes (Addendum)
 Psychiatric Emergency Service  Dept. of Emergency Medicine  Progress Note      Service Date:  February 26, 2024    Daughter Christy Buchanan ([phone])     Approximately 2 weeks ago, her daughter reports that she attempted to end her life by cutting her wrists, which resulted in a 2-week inpatient hospitalization at [facility]. Since her discharge on Monday, her daughter describes ongoing concerns about her safety due to continued suicidal thoughts and significant functional decline. According to her daughter, she was not fully forthcoming about her suicidal thoughts prior to her recent attempt, and her daughter expresses concern that she may not be forthright about her current level of risk.  She experienced her first suicide attempt.     Her daughter describes her anxiety as absolutely debilitating, noting that symptoms have worsened over the past several months. The anxiety appears to exacerbate cognitive symptoms, particularly in the evenings, with episodes of confusion and agitation resembling sundowning. Her daughter reports that she has struggled to sleep and that her overall functioning has significantly declined, requiring daily support for medication management and prompting a recent referral for home health services.    Regarding her current medication regimen, her daughter reports that Latuda  was reintroduced during her recent hospitalization, as she had previously responded well to it despite some side effects. However, she has not experienced the same benefit this time, and her anxiety remains severe. The care team also increased her Seroquel  to both morning and evening doses and titrated her trazodone  up to 150 mg to address sleep difficulties, but her daughter reports that these interventions have not provided meaningful relief. Her daughter confirms that she has been overseeing her medication administration daily to ensure adherence.    Her daughter notes that she lacks a regular therapist or support network and is resistant to engaging in therapy, possibly due to her own professional background as a therapist and concerns about being judged. Her daughter expresses significant concern about her ability to continue living independently due to ongoing safety concerns and functional decline.    Psychiatric History: She previously trialed Latuda  with good effect but discontinued it due to side effects.    Social History: She previously worked as a paramedic. She lives independently with two cats. Her daughter is her primary support and lives about 20 minutes away. The family is considering placement or assisted living as she may not be able to live alone safely.    Approximately 2 weeks ago, her daughter reports that she attempted to end her life by cutting her wrists, which resulted in a 2-week inpatient hospitalization at [facility]. Since her discharge on Monday, her daughter describes ongoing concerns about her safety due to continued suicidal thoughts and significant functional decline. According to her daughter, she was not fully forthcoming about her suicidal thoughts prior to her recent attempt, and her daughter expresses concern that she may not be forthright about her current level of risk.    Her daughter describes her anxiety as absolutely debilitating, noting that symptoms have worsened over the past several months. The anxiety appears to exacerbate cognitive symptoms, particularly in the evenings, with episodes of confusion and agitation resembling sundowning. Her daughter reports that she has struggled to sleep and that her overall functioning has significantly declined, requiring daily support for medication management and prompting a recent referral for home health services.    Regarding her current medication regimen, her daughter reports that Latuda  was reintroduced during her recent hospitalization, as she  had previously responded well to it despite some side effects. However, she has not experienced the same benefit this time, and her anxiety remains severe. The care team also increased her Seroquel  to both morning and evening doses and titrated her trazodone  up to 150 mg to address sleep difficulties, but her daughter reports that these interventions have not provided meaningful relief. Her daughter confirms that she has been overseeing her medication administration daily to ensure adherence.    Her daughter notes that she lacks a regular therapist or support network and is resistant to engaging in therapy, possibly due to her own professional background as a therapist and concerns about being judged. Her daughter expresses significant concern about her ability to continue living independently due to ongoing safety concerns and functional decline.    She lives independently with two cats. Daughter is her primary support and lives about 20 minutes away. The family is considering placement or assisted living as she may not be able to live alone safely. Other daughter Christy Buchanan [phone]) does not live in [place].        Olam Alas LCSW

## 2024-02-26 NOTE — ED Triage Note (Signed)
 Arrives with caregiver for SI.

## 2024-02-26 NOTE — ED Progress Note (Signed)
 Received sign out from previous provider.    Patient Summary: Christy Buchanan is a 69 y.o. female with PMH most significant for sjogren's disease, breast cancer, hyperlipidemia, thrombocythemia, autoimmune thyroiditis, GERD, normal pressure hydrocephalus s/p ventriculoperitoneal shunt, osteoporosis, bipolar 2 disorder, and mild cognitive impairment (due to memory loss) who presents with suicidal ideations related to worsening confusion and medical decline.  She was recently discharged from outside hospital on 02/21/2024 in setting of prior suicide attempt.  She has a VP shunt and known chronic bilateral subdural hematomas.  There is concern that the bilateral subdural hematomas are worse compared to prior, pending outside imaging.  Action List:   Pending outside imaging for direct comparison of bilateral subdural hematomas as well as completion of XR shunt series.  If these are negative, patient will be medically cleared, though if worsening subdural hematomas, will discuss with neurosurgery.    Updates  ED Course as of 02/27/24 0013   Sat Feb 26, 2024   2237 IMPRESSION:  Radiographically intact sophysa VP shunt programmed to position 5.     [place] Feb 27, 2024   0012 I have discussed with our radiology colleagues, they have been able to compare the subdural hematomas to the prior imaging obtained at the end of December and report that they look similar to prior, as such, I do not believe that the subdural hematomas represent an acute worsening that would require emergent evaluation by neurosurgery here in the emergency department.  Patient is considered medically cleared.

## 2024-02-26 NOTE — ED Triage Note (Signed)
 Patient arrives with caregiver for SI. Patient is tearful and reports worsening confusion and states she cannot manage anymore. Cut her left wrist 3 weeks ago as a suicide attempt. Denies HI/AVH.

## 2024-02-26 NOTE — ED Provider Notes (Signed)
 [facility]  Emergency Department Provider Note     ED Clinical Impression     Final diagnoses:   Suicidal ideations (Primary)      Impression, Medical Decision Making, ED Course     Impression: 69 y.o. female with PMH most significant for sjogren's disease, breast cancer, hyperlipidemia, thrombocythemia, autoimmune thyroiditis, GERD, normal pressure hydrocephalus s/p ventriculoperitoneal shunt, osteoporosis, bipolar 2 disorder, and mild cognitive impairment (due to memory loss) who presents with suicidal ideations related to worsening confusion and medical decline.    Upon physical examination, the patient appears well and nontoxic.  Her triage vital signs are unremarkable.  The patient appears sullen but, cooperative with staff.  GCS is 15.  Her pupils are +2, equal, round, and reactive to light.  Her lungs are clear and equal bilaterally with a regular cardiac rate and rhythm.  Her abdomen is soft, nondistended, and nontender to palpation with normoactive bowel sounds.    Upon chart review, the patient was seen and evaluated at [facility] on 02/08/2024 for an episode of self-harm.  The patient was feeling overwhelmed due to multiple stressors, including current medical conditions and intentionally cut her self on the left wrist as an act to harm herself.  She was admitted for psychiatric evaluation and discharged on 02/21/2024.  She is currently taking Latuda , Remeron , Seroquel , and trazodone  for her major depressive disorder.    This patient presents with symptoms consistent with an underlying psychiatric disorder, most likely worsening depression. Given the history and physical examination, this patient is suicidal and will require psychiatric care. Psychiatry will be consulted to evaluate the patient. Laboratory studies will be obtained for medical clearance.     1915 Case discussed with Dr. Jerrilyn who recommends CT head w/o contract and XR shunt series to rule out shunt malfunction. 1927 ECG normal sinus rhythm with a ventricular rate of 93 bpm and QT interval of 465.  No ST depression or elevation noted.  No previous ECGs available for comparison.      1940 Hemoglobin and hematocrit levels improved from prior draw on 02/24/2024.  Remaining laboratory studies and urinalysis unremarkable.    2008 CT head without contrast revealed bilateral chronic appearing subdural collections.  When compared to previous report from 02/09/2024, radiology reports the collections may appear larger.  Request sent to [facility] to send original imaging of CT head without contrast to [facility] for radiology review and comparison.    2055 Hand-off report given to Dr. Jerrilyn pending CT imaging comparison of subdural hematomas and XR shunt series for medical clearance for behavioral health.     Diagnostic workup as below.    Orders Placed This Encounter   Procedures    Influenza/ RSV/COVID PCR    XR Chest 2 views    CT head WO contrast    XR Shunt Series    Urinalysis with Microscopy    TSH    Ethanol    Comprehensive Metabolic Panel    CBC w/ Differential    Toxicology Screen, Urine    Nutrition Therapy Regular/House; Safety Tray    Level of Supervision    Elopement risk    Place Patient in ED Psych    Inpatient consult to Psychiatry    ECG 12 Lead         MDM Elements    I directly visualized and independently interpreted the EKG tracing.   I reviewed the patient's prior medical records [facility] encounter 02/08/2024 and 02/09/2024).  History     Chief Complaint  Chief Complaint   Patient presents with    Suicidal       HPI   Christy Buchanan is a 69 y.o. female with past medical history as below who presents with suicidal ideations related to worsening confusion and medical decline.  She was seen, evaluated, and admitted to behavioral health on 02/08/2024 for similar complaints with self-inflicted left lateral and horizontal wrist lacerations (see media tab for previous photodocumentation).  She was discharged on 02/21/2024.    She followed up with her primary care provider on 02/24/2024, who ordered a nuclear medicine study for shunt function assessment.  She reports that she was unable to make it to this appointment due to her increased anxiety of completing assessment due to duration of time it takes to complete.  CT imaging completed on 02/08/2024 showed chronic subdural hematomas.  Primary care provider is holding her 81 mg aspirin therapy due to bleeding risk associated with subdural hematoma findings.    Review of Systems    A complete review of systems was performed and is negative other than as addressed in the HPI.      Past Medical History[1]    Past Surgical History[2]    Active Medications[3]     Allergies[4]    Family History[5]    Short Social History[6]     Physical Exam     ED Triage Vitals   Enc Vitals Group      BP 02/26/24 1700 115/92      Pulse 02/26/24 1659 100      SpO2 Pulse --       Resp 02/26/24 1700 18      Temp 02/26/24 1700 36.5 ??C (97.7 ??F)      Temp Source 02/26/24 1700 Oral      SpO2 02/26/24 1659 97 %      Weight --       Height --       Head Circumference --       Peak Flow --       Pain Score --       Pain Loc --       Pain Education --       Exclude from Growth Chart --        Constitutional: Alert and oriented. Well appearing and in no distress.  Eyes: Conjunctivae are normal.  ENT       Head: Normocephalic and atraumatic.       Mouth/Throat: Mucous membranes are moist.       Neck: Supple  Hematological/Lymphatic/Immunilogical: No cervical lymphadenopathy.  Cardiovascular: Normal rate, regular rhythm.   Respiratory: Normal respiratory effort. Breath sounds are normal.  Gastrointestinal: Soft and nontender. There is no CVA tenderness.  Musculoskeletal: Nontender with normal range of motion in all extremities.       Right lower leg: No tenderness or edema.       Left lower leg: No tenderness or edema.  Neurologic: Normal speech and language. No gross focal neurologic deficits are appreciated.  Skin: Skin is warm, dry and intact. No rash noted. see media tab for previous photodocumentation of healing left anterior wrist lacerations.  No evidence of infection noted.  No discharge noted.  Psychiatric: Mood and affect are normal. Speech and behavior are normal.      EKG     ECG normal sinus rhythm with a ventricular rate of 93 bpm and QT interval of 465.  No ST depression or  elevation noted.  No previous ECGs available for comparison.    Radiology     CT head WO contrast   Final Result   Bilateral chronic appearing subdural collections in the presence of shunted ventricles. Comparing to the previous report from 02/09/2024 it would seem the collections may be larger. If those prior images could be provided we can make a direct comparison.      Ventricular shunt catheter positioning as described above without evidence of ventricular enlargement.             XR Chest 2 views    (Results Pending)   XR Shunt Series    (Results Pending)       Labs     Labs Reviewed   URINALYSIS WITH MICROSCOPY - Abnormal; Notable for the following components:       Result Value    Bacteria, UA Rare (*)     Mucus, UA Rare (*)     All other components within normal limits   COMPREHENSIVE METABOLIC PANEL - Abnormal; Notable for the following components:    Albumin 3.3 (*)     All other components within normal limits   CBC W/ AUTO DIFF - Abnormal; Notable for the following components:    RBC 3.78 (*)     HGB 11.1 (*)     HCT 33.0 (*)     Platelet 636 (*)     Absolute Neutrophils 8.3 (*)     Absolute Monocytes 1.1 (*)     All other components within normal limits   INFLUENZA/RSV/COVID PCR - Normal    Narrative:     This test was performed using the FDA-cleared Cepheid Xpert Xpress CoV-2/Flu/RSV plus assay. Performance characteristics have been verified by the CLIA-certified, CAP-inspected [employer]   TSH - Normal   ETHANOL - Normal    Narrative:     Testing for medical purposes only.   TOXICOLOGY SCREEN, URINE - Normal    Narrative:     The positive screening test(s)detected drugs belonging to the indicated class or another similar substance. Confirmation of positive screening results is available on request. These results should be used only for medical (i.e., treatment) purposes.   CBC W/ DIFFERENTIAL    Narrative:     The following orders were created for panel order CBC w/ Differential.  Procedure                               Abnormality         Status                     ---------                               -----------         ------                     CBC w/ Differential[[id number]]         Abnormal            Final result                 Please view results for these tests on the individual orders.         Pertinent labs & imaging results that were available during my care of the patient were reviewed  by me and considered in my medical decision making (see chart for details).    Portions of this record have been created using [employer], clinical (histocompatibility and immunogenetics). Dictation errors have been sought, but may not have been identified and corrected.           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    Placed 11.2023  [place]  150 is setting 11.2023    Vitamin D deficiency    [2]   Past Surgical History:  Procedure Laterality Date    AUGMENTATION MAMMAPLASTY Bilateral     24ish yrs    BREAST BIOPSY Left     malignant    CHEMOTHERAPY      MASTECTOMY Left 25 ish yrs ago    PR CREATE SHUNT:VENTRIC-PERITONEAL Right 12/30/2021    Procedure: CREAT SHUNT; VENTRICULO-PERITONEAL/PLEURAL;  Surgeon: Kari Donnice Gaster, MD;  Location: MAIN OR [facility];  Service: Neurosurgery    PR LAP, SURG ENTEROLYSIS Right 12/30/2021    Procedure: LAPAROSCOPY, SURGICAL, ENTEROLYSIS (FREEING OF INTESTINAL ADHESION) (SEPARATE PROCEDURE);  Surgeon: Carlin Curtistine Route, MD;  Location: MAIN OR [facility];  Service: Trauma    RADIATION Left    [3]   No current facility-administered medications for this encounter.     Current Outpatient Medications   Medication Sig Dispense Refill    acetaminophen  (TYLENOL ) 325 MG tablet Take 2 tablets (650 mg total) by mouth every four (4) hours as needed for fever (temp >38.5C).  0    atorvastatin  (LIPITOR ) 80 MG tablet Take 1 tablet (80 mg total) by mouth daily. 90 tablet 3    divalproex (DEPAKOTE) 125 MG DR tablet       ferrous sulfate  325 (65 FE) MG EC tablet Take 1 tablet (325 mg total) by mouth 3 (three) times a week. 30 tablet 3    lurasidone  (LATUDA ) 20 mg Tab Take 1 tablet (20 mg total) by mouth.      mirtazapine  (REMERON ) 30 MG tablet       omega-3 fatty acids -fish oil  300-1,000 mg cap capsule Take 1 capsule by mouth in the morning. (Patient not taking: Reported on 01/31/2024)      omeprazole  (PRILOSEC) 20 MG capsule Take 1 capsule (20 mg total) by mouth daily. 90 capsule 3    raloxifene  (EVISTA ) 60 mg tablet TAKE 1 TABLET DAILY 90 tablet 3    simethicone (MYLICON) 80 MG chewable tablet Chew 1 tablet (80 mg total) every six (6) hours as needed for flatulence.      traZODone  (DESYREL ) 150 MG tablet Take 1 tablet (150 mg total) by mouth nightly. 30 tablet 2    vitamins B1 B6 B12 Liqd Take by mouth every other day.     [4]   Allergies  Allergen Reactions    Escitalopram  Other (See Comments)   [5]   Family History  Problem Relation Age of Onset    Cancer Mother     Heart disease Mother     Colon cancer Father     Cancer Father     No Known Problems Sister  No Known Problems Brother     BRCA 1/2 Daughter     BRCA 1/2 Daughter     BRCA 1/2 Daughter     No Known Problems Son     No Known Problems Maternal Grandmother     No Known Problems Maternal Grandfather     No Known Problems Paternal Grandmother     No Known Problems Paternal Grandfather     No Known Problems Other     Breast cancer Neg Hx     Endometrial cancer Neg Hx     Ovarian cancer Neg Hx    [6]   Social History  Tobacco Use    Smoking status: Former     Types: Cigarettes     Passive exposure: Never    Smokeless tobacco: Never   Vaping Use    Vaping status: Never Used   Substance Use Topics    Alcohol use: Not Currently     Comment: rarely    Drug use: Not Currently        Arty Dena Garre, FNP  02/26/24 2057

## 2024-02-26 NOTE — Consults (Signed)
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    Alkaline Phosphatase 101 46 - 116 U/L   CBC w/ Differential    Collection Time: 02/26/24  6:45 PM   Result Value Ref Range    WBC 11.1 3.6 - 11.2 10*9/L    RBC 3.78 (L) 3.95 - 5.13 10*12/L    HGB 11.1 (L) 11.3 - 14.9 g/dL    HCT 66.9 (L) 65.9 - 44.0 %    MCV 87.3 77.6 - 95.7 fL    MCH 29.4 25.9 - 32.4 pg    MCHC 33.7 32.0 - 36.0 g/dL    RDW 86.1 87.7 - 84.7 %    MPV 7.5 6.8 - 10.7 fL    Platelet 636 (H) 150 - 450 10*9/L    Neutrophils % 75.0 %    Lymphocytes % 13.2 %    Monocytes % 9.6 %    Eosinophils % 1.2 %    Basophils % 1.0 %    Absolute Neutrophils 8.3 (H) 1.8 - 7.8 10*9/L    Absolute Lymphocytes 1.5 1.1 - 3.6 10*9/L    Absolute Monocytes 1.1 (H) 0.3 - 0.8 10*9/L    Absolute Eosinophils 0.1 0.0 - 0.5 10*9/L    Absolute Basophils 0.1 0.0 - 0.1 10*9/L       Labs reviewed, unremarkable with exception of: abnormal CBC w/differentials     EKG: was completed on 02/26/2023    Imaging: None        ++Safety Note: All patients awaiting psychiatric evaluation and/or disposition in the ED shall be placed in a locked behavioral health area as space permits. When in this setting they will be monitored via live video feed++

## 2024-02-26 NOTE — Discharge Summary (Signed)
 "  Physician Discharge Summary Note   Patient:  Karen [name] is an 69 y.o., female MRN:  [id number] DOB:  [birth year] Patient phone:  [phone] (home)          Patient address:   [address] [place] [address],       Total time spent: 40 min Date of Admission:  02/09/2024 Date of Discharge: 02/21/24   Reason for Admission:  Karen [name] is a 68 year old female with a significant psychiatric history including major depressive disorder, bipolar disorder, PTSD, and anxiety, as well as multiple complex medical comorbidities, who presents to the emergency department after an episode of self-inflicted cutting to the left wrist.    Principal Problem: <principal problem not specified> Discharge Diagnoses: Active Problems:   MDD (major depressive disorder), recurrent severe, without psychosis (HCC)     Past Psychiatric History: see h&p   Family Psychiatric  History: see h&p Social History:  Social History        Substance and Sexual Activity  Alcohol Use Not Currently    Comment: occ. wine maybe 1  month     Social History       Substance and Sexual Activity  Drug Use No    Social History         Socioeconomic History   Marital status: Divorced      Spouse name: Not on file   Number of children: Not on file   Years of education: Not on file   Highest education level: Not on file  Occupational History   Not on file  Tobacco Use   Smoking status: Former      Current packs/day: 0.00      Average packs/day: 0.5 packs/day for 48.0 years (24.0 ttl pk-yrs)      Types: Cigarettes      Start date: 02/09/1962      Quit date: 02/09/2010      Years since quitting: 14.0   Smokeless tobacco: Never  Vaping Use   Vaping status: Never Used  Substance and Sexual Activity   Alcohol use: Not Currently      Comment: occ. wine maybe 1  month   Drug use: No   Sexual activity: Not on file  Other Topics Concern   Not on file  Social History Narrative   Not on file     Social Drivers of Health        Tobacco Use: Medium Risk (02/09/2024)    Patient History     Smoking Tobacco Use: Former     Smokeless Tobacco Use: Never     Passive Exposure: Not on file  Financial Resource Strain: Patient Declined (10/15/2023)    Received from [facility]    Overall Financial Resource Strain (CARDIA)     How hard is it for you to pay for the very basics like food, housing, medical care, and heating?: Patient declined  Recent Concern: Financial Resource Strain - Medium Risk (09/23/2023)    Received from [facility]    Overall Financial Resource Strain (CARDIA)     Difficulty of Paying Living Expenses: Somewhat hard  Food Insecurity: No Food Insecurity (02/09/2024)    Epic     Worried About Running Out of Food in the Last Year: Never true     Ran Out of Food in the Last Year: Never true  Transportation Needs: No Transportation Needs (02/09/2024)    Epic     Lack of Transportation (Medical): No  Lack of Transportation (Non-Medical): No  Physical Activity: Not on file  Stress: Not on file  Social Connections: Socially Isolated (02/09/2024)    Social Connection and Isolation Panel     Frequency of Communication with Friends and Family: Three times a week     Frequency of Social Gatherings with Friends and Family: Three times a week     Attends Religious Services: Never     Active Member of Clubs or Organizations: No     Attends Banker Meetings: Never     Marital Status: Divorced  Depression (PHQ2-9): Not on file  Alcohol Screen: Low Risk (02/09/2024)    Alcohol Screen     Last Alcohol Screening Score (AUDIT): 0  Housing: Low Risk (02/09/2024)    Epic     Unable to Pay for Housing in the Last Year: No     Number of Times Moved in the Last Year: 0     Homeless in the Last Year: No  Utilities: Not At Risk (02/09/2024)    Epic     Threatened with loss of utilities: No  Health Literacy: Not on file    Past Medical History:       Past Medical History:  Diagnosis Date   Anemia     Anxiety     Arthritis     Breast cancer (HCC) 1995    left breast cancer - chemo and modified mastectomy   Depression     Dyspnea     Dysrhythmia     Essential thrombocytosis (HCC)     GERD (gastroesophageal reflux disease)     Heart murmur     History of hiatal hernia     Hyperlipidemia     Hyperthyroidism     Mitral valve prolapse 1997   Normal pressure hydrocephalus (HCC) 2022    shunted   Osteoporosis     Personal history of chemotherapy 1995    BREAST CA   Personal history of radiation therapy 1995    BREAST CA   Sjogren's syndrome     Vitamin D deficiency               Past Surgical History:  Procedure Laterality Date   AUGMENTATION MAMMAPLASTY Bilateral 1995   BREAST LUMPECTOMY Left 1995   COLONOSCOPY N/A 11/16/2023    Procedure: COLONOSCOPY;  Surgeon: Maryruth Ole DASEN, MD;  Location: [facility] ENDOSCOPY;  Service: Endoscopy;  Laterality: N/A;   COLONOSCOPY WITH PROPOFOL  N/A 07/16/2017    Procedure: COLONOSCOPY WITH PROPOFOL ;  Surgeon: Viktoria Lamar DASEN, MD;  Location: [facility] ENDOSCOPY;  Service: Endoscopy;  Laterality: N/A;   ESOPHAGOGASTRODUODENOSCOPY N/A 11/16/2023    Procedure: EGD (ESOPHAGOGASTRODUODENOSCOPY);  Surgeon: Maryruth Ole DASEN, MD;  Location: [facility] ENDOSCOPY;  Service: Endoscopy;  Laterality: N/A;   MASTECTOMY Left 1995    Tram flap W/IMPLANT   TONSILLECTOMY            Family History:       Family History  Problem Relation Age of Onset   Breast cancer Neg Hx            Hospital Course:  Karen [name] is a 69 year old female with a significant psychiatric history including major depressive disorder, bipolar disorder, PTSD, and anxiety, as well as multiple complex medical comorbidities, who presents to the emergency department after an episode of self-inflicted cutting to the left wrist.    On admission, on admission patient medications were adjusted to Klonopin 0.5 mg twice daily,  Remeron  15 mg nightly.  Patient described mood swings Latuda  20 mg was initiated.  Patient tolerated medications fairly well with no reported side effects patient displays improvement in her depression and anxiety.  She maintains safe behaviors on the unit and participated in groups.   Detailed risk assessment is complete based on clinical exam and individual risk factors and acute suicide risk is low and acute violence risk is low.     On the day of discharge, patient denies SI/HI/plan and denies hallucinations.  Patient remains future oriented and is willing to participate in outpatient mental health services.   Currently, all modifiable risk of harm to self/harm to others have been addressed and patient is no longer appropriate for the acute inpatient setting and is able to continue treatment for mental health needs in the community with the supports as indicated below.  Patient is educated and verbalized understanding of discharge plan of care including medications, follow-up appointments, mental health resources and further crisis services in the community.  He is instructed to call 911 or present to the nearest emergency room should he experience any decompensation in mood, disturbance of bowel or return of suicidal/homicidal ideations.  Patient verbalizes understanding of this education and agrees to this plan of care   Physical Findings: AIMS:  , ,  ,  ,    CIWA:    COWS:        Psychiatric Specialty Exam:   Presentation  General Appearance:  Appropriate for Environment   Eye Contact: Fair   Speech: Clear and Coherent   Speech Volume: Normal       Mood and Affect  Mood: Euthymic   Affect: Appropriate     Thought Process  Thought Processes: Coherent   Descriptions of Associations:Intact   Orientation:Full (Time, Place and Person)   Thought Content:Logical   Hallucinations:Hallucinations: None   Ideas of Reference:None   Suicidal Thoughts:Suicidal Thoughts:  No   Homicidal Thoughts:Homicidal Thoughts: No     Sensorium  Memory: Recent Fair   Judgment: Fair   Insight: Fair     Art Therapist  Concentration: Fair   Attention Span: Fair   Recall: [employer] of Knowledge: Fair   Language: Fair     Psychomotor Activity  Psychomotor Activity: Psychomotor Activity: Normal   Musculoskeletal: Strength & Muscle Tone: within normal limits Gait & Station: normal Assets  Assets: Manufacturing Systems Engineer; Desire for Improvement; Resilience     Sleep  Sleep: Sleep: Fair       Physical Exam: Physical Exam ROS Blood pressure 101/64, pulse (!) 106, temperature 98.2 F (36.8 C), resp. rate 16, height 5' 5 (1.651 m), weight 43.8 kg, SpO2 100%. Body mass index is 16.06 kg/m.     [Tobacco Use History]  [Tobacco Use History]     Tobacco Use  Smoking Status Former   Current packs/day: 0.00   Average packs/day: 0.5 packs/day for 48.0 years (24.0 ttl pk-yrs)   Types: Cigarettes   Start date: 02/09/1962   Quit date: 02/09/2010   Years since quitting: 14.0  Smokeless Tobacco Never   Tobacco Cessation:  N/A, patient does not currently use tobacco products     Blood Alcohol level:  Recent Labs       Lab Results  Component Value Date    [facility] <15 02/08/2024        Metabolic Disorder Labs:  Recent Labs       Lab Results  Component Value Date    HGBA1C 5.6 02/10/2024    MPG 114.02  02/10/2024      Recent Labs  No results found for: PROLACTIN   Recent Labs       Lab Results  Component Value Date    CHOL 143 02/10/2024    TRIG 74 02/10/2024    HDL 59 02/10/2024    CHOLHDL 2.4 02/10/2024    VLDL 15 02/10/2024    LDLCALC 69 02/10/2024        See Psychiatric Specialty Exam and Suicide Risk Assessment completed by Attending Physician prior to discharge.   Discharge destination:  Home   Is patient on multiple antipsychotic therapies at discharge:  No   Has Patient had three or more failed trials of  antipsychotic monotherapy by history:  No   Recommended Plan for Multiple Antipsychotic Therapies: NA   Discharge Instructions       Discharge wound care:   Complete by: As directed      Wound care  Daily      Comments: Cleanse with saline, pat dry. Apply Santyl  daily to the wound bed on the top cut line, using a Q tip applicator. Do not apply any ointment to the other cuts, leaving dry. Cover the entire area with foam dressing. The foam can remain 3 days if not saturated or soiled, ok to lift and reapply    Increase activity slowly   Complete by: As directed           Allergies as of 02/21/2024         Reactions    Escitalopram Other (See Comments)    Lansoprazole      Increased gerd            Medication List       STOP taking these medications     aspirin EC 81 MG tablet    docusate sodium 100 MG capsule Commonly known as: COLACE    NON FORMULARY    omega-3 acid ethyl esters 1 g capsule Commonly known as: LOVAZA    omeprazole 20 MG capsule Commonly known as: PRILOSEC    Probiotic-10 Chew    psyllium 58.6 % packet Commonly known as: METAMUCIL           TAKE these medications       Indication  atorvastatin  80 MG tablet Commonly known as: LIPITOR  Take 1 tablet (80 mg total) by mouth daily.   Indication: High Amount of Fats in the Blood    lurasidone  20 MG Tabs tablet Commonly known as: LATUDA  Take 1 tablet (20 mg total) by mouth daily with breakfast. Start taking on: February 22, 2024   Indication: MIXED BIPOLAR AFFECTIVE DISORDER    mirtazapine  30 MG tablet Commonly known as: REMERON  Take 0.5 tablets (15 mg total) by mouth at bedtime. What changed:  medication strength how much to take   Indication: Panic Disorder    Multivitamin Tabs Take 1 tablet by mouth daily. Start taking on: February 22, 2024      QUEtiapine  25 MG tablet Commonly known as: SEROQUEL  Take 1 tablet (25 mg total) by mouth 2 (two) times daily. What changed:  how much  to take when to take this reasons to take this   Indication: Trouble Sleeping    raloxifene  60 MG tablet Commonly known as: EVISTA  Take 1 tablet (60 mg total) by mouth daily.   Indication: Osteoporosis due to Steroid Therapy    thiamine  100 MG tablet Commonly known as: VITAMIN B1 Take 1 tablet (100 mg total) by mouth daily. Start taking on:  February 22, 2024   Indication: Deficiency of Vitamin B1    traZODone  150 MG tablet Commonly known as: DESYREL  Take 1 tablet (150 mg total) by mouth at bedtime.   Indication: Trouble Sleeping             Follow-up Information       [facility]. Go on 03/06/2024.   Why: Your appointment with Dr.[name] Omie is scheduled for 03/06/2024 at 8:30 AM . According to the practice, all therapy referrals must be internal, directly from your provider. Please contact your provider once discharged from the hospital or discuss it at your upcoming visit. Contact information: [address], [place], [place] [address]   [phone]            Daaleman, Evalene SQUIBB, MD. Go on 02/24/2024.   Specialty: Family Medicine Why: Your appointment is scheduled for 02/24/24 at 9:20 AM. At this appointment you will be able to discuss home health with your provider, Dr. Adrianna Pass information: [address] [facility] [address] [phone]              [facility] Follow up.   Specialty: Urgent Care Why: You are scheduled for a virtual assessment for the Partial Hospitalization Program on Wednesday 1/14 at 10:00 am. PHP is group therapy that meets in-person Mon-Thur from 9am-2pm and Friday from 9am-1pm and lasts for approximately 2-3 weeks. This assessment appointment will last approximately 1-1.5 hours and will be virtual via MyChart. If you need to cancel or reschedule, please call [phone] Contact information: [address] [place]   [address] [phone]                        Follow-up recommendations:  Activity:  as tolerated       Signed: [place] Gustafson, MD 02/21/2024, 11:12 PM       "

## 2024-02-27 MED ADMIN — traZODone (DESYREL) tablet 150 mg: 150 mg | ORAL | @ 04:00:00

## 2024-02-27 MED ADMIN — atorvastatin (LIPITOR) tablet 80 mg: 80 mg | ORAL | @ 22:00:00

## 2024-02-27 MED ADMIN — thiamine mononitrate (vit B1) tablet 100 mg: 100 mg | ORAL | @ 22:00:00

## 2024-02-27 MED ADMIN — mirtazapine (REMERON) tablet 30 mg: 30 mg | ORAL | @ 04:00:00

## 2024-02-27 NOTE — Consults (Signed)
 Psychiatric Emergency Service  Dept. of Emergency Medicine  Progress Note      Service Date:  February 27, 2024  Admit Date/Time: 02/26/2024  5:00 PM  LOS:   Total duration of encounter: 1 day   Location of patient:IN-PERSON--[facility]  Consulting Attending: Dorn Mares, MD  Consulting Resident/Provider: Donnice FORBES Counts, PMHNP     TIME: Patient evaluation: , Collateral: , and Care Planning, review of records, collaboration with team:    INTERVENTION: risk assessment, supportive psychotherapy, psychoeducation, motivational interviewing, crisis planning, crisis counseling, reality testing, coping skill development, care coordination, and safety planning    INTERVAL HX (since last eval)  Med compliance: 100% compliant  PRNs needed: none  Behavioral issues:none      INTERVIEW: Christy Buchanan is a 69 year old with a history of depression and anxiety presenting with worsening anxiety, depressive symptoms, and recent suicidal thoughts and self-harm. Ongoing suicidal thoughts led to this hospitalization. She recently cut herself before her last admission, did not need stitches. She denies current plan or intent in the hospital but says thoughts persist and sometimes feel out of control. She links them to financial stress and feeling like a burden to her children, with strong guilt, embarrassment, and shame. Since discharge from a 2-week [facility] stay, she has had severe anxiety and difficulty readjusting at home. She struggles with daily tasks, especially in the afternoon when she feels more foggy and forgetful. Stress about managing home, finances, and closing her long-term therapy practice worsens symptoms. She has depressive symptoms with very poor sleep, low appetite with intentional effort to eat, and recent weight loss. She feels persistent guilt, low energy, frustration, and loss of interest and pleasure. She notes loneliness and increasing difficulty coping. She has cognitive complaints of memory problems and mental fog, worse in the afternoons. 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 She lives alone and manages her ADLs. She has 3 daughters and 20 grandchildren, with one daughter living locally. She was previously married. She has lived in the current area for about 10 years, originally from [place], [place] .    We discussed ongoing plan to refer for psychiatric hospitalization. She is agreeable to plan at this time. Informed her that I would touch base with her daughter who is requesting an update.     Symptom Inventory (since last evaluation):     -suicidal ideation   -Active:  YES, denies plan  -Identifies reasons for living: YES, family/pets.  -Confidence in safety in ED?High  -Confidence in safety upon DC: Moderate     -homicidal ideation (since last evaluation):   -Active  DENIES  -Confidence in safety?None    -Delusions (since last evaluation):  -DENIES     -Hallucinations (since last evaluation):     -DENIES      -Additional sxs of concern  -suicidal thoughts, anhedonia, amotivation, anxiety, depressed mood, dec appetite, hopelessness, helplessness, insomnia, and poor self-care      COLLATERAL  [name] (Daughter [phone]): Provided w/ update on disposition, informed of continued plan to pursue hospitalization. Educated on referral process. She states that pt's other daughter Duwaine will be coming to the ED today to visit pt. Denied having any other questions or concerns for this clinical research associate.      Mental Status Exam:  Appearance:    Appears stated age, Well nourished, Well developed, and Clean/Neat   Behavior:   Calm and Cooperative   Motor:   No abnormal movements  Speech/Language:    Normal rate, volume, tone, fluency and Language intact, well formed   Mood:   Depressed   Affect:   Depressed   Thought process:   Logical, linear, clear, coherent, goal directed   Thought content:     Suicidal Ideation, passive (denies plan)   Perceptual disturbances:     Denies auditory and visual hallucinations, behavior not concerning for response to internal stimuli     Orientation:   Oriented to person, place, time, and general circumstances   Attention:   Able to fully attend without fluctuations in consciousness   Concentration:   Able to fully concentrate and attend   Memory:   Immediate, short-term, long-term, and recall grossly intact    Fund of knowledge:    Consistent with level of education and development   Insight:     Fair   Judgment:    Fair   Impulse Control:   Intact       Diagnoses:   Active Problems:    Severe episode of recurrent major depressive disorder, without psychotic features    (CMS-HCC)    Anxiety      PLAN    Assessment    Pt is making little progress towards identified clinical goals evidenced by ongoing SI and significant depressive symptoms.     At this time the pt continues to require further assessment and treatment due the presence of ongoing suicidal thoughts, anxiety, depressed mood, dec concentration/attention, dec appetite, hopelessness, and insomnia.     Admission Status    We will continue to pursue admission to in-pt unit until stable for discharge to lower level of care.     Legal Status: Currently on IVC    Follow-up Plan  Reevaluation by PES providers if pt remains in ED    Observation    Q 15 Minute Obs    Medication Plan:    ++Changes++  No changes, continue current regimen    ++Standing Meds++  mirtazapine , 30 mg, Nightly  traZODone , 150 mg, Nightly           ++PRN Meds++  QUEtiapine , 12.5 mg, BID PRN

## 2024-02-27 NOTE — ED Progress Note (Signed)
 Provided daughter Duwaine with update on psychiatric dispo. She provided this clinical research associate with updated med list. This clinical research associate restarted lurasidone . Duwaine expressed concern about platelet level and overall treatment over her thrombocytopenia. Questioned whether interferon should be restarted. This clinical research associate passed on concern to EM attending Dr.Choi.

## 2024-02-28 MED ADMIN — traZODone (DESYREL) tablet 150 mg: 150 mg | ORAL | @ 02:00:00

## 2024-02-28 MED ADMIN — atorvastatin (LIPITOR) tablet 80 mg: 80 mg | ORAL | @ 14:00:00

## 2024-02-28 MED ADMIN — thiamine mononitrate (vit B1) tablet 100 mg: 100 mg | ORAL | @ 14:00:00

## 2024-02-28 MED ADMIN — lurasidone (LATUDA) tablet 20 mg: 20 mg | ORAL | @ 13:00:00 | NDC 82804003430

## 2024-02-28 MED ADMIN — pantoprazole (Protonix) EC tablet 20 mg: 20 mg | ORAL | @ 12:00:00

## 2024-02-28 MED ADMIN — mirtazapine (REMERON) tablet 30 mg: 30 mg | ORAL | @ 02:00:00

## 2024-02-28 NOTE — ED Notes (Signed)
 Pt up to the bathroom several times during the night. Med's taken without issue. No distress or discomfort noted/voiced. Ongoing monitoring continues with Q 15 min safety checks and continuous video monitoring. Safety measures in place for elopement.

## 2024-02-28 NOTE — ED Progress Note (Incomplete)
 ED Progress Note    February 28, 2024    Assumed care of patient at 0900    Christy Buchanan is a 69 y.o. female with PMH most significant for sjogren's disease, breast cancer, hyperlipidemia, thrombocythemia, autoimmune thyroiditis, GERD, normal pressure hydrocephalus s/p ventriculoperitoneal shunt, osteoporosis, bipolar 2 disorder, and mild cognitive impairment (due to memory loss) who presents with suicidal ideations related to worsening confusion and medical decline.  She was recently discharged from outside hospital on 02/21/2024 in setting of prior suicide attempt.  She has a VP shunt and known chronic bilateral subdural hematomas.  There was concern that the bilateral subdural hematomas had worsened however radiology was able to compare imagine to prior and did not appear to have worsened on direct comparison. She was medically cleared and evaluated by psychiatry with recommendation for psychiatric admission.  She is currently pending bed placement.

## 2024-02-28 NOTE — Progress Notes (Signed)
 Psychiatric Emergency Service  Dept. of Emergency Medicine  Progress Note      Service Date:  February 28, 2024  Admit Date/Time: 02/26/2024  5:00 PM  LOS:   Total duration of encounter: 2 days   Location of patient:IN-PERSON--[place] [facility]  Consulting Attending: Diann Monarch, MD  Consulting Resident/Provider: Velia JULIANNA Mercy, PMHNP     TIME: Patient evaluation: 15 min    INTERVENTION: risk assessment, supportive psychotherapy, psychoeducation, crisis counseling, and reality testing    INTERVAL HX (since last eval)  Med compliance: 100% compliant  PRNs needed: none  Behavioral issues:none      INTERVIEW: Pt approached as she was finishing breakfast in her room. She has consumed about 75 %. She has depressed mood, flat affect. When asked if she feels suicidal, she only says I could not do anything here. She reports feeling very depressed and guilty for taxing her children with her mental health needs. She reports worsening depression, anxiety, confusion for several months, despite medication compliance. Says that she was unable to follow up with the IOP that [facility] recommended at discharge because Medicare did not cover it. She does not feel safe to discharge and is hopeful that she can get an inpatient bed at [facility], as she sees [facility] psychiatry outpatient.           Symptom Inventory (since last evaluation):     -suicidal ideation   -Active:  DENIES  -Identifies reasons for living: YES, My kids and grand kids       -homicidal ideation (since last evaluation):   -Active  DENIES    -Delusions (since last evaluation):  -DENIES     -Hallucinations (since last evaluation):     -DENIES      -Additional sxs of concern  -anhedonia, amotivation, anxiety, depressed mood, poor self-care, and weight loss        COLLATERAL  Not obtained at this time.      Mental Status Exam:  Appearance:    Appears older than stated age   Behavior:   Calm, Cooperative, and Direct eye contact   Motor:   No abnormal movements Speech/Language:    Normal rate, volume, tone, fluency   Mood:   Depressed   Affect:   Depressed, Flat, and Mood congruent   Thought process:   Logical, linear, clear, coherent, goal directed   Thought content:     Suicidal Ideation, passive   Perceptual disturbances:     Denies auditory and visual hallucinations, behavior not concerning for response to internal stimuli     Orientation:   Oriented to person, place, time, and general circumstances   Attention:   Able to fully attend without fluctuations in consciousness   Concentration:   Able to fully concentrate and attend   Memory:   Immediate, short-term, long-term, and recall grossly intact    Fund of knowledge:    Consistent with level of education and development, though not formally assessed   Insight:     Fair   Judgment:    Fair   Impulse Control:   Intact       Diagnoses:   Active Problems:    Severe episode of recurrent major depressive disorder, without psychotic features    (CMS-HCC)    Anxiety      PLAN    Assessment    Pt is making no progress towards identified clinical goals evidenced by persistent depressed mood.     At this time the pt continues to  require further assessment and treatment due the presence of ongoing anhedonia, amotivation, anxiety, and depressed mood.     Admission Status    We will continue to pursue admission to in-pt unit until stable for discharge to lower level of care.     Legal Status: Currently on IVC    Follow-up Plan  Reevaluation by PES providers if pt remains in ED    Observation    1:1 Observation in non-psych area, q15 when in locked psych area    Medication Plan:    ++Changes++  No changes, continue current regimen    ++Standing Meds++  atorvastatin , 80 mg, Daily  lurasidone , 20 mg, Daily  mirtazapine , 30 mg, Nightly  pantoprazole , 20 mg, Daily before breakfast  thiamine  mononitrate (vit B1), 100 mg, Daily  traZODone , 150 mg, Nightly           ++PRN Meds++  QUEtiapine , 12.5 mg, BID PRN

## 2024-02-29 MED ADMIN — traZODone (DESYREL) tablet 150 mg: 150 mg | ORAL | @ 01:00:00

## 2024-02-29 MED ADMIN — atorvastatin (LIPITOR) tablet 80 mg: 80 mg | ORAL | @ 13:00:00

## 2024-02-29 MED ADMIN — thiamine mononitrate (vit B1) tablet 100 mg: 100 mg | ORAL | @ 13:00:00

## 2024-02-29 MED ADMIN — lurasidone (LATUDA) tablet 20 mg: 20 mg | ORAL | @ 13:00:00

## 2024-02-29 MED ADMIN — pantoprazole (Protonix) EC tablet 20 mg: 20 mg | ORAL | @ 13:00:00

## 2024-02-29 MED ADMIN — mirtazapine (REMERON) tablet 30 mg: 30 mg | ORAL | @ 01:00:00

## 2024-02-29 NOTE — Telephone Encounter (Signed)
 Please schedule this patient for an appointment as noted below.  Please contact the patient as needed/appropriate to alert them of this appointment.    Provider: Manuelita Ley, NP    Date or Time Frame: 2-4 weeks    Reason for Visit: NPH with VPS follow up     Imaging Needed: No

## 2024-02-29 NOTE — Consults (Signed)
 NEUROSURGERY CONSULT NOTE      Requesting Attending Physician:  Larraine Marcos Louder, MD  Service Requesting Consult:  Psychiatry (PSY)    Assessment and Recommendations  Christy Buchanan is a 69 y.o. 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  Neurological exam is reassuring and the patient is oriented x 3 and does not have any apparent neurological deficits.  CT head (1/17) shows expected evolution of non-compressive chronic bilateral subdural hematomas when compared to 12/31 with expected smaller ventricles given the recent adjustment (Sophysa 110 > 70) on 01/31/2024.  Speaking with her daughter it appears that her cognitive impairment that she presented with back in December has not improved since turning down her shunt and she has not had her typical symptoms of NPH such as urinary frequency and gait imbalance.  Given the expanding chronic subdurals and the no apparent benefit from the increased flow we we will increase her Sophysa from 70 > 110 and follow up with her in 2 weeks in the outpatient setting.      Patient was discussed with Royce Quin, MD and staffed with Rosaria Lush, MD.   For questions/concerns regarding patients:  Mon-Fri 6 AM-6 PM, please page Cranial Neurosurgery Consult Service at [phone]  Sun-Thurs 6 PM-6 AM AND weekends/holidays, please page Neurosurgery Night Float/ On-Call Service       Problem List  Active Problems:    Severe episode of recurrent major depressive disorder, without psychotic features    (CMS-HCC)    Anxiety      History of Present Illness  Christy Buchanan is a 69 y.o. female seen in consultation at the request of Larraine Marcos Louder, MD for evaluation of shunted NPH whom we have seen in the outpatient setting who presented with 2 months of cognitive decline, mental fogginess, impaired concentration but without her typical symptoms of NPH (urinary frequency and gait imbalance). She was being seen by Manuelita Sanes on 12/22 where her shunt was turned down from 110 to 70 in order to increase flow.  Per her daughter she has only had cognitive impairment and cognitive fog and mental slowing but no gait imbalance or urinary issues such as the urinary frequency that she had when she originally presented with NPH.  Meanwhile after reducing the shunt setting and increasing flow her symptoms have not improved.    Review of Systems  A 10-system review of systems was conducted and was negative except as documented above in the HPI.  ______________________________________________________________________    Physical Exam  General: No acute distress; There is no height or weight on file to calculate BMI.    Neurological Exam:  Eyes open spontaneously  Oriented to name, location, time  Pupils equal and reactive bilaterally  Extraocular movements intact bilaterally  Face symmetric  Tongue protrudes in the midline  Motor strength antigravity x 4  No pronator drift    Neurological imaging reviewed  CT Imaging from this date 1/17 was personally reviewed and demonstrates decreased caliber of ventricles and expansion of bilateral subdural hematomas when compared to the CT done in 12/31.     The patient's available vitals, intake/output, medications, labs, and relevant neurological imaging were independently reviewed.    ---Historical data---    Allergies  Escitalopram     Medications  Reviewed in Epic.  Medications relevant to this consult listed below.    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    Placed 11.2023  Sophysea  150 is setting 11.2023    Vitamin D deficiency    [2]   Past Surgical History:  Procedure Laterality Date    AUGMENTATION MAMMAPLASTY Bilateral     24ish yrs    BREAST BIOPSY Left     malignant    CHEMOTHERAPY      MASTECTOMY Left 25 ish yrs ago    PR CREATE SHUNT:VENTRIC-PERITONEAL Right 12/30/2021    Procedure: CREAT SHUNT; VENTRICULO-PERITONEAL/PLEURAL;  Surgeon: Kari Donnice Gaster, MD;  Location: MAIN OR [facility];  Service: Neurosurgery    PR LAP, SURG ENTEROLYSIS Right 12/30/2021    Procedure: LAPAROSCOPY, SURGICAL, ENTEROLYSIS (FREEING OF INTESTINAL ADHESION) (SEPARATE PROCEDURE);  Surgeon: Carlin Curtistine Route, MD;  Location: MAIN OR [facility];  Service: Trauma    RADIATION Left    [3]   Family History  Problem Relation Age of Onset    Cancer Mother     Heart disease Mother     Colon cancer Father     Cancer Father     No Known Problems Sister     No Known Problems Brother     BRCA 1/2 Daughter     BRCA 1/2 Daughter     BRCA 1/2 Daughter     No Known Problems Son     No Known Problems Maternal Grandmother     No Known Problems Maternal Grandfather     No Known Problems Paternal Grandmother     No Known Problems Paternal Grandfather     No Known Problems Other     Breast cancer Neg Hx     Endometrial cancer Neg Hx     Ovarian cancer Neg Hx    [4]   Social History  Tobacco Use    Smoking status: Former     Types: Cigarettes     Passive exposure: Never    Smokeless tobacco: Never   Vaping Use    Vaping status: Never Used   Substance Use Topics    Alcohol use: Not Currently     Comment: rarely    Drug use: Not Currently

## 2024-02-29 NOTE — Progress Notes (Signed)
 PSYCHIATRIC EMERGENCY SERVICE                     PROGRESS NOTE      Duwaine Brandy, daughter [phone])  Informed daughter of plan to admit to [facility] psych, provided unit contact number.     Josette Ly, LCSW  Psychiatric Emergency Services

## 2024-02-29 NOTE — Consults (Signed)
 Internal Medicine Consult Service    Assessment and Recommendations:   Christy Buchanan is a 69 y.o. 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 Pt was seen at the request of Larraine Marcos Louder, MD (Psychiatry (PSY)) for unexplained weight loss and skin lesion.     Psychiatry (PSY) request that the medicine consult team place orders based on their recommendations: No    # Unexplained weight loss  # Depression, anxiety   # History of left breast cancer s/p chemo and lumpectomy in 1995, currently in remission   Reportedly lost 20 lbs in the last year. Current BMI 16.69%. Weight loss could potentially be explained by cognitive impairment and worsening depression/anxiety. Nevertheless, think it is reasonable for patient to have age appropriate cancer screenings. Mammogram, colonoscopy have been performed in the last year and are without evidence of cancer. Patient does have a 40+ pack year smoking history.  - Would recommend age appropriate cancer screening with non-urgent CT chest w/o contrast during admission or low-dose CT non-con outpatient  - Agree with nutrition consult   - Agree with current psychiatric admission-feel addressing her mental health will be the most helpful to encouraging PO intake    # Cough, subacute  Dry cough for 3-4 weeks per patient. Lungs clear on exam. Chest xray without acute abnormality. Think this is likely post-viral. Acid reflux could also be a contributor.   - Recommend symptomatic management as needed  - Home PPI  - Advised patient that this may take time to resolve     # Healing skin tear, likely secondary to excoriation  Exam consistent with excoriated skin on the back and healing skin tear. No concern for   - Recommend Vaseline to the site PRN to aid with healing  - Could consider topical steroid to back if patient complains of pruritus    # Essential thrombocytopenia  - Agree with Hem/Onc consult    # NPH s/p shunt with chronic SDH  # Cognitive impairment  - Agree with Neurosurgery consult     # History of autoimmune thyroiditis  TSH WNL on admission.   - Continue to monitor     # GERD  - Continue home PPI      Medicine consult service will SIGN OFF at this time. Thank you for the consult.     Reason for Consultation:   Pt was seen at the request of Larraine Marcos Louder, MD (Psychiatry (PSY)) in consultation for unexplained weight loss and skin lesion.    Subjective:   HPI:  Christy Buchanan is a 69 y.o. 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 Patient follows with Hem/Onc for her essential thrombocytopenia and gets regular mammograms (last 10/2023). Had an upper and lower endoscopy in October 2025, showing mild gastritis, otherwise unremarkable.     Patient reports that she is feeling very anxious today. Also endorses dry cough over the past 3-4 weeks. She does not feel that anything she has tried has helped. Endorses history of acid reflux. Feels it may be worse recently.     She reports that she did not know she had a lesion on her back until the nurses pointed it  out today. She is unsure if she has been itching her back.     Denies chest pain, shortness of breath.      Allergies:  Escitalopram     Medications:   Prior to Admission medications   Medication Dose, Route, Frequency   acetaminophen  (TYLENOL ) 325 MG tablet 650 mg, Oral, Every 4 hours PRN   atorvastatin  (LIPITOR ) 80 MG tablet 80 mg, Oral, Daily (standard)   cyanocobalamin, vitamin B-12, (VITAMIN B-12 ORAL) Oral, Daily (standard)   docusate sodium  (STOOL SOFTENER ORAL) 1 tablet, Oral, Nightly   ferrous sulfate  325 (65 FE) MG EC tablet 325 mg, Oral, 3 times weekly   lurasidone  (LATUDA ) 20 mg Tab 20 mg   mirtazapine  (REMERON ) 30 MG tablet    omega-3 fatty acids -fish oil  300-1,000 mg cap capsule 1 capsule, Daily  Patient not taking: Reported on 01/31/2024   omeprazole  (PRILOSEC) 20 MG capsule 20 mg, Oral, Daily (standard)   QUEtiapine  (SEROQUEL ) 25 MG tablet 25 mg, Oral, 2 times a day (standard)   raloxifene  (EVISTA ) 60 mg tablet 60 mg, Oral, Daily (standard)   simethicone (MYLICON) 80 MG chewable tablet 80 mg, Every 6 hours PRN   traZODone  (DESYREL ) 150 MG tablet 150 mg, Oral, Nightly   vitamins B1 B6 B12 Liqd Every other day       Medical History:  Past Medical History[1]    Surgical History:  Past Surgical History[2]    Social History:  Short Social History[3]     Family History:  Family History[4]    Review of Systems:  10 systems were reviewed and are negative unless otherwise mentioned in the HPI    Objective:   Physical Exam:  Temp:  [36.8 ??C (98.2 ??F)-36.9 ??C (98.4 ??F)] 36.9 ??C (98.4 ??F)  Pulse:  [93-95] 93  Resp:  [18] 18  BP: (113-132)/(62-73) 113/62  SpO2:  [97 %-100 %] 100 %    Gen: Thin appearing, in NAD, anxious, answers questions appropriately  Eyes: Sclera anicteric, EOMI, PERRLA  HENT: atraumatic, normocephalic, MMM. OP w/o erythema or exudate   Neck: no cervical lymphadenopathy or thyromegaly, no JVD  Heart: RRR, S1, S2, no M/R/G, no chest wall tenderness  Lungs: CTAB, no crackles or wheezes, no use of accessory muscles, occasional dry cough noted   Abdomen: Normoactive bowel sounds, soft, NTND, no rebound/guarding, no hepatosplenomegaly  Extremities: no clubbing, cyanosis, or edema: pulses are +2 in bilateral upper and lower extremities  Neuro: CN II-XI grossly intact, normal cerebellar function, normal gait. No focal deficits.  Skin: Skin tear with overlying thin scab noted on the left upper back/shoulder. Excoriated skin noted on the right upper back. No other notable lesions on exam of the back and shoulders.   Psych: Alert and grossly oriented, anxious.    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    Placed 11.2023  Sophysea  150 is setting 11.2023    Vitamin D deficiency    [2]   Past Surgical History:  Procedure Laterality Date    AUGMENTATION MAMMAPLASTY Bilateral     24ish yrs    BREAST BIOPSY Left     malignant    CHEMOTHERAPY      MASTECTOMY Left 25 ish yrs ago    PR CREATE SHUNT:VENTRIC-PERITONEAL Right 12/30/2021    Procedure: CREAT SHUNT; VENTRICULO-PERITONEAL/PLEURAL;  Surgeon: Kari Donnice Gaster, MD;  Location: MAIN OR ;  Service: Neurosurgery    PR LAP, SURG ENTEROLYSIS Right 12/30/2021    Procedure: LAPAROSCOPY, SURGICAL, ENTEROLYSIS (FREEING OF INTESTINAL ADHESION) (SEPARATE PROCEDURE);  Surgeon: Carlin Curtistine Route, MD;  Location: MAIN OR [facility];  Service: Trauma    RADIATION Left    [3]   Social History  Tobacco Use    Smoking status: Former     Types: Cigarettes     Passive exposure: Never    Smokeless tobacco: Never   Vaping Use    Vaping status: Never Used   Substance Use Topics    Alcohol use: Not Currently     Comment: rarely    Drug use: Not Currently   [4]   Family History  Problem Relation Age of Onset    Cancer Mother     Heart disease Mother     Colon cancer Father     Cancer Father     No Known Problems Sister     No Known Problems Brother     BRCA 1/2 Daughter     BRCA 1/2 Daughter     BRCA 1/2 Daughter     No Known Problems Son     No Known Problems Maternal Grandmother     No Known Problems Maternal Grandfather     No Known Problems Paternal Grandmother     No Known Problems Paternal Grandfather     No Known Problems Other     Breast cancer Neg Hx     Endometrial cancer Neg Hx     Ovarian cancer Neg Hx
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
## 2024-02-29 NOTE — Hospital Course (Addendum)
 Christy Buchanan is a 69 y.o. 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 She was transferred to 4 Oncology Unit for further medical management on 2/1 after a rapid response was called on the [facility] Unit for shortness of breath and acute vital sign changes.    -The following has been communicated to admitting team:       - Patient is admitted involuntarily (on IVC) and per hospital policy, patients on IVC require a sitter in medical/surgical units. Please order a sitter for 1:1 observation. Call hospital police if patient attempts to leave.        - This patient will require further inpatient psychiatric hospitalization upon medical stabilization. Please page the psychiatry consults team at [phone] to follow along during hospitalization.    Additional relevant history includes:    -- Patient has history of cognitive changes, impaired gait, and urinary incontinence in 2023, with NPH found on imaging. Shunt was placed and patient improved. In light of worsening cognition in the months preceding admission, outpatient neurosurgeon adjusted shunt settings, but per patient and daughters there was no notable improvement. She was meant to get additional shunt imaging, but declined due to worsening anxiety. Follows with [facility].  -- History of Essential Thrombocythemia, follows with hematology outpatient. Previously on Interferon, which was stopped due to concerns for worsening anxiety; patient did not see improvement after stopping this medication. Previously on daily aspirin for anticoagulation, but recently stopped given findings of chronic bilateral hematomas.   -- Patient hospitalized at [facility] from 02/09/24 - 02/21/24 after a self-inflicted cut to the wrist in a suicide attempt. Latuda  was initiated due to prior positive response. This was patient's only prior psychiatric hospitalization.  -- Works as a paramedic and still actively seeing clients  -- Patient follows with Dr. Duwaine Nation in [facility] clinic.  {TIP. Include relevant suicide, housing, employment, trauma, sexually inappropriate, violence, incarceration history here.:[id number]:: }    {TIP. All wildcards labelled TIP automatically delete on signing. Okay to delete manually as well.:[id number]:: }    Hospitalization Narrative:     Patient presented to the ED on 1/17 after calling her daughters and telling them that she did not feel that she could keep herself safe. Per daughters, patient has had some mild memory difficulties consistent with normative aging for the past 2-3 years. However, starting in October, patient had notably worsening anxiety with decreased general functioning at home and work. Her cognition significantly worsened in December and patient appeared frequently confused, had difficulties with IDLs/ADLs, and she had frequent panic attacks. Patient had lost 20 lbs over the last year, with BMI 16.7 at time of admission. Given concerns for suicidal ideation with recent attempt, and inability to care for self at home, patient was admitted to Geriatric Psychiatry.     The patient's presentation, including symptoms of low mood, difficulty sleeping, anhedonia, weight loss, difficulty concentrating, anxiety, and recent suicide attempt (via cutting wrists), appears to be most consistent with a diagnosis of major depressive disorder. Could consider bipolar disorder given reported history; however, lack of any clear hypomanic or manic episodes in her history make this less likely. Should also consider the contribution of trauma symptoms  given diagnosis of PTSD, history of trauma, and reports of nightmares and hypervigilance. Must also consider contribution of psychosocial stressors, as per daughter patient's anxiety significantly worsened after receiving court summons for financial stressors in October. Finally, severe medical illnesses (including cancer - see below) can contribute to worsening mental state, nutrition status, and cognitive decline.    Patient and daughters also reported sub-acute cognitive decline (confusion, inability to complete IDLs, getting lost in familiar situations) over the last year that abruptly and significantly worsened in December. She was receiving work-up for potential shunt dysfunction with her outpatient neurosurgeon, who she sees for a history of NPH, but did not complete imaging due to anxiety. Her shunt was adjusted prior to admission. Should also consider potential underlying neurocognitive disorder given MRI from 2023 with chronic small vessel ischemic changes.     Patient's home Latuda , mirtazapine , and trazodone  were continued on admission. Patient had recently been restarted on Latuda  at a hospitalization in December, as she felt that this had worked very well for her previously. While patient had not perceived benefit from it yet this time, notably Latuda  is best absorbed with meals and patient reported minimal to no PO intake prior to admission. Therefore, this was continued with the hope that it would increase in efficacy as her nutrition status improved, and was titrated to 40 mg by time of discharge to medical unit 2/1.    Medically, patient had several chronic conditions that were managed this admission. There was concern that some of her cognitive difficulties could be related to VP shunt dysfunction and neurosurgery was consulted. They found that her chronic bilateral hematomas had mildly expanded with concern for overdrainage. They adjusted the shunt settings from 70 to 110 on 1/21. Hematology was also consulted as her platelets on admission (650) were significantly above her baseline (400-500). There was a discussion regarding the utility of anticoagulation, but given that she is ambulatory and in light of the bilateral hematomas expanding, the decision was made to hold anticoagulation during this admission. Wound care was consulted for her self-inflicted wrist wound, and reported that it was healing well. Internal medicine was consulted for a lesion on patients shoulder that she was not aware of, and felt it was consistent with a skin tear. Patient presented with poor PO intake and BMI 16.7 with dramatic recent weight loss; refeeding protocol was started on admission with frequent lab monitoring and dietician was consulted. BMI increased to 17.3 at time of medical transfer.    In addition to her dramatic weight loss, patient also reported a chronic dry non-productive cough that she had for several weeks. A CXR obtained in the ED was unremarkable, but given her 40+ smoking history and after discussing with IM, a Chest CT was ordered on 1/21. This image revealed a large 4.6 cm mass centered in the peribronchovascular superior segment of the right lower lobe, along with multiple areas of lymphadenopathy and nodular thickening of the pericardium, which was concerning for potential primary lung cancer with metastatic lymphadenopathy and potential pericardial metastatic disease. 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 Palliative care was consulted, and GOC conversations are ongoing pending input from oncology for treatment options. Recommended initiation of low dose oxycodone  for pain control. Oncology had discussion with patient and daughter, [name] (HCPOA), about potential treatment options; treatment decision was not solidified at the time of discharge to 4 Oncology.     On 2/1, a rapid response  was called for shortness of breath and O2 desaturation to 85% on RA. Patient endorsed shortness of breath at rest which had been worsening throughout the day. Vital signs demonstrated recovery of O2 sat to 92% on RA and worsening tachycardia to 120. Patient was placed on 4L [place] with improvement of O2 sat to 96%. Duo nebs were initiated. Labs were remarkable for mildly elevated lactate to 2.0, largely unremarkable VBG (Bicarb mildly elevated to 29), leukocytosis to 18.1 (increased from 13.2 four days prior), Hb 10.3 (decreased from 11.8 earlier in admission), and mild transaminitis. CXR pending at time of transfer. Patient was transferred to 4 Oncology for further workup and treatment of shortness of breath.      {TIP. Discuss course of hospitalization. Add behavioral events in ED and on unit. Discuss periods of elevated level of supervision.:[id number]:: }   {TIP. What shows safe to discharge? MD/Staff/Patient/Outpatient Provider/Collateral -> who noted improvements, who was in agreement with plan for discharge? If outside supports were not involved, why not?:[id number]:: }    Psychiatric Follow Up Items:   {TIP. Add separate line items for each - continued medication titration, recommended monitoring, re/checking labs, date next LAI is due, [id number]:: }    # MDD - c/f Neurocognitive Disorder  -- Latuda  40 mg PO daily   -- Mirtazapine  30 mg PO nightly  -- Trazodone  150 mg PO nightly  -- MOCA 23/30 this admission     # Self-Inflicted Wrist Wound   -- Consult wound nurse              -- Wound care evaluated lesion 1/21, healing well    # Therapy Interventions  -- Received RT, OT, Milieu therapy during psychiatric admission  {TIP. 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   -- Consult oncology, recs appreciated   -- Palliative care consulted, recs appreciated          - Oxycodone  2.5 mg PO every 4 hours PRN for chest pain  -- CT Chest concerning for mets to lymph nodes and pericardium  -- MRI Brain and CT Abd/Pelvic without signs of additional mets    #COPD  -- Continue Duoneb q6h per pulmonology   -- Continue albuterol  q4-6h PRN     # c/f Pancreatic cysts   -- CT Abd with numerous lesions in pancrease concerning for pancreatic cysts              -- Will need outpatient MRI abd w wo after discharge for further work-up     #Endometrial thickening   -- CT Pelvis showed prominent endometrium measuring up to 1.3 cm              -- Will need outpatient endovaginal ultrasound for further work-up     #Nutritional Rehabilitation   -- Continue 100mg  Vitamin B1  -- BMP, Mg, Phos every 24 hours to monitor for refeeding syndrome  -- Track meal percentages and InOs  -- Weekly weight  -- Nutrition  follollwing  -- Supplementation with Ensure Plus 3x daily with meals, 1 Magic cup daily at lunch     # HLD  -- continue home 80mg  atorvastatin      #GERD  -- continue home 20mg  pantoprazole       #Constipation  -- continue senna 1 tab nightly  -- continue miralax  17g daily prn     #NPH, s/p VP shunt (2023)  --Consult neurosurgery, recs appreciated              -- NSGY adjusted shunt to 110 on 1/21     #Essential Thrombocythemia    --Consult hematology, recs appreciated              -- Defer ET medications at this time given presence of subdural hematomas              -- Discussed with hematology in light of recent lung findings, will continue to defer for the moment    Medical Follow up items:   -- Ongoing Oncology, Pulmonology, Palliative care to solidify GOC, treatment plan for newly diagnosed lung cancer  -- Outpatient MRI abd w/wo for further work-up of pancreatic lesions found of CT Abd  -- Outpatient endovaginal ultrasound for further workup of endometrial thickening found on CT pelvis    Metabolic Monitoring:  Initial Weight: Weight: 45.5 kg (100 lb 4.8 oz)  Last Weight: Weight: 45.5 kg (100 lb 4.8 oz)  Last BMI: Body mass index is 16.69 kg/m??.  Admit BP: BP: 115/92  Last BP: BP: 141/66  Lipid Panel:   Lab Results   Component Value Date    Cholesterol, Total 152 12/08/2022    LDL Direct 53.0 08/24/2023    Cholesterol, LDL, Calculated 111 (H) 03/11/2022    Cholesterol, HDL 58 03/11/2022    Triglycerides 104 03/11/2022     Hemoglobin A1C:   Lab Results   Component Value Date    Hemoglobin A1C 5.3 05/29/2022      Fasting Blood Sugar:   Lab Results   Component Value Date    Glucose 107 (H) 12/16/2023         Hospital Services Provided: Psychiatric physician and nursing services, case management, recreational therapy, occupational therapy, physical therapy, nutrition counseling , medical consultation , and psychological services. Multidisciplinary treatment plan(s) were established within 72 hours of admission; discussed daily and updated weekly. The patient had access to individual, group, and milieu therapeutic modalities.     Risk Assessment: The patient was discharged from the Geropsychiatry inpatient unit and readmitted to the 4 Oncology inpatient unit for ongoing medical care. The patient is not yet psychiatrically stable at the time of transfer to medical unit and will continue to be under IVC; 1:1 observation will be provided while on the medical unit. Consultation-liaison inpatient psychiatry will be following the patient for further psychiatric care during her medical admission, with likely transfer back to inpatient psychiatry for further psychiatric treatment and stabilization once medically stable.

## 2024-02-29 NOTE — Consults (Signed)
 Social Work  Psychosocial Assessment    Patient Name: Christy Buchanan   Medical Record Number: [id number]   Date of Birth: March 26, 1955  Sex: Female     Referral  Referred by: Comments  Comment: PER PSYCH CONSULT NOTE, 69 year old with a history of depression and anxiety presenting with worsening anxiety, depressive symptoms, and recent suicidal thoughts and self-harm. Ongoing suicidal thoughts led to this hospitalization. She recently cut herself before her last admission, did not need stitches.  Reason for Referral: Cognitive / Behavioral / Psych Concerns    Extended Emergency Contact Information  Primary Emergency Contact: Vaughan,[name]  Mobile Phone: [phone]  Relation: Daughter  Interpreter needed? No  Secondary Emergency Contact: Waycott,Sarah  Mobile Phone: [phone]  Relation: Daughter    Legal Next of Kin / Guardian / POA / Advance Directives    HCDM (patient stated preference): Artis Bouchard - Daughter - [phone]                   Discharge Planning  Discharge Planning Information:   Type of Residence   Mailing Address:  [address]  [place] [address]    Medical Information   Past Medical History[1]    Past Surgical History[2]    Family History[3]    Financial Information   Primary Insurance: Payor: [employer] MEDICARE ADV / Plan: [employer] MEDICARE ADV / Product Type: *No Product type* /    Secondary Insurance: None   Prescription Coverage: Medicare D     Preferred Pharmacy: [facility] [id number] [facility] - [place],  - [address] AT [address]  [facility] - [place], [place] - [address]    Barriers to taking medication: TBD    Transition Home   Transportation at time of discharge: Other: TBD    Anticipated changes related to Illness: TBD   Services in place prior to admission: Community Mental Health Services: Bouchard [name] Nation, DO   [place] [facility]   Services anticipated for DC: Community Mental Health Services: Bouchard [name] Nation, DO   [place] [facility]   Hemodialysis Prior to Admission: No    Readmission  Risk of Unplanned Readmission Score: UNPLANNED READMISSION SCORE: 10.22%  Readmitted Within the Last 30 Days?   Readmission Factors include: other: NA    Social Drivers of Health  Social Drivers of Health     Food Insecurity: No Food Insecurity (02/09/2024)    Received from [facility]    Hunger Vital Sign     Within the past 12 months, you worried that your food would run out before you got the money to buy more.: Never true     Within the past 12 months, the food you bought just didn't last and you didn't have money to get more.: Never true   Tobacco Use: Medium Risk (02/24/2024)    Patient History     Smoking Tobacco Use: Former     Smokeless Tobacco Use: Never     Passive Exposure: Never   Transportation Needs: No Transportation Needs (02/09/2024)    Received from [facility] - Transportation     In the past 12 months, has lack of transportation kept you from medical appointments or from getting medications?: No     In the past 12 months, has lack of transportation kept you from meetings, work, or from getting things needed  for daily living?: No   Alcohol Use: Not on file   Housing: Low Risk (10/15/2023)    Housing     Within the past 12 months, have you ever stayed: outside, in a car, in a tent, in an overnight shelter, or temporarily in someone else's home (i.e. couch-surfing)?: No     Are you worried about losing your housing?: No   Physical Activity: Not on file   Utilities: Not At Risk (02/09/2024)    Received from [facility]     In the past 12 months has the electric, gas, oil, or water company threatened to shut off services in your home?: No   Stress: Not on file   Interpersonal Safety: Patient Unable To Answer (08/24/2023)    Interpersonal Safety     Unsafe Where You Currently Live: Patient unable to answer     Physically Hurt by Anyone: Patient unable to answer     Abused by Anyone: Patient unable to answer   Substance Use: Not on file (12/14/2022)   Intimate Partner Violence: Not At Risk (02/09/2024)    Received from [facility]    Humiliation, Afraid, Rape, and Kick questionnaire     Within the last year, have you been afraid of your partner or ex-partner?: No     Within the last year, have you been humiliated or emotionally abused in other ways by your partner or ex-partner?: No     Within the last year, have you been kicked, hit, slapped, or otherwise physically hurt by your partner or ex-partner?: No     Within the last year, have you been raped or forced to have any kind of sexual activity by your partner or ex-partner?: No   Social Connections: Socially Isolated (02/09/2024)    Received from [facility]    Social Connection and Isolation Panel     Frequency of Communication with Friends and Family: Three times a week     Frequency of Social Gatherings with Friends and Family: Three times a week     How often do you attend church or religious services?: Never     Do you belong to any clubs or organizations such as church groups, unions, fraternal or athletic groups, or school groups?: No     How often do you attend meetings of the clubs or organizations you belong to?: Never     Marital Status: Divorced   Physicist, Medical Strain: Patient Declined (10/15/2023)    Overall Financial Resource Strain (CARDIA)     Difficulty of Paying Living Expenses: Patient declined   Recent Concern: Physicist, Medical Strain - Medium Risk (09/23/2023)    Received from [facility]    Overall Financial Resource Strain (CARDIA)     Difficulty of Paying Living Expenses: Somewhat hard   Health Literacy: Not on file   Internet Connectivity: Internet connectivity concern identified (10/15/2023)    Internet Connectivity     Do you have access to internet services: No     How do you connect to the internet: Not on file     Is your internet connection strong enough for you to watch video on your device without major problems?: Not on file     Do you have enough data to get through the month?: Not on file     Does at least one of the devices have a camera that you can use for video chat?: Not on file  Social History     Medical and Psychiatric History  Psychosocial Stressors: Coping with health challenges/recent hospitalization, Family issues / concerns, Trauma (recent or history of), Grief/Loss (e.g. relationship, social, work, surveyor, quantity), Isolation (e.g. feeling cut off, patient self isolates)      Psychological Issues/Information: Mental illness, Psychiatric evaluation indicated   Mental Illness Concerns: Active mental illness concerns, Patient history of mental illness, Suicidal ideation / attempt, Outpatient treatment for mental illness          Chemical Dependency: Alcohol        Outpatient Providers: Primary Care Provider, Psychiatrist   Name / Contact #: : Duwaine [name] Nation, DO   [facility], [facility], [place]OTHY [facility]UL  Legal: No legal issues      Ability to [employer]: No issues accessing community services           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    Placed 11.2023  [place]  150 is setting 11.2023    Vitamin D deficiency    [2]   Past Surgical History:  Procedure Laterality Date    AUGMENTATION MAMMAPLASTY Bilateral     24ish yrs    BREAST BIOPSY Left     malignant    CHEMOTHERAPY      MASTECTOMY Left 25 ish yrs ago    PR CREATE SHUNT:VENTRIC-PERITONEAL Right 12/30/2021    Procedure: CREAT SHUNT; VENTRICULO-PERITONEAL/PLEURAL;  Surgeon: Kari Donnice Gaster, MD;  Location: MAIN OR [facility];  Service: Neurosurgery    PR LAP, SURG ENTEROLYSIS Right 12/30/2021    Procedure: LAPAROSCOPY, SURGICAL, ENTEROLYSIS (FREEING OF INTESTINAL ADHESION) (SEPARATE PROCEDURE);  Surgeon: Carlin Curtistine Route, MD;  Location: MAIN OR [facility];  Service: Trauma    RADIATION Left    [3]   Family History  Problem Relation Age of Onset    Cancer Mother     Heart disease Mother     Colon cancer Father     Cancer Father     No Known Problems Sister     No Known Problems Brother     BRCA 1/2 Daughter     BRCA 1/2 Daughter     BRCA 1/2 Daughter     No Known Problems Son     No Known Problems Maternal Grandmother     No Known Problems Maternal Grandfather     No Known Problems Paternal Grandmother     No Known Problems Paternal Grandfather     No Known Problems Other     Breast cancer Neg Hx     Endometrial cancer Neg Hx     Ovarian cancer Neg Hx

## 2024-02-29 NOTE — Consults (Signed)
 Benign/Classical Hematology Consult Note      Requesting Attending Physician :  Larraine Marcos Louder, MD  Service Requesting Consult : Psychiatry (PSY)  Reason for Consult: ET, thrombocytosis  Primary Hematologist: fleeta Ebbing    Christy Buchanan is a 69 y.o. woman with MDD and cognitve impairment, NPH with shunt, ET previously on IFN (stopped 2024 2/2 mood disturbances) and ASA which was recently due to concern for intracranial bleeding given chronic SDH seen on CT on 02/09/24  who was admitted for worsening mood . Hematology was consulted for evaluation of ET.     #Essential thrombocythemia, JAK2 V617F+  For JAK2 V617F+ ET has followed with Dr. Fleeta Ebbing. She had previously been on IFN (0.25mg  q14d, with intent to put her in molecular remission), which has been held due to anxiety, and pactrinib was considered. Per telephone note from Dr. Fleeta, he intended to restart her IFN 11/28/2023. Prior to being on interferon, she was on hydrea , which was discontinued due to increased MPN symptoms, and after that was on ruxolitinib , discontinued due to anemia. She was then put on momelotinib, which did raise her hemoglobin, but she developed neuropathy so she was switched to IFN. This was held in the context of anxiety. Plan was to potentially discuss restarting IFN, pacritinib (though now platelet count is increased), or switching back to hydrea , which may be the most reasonable option for her given she does says her anxiety was severely exacerbated by IFN. She will see Dr. Fleeta Ebbing in 1 month who will discuss these various options for her. For now, in the context of this hospitalization ok to monitor off of therapy for ET.      Christy  Outpatient follow-up for ET already scheduled in February  Plan not to initiate new therapy while inpatient, but she will continue to discuss with Dr. Fleeta Ebbing  I have made Dr. Fleeta Ebbing aware of her admission  Appreciate NSGY input regarding aspirin. We typically start these patients on aspirin therapy. However, given increase in size of chronic subdural, reasonable to defer decision to resume ASA to outpatient setting, weighing risks and benefits       We will sign off. This patient was seen by myself and discussed with Dr. Darcy. These Christy were discussed with the primary team.     Please contact the hematology fellow at [phone] with any further questions.    ---  Nori Ouch, MD  [facility]  PGY-5 Fellow  -------------------------------------------------------------    HPI: STEPHENIE NAVEJAS is a 32 y.o. person as above and who is being seen at the request of Larraine Marcos Louder, MD for evaluation of the above issues.    The patient presented to the ED 02/26/2024 with suicidal ideation and worsening confusion. She had recently been admitted to [facility] for self-harm. She was discharged 02/21/2024. The patient takes psychiatric medications including latuda , remeron , and seroquel . CT head with contrast during this admission showed bilateral chronic appearing subdural collections, possibly larger from prior compared to report 02/09/2024. She is currently admitted to the psychiatric unit for close observation.    Most recent labs 02/26/2024 show WBC 11.1, Hgb 11.1, Plt 636 which is higher than recent baseline 450 - 500. Seems to be rising since 12/16/2023 when it was 498.     Presently, she denies clear MPN symptoms like night sweats, pruritus, bone pain, fatigue, though she has lost weight since last seen 11/2023, unclear extent to which comorbid psychiatric  condition is contributing to this.       Review of Systems: All positive and pertinent negatives are noted in the HPI; otherwise all other systems are negative    Hematology/Oncology History    No problem history exists.       Past Medical History[1]    Past Surgical History[2]    Family History[3]    Social History [4]    Allergies: is allergic to escitalopram .    Medications:   Meds:Scheduled Medications[5]  Continuous Infusions:Infusions Meds[6]  PRN Meds:.PRN Medications[7]    [No matching plan found]    [No matching plan found]    Objective:   Vitals: Temp:  [36.6 ??C (97.9 ??F)-36.9 ??C (98.4 ??F)] 36.9 ??C (98.4 ??F)  Pulse:  [93-95] 93  Resp:  [18] 18  BP: (113-132)/(62-73) 113/62  MAP (mmHg):  [77-89] 77  SpO2:  [97 %-100 %] 100 %    Physical Exam:  General appearance - alert, well appearing, and in no distress   Eyes - pupils equal and reactive, extraocular eye movements intact, sclera anicteric   Pulmonary - breathing comfortably   Cardiovascular- warm, well perfused   Gastrointestinal - deferred   Neurological - alert, normal speech, no focal findings or movement disorder noted       Test Results  Recent Labs     02/26/24  1845   WBC 11.1   NEUTROABS 8.3*   HGB 11.1*   MCV 87.3   PLT 636*       Imaging: Radiology studies were personally reviewed  XR Chest 2 views  Result Date: 02/26/2024   Impression: No acute abnormality.     XR Shunt Series  Result Date: 02/26/2024   Impression: Radiographically intact sophysa VP shunt programmed to position 5.    CT head WO contrast  Result Date: 02/26/2024   Impression: Bilateral chronic appearing subdural collections in the presence of shunted ventricles. Comparing to the previous report from 02/09/2024 it would seem the collections may be larger. If those prior images could be provided we can make a direct comparison. Ventricular shunt catheter positioning as described above without evidence of ventricular enlargement.                        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    Placed 11.2023  Sophysea  150 is setting 11.2023    Vitamin D deficiency    [2]   Past Surgical History:  Procedure Laterality Date    AUGMENTATION MAMMAPLASTY Bilateral     24ish yrs    BREAST BIOPSY Left     malignant    CHEMOTHERAPY      MASTECTOMY Left 25 ish yrs ago    PR CREATE SHUNT:VENTRIC-PERITONEAL Right 12/30/2021    Procedure: CREAT SHUNT; VENTRICULO-PERITONEAL/PLEURAL;  Surgeon: Kari Donnice Gaster, MD;  Location: MAIN OR [place];  Service: Neurosurgery    PR LAP, SURG ENTEROLYSIS Right 12/30/2021    Procedure: LAPAROSCOPY, SURGICAL, ENTEROLYSIS (FREEING OF INTESTINAL ADHESION) (SEPARATE PROCEDURE);  Surgeon: Carlin Curtistine Route, MD;  Location: MAIN OR [place];  Service: Trauma    RADIATION Left    [3]   Family History  Problem Relation Age of Onset    Cancer Mother     Heart disease Mother     Colon cancer Father     Cancer Father     No Known Problems Sister     No Known Problems Brother     BRCA 1/2 Daughter     BRCA 1/2 Daughter     BRCA 1/2 Daughter     No Known Problems Son     No Known Problems Maternal Grandmother     No Known Problems Maternal Grandfather     No Known Problems Paternal Grandmother     No Known Problems Paternal Grandfather     No Known Problems Other     Breast cancer Neg Hx     Endometrial cancer Neg Hx     Ovarian cancer Neg Hx    [4]   Social History  Socioeconomic History    Marital status: Divorced   Tobacco Use    Smoking status: Former     Types: Cigarettes     Passive exposure: Never    Smokeless tobacco: Never   Vaping Use    Vaping status: Never Used   Substance and Sexual Activity    Alcohol use: Not Currently     Comment: rarely    Drug use: Not Currently   Social History Narrative    PSYCHIATRIC HX:     -Current provider(s):  Floy Oneil Riis, MD [facility])    -Suicide attempts/SIB: Attempts: YES, December 2025 via cutting her left wrist.     -Psych Hospitalizations:  YES, [facility] for approximately 2 weeks ago in December    -Med compliance hx: Fair SUBSTANCE ABUSE HX:     -Current using substance: NO    -Hx w/d sxs: NO    -Sz Hx: NO    -DT Hx:NO        SOCIAL HX:    -Current living environment: Lives alone with 2 cats    -Current support(s): children    -Violence (perp): NO    -Access to Firearms: NO        -Guardian: NO        -Trauma: YES, childhood     Social Drivers of Health     Food Insecurity: No Food Insecurity (02/09/2024)    Received from [facility]    Hunger Vital Sign     Within the past 12 months, you worried that your food would run out before you got the money to buy more.: Never true     Within the past 12 months, the food you bought just didn't last and you didn't have money to get more.: Never true   Tobacco Use: Medium Risk (02/24/2024)    Patient History     Smoking Tobacco Use: Former     Smokeless Tobacco Use: Never     Passive Exposure: Never   Transportation Needs: No Transportation Needs (02/09/2024)    Received from [facility] - Transportation     In the past 12 months, has lack of transportation kept you from medical appointments or from getting medications?: No     In the past 12 months, has lack of transportation kept you from meetings, work, or from getting things needed for daily living?: No   Housing: Low Risk (10/15/2023)    Housing     Within  the past 12 months, have you ever stayed: outside, in a car, in a tent, in an overnight shelter, or temporarily in someone else's home (i.e. couch-surfing)?: No     Are you worried about losing your housing?: No   Utilities: Not At Risk (02/09/2024)    Received from [facility] Util[facility]     In the past 12 months has the electric, gas, oil, or water company threatened to shut off services in your home?: No   Interpersonal Safety: Patient Unable To Answer (08/24/2023)    Interpersonal Safety     Unsafe Where You Currently Live: Patient unable to answer     Physically Hurt by Anyone: Patient unable to answer     Abused by Anyone: Patient unable to answer   Social Connections: Socially Isolated (02/09/2024)    Received from [facility]    Social Connection and Isolation Panel     Frequency of Communication with Friends and Family: Three times a week     Frequency of Social Gatherings with Friends and Family: Three times a week     How often do you attend church or religious services?: Never     Do you belong to any clubs or organizations such as church groups, unions, fraternal or athletic groups, or school groups?: No     How often do you attend meetings of the clubs or organizations you belong to?: Never     Marital Status: Divorced   Physicist, Medical Strain: Patient Declined (10/15/2023)    Overall Financial Resource Strain (CARDIA)     Difficulty of Paying Living Expenses: Patient declined   Recent Concern: Financial Resource Strain - Medium Risk (09/23/2023)    Received from [facility]    Overall Financial Resource Strain (CARDIA)     Difficulty of Paying Living Expenses: Somewhat hard   Internet Connectivity: Internet connectivity concern identified (10/15/2023)    Internet Connectivity     Do you have access to internet services: No   [5]    atorvastatin   80 mg Oral Daily    lurasidone   20 mg Oral Daily    mirtazapine   30 mg Oral Nightly    pantoprazole   20 mg Oral Daily before breakfast    thiamine  mononitrate (vit B1)  100 mg Oral Daily    traZODone   150 mg Oral Nightly   [6] [7] melatonin, QUEtiapine 

## 2024-02-29 NOTE — H&P (Signed)
 [facility]   Psychiatry   History & Physical    Admit date/time: 02/29/2024 3:49 PM  Admitting Service: Psychiatry  Admitting Attending: See attestation    Assessment:   Christy Buchanan is a 69 y.o., White race, Not Hispanic, Latino/a, or Spanish origin ethnicity,  ENGLISH speaking female with a history of self-reported bipolar II, anxiety, MDD, and multiple complex medical illnesses, being admitted to [facility] Psychiatric inpatient service.  They have been seen and evaluated by [facility] ED clinician and by the Psychiatry Emergency Services team. They have been referred for admission to [facility] inpatient psychiatric unit for worsening depression and poor self-care. The patient originally presented to the ED voluntarily. A thorough review of the patient's medical work-up including labs and imaging, their ED course, their psychiatric evaluation in the ED, medications, allergies, and orders has been completed.  Referral for hospitalization was pursued given reported symptoms of self-harm and poor self-care. While in the ED, there were 0 instances of restraints or forced medications. Home medications, including Latuda , Remeron , and trazodone , were continued. Hospitalization remains warranted and will be continued.  Changes to the admitting plan/work-up are documented below.    The patient's presentation, including symptoms of low mood, difficulty sleeping, anhedonia, weight loss, difficulty concentrating, anxiety, and recent suicide attempt (via cutting wrists), appears to be most consistent with a diagnosis of major depressive disorder. Could consider bipolar disorder given reported history; however, lack of any clear hypomanic or manic episodes in her history make this less likely. Should also consider the contribution of trauma symptoms given diagnosis of PTSD, history of trauma, and reports of nightmares and hypervigilance. Must also consider contribution of psychosocial stressors, as per daughter patient's anxiety significantly worsened after receiving court summons for financial stressors in October.    Additionally, patient and daughters report sub-acute cognitive decline (confusion, inability to complete IDLs, getting lost in familiar situations) over the last year that abruptly and significantly worsened in December. She was receiving work-up for potential shunt dysfunction with her outpatient neurosurgeon, who she sees for a history of NPH, but did not complete imaging due to anxiety. Plan to consult neurosurgery for further work-up. Should also consider potential underlying neurocognitive disorder in addition to shunt dysfunction given MRI from 2023 with chronic small vessel ischemic changes. Will consider repeat imaging and neurocognitive testing this admission.    Medically, patient was found to have a shallow lesion with rolled edges on her left shoulder she was not aware. She also reports a dry cough for the last 2-3 weeks with a smoking history. Additionally, patient has lost 15 lbs since October, which raises concern for potential underlying medical problem. While her weight loss is likely in part due to mood, the rapidity of weight loss is concerning. Will consult Internal Medicine for assistance with evaluation and management, recs appreciated. Given current BMI of 16.7, plan to monitor for refeeding syndrome and consult nutrition.    Plan to continue outpatient medication regimen. Notably, patient was recent switched to Latuda  as she has had good effect from this medication previously. However, this medication's absorption is dependent on caloric intake and given patient's reported of minimal PO, it may not have been therapeutic. Will continue mirtazapine  for sleep, mood, and appetite stimulation, as well as Trazodone  for sleep.     Risk Assessment:  A suicide and violence risk assessment was performed as part of this evaluation. Specific questioning about thoughts, plans, suicidal intent, and self-harm  Suicidal ideation with no intent or plan. Risk  factors for self-harm/suicide: feelings of hopelessness, sense of isolation, and self-harming behaviors (cutting). Protective factors against self-harm/suicide: supportive family, sense of responsibility to family and social supports, and presence of an available support system. Risk factors for harm to others: N/A. Protective factors against harm to others: no known history of threats of harm towards others, no active symptoms of psychosis, no active symptoms of mania, and connectedness to family. Inpatient hospitalization for stabilization, safety, and consideration of psychotropic medication regimen is warranted. It is important to note that future behaviors cannot be accurately predicted.    Diagnoses:   Active Problems:    Severe episode of recurrent major depressive disorder, without psychotic features    (CMS-HCC)    Anxiety       Plan:  Safety:  -- Admit to inpatient psychiatric unit for safety, stabilization, and treatment.  -- Electronics Engineer    -- Observation Level:  Based on my clinical evaluation, I estimate the patient to be at low risk for suicide in the current setting  At this time, recommend q15 minute level of observation  This decision is based on my review of the chart including patient's history and current presentation, interview of the patient, mental status examination, and consideration of suicide risk including evaluating suicidal ideation, plan, intent, suicidal or self-harm behaviors, risk factors, and protective factors. This will be reassessed if there is a clinically significant change in the status of the patient. This judgment is based on our ability to directly address suicide risk, implement suicide prevention strategies and develop a safety plan while the patient is in the clinical setting.    During this time of COVID 19 and in following the overall hospital protocol, the use of a procedural mask as a medical device is indicated to limit the spread of the disease.    Psychiatry:    # MDD  -- Latuda  20 mg daily  -- Mirtazapine  30 mg nightly  -- Trazodone  150 mg nightly    # Self-Inflicted Wrist Wound   -- Consult wound nurse    # Therapy Interventions:  -- RT, OT, Milieu therapy    Medical:    #Nutritional Rehabilitation  -- Start 100mg  Vitamin B1  -- BMP, Mg, Phos q24 labs to monitor for refeeding syndrome  -- Consult dietician    # HLD  -- continue home 80mg  atorvastatin     #GERD  -- continue home 20mg  pantoprazole      #Constipation  -- start nightly senna per patient report    #NPH, sp shunt (2023)  --Consult neurosurgery, recs appreciated    #Essential Thrombocythemia   --Consult hematology, recs appreciated           # Lab Review:  -- Labs were reviewed on admission, including: CBC, CMP, TSH, Blood alcohol level, Urinalysis , and Urine toxicology screen  -- EKG: QTc of 465  -- Additional labs ordered as part of this evaluation include: Vitamin D level, Vitamin B12 level, and Folate level      Social/Disposition:  -- Continue hospitalization at this time.   -- Primary team to follow up with family, outpatient resources    Patient discussed with the psychiatry attending on-call. Please see attestation.     Noretta Cong, MD  This note was initiated by Allan Fairly, MS3      Subjective:    Identifying Information: Patient is a 69 y.o., White race, Not Hispanic, Latino/a, or Spanish origin ethnicity,  ENGLISH speaking female  who is being admitted to  [facility] inpatient psychiatry.     HPI From Initial Psychiatry Consult Note:  Christy Buchanan presented with high anxiety and chronic medical issues, reporting significant fatigue. She lives alone with two cats and finds increasing difficulty with isolation. She expressed regret over a prior suicide attempt that resulted in a two-week hospitalization at [facility]. She states, it's tiring and I am highly anxious about everything. Patient reports having a difficult time getting her thoughts together, which has led to frustrations and fear. Current medications include trazodone  150mg  nightly, mirtazapine  30mg  nightly, quetiapine  12.5mg  BID, and atorvastatin ; latuda  was discontinued two days ago, and she is unable to tolerate escitalopram . She reported sleep disturbance with early awakening, brain fog, irritability, inability to focus, and cognitive fuzziness. Over the past year, she lost 20 lbs and is attempting to maintain her weight; appetite is decreased and she forces herself to eat. She experienced a cough for two weeks. Daily sadness, amotivation, low energy, excessive worry, pacing, and loneliness were noted, with daily crying described as dry tears. 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 She denied suicidal or homicidal ideation, intent, or access to firearms, as well as substance use and auditory/visual hallucinations. She has a history of childhood trauma and diagnoses of major depressive disorder (MDD) and generalized anxiety disorder (GAD). Her daughter resides in [place], [place] , and [place] .     HPI on Interview: Patient interviewed on arrival to the inpatient unit.  They report, not doing well. She expresses difficulty with memory since the summer, feeling like she wants to be recluse and like she cannot keep up with her day to day. She endorses still being able to shower, and cook although most of her meals are microwaved. She does not drive anymore. She has a history of NPH and a shunt placement in 2022.     She lives alone in a house she has owned for 10 years with two cats but she plans to rehome her cats. 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 She has three daughters one of whom lives in [place], [place] and one who lives in [place] .     Sleep has not been great and only is able to sleep with her nightly trazodone  150mg  and mirtazapine  30mg . Despite the medication, she endorses waking up throughout the night. Her appetite has also decreased since she has moved down from [place]  because she does not like the food down here with lots of breads. When asked for 24-hour recall, patient was cagey and stated that she tries to eat the following: yogurt, fruits, and chocolate ensure-like drink for breakfast, and some lunch that usually consists of more yogurt and some soup. When asked about dinner, patient states she does not eat meat but does not share what she eats instead.    In terms of prior psychiatric history, she has been told she has bipolar and was on Latuda  but did not like the side effects of drooling. She switched to Abilify but is back on the Latuda . She has previously tried lithium 20-30 years ago for bipolar but cannot remember if it was helpful. She denies any episodes of increased energy or sleepless nights. She denied any legal involvement but did endorse bad financial decisions. She also has tried zoloft  but she said it feels like they jogged her up. She also mentioned she was unable to be on Wellbutrin given her increased SI history.     In terms of her prior medical history, she has essential thrombocythemia and was on interferon and Momelotinib  but is no longer on any medication for these. She also has a history of breast cancer and mitral valve prolapse. She endorsed a history of prior trauma.           ----------------    Collateral:  Spoke to daughter Sarah Waycott on the phone for 15 minutes. They have noticed for the last 2 months that her panic and aniety have been really bad. Since early December she has been constatnly pacing around the house, not eating, and repeating I'm not doing well to her. She flew out from [place]  to spend some time with her before Christmas and realized that she seems very panicky, she has been having major anxiety attack with screaming and crying. She wouldn't eat. She also had cogntive difficulties where they would have a conversation and she wouldn't remember 20 min later. She tried to take her mother home to [place] for Christmas and her mother was very nervous to fly. They gave her some Seroquel  to help keep her calm while flying. They were sitting down at the airport and she said she needed to use the bathroom. She was taking a really long time and then daughter heard her name over the intercom. She got very lost just in the short walk from the bathroom to the seats. At Christmas she keeps pacing around the house, stressing about every little thing, having panic attacks. She has an episode of screaming, crying, confusion. She has 2 days by herself after the holidays to relax, but then daughter gets a call that she cut her wrist. She then spent a couple week at [facility] mental health facility. Patient has never tried to hurt herself or take her own life before. After discharge, they tried to get her to a PHP for mental health and [name] (other daughter) is helping her daily, but can't keep up with patient's needs. They get home care ladies to help her with IDLs and ADLs, but patient gets upset and thinks that they are stealing from her. Patient then admitted to daughter that she was having more suicidal thoughts and wasn't safe to be alone. Daughter told the home care company who drove her to [facility] for safety. There has been a drastic change in behavior and cognition over the last two months. She is having a really hard time understanding conversations. Couldn't remember which taps are hot or cold in a house she's lived in for decades. She has some decline over the last few years with difficulty handling a lot of noise and wanting to be less social, but it was very slow decline. The last 2 months were abrupt and not normal and a very steep decline.     Daughter warns that her mother tends to present very well and she is worried she will hide her deficits. At [facility], she was able to pretend she was much less impaired than she is. The family does not feel she is safe to live on her own anymore. Patient cannot live with daughters, they are unable to accommodate her. They are looking into placement at a facility in [place] , patient is not aware of this plan however. Family would like to see what is possible before they tell her about  the plans.    ---------------  Spoke to daughter Duwaine Brandy for 25 minutes via telephone. Her daughter shared that they were supposed to get nuclear imaging done for the shunt, and her mom panicked when she heard that it may be up to 2 hours trapped in a small space. Her fight or flight response has been getting triggered by everything all the time recently, she panics very quickly. They cannot get her to settle back down, difficulty getting her to calm down. She would need sedation for imaging. Four years ago patient has a noticeable decline in cognitive functioning that looked very similar to this one. Once she got the shunt, she seemed much better, her cognition improved rapidly and daughter feels like she got her mom back. Patient has had a rapid decline over the last 2 months with cognitive decline that makes her worry about shunt issues. Confusion, in a fog, can't think straight. However, she is not having urinary issues or walking problems. And now her symptoms are worse in the evening, seems to be sundowning. She seems confused and overwhelmed even with basic tasks sometimes, but other times seems more with it and understands things. Not sure if some of the difficulty is from nervous system overload and overwhelm. She has had some increase in forgetfulness for many years, but not like this. She will panic and worry someone wants to sue her because she can't contact her clients. She is constantly obsessing over things, there is always something that she is going around and around about.     She has history of high platelets that was well controlled with Interferon, but was discontinue last year due to patient's anxiety. Nothing improved anxiety wise since then but her platelets have increased rapidly. Abrupt change in October/ November, even before December. Patient's financial situation is not great and was taken advantage of a debt company that told her to not pay her credit card company. She got two court summons to her door back-to-back that seemed to send her into a tail-spin and her anxiety has been out of control after that time.    Allergies: Reviewed and updated  Escitalopram     Medications: Reviewed and updated  Current Medications[1] Prescriptions Prior to Admission[2]    Medical History:Reviewed and updated  Past Medical History[3]    Surgical History: Reviewed and updated  Past Surgical History[4]    Social History: Reviewed and updated  Social History [5]    Family History: Reviewed and updated  The patient's family history includes BRCA 1/2 in her daughter, daughter, and daughter; Cancer in her father and mother; Colon cancer in her father; Heart disease in her mother; No Known Problems in her brother, maternal grandfather, maternal grandmother, paternal grandfather, paternal grandmother, sister, son, and another family member..    Code Status:   Full Code    ROS:   Constitutional: Denies fever/chills  HEENT: Denies dysphagia and Endorses hearing change  Heme/Lymph: Denies Endorses Negative and weight loss  Cardiac: Denies chest pain  Lungs: Denies shortness of breath  and Endorses cough  GI: Denies nausea, vomiting , and Endorses constipation  GU: Denies dysuria  Neuro: Denies involuntary movements, tremor, weakness/numbness, headaches, and dizziness    Objective:     Vitals:   Patient Vitals for the past 12 hrs:   BP Temp Temp src Pulse Resp SpO2 Height Weight   02/29/24 1009 113/62 36.9 ??C (98.4 ??F) Temporal 93 18 100 % 165.1 cm (5' 5) 45.5 kg (100 lb  4.8 oz) 02/29/24 0645 132/73 36.8 ??C (98.2 ??F) Oral 95 -- 97 % -- --         Mental Status Exam:  Appearance:    No apparent distress, Appears stated age, Well developed, and Clean/Neat   Attitude/Behavior:   Calm and Cooperative   Psychomotor:   No abnormal movements   Speech/Language:    Normal rate, volume, tone, fluency and Language intact, well formed   Mood:   Not good   Affect:   Anxious, Calm, Dysthymic, and Guarded   Thought process:   Logical, linear, clear, coherent, goal directed   Thought content:     Does not endorse SI, HI, self harm, delusions, obsessions, paranoid ideation, or ideas of reference. Endorses recent suicide attempt via cutting wrists.   Perceptual disturbances:     Denies auditory and visual hallucinations and Behavior not concerning for response to internal stimuli   Orientation:   Grossly oriented   Attention:   Able to fully concentrate and attend   Concentration   Concentration grossly intact, did not formally assess   Memory:   Difficult to assess as patient is an unreliable historian    Fund of knowledge:    Not formally assessed   Insight:     Fair   Judgment:    Impaired in the setting of cognitive difficulties   Impulse Control:   Fair     PE:   Gen: No acute distress  CV: Regular rate and rhythm  Pulm: Clear to auscultation bilaterally  Skin: Bandage noted covering right wrist   Motor Exam: Normal bulk. No tremors, myoclonus, or other adventitious movement. Moving all extremities equally and spontaneously.       Test Results:  Data Review: I have reviewed the recent labs from this patient's current encounter.  Results for orders placed or performed during the hospital encounter of 02/26/24   Influenza/ RSV/COVID PCR    Specimen: Nasopharyngeal Swab   Result Value Ref Range    SARS-CoV-2 PCR Negative Negative    Influenza A Negative Negative    Influenza B Negative Negative    RSV Negative Negative   Urinalysis with Microscopy   Result Value Ref Range    Color, UA Colorless Clarity, UA Clear     Specific Gravity, UA 1.008 1.003 - 1.030    pH, UA 5.0 5.0 - 9.0    Leukocyte Esterase, UA Negative Negative    Nitrite, UA Negative Negative    Protein, UA Negative Negative    Glucose, UA Negative Negative    Ketones, UA Negative Negative    Urobilinogen, UA <2.0 mg/dL <7.9 mg/dL    Bilirubin, UA Negative Negative    Blood, UA Negative Negative    RBC, UA <1 <=4 /HPF    WBC, UA <1 0 - 5 /HPF    Squam Epithel, UA 1 0 - 5 /HPF    Bacteria, UA Rare (A) None Seen /HPF    Mucus, UA Rare (A) None Seen /HPF   TSH   Result Value Ref Range    TSH 1.658 0.550 - 4.780 uIU/mL   Ethanol   Result Value Ref Range    Alcohol, Ethyl <10 <=10 mg/dL   Comprehensive Metabolic Panel   Result Value Ref Range    Sodium 142 135 - 145 mmol/L    Potassium 3.7 3.4 - 4.8 mmol/L    Chloride 102 98 - 107 mmol/L    CO2 26.0 20.0 - 31.0 mmol/L  Anion Gap 14 5 - 14 mmol/L    BUN 21 9 - 23 mg/dL    Creatinine 9.28 9.44 - 1.02 mg/dL    BUN/Creatinine Ratio 30     eGFR CKD-EPI (2021) Female >90 >=60 mL/min/1.59m2    Glucose 120 70 - 179 mg/dL    Calcium 9.5 8.7 - 89.5 mg/dL    Albumin 3.3 (L) 3.4 - 5.0 g/dL    Total Protein 7.1 5.7 - 8.2 g/dL    Total Bilirubin 0.4 0.3 - 1.2 mg/dL    AST 21 <=65 U/L    ALT 26 10 - 49 U/L    Alkaline Phosphatase 101 46 - 116 U/L   Toxicology Screen, Urine   Result Value Ref Range    Amphetamines Screen, Ur Negative <500 ng/mL    Barbiturates Screen, Ur Negative <200 ng/mL    Benzodiazepines Screen, Urine Negative <200 ng/mL    Cannabinoids Screen, Ur Negative <20 ng/mL    Methadone Screen, Urine Negative <300 ng/mL    Cocaine(Metab.)Screen, Urine Negative <150 ng/mL    Opiates Screen, Ur Negative <300 ng/mL    Fentanyl  Screen, Ur Negative <1.0 ng/mL    Oxycodone  Screen, Ur Negative <100 ng/mL    Buprenorphine, Urine Negative <5 ng/mL   ECG 12 Lead   Result Value Ref Range    EKG Systolic BP  mmHg    EKG Diastolic BP  mmHg    EKG Ventricular Rate 93 BPM    EKG Atrial Rate 93 BPM    EKG P-R Interval 118 ms    EKG QRS Duration 92 ms    EKG Q-T Interval 374 ms    EKG QTC Calculation 465 ms    EKG Calculated P Axis 83 degrees    EKG Calculated R Axis 81 degrees    EKG Calculated T Axis 77 degrees    QTC Fredericia 433 ms   CBC w/ Differential   Result Value Ref Range    WBC 11.1 3.6 - 11.2 10*9/L    RBC 3.78 (L) 3.95 - 5.13 10*12/L    HGB 11.1 (L) 11.3 - 14.9 g/dL    HCT 66.9 (L) 65.9 - 44.0 %    MCV 87.3 77.6 - 95.7 fL    MCH 29.4 25.9 - 32.4 pg    MCHC 33.7 32.0 - 36.0 g/dL    RDW 86.1 87.7 - 84.7 %    MPV 7.5 6.8 - 10.7 fL    Platelet 636 (H) 150 - 450 10*9/L    Neutrophils % 75.0 %    Lymphocytes % 13.2 %    Monocytes % 9.6 %    Eosinophils % 1.2 %    Basophils % 1.0 %    Absolute Neutrophils 8.3 (H) 1.8 - 7.8 10*9/L    Absolute Lymphocytes 1.5 1.1 - 3.6 10*9/L    Absolute Monocytes 1.1 (H) 0.3 - 0.8 10*9/L    Absolute Eosinophils 0.1 0.0 - 0.5 10*9/L    Absolute Basophils 0.1 0.0 - 0.1 10*9/L     Imaging: Radiology report(s) reviewed.    Psychometrics:  To be completed per unit protocol    Time-based billing disclaimer:  I personally spent 120   minutes face-to-face and non-face-to-face in the care of this patient, which includes all pre, intra, and post visit time on the date of service.  All documented time was specific to the E/M visit and does not include any procedures that may have been performed.               [  1]   Current Facility-Administered Medications   Medication Dose Route Frequency Provider Last Rate Last Admin    atorvastatin  (LIPITOR ) tablet 80 mg  80 mg Oral Daily Rojelio Kayla ORN, MD   80 mg at 02/29/24 9190    lurasidone  (LATUDA ) tablet 20 mg  20 mg Oral Daily Randall, Matthew E, PMHNP   20 mg at 02/29/24 0809    melatonin tablet 3 mg  3 mg Oral Nightly PRN Adin Leita HERO, MD        mirtazapine  (REMERON ) tablet 30 mg  30 mg Oral Nightly West, Cheryll A, PMHNP   30 mg at 02/28/24 2026    pantoprazole  (Protonix ) EC tablet 20 mg  20 mg Oral Daily before breakfast Rojelio Kayla ORN, MD 20 mg at 02/29/24 0732    QUEtiapine  (SEROQUEL ) tablet 12.5 mg  12.5 mg Oral BID PRN Shrey Boike M, MD        senna NALANI) tablet 1 tablet  1 tablet Oral Nightly Adin Leita HERO, MD        thiamine  mononitrate (vit B1) tablet 100 mg  100 mg Oral Daily Rojelio Kayla ORN, MD   100 mg at 02/29/24 0809    traZODone  (DESYREL ) tablet 150 mg  150 mg Oral Nightly West, Shela A, PMHNP   150 mg at 02/28/24 2026   [2]   Medications Prior to Admission   Medication Sig Dispense Refill Last Dose/Taking    acetaminophen  (TYLENOL ) 325 MG tablet Take 2 tablets (650 mg total) by mouth every four (4) hours as needed for fever (temp >38.5C).  0     atorvastatin  (LIPITOR ) 80 MG tablet Take 1 tablet (80 mg total) by mouth daily. 90 tablet 3     cyanocobalamin, vitamin B-12, (VITAMIN B-12 ORAL) Take by mouth daily.       docusate sodium  (STOOL SOFTENER ORAL) Take 1 tablet by mouth nightly.       ferrous sulfate  325 (65 FE) MG EC tablet Take 1 tablet (325 mg total) by mouth 3 (three) times a week. 30 tablet 3     lurasidone  (LATUDA ) 20 mg Tab Take 1 tablet (20 mg total) by mouth.       mirtazapine  (REMERON ) 30 MG tablet        omega-3 fatty acids -fish oil  300-1,000 mg cap capsule Take 1 capsule by mouth in the morning. (Patient not taking: Reported on 01/31/2024)       omeprazole  (PRILOSEC) 20 MG capsule Take 1 capsule (20 mg total) by mouth daily. 90 capsule 3     QUEtiapine  (SEROQUEL ) 25 MG tablet Take 1 tablet (25 mg total) by mouth two (2) times a day.       raloxifene  (EVISTA ) 60 mg tablet TAKE 1 TABLET DAILY 90 tablet 3     simethicone (MYLICON) 80 MG chewable tablet Chew 1 tablet (80 mg total) every six (6) hours as needed for flatulence.       traZODone  (DESYREL ) 150 MG tablet Take 1 tablet (150 mg total) by mouth nightly. 30 tablet 2     vitamins B1 B6 B12 Liqd Take by mouth every other day.      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    Placed 11.2023  [place]  150 is setting 11.2023    Vitamin D deficiency    [4]   Past Surgical History:  Procedure Laterality Date    AUGMENTATION MAMMAPLASTY Bilateral     23ish yrs    BREAST BIOPSY Left     malignant    CHEMOTHERAPY      MASTECTOMY Left 25 ish yrs ago    PR CREATE SHUNT:VENTRIC-PERITONEAL Right 12/30/2021    Procedure: CREAT SHUNT; VENTRICULO-PERITONEAL/PLEURAL;  Surgeon: Kari Donnice Gaster, MD;  Location: MAIN OR [place];  Service: Neurosurgery    PR LAP, SURG ENTEROLYSIS Right 12/30/2021    Procedure: LAPAROSCOPY, SURGICAL, ENTEROLYSIS (FREEING OF INTESTINAL ADHESION) (SEPARATE PROCEDURE);  Surgeon: Carlin Curtistine Route, MD;  Location: MAIN OR [facility];  Service: Trauma    RADIATION Left    [5]   Social History  Socioeconomic History    Marital status: Divorced   Tobacco Use    Smoking status: Former     Types: Cigarettes     Passive exposure: Never    Smokeless tobacco: Never   Vaping Use    Vaping status: Never Used   Substance and Sexual Activity    Alcohol use: Not Currently     Comment: rarely    Drug use: Not Currently   Social History Narrative    PSYCHIATRIC HX:     -Current provider(s):  Floy Oneil Riis, MD [facility])    -Suicide attempts/SIB: Attempts: YES, December 2025 via cutting her left wrist.     -Psych Hospitalizations:  YES, [facility] for approximately 2 weeks ago in December    -Med compliance hx: Fair        SUBSTANCE ABUSE HX:     -Current using substance: NO    -Hx w/d sxs: NO    -Sz Hx: NO    -DT Hx:NO        SOCIAL HX:    -Current living environment: Lives alone with 2 cats    -Current support(s): children    -Violence (perp): NO    -Access to Firearms: NO        -Guardian: NO        -Trauma: YES, childhood Social Drivers of Health     Food Insecurity: No Food Insecurity (02/09/2024)    Received from [facility]    Hunger Vital Sign     Within the past 12 months, you worried that your food would run out before you got the money to buy more.: Never true     Within the past 12 months, the food you bought just didn't last and you didn't have money to get more.: Never true   Tobacco Use: Medium Risk (02/24/2024)    Patient History     Smoking Tobacco Use: Former     Smokeless Tobacco Use: Never     Passive Exposure: Never   Transportation Needs: No Transportation Needs (02/09/2024)    Received from [facility] - Transportation     In the past 12 months, has lack of transportation kept you from medical appointments or from getting medications?: No     In the past 12 months, has lack of transportation kept you from meetings, work, or from getting things needed for daily living?: No   Alcohol Use: Not At Risk (02/29/2024)    Alcohol Use  How often do you have a drink containing alcohol?: Never     How many drinks containing alcohol do you have on a typical day when you are drinking?: 1 - 2     How often do you have 5 or more drinks on one occasion?: Never   Housing: Low Risk (10/15/2023)    Housing     Within the past 12 months, have you ever stayed: outside, in a car, in a tent, in an overnight shelter, or temporarily in someone else's home (i.e. couch-surfing)?: No     Are you worried about losing your housing?: No   Utilities: Not At Risk (02/09/2024)    Received from [facility] Utilities     In the past 12 months has the electric, gas, oil, or water company threatened to shut off services in your home?: No   Interpersonal Safety: Not At Risk (02/29/2024)    Interpersonal Safety     Unsafe Where You Currently Live: No     Physically Hurt by Anyone: No     Abused by Anyone: No   Social Connections: Socially Isolated (02/09/2024)    Received from [facility]    Social Connection and Isolation Panel Frequency of Communication with Friends and Family: Three times a week     Frequency of Social Gatherings with Friends and Family: Three times a week     How often do you attend church or religious services?: Never     Do you belong to any clubs or organizations such as church groups, unions, fraternal or athletic groups, or school groups?: No     How often do you attend meetings of the clubs or organizations you belong to?: Never     Marital Status: Divorced   Physicist, Medical Strain: Patient Declined (10/15/2023)    Overall Financial Resource Strain (CARDIA)     Difficulty of Paying Living Expenses: Patient declined   Recent Concern: Financial Resource Strain - Medium Risk (09/23/2023)    Received from [facility]    Overall Financial Resource Strain (CARDIA)     Difficulty of Paying Living Expenses: Somewhat hard   Internet Connectivity: Internet connectivity concern identified (10/15/2023)    Internet Connectivity     Do you have access to internet services: No

## 2024-03-01 LAB — BASIC METABOLIC PANEL
ANION GAP: 12 mmol/L (ref 5–14)
BLOOD UREA NITROGEN: 23 mg/dL (ref 9–23)
BUN / CREAT RATIO: 34
CALCIUM: 8.9 mg/dL (ref 8.7–10.4)
CHLORIDE: 106 mmol/L (ref 98–107)
CO2: 27 mmol/L (ref 20.0–31.0)
CREATININE: 0.68 mg/dL (ref 0.55–1.02)
EGFR CKD-EPI (2021) FEMALE: >90 mL/min/1.73m2 (ref >=60)
GLUCOSE RANDOM: 95 mg/dL (ref 70–179)
POTASSIUM: 4.4 mmol/L (ref 3.5–5.1)
SODIUM: 145 mmol/L (ref 135–145)

## 2024-03-01 LAB — VITAMIN B12: VITAMIN B-12: 596 pg/mL (ref 211–911)

## 2024-03-01 LAB — PHOSPHORUS: PHOSPHORUS: 3.7 mg/dL (ref 2.4–5.1)

## 2024-03-01 LAB — MAGNESIUM: MAGNESIUM: 2 mg/dL (ref 1.6–2.6)

## 2024-03-01 LAB — FOLATE: FOLATE: 14.1 ng/mL (ref >=5.4)

## 2024-03-01 MED ADMIN — traZODone (DESYREL) tablet 150 mg: 150 mg | ORAL | @ 02:00:00

## 2024-03-01 MED ADMIN — atorvastatin (LIPITOR) tablet 80 mg: 80 mg | ORAL | @ 13:00:00

## 2024-03-01 MED ADMIN — thiamine mononitrate (vit B1) tablet 100 mg: 100 mg | ORAL | @ 13:00:00

## 2024-03-01 MED ADMIN — lurasidone (LATUDA) tablet 20 mg: 20 mg | ORAL | @ 13:00:00

## 2024-03-01 MED ADMIN — pantoprazole (Protonix) EC tablet 20 mg: 20 mg | ORAL | @ 13:00:00

## 2024-03-01 MED ADMIN — mirtazapine (REMERON) tablet 30 mg: 30 mg | ORAL | @ 02:00:00

## 2024-03-01 MED ADMIN — QUEtiapine (SEROQUEL) tablet 12.5 mg: 12.5 mg | ORAL | @ 17:00:00

## 2024-03-01 MED ADMIN — menthol (COUGH DROPS) lozenge 1 lozenge: 1 | ORAL | @ 21:00:00

## 2024-03-01 NOTE — Plan of Care (Signed)
 Patient alert and oriented x4. Calm and cooperative. Med compliant. Vitals stable. Denies HI, AVH. When asked about SI, she says that she's sad. No falls this shift. No other safety or behavioral concerns at this time.     Shift Summary  Behavioral plan reviewed and boundaries reinforced, with no self-injurious or unsafe behaviors observed during the shift.   Senna was administered as scheduled.   Mobility and gait remained strong and coordinated, with independent activity and no falls.   Infection prevention and safety interventions were consistently implemented.   Overall, safety, mobility, and absence of new illness or injury were maintained throughout the shift.     Adheres to Safety Considerations for Self and Others: No new self-injurious or destructive behaviors were observed, and behavioral plans and boundaries were reinforced in a calm environment with safety interventions such as a locked unit and staff interactions maintained throughout the shift.     Absence of New-Onset Illness or Injury: No new wounds or illness were reported, and infection prevention measures such as hand hygiene and rest were promoted.     Absence of Fall and Fall-Related Injury: No falls or unsafe behaviors were observed, and fall precautions including hourly visual checks, room door open, and use of nonskid footwear were maintained; mobility remained independent and gait was balanced and coordinated.

## 2024-03-01 NOTE — Consults (Signed)
 WOCN Consult Services                                                                 Wound Evaluation     Reason for Consult:   - Initial  - Wound    Problem List:   Active Problems:    Severe episode of recurrent major depressive disorder, without psychotic features    (CMS-HCC)    Anxiety    Assessment: We were consulted for a wound to the left wrist. Patient assessed on 4 Gero. She has been keeping the wound covered with dry dressings; a silicone bordered foam dressing was in place today. The patient's left wrist is intact with pink hypopigmented skin and scar tissue. It does not require dressings and could be left open to air, although the patient stated a preference to keep it covered with dressings.     Wound 02/28/24 Other (Comment) Arm Anterior;Distal;Left;Lower (Active)   Properties   Placement Date 02/28/24   Placement Time 1001   Location Arm   Present on Original Admission Y   Primary Wound Type Other (superficial abrasions (from cutting))   Wound Location Orientation Anterior;Distal;Left;Lower      Assessments 03/01/2024  9:30 AM   Wound Image     Dressing Status      Clean;Dry;Intact/not removed   Dressing Silicone foam bordered dressing     Teaching:  - Wound care    WOCN Recommendations:   - The patient's left wrist is intact and may be left open to air, although she may cover it with dry dressings if this is her preference.    Topical Therapy/Interventions:   - Silicone bordered foam    Recommended Consults:  - Not Applicable    WOCN Follow Up:  - We will sign off at this time    Plan of Care Discussed With:   - Patient  - RN primary    Supplies Ordered: No    Workup Time:   30 minutes    Elsie Batty RN BSN  Wound, Ostomy and Continence Consult Service

## 2024-03-01 NOTE — Consults (Signed)
 OCCUPATIONAL THERAPY  Evaluation  03/01/2024  Patient Name: Christy Buchanan   Medical Record Number: [id number]   Date of Birth: 03-21-1955  Treatment Diagnosis: Decline in occupational participation in setting of suicidal ideation and apathy in context of cognitive decline, comorbid health conditions, and psychosocial stressors. Christy Buchanan presented 1/17 brought in by in-home, private duty caregiver with report of poor sleep, anhedonia, and passive suicidal ideation without a plan. Her history is significant for MDD without psychotic features, Normal Pressure Hydrocephalus s/p VP shunt, mild cognitive impairment with memory loss, Bipolar II, mixed conductive and sensorineural hearing loss of both ears.    OT Psych Session Duration   Individual [mins]: 45 Minutes            ASSESSMENT  Assessment - Patient presents: Christy Buchanan presents sitting in rocking chair in milieu alone, alert, and receptive to moderate complexity OT evaluation in private area of the unit. Throughout session, she was polite and engaged with flat affect, good eye contact, and noted short term memory deficits as evidenced by repetition of previously-discussed information. Christy Buchanan acknowledged reason for admit. OTR/L provided education on the role of therapy in the recovery process, working with Christy Buchanan to identify her priority goal areas. She verbalized understanding of education provided and identified routines, managing stress and anxiety, and community resources as her priority goal areas (selected from a list of topics). Christy Buchanan requires skilled OT services to address these areas as well as building insight, life skill participation, occupational balance, strategies for memory loss, emotion regulation, self-care, self-compassion, and relapse prevention.   Today's Interventions: Education - Patient    PLAN OF CARE  Plan of Care Initiated: 03/01/24  Daily Frequency: 1-2x per day Weekly Frequency: 3-4x week while in acute setting   Planned Treatment Duration: 03/22/24      Post Acute Discharge Recommendations: Low intensity  Additional Discharge Recommendations: Outpatient Occupational Therapy, Outpatient Mental Health Therapy, Community Support   OT DME Recommendations: Defer to post acute  Sensory Equipment Recommendations : Primary school teacher  Planned Interventions: Equities Trader, [employer], Special Educational Needs Teacher and social skills, Emotion regulation Skills, Sensory based strategies, Education - Patient, Functional cognition, Human resources officer, Relapse prevention, Public house manager      Patient/Family Goals:   Focus is the big thing for me.     Short Term Goals:   By 2/11 Christy Buchanan will engage in x4 self-reflective occupations, with 3 or fewer cues, to support insight, self-compassion, and engagement in meaningful roles/relationships.   Time Frame : 3 weeks   By 2/11 Christy Buchanan will identify/return demonstrate 3 effective techniques to support emotional regulation with fewer than 5 verbal cues in order to promote adaptive coping and improve occupational engagement.   Time Frame : 3 weeks   By 2/11 Christy Buchanan will identify 3 health management skills to use in their regular routine with fewer than 5 verbal cues in order to support overall psychosocial wellbeing and relapse prevention.   Time Frame : 3 weeks          Planned Interventions:  Equities Trader, [employer], Special Educational Needs Teacher and social skills, Emotion regulation Skills, Land based strategies, Education - Patient, Functional cognition, Human resources officer, Relapse prevention, Health management training    Prognosis: Fair   Positive Indicators: Support, Education history, Employment history (health literacy)  Barriers: Cognitive deficits, Lack of motivation, Medical co-morbidities, Unstable living environment (psychosocial stressors)    SUBJECTIVE  Patient / Caregiver reports: It's really hard for me to focus  Communication Preference: Verbal, Written, Visual  Current Medications[1]    Past Medical History[2]   Social History     Tobacco Use    Smoking status: Former     Types: Cigarettes     Passive exposure: Never    Smokeless tobacco: Never   Substance Use Topics    Alcohol use: Not Currently     Comment: rarely     Past Surgical History[3] Family History[4]    Allergies:   Escitalopram      OBJECTIVE   General     Pain Comments: Denies    Risk Factors  Risk Factors: None    Precautions / Restrictions  Precautions: Psych Safety precautions (reports multiple episodes of loss of balance over past year, and 1 big fall a couple months ago. 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 She reports that her occupational participation has been impacted by short term memory challenges, attention span, and sense of self-efficacy. 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 She used to enjoy crafting (painting and sewing) as well as studying Buddhism; however, she has had a hard time focusing her attention, and hasn't engaged in these activities in at least 6 months.    ROLES  Friend, Parent, Other art therapist, grandmother) SHOPPING  Needs assistance (reports independent at baseline)   ADLS  Independent CAREGIVING Needs assistance (reports she plans to re-home her cats due to difficulty with pet care.)   HOMEMAKING Needs assistance (reports independent at baseline) BILL PAYING and FINANCES  Needs assistance (reports independent at baseline)   MEAL PREP  Needs assistance (reports independent at baseline) DRIVING and COMMUNITY MOBILITY  Needs assistance (stopped driving about a year ago)   MEDICATION MANAGEMENT  Needs assistance (pt daughter oversees her medications) Education Leisure Centre Manager Other, Self employed (works as a programmer, multimedia)  Product/process Development Scientist, Home Living: Two level home (main level and basement (with laundry))  Lives With Other (2 cats; in-home private duty caregivers and pt adult daughter provide assistance.)  Leisure / Play not engaging routinely  Rest and Sleep Impaired (difficutly staying asleep)    CLIENT FACTORS     Spiritual Beliefs : Was raised Catholic but hasn't practiced since she was a teenager.  Cognition: A&O x person, place, grossly to situation. Short term memory deficits noted throughout session.  Sensory Functions: wears glasses; bilateral ear hearing loss  Body Structures: Recent fall history; low body weight    PARTICIPATION Active participation THOUGHT PROCESSES Coherent, Other (linear)   EYE CONTACT Good HALLUCINATION  Does not appear to be responding to internal stimuli   MOOD Anxious, Depressed DELUSIONS Possible delusional content   SPEECH  Clear SAFETY JUDGEMENT Acknowledgement of mental health diagnosis, Understanding of treatment options, Unable to identify warning signs of crisis/decompensation         PERFORMANCE SKILLS     MOTOR SKILLS appears to be [facility] SOCIAL INTERACTION SKILLS functional   PROCESS SKILLS transitions between activities, impaired, impaired attention, impaired decision making, impaired organization (impaired initiation) EMOTION REGULATION SKILLS Anxious, Decreased frustration tolerance, impaired, Impulsive         PERFORMANCE PATTERNS  Habits - Useful: baseline I/ADL participation; health literacy; familiarity with mental health recovery; education  Habits - Impoverished: self-care; insight; cognition  Habits - Dominating: cognitive decline; psychosocial stressors  Routines - Satisfying: pt unable to identify satisfying routines x 1 year  Routines - Damaging: cognitive decline; psychosocial stressors      I attest that I have reviewed the above information.  Signed: Darice Collie, OT  Filed 03/01/2024        [1]   Current Facility-Administered Medications   Medication Dose Route Frequency Provider Last Rate Last Admin    atorvastatin  (LIPITOR ) tablet 80 mg  80 mg Oral Daily Rojelio Kayla ORN, MD   80 mg at 03/01/24 9193    lurasidone  (LATUDA ) tablet 20 mg  20 mg Oral Daily Sheena Donnice BRAVO, PMHNP   20 mg at 03/01/24 0757    melatonin tablet 3 mg  3 mg Oral Nightly PRN Adin Leita HERO, MD        menthol  (COUGH DROPS) lozenge 1 lozenge  1 lozenge Oral Q4H PRN [place] Gustav MATSU, MD   1 lozenge at 03/01/24 1550    mirtazapine  (REMERON ) tablet 30 mg  30 mg Oral Nightly West, Cheryll A, PMHNP   30 mg at 02/29/24 2033    pantoprazole  (Protonix ) EC tablet 20 mg  20 mg Oral Daily before breakfast Rojelio Kayla ORN, MD   20 mg at 03/01/24 0757    QUEtiapine  (SEROQUEL ) tablet 12.5 mg  12.5 mg Oral BID PRN Adin Leita HERO, MD   12.5 mg at 03/01/24 1212    senna (SENOKOT) tablet 1 tablet  1 tablet Oral Nightly Adin Leita HERO, MD        thiamine  mononitrate (vit B1) tablet 100 mg  100 mg Oral Daily Rojelio Kayla ORN, MD   100 mg at 03/01/24 9193    traZODone  (DESYREL ) tablet 150 mg  150 mg Oral Nightly Devora Cheryll A, PMHNP   150 mg at 02/29/24 2034   [2]   Past Medical History:  Diagnosis Date    Autoimmune thyroiditis     Breast cancer    (CMS-HCC)     Difficult intravenous access     GERD (gastroesophageal reflux disease)     Hyperlipidemia     Osteoporosis     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    Sjogren's disease (HHS-HCC)     Thrombocythemia     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    Placed 11.2023  [place]  150 is setting 11.2023    Vitamin D deficiency    [3]   Past Surgical History:  Procedure Laterality Date    AUGMENTATION MAMMAPLASTY Bilateral     23ish yrs    BREAST BIOPSY Left     malignant    CHEMOTHERAPY      MASTECTOMY Left 25 ish yrs ago    PR CREATE SHUNT:VENTRIC-PERITONEAL Right 12/30/2021    Procedure: CREAT SHUNT; VENTRICULO-PERITONEAL/PLEURAL; Surgeon: Kari Donnice Gaster, MD;  Location: MAIN OR [place];  Service: Neurosurgery    PR LAP, SURG ENTEROLYSIS Right 12/30/2021    Procedure: LAPAROSCOPY, SURGICAL, ENTEROLYSIS (FREEING OF INTESTINAL ADHESION) (SEPARATE PROCEDURE);  Surgeon: Carlin Curtistine Route, MD;  Location: MAIN OR [facility];  Service: Trauma    RADIATION Left    [4]   Family History  Problem Relation Age of Onset    Cancer Mother     Heart disease Mother     Colon cancer Father     Cancer Father  No Known Problems Sister     No Known Problems Brother     BRCA 1/2 Daughter     BRCA 1/2 Daughter     BRCA 1/2 Daughter     No Known Problems Son     No Known Problems Maternal Grandmother     No Known Problems Maternal Grandfather     No Known Problems Paternal Grandmother     No Known Problems Paternal Grandfather     No Known Problems Other     Breast cancer Neg Hx     Endometrial cancer Neg Hx     Ovarian cancer Neg Hx

## 2024-03-01 NOTE — Consults (Signed)
 Adult Nutrition Assessment Note    Visit Type: MD Consult, RN Consult  Reason for Visit: Assessment (Nutrition), Per Admission Nutrition Screen (Adult)    NUTRITION INTERVENTIONS and RECOMMENDATION     Encourage and monitor PO intake and record % consumed in Epic.  Ensure Plus High Plus BID (chocolate). Adjust ONS based on patient's PO intake, tolerance, labs.  Continue thiamine .   Continue appetite stimulant.  Consider daily multivitamin.   Weekly weights.      NUTRITION ASSESSMENT     Current nutrition therapy is appropriate and PO intake likely meeting nutritional needs at this time based on nurse report and documented PO intakes.   Patient would benefit from oral supplement to optimize weight given underweight/low BMI.     NUTRITIONALLY RELEVANT DATA     HPI & PMH:   Per provider note: Christy Buchanan is a 69 y.o. female with a history of self-reported bipolar II, anxiety, MDD, and multiple complex medical illnesses, being admitted to [facility] inpatient service. They have been referred for admission to [facility] inpatient psychiatric unit for worsening depression and poor self-care.      Nutrition History:   Patient unavailable due to working with providers. Per chart, patient reported having a decreased appetite since moving from [place]  and not liking the food here. Patient endorsed trying to consume yogurt, fruits, and chocolate Ensure-like drink for breakfast; lunch that usually consisted of more yogurt and some soup; patient did not share what eats for dinner, just stated she does not eat meat. Per daughter, patient with 2 months of worsened panic and anxiety; since early December, patient not eating. Patient with 20-lb weight loss over the past year; patient has been trying to prevent weight loss and has been forcing herself to eat. RN endorses patient currently eating well. Patient consumed 100% of breakfast today. Documented PO intakes of 75-100% of meals (one 50%) plus snack. Medications:  Nutritionally pertinent medications reviewed and evaluated for potential food and/or medication interactions. Senokot, protonix , lurasidone , thiamine , remeron      Labs:   Nutritionally pertinent labs reviewed.     Nutritional Needs:   Healthy balance of carbohydrate, protein, and fat.   Increased needs to promote weight gain given low BMI     Anthropometric Data:  Height: 165.1 cm (5' 5)   Admission weight: 45.5 kg (100 lb 4.8 oz)  Last recorded weight: 45.5 kg (100 lb 4.8 oz) (02/29/24)  IBW: 56.75 kg  BMI: Body mass index is 16.69 kg/m??.   Usual Body Weight: Unable to obtain at this time   Weight Assessment: -6.2 kg (12%) between 10/28/23-02/29/24 (4 months, 2 days) - significant   Wt Readings from Last 10 Encounters:   02/29/24 45.5 kg (100 lb 4.8 oz)   02/24/24 45.4 kg (100 lb)   01/31/24 46.1 kg (101 lb 9.6 oz)   01/03/24 46.9 kg (103 lb 6.4 oz)   11/23/23 49.4 kg (108 lb 14.5 oz)   10/28/23 51.7 kg (114 lb)   08/24/23 51.7 kg (113 lb 15.7 oz)   05/25/23 53.4 kg (117 lb 11.6 oz)   04/27/23 52 kg (114 lb 11.2 oz)   03/02/23 54.8 kg (120 lb 13 oz)        Weight changes this admission:   Last 5 Recorded Weights    02/29/24 1009   Weight: 45.5 kg (100 lb 4.8 oz)        Malnutrition Assessment:  Malnutrition Assessment using AND/ASPEN or GLIM Clinical Characteristics:  GLIM Severe Malnutrition (03/01/24 1549)  Involuntary Weight Loss: >10% in 6 months  Low Body Mass Index: <18.5 if < 70 years  Reduced Food Intake: Less than or equal 50% of requirement > 1 week    Nutrition Focused Physical Exam:  Unable to complete at this time due to patient with providers    Care plan:  Completed    Current Nutrition:  Oral intake   Nutrition Orders            Nutrition Therapy Regular/House; Safety Tray starting at 01/17 1720          Nutritionally Pertinent Allergies, Intolerances, Sensitivities, and/or Cultural/Religious Restrictions:  none identified per chart review at this time     GOALS and EVALUATION     Patient to meet 75% or greater of nutritional needs via combination of meals, snacks, and/or oral supplements within admission.  - New    Motivation, Barriers, and Compliance:  Evaluation of motivation, barriers, and compliance pending at this time due to patient availability    Discharge Planning:   Monitor for potential discharge needs with multi-disciplinary team.     Follow-Up Parameters:   1-2 times per week (and more frequent as indicated)    Dorthea CINDERELLA Purpura, MPH, RD, LDN  Pager # [phone]

## 2024-03-01 NOTE — Plan of Care (Incomplete)
 1:1 spent with patient. Patient A/O x 4. Denied HI, and AVH, though continues. Patient is medication compliant and cooperative with staff. Patient is independent with ambulation and ADL's. No falls this shift. Meal intake adequate/inadequate. Social with staff and peers/Isolated. Attended offered groups. Affect ___. No unsafe behaviors at this time. Monitoring ongoing.     Shift Summary  QUEtiapine  was administered PRN to support mood and behavioral management during the shift.  Menthol  was administered PRN for cough in the afternoon.  Safety interventions, including elopement precautions and environmental modifications, were maintained throughout the shift.  Patient participated in a 15-minute treatment session and engaged in supportive measures such as active listening and goal-setting.  Patient remained independent in mobility and hygiene, with no falls or injuries reported; overall, the environment was documented as safe and no unsafe behaviors were observed.    Adheres to Safety Considerations for Self and Others: No self-injurious or destructive behaviors were observed, and boundaries and impulse control were supported through interventions; patient acknowledged mental health diagnosis and demonstrated understanding of treatment options, though unable to identify warning signs of crisis or decompensation. Safety interventions such as elopement precautions, environmental modifications, and structure were maintained throughout the shift, with no unsafe behaviors noted and a safe environment documented.    Absence of Fall and Fall-Related Injury: Patient remained independent in mobility and hygiene, walked frequently with no limitations, and demonstrated balanced, coordinated gait; no falls or fall-related injuries occurred during the shift, and safe patient handling equipment was not required.

## 2024-03-01 NOTE — Consults (Signed)
 Recreational Therapy Evaluation  03/01/2024    Patient Name:  Christy Buchanan       Medical Record Number: [id number]   Date of Birth: 04/19/1955  Sex: Female          Room/Bed:  4111/4111-01    Session Duration  Individual [mins]: 15 Min.    Assessment  Patient is a 69 year old female with a history of self-reported bipolar II, anxiety, MDD, and multiple complex medical illnesses. The patient's presentation, including symptoms of low mood, difficulty sleeping, anhedonia, weight loss (15 lbs. since October), difficulty concentrating, anxiety, and recent suicide attempt (via cutting wrists). Medically, patient was found to have a shallow lesion with rolled edges on her left shoulder, she was not aware. She also reports a dry cough for the last 2-3 weeks with a history of smoking. Additionally, patient has NPH and VP head shunt placed in 2022. Patient was educated on benefits of coping skill/leisure involvement to overall wellbeing. Patient was willing to speak with LRT depressed mood and did not seem like she wanted to disclose much about her situation. Patient was receptive and verbalized understanding. Patient would benefit from Recreational Therapy services to increase education and practice of effective coping strategies/leisure activities to help manage emotional distress.    Plan of Care  Plan of Care Initiated: 03/01/24  1x per day Weekly Frequency: 3-4x week   Planned Treatment Duration: 03/08/24    Motivators: Able to Identify motivators  Patient's Motivators: feeling very negative  Patient's Identified Treatment Goal: try to stay in the now  Patient's Stressors / Triggers: Patient's financial situation is not great and was taken advantage of a debt company that told her not to pay her credit card company. Received two court summonses to her door, cognitive decline over the last year that abruptly and significantly worsened in December, trying to stop working.  Treatment Plan developed in collaboration with: Patient, Treatment Team  Interventions: Communication and social skills, Coping skills, Expressive arts, Emotional self regulation, Functional cognitive skills, Leisure, Promoting social interaction, Psychosocial counseling, Refuting negative thoughts, Relaxation training, Stress management, Wellness / recovery     Goals:  1. By 03/08/24, patient will demonstrate 4 positive coping skills with no cues required, for use in managing illness, demonstrating improved ability to maintain safety,       2. By 03/15/24, patient will verbalize plan to implement 3 healthy coping skills learned during treatment for use as needed to maintain safety post discharge.,        ,         ,         Interventions: Communication and social skills, Coping skills, Expressive arts, Emotional self regulation, Functional cognitive skills, Leisure, Promoting social interaction, Psychosocial counseling, Refuting negative thoughts, Relaxation training, Stress management, Wellness / recovery       Subjective    Current Situation: worsening depression and poor self-care.  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 but feels she is unable to continue and trying to transfer her case load to others.)  Living Environment: House  Lives With: Alone  Risk Factors: None  Precautions: Systems Analyst / Environment: Patient not wearing mask for full session, None  Add'l Session Information: No family/caregiver present, RN aware of RT tx session    Past Medical History[1] Social History  Tobacco Use    Smoking status: Former     Types: Cigarettes     Passive exposure: Never    Smokeless tobacco: Never   Substance Use Topics    Alcohol use: Not Currently     Comment: rarely      Past Surgical History[2] Family History[3]     Allergies: Escitalopram      Objective    Cognitive  Stage of change / level of insight: Pre-contemplation  Thought Process/Content: Intact (Patient expressed feeling like she is having hard time staying focused, getting lost in familiar places, getting, getting confused, brain fog which causes her frustration. Per chart daughters say patient's memory seems to be declining.)  Judgment: Can verbalize healthy decision making but behavior inconsistent  Memory: Independent with recall  Follows Directions: Able to follow directions independently  Attention Span/Alertness : Able to attend to RT assessment  Orientation: Fully oriented    Communication  Communication Barriers: None noted    Emotional  Mood: Anxious  Affect: Congruent  Pain Management : Reports no pain  Frustration Tolerance: Reports frustration that is situational (frustraited about change in cognition and abilities.)  Coping Skills : Reports knowledge of coping skills but no independent practice  Motivated to learn new coping strategies?: Limited  Emotional Expression: Independently expresses feelings    Physical Domain  Vision: Wears glasses all the time  Additional Physical Domain comments: Patient ambulates independently    Social  Support system: Reports limited support (Patient's 3 daughters and 8 grandchildren are a support they live in [place]  and 2 out of state)  Assertiveness: Independent with assertiveness  Patient Behaviors and Interactions: Appropriate  Ability to form relationship / interact with others: Visible on milieu  Additional Social Domain Comments: Patient said she wants to be with people but has a hard time paying attention so she has become more isalative.     Leisure and Life Function  Level of involvement: No current participation (Patient said she has not currently had the focus to participate in her leisure interests for about a year. She has enjoyed cooking, sewing, art, 2 cats, and spending time with family.)  Motivation to engage in leisure / play: Yes  Quality of participation: Involved in healthy leisure though dissatisfied with barriers      I attest that I have reviewed the above information.  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    Placed 11.2023  [place]  150 is setting 11.2023    Vitamin D deficiency    [2]   Past Surgical History:  Procedure Laterality Date    AUGMENTATION MAMMAPLASTY Bilateral     24ish yrs    BREAST BIOPSY Left     malignant    CHEMOTHERAPY      MASTECTOMY Left 25 ish yrs ago    PR CREATE SHUNT:VENTRIC-PERITONEAL Right 12/30/2021    Procedure: CREAT SHUNT; VENTRICULO-PERITONEAL/PLEURAL;  Surgeon: Kari Donnice Gaster, MD;  Location: MAIN OR ;  Service: Neurosurgery    PR LAP, SURG ENTEROLYSIS Right 12/30/2021    Procedure: LAPAROSCOPY, SURGICAL, ENTEROLYSIS (FREEING OF INTESTINAL ADHESION) (SEPARATE PROCEDURE);  Surgeon: Carlin Curtistine Route, MD;  Location: MAIN OR [facility];  Service: Trauma    RADIATION Left    [3]   Family History  Problem Relation Age of Onset    Cancer Mother     Heart disease Mother     Colon cancer Father     Cancer Father     No Known Problems Sister     No Known Problems Brother     BRCA 1/2 Daughter     BRCA 1/2 Daughter     BRCA 1/2 Daughter     No Known Problems Son     No Known Problems Maternal Grandmother     No Known Problems Maternal Grandfather     No Known Problems Paternal Grandmother     No Known Problems Paternal Grandfather     No Known Problems Other     Breast cancer Neg Hx     Endometrial cancer Neg Hx     Ovarian cancer Neg Hx

## 2024-03-01 NOTE — Plan of Care (Signed)
 1:1 spent with patient. Patient A/O x 4. Denies HI, AVH, though pt continues to endorse passive SI. Patient is medication compliant and cooperative with staff. Patient is independent with ambulation and ADL's. No falls this shift. Meal intake adequate. Social with staff and peers/Isolated. Attended offered groups. Affect sad and depressed. No unsafe behaviors at this time. Monitoring ongoing.     Shift Summary  QUEtiapine  was administered PRN to support mood and behavioral management during the shift.  Menthol  was administered PRN for cough in the afternoon.  Safety interventions, including elopement precautions and environmental modifications, were maintained throughout the shift.  Patient participated in a 15-minute treatment session and engaged in supportive measures such as active listening and goal-setting.  Patient remained independent in mobility and hygiene, with no falls or injuries reported; overall, the environment was documented as safe and no unsafe behaviors were observed.    Adheres to Safety Considerations for Self and Others: No self-injurious or destructive behaviors were observed, and boundaries and impulse control were supported through interventions; patient acknowledged mental health diagnosis and demonstrated understanding of treatment options, though unable to identify warning signs of crisis or decompensation. Safety interventions such as elopement precautions, environmental modifications, and structure were maintained throughout the shift, with no unsafe behaviors noted and a safe environment documented.    Absence of Fall and Fall-Related Injury: Patient remained independent in mobility and hygiene, walked frequently with no limitations, and demonstrated balanced, coordinated gait; no falls or fall-related injuries occurred during the shift, and safe patient handling equipment was not required.

## 2024-03-01 NOTE — Plan of Care (Signed)
 Shift Summary  Safety interventions were maintained and no self-harm or unsafe behaviors were observed during the shift.    No new illness or injury was documented, and pain remained at zero.    Mood and affect were monitored, with support and reassurance provided for depressive and irritable symptoms.    No falls occurred, and mobility remained steady and independent.    Overall, the patient remained safe and stable with no significant changes in condition.     Adheres to Safety Considerations for Self and Others: No new cuts or wounds were noted, and no self-injurious or destructive behaviors were observed; impulse control and a calm environment were promoted throughout the shift. Safety interventions such as low bed and nonskid footwear were maintained, and the environment remained safe.     Absence of New-Onset Illness or Injury: No new illness or injury was documented, and pain remained at zero during the shift.     Optimized Coping Skills in Response to Life Stressors: Mood was noted as depressed and irritable, and affect was sad, but the patient was cooperative and calm with support and reassurance provided.     Absence of Fall and Fall-Related Injury: No falls occurred, gait and movement were steady and balanced, and fall risk remained low with no history of falls or need for safe patient handling equipment.

## 2024-03-01 NOTE — Progress Notes (Signed)
 Psychiatry      Daily Progress Note      Admit date/time: 02/26/2024  5:00 PM   LOS: 1 day      Assessment:    Patient is a 69 y.o. 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    Diagnostic formulation:   The patient's presentation, including symptoms of low mood, difficulty sleeping, anhedonia, weight loss, difficulty concentrating, anxiety, and recent suicide attempt (via cutting wrists), appears to be most consistent with a diagnosis of major depressive disorder. Could consider bipolar disorder given reported history; however, lack of any clear hypomanic or manic episodes in her history make this less likely. Should also consider the contribution of trauma symptoms given diagnosis of PTSD, history of trauma, and reports of nightmares and hypervigilance. Must also consider contribution of psychosocial stressors, as per daughter patient's anxiety significantly worsened after receiving court summons for financial stressors in October.     Additionally, patient and daughters report sub-acute cognitive decline (confusion, inability to complete IDLs, getting lost in familiar situations) over the last year that abruptly and significantly worsened in December. She was receiving work-up for potential shunt dysfunction with her outpatient neurosurgeon, who she sees for a history of NPH, but did not complete imaging due to anxiety. Should also consider potential underlying neurocognitive disorder in addition to shunt dysfunction given MRI from 2023 with chronic small vessel ischemic changes. Will consider repeat imaging and neurocognitive testing this admission.    As of 03/01/2024, continued hospitalization is warranted given continued debilitating anxiety and depression, inability to care for self independently, complex medical conditions requiring treatment, memory difficulties, medication titration, and need for safe discharge planning. Today, patient reports continued anxiety and difficulty regulating emotions. Per daughters, they do not feel that they are able to care for patient given her notable decline, and are touring potential placement facilities in [place] , where one of her daughters lives.    Medically, patient was seen by neurosurgery yesterday, who found evolution of chronic bilateral subdural hematomas and recommended adjusting shunt. However, when they discussed this with patient last night she became dysregulated and refused. Discussed this recommendation with patient this morning, and she expressed understanding and recognition that her anxiety made decision making difficult. Both patient and daughter provided consent to shunt adjustment; will reach back out to neurosurgery.     Diagnoses:   Active Problems:    Severe episode of recurrent major depressive disorder, without psychotic features    (CMS-HCC)    Anxiety      Plan:  Safety:  -- Continue admission to inpatient psychiatric unit for safety, stabilization, and treatment.     -- Civil Commitment Status:Involuntary    -- On unit/off unit level of supervision: q15 min checks / 1:1 (staff:patient)  -- Medical equipment: The treatment team has determined that this patient requires the continued use of medical equipment  medical bed for their care and healing. Specific medical rationale for need for equipment: advanced age (89 or above),        Psychiatry:  # MDD  -- Latuda  20 mg daily  -- Mirtazapine  30 mg nightly  -- Trazodone  150 mg nightly     # Self-Inflicted Wrist Wound   -- Consult wound nurse     # Therapy Interventions:  -- RT, OT, Milieu therapy    Medical:    #Nutritional  Rehabilitation  -- Start 100mg  Vitamin B1  -- BMP, Mg, Phos q24 labs to monitor for refeeding syndrome  -- Consult dietician     # HLD  -- continue home 80mg  atorvastatin      #GERD  -- continue home 20mg  pantoprazole       #Constipation  -- start nightly senna per patient report     #NPH, sp shunt (2023)  --Consult neurosurgery, recs appreciated   -- NSGY to adjust shunt today     #Essential Thrombocythemia   --Consult hematology, recs appreciated   -- Defer ET medications at this time given presence of subdural hematomas    -- Metabolic Monitoring:   Initial Weight: Weight: 45.5 kg (100 lb 4.8 oz)  Last Weight: Weight: 45.5 kg (100 lb 4.8 oz)  Last BMI: Body mass index is 16.69 kg/m??.  Admit BP: BP: 115/92  Last BP: BP: 132/68  Lipid Panel:   Lab Results   Component Value Date    Cholesterol, Total 152 12/08/2022    LDL Direct 53.0 08/24/2023    Cholesterol, LDL, Calculated 111 (H) 03/11/2022    Cholesterol, HDL 58 03/11/2022    Triglycerides 104 03/11/2022     Hemoglobin A1C:   Lab Results   Component Value Date    Hemoglobin A1C 5.3 05/29/2022      Fasting Blood Sugar:   Lab Results   Component Value Date    Glucose 107 (H) 12/16/2023         Social/Disposition:  -- TBD, daughters working with SW to identify alternative living situations    Patient was seen and plan of care was discussed with the Attending MD, Dr. Vicci , who agrees with the above statement and plan.     Noretta [name] Cong, MD    Subjective:    Hours of sleep overnight :   6.27       Per 02/29/24 RN Epic note:   Safety interventions were maintained and no self-harm or unsafe behaviors were observed during the shift.    No new illness or injury was documented, and pain remained at zero.    Mood and affect were monitored, with support and reassurance provided for depressive and irritable symptoms.    No falls occurred, and mobility remained steady and independent.    Overall, the patient remained safe and stable with no significant changes in condition.     MD interview:   Feeling very tired today. Yesterday was overwhelming, I couldn't catch my breath between all the groups. Slept all right, appetite okay yesterday. Felt very busy, contrasted with how reclusive she was at home. Was surprised by neurosurgery visit yesterday - didn't feel like she handled it well. Discuss reason neurosurgery wanted to adjust shunt. Patient wants to talk to daughter before proceeding with neurosurgery's recommendations. Also discussed chest CT for chronic cough.    Collateral  Spoke to patient's daughter Duwaine Brandy for 10 minutes. Discussed the recommendation from neurosurgery for shunt adjustment; daughter is in agreement with adjustment. Recently, her mother has been doing worse in the evenings and is more easily agitated, and thinks that yesterday she got confused and upset when NRSY came late in the day. Also provided updates on other medical consults and recommendations; daughter expressed understanding and thanks for thorough work-up. Hopes team will be able to obtain a MOCA tomorrow to know if they should be thinking about memory care placement. Daughter currently in [place] to tour potential placement facilities for eventual dispo.  Objective:    Vitals:  Vitals:    03/01/24 0813   BP: 132/68   Pulse: 101   Resp: 16   Temp: 36.9 ??C (98.4 ??F)   SpO2: 100%       Mental Status Exam:    Appearance:    Appears stated age and Malnourished   Motor:   No abnormal movements   Speech/Language:    Normal rate, volume, tone, fluency and Language intact, well formed   Mood:   Anxious   Affect:   Anxious and Cooperative   Thought process:   Logical, linear, clear, coherent, goal directed   Thought content:     Denies SI, HI, self harm, delusions, obsessions, paranoid ideation, or ideas of reference   Perceptual disturbances:     Denies auditory and visual hallucinations, behavior not concerning for response to internal stimuli     Insight:     Impaired   Judgment:    Impaired   Impulse Control:   Impaired   Other:       PE:   Gen: No acute distress, well developed and well nourished  Resp: Normal work of breathing  Neuro: no tics/tremors    Test Results:  Data Review: Lab results last 24 hours:    Recent Results (from the past 24 hours)   Basic Metabolic Panel    Collection Time: 03/01/24  7:29 AM   Result Value Ref Range    Sodium 145 135 - 145 mmol/L    Potassium 4.4 3.5 - 5.1 mmol/L    Chloride 106 98 - 107 mmol/L    CO2 27.0 20.0 - 31.0 mmol/L    Anion Gap 12 5 - 14 mmol/L    BUN 23 9 - 23 mg/dL    Creatinine 9.31 9.44 - 1.02 mg/dL    BUN/Creatinine Ratio 34     eGFR CKD-EPI (2021) Female >90 >=60 mL/min/1.72m2    Glucose 95 70 - 179 mg/dL    Calcium 8.9 8.7 - 89.5 mg/dL   Magnesium Level    Collection Time: 03/01/24  7:29 AM   Result Value Ref Range    Magnesium 2.0 1.6 - 2.6 mg/dL   Phosphorus Level    Collection Time: 03/01/24  7:29 AM   Result Value Ref Range    Phosphorus 3.7 2.4 - 5.1 mg/dL   Vitamin B12 Level    Collection Time: 03/01/24  7:29 AM   Result Value Ref Range    Vitamin B-12 596 211 - 911 pg/ml   Folate Level    Collection Time: 03/01/24  7:29 AM   Result Value Ref Range    Folate 14.1 >=5.4 ng/mL     Imaging: Radiology report(s) reviewed.    Psychometrics:    Social Drivers of Health with Concerns     Tobacco Use: Medium Risk (02/24/2024)    Patient History     Smoking Tobacco Use: Former     Smokeless Tobacco Use: Never     Passive Exposure: Never   Physical Activity: Not on file   Stress: Not on file   Substance Use: Not on file (12/14/2022)   Social Connections: Socially Isolated (02/09/2024)    Received from [facility]    Social Connection and Isolation Panel     Frequency of Communication with Friends and Family: Three times a week     Frequency of Social Gatherings with Friends and Family: Three times a week     How often do you attend church or  religious services?: Never     Do you belong to any clubs or organizations such as church groups, unions, fraternal or athletic groups, or school groups?: No     How often do you attend meetings of the clubs or organizations you belong to?: Never     Marital Status: Divorced   Physicist, Medical Strain: Patient Declined (10/15/2023)    Overall Financial Resource Strain (CARDIA)     Difficulty of Paying Living Expenses: Patient declined   Recent Concern: Financial Resource Strain - Medium Risk (09/23/2023)    Received from [facility]    Overall Financial Resource Strain (CARDIA)     Difficulty of Paying Living Expenses: Somewhat hard   Health Literacy: Not on file   Internet Connectivity: Internet connectivity concern identified (10/15/2023)    Internet Connectivity     Do you have access to internet services: No     How do you connect to the internet: Not on file     Is your internet connection strong enough for you to watch video on your device without major problems?: Not on file     Do you have enough data to get through the month?: Not on file     Does at least one of the devices have a camera that you can use for video chat?: Not on file        ---  Time-based billing disclaimer:  I personally spent 60   minutes face-to-face and non-face-to-face in the care of this patient, which includes all pre, intra, and post visit time on the date of service.  All documented time was specific to the E/M visit and does not include any procedures that may have been performed.

## 2024-03-01 NOTE — Procedures (Signed)
 Interrogation  and Reprogramming of CSF Shunt    Performing Service: Neurosurgery  Patient Location: [phone]     Interrogation  and Reprogramming of CSF Shunt    Indications: Concern for overdrainage     Procedure:  Interrogation  and Reprogramming of CSF Shunt    Shunt Type: Sophysa  Documented Previous Setting: 70  Actual Setting on Interrogation: 32  New Setting: 110    Using the reprograming device, the shunt was set to 110.    Complications: None    Specimens: None

## 2024-03-01 NOTE — Plan of Care (Signed)
 1:1 spent with patient. Patient A/O x 4 Denied SI, HI, and AVH. Patient is medication compliant and cooperative with staff. Patient is independent with ambulation and ADL's; pt showered late in the evening. No falls this shift. Meal intake adequate. Social with staff and peers. Affect flat to blunted. No unsafe behaviors at this time. Monitoring ongoing.     Shift Summary  QUEtiapine  was administered PRN to support mood and behavioral management during the shift.  Menthol  was administered PRN for cough in the afternoon.  Safety interventions, including elopement precautions and environmental modifications, were maintained throughout the shift.  Patient participated in a 15-minute treatment session and engaged in supportive measures such as active listening and goal-setting.  Patient remained independent in mobility and hygiene, with no falls or injuries reported; overall, the environment was documented as safe and no unsafe behaviors were observed.    Adheres to Safety Considerations for Self and Others: No self-injurious or destructive behaviors were observed, and boundaries and impulse control were supported through interventions; patient acknowledged mental health diagnosis and demonstrated understanding of treatment options, though unable to identify warning signs of crisis or decompensation. Safety interventions such as elopement precautions, environmental modifications, and structure were maintained throughout the shift, with no unsafe behaviors noted and a safe environment documented.    Absence of Fall and Fall-Related Injury: Patient remained independent in mobility and hygiene, walked frequently with no limitations, and demonstrated balanced, coordinated gait; no falls or fall-related injuries occurred during the shift, and safe patient handling equipment was not required.

## 2024-03-01 NOTE — Progress Notes (Signed)
 [facility] NEUROSURGERY CONSULT PROGRESS NOTE    For questions/concerns regarding patients:  Mon-Fri 6 AM-6 PM, please page Cranial Neurosurgery Consult Service at [phone]  Sun-Thurs 6 PM-6 AM AND weekends/holidays, please page Neurosurgery Night Float/ On-Call Service        Requesting Service Attending Physician:  Larraine [name] Louder  Neurosurgery Attending of Record: Rosaria Lush, MD     Brief History of Present Illness  Christy Buchanan is a 69 y.o. female with NPH with a VP shunt (12/2021) and chronic subdural hematomas (1/17) who is currently admitted for depression and SI whom we were re-engaged for starting Aspirin in the setting of her chronic subdural hematomas.     Subjective/Interval History  Platelets 636 on 1/17.    Interval imaging personally reviewed  CT head (1/17) shows slightly larger chronic appearing bilateral subdural hematomas compared to CT on 12/31 with decreased size of ventricles.     Assessment and Recommendations  **Chronic bilateral subdural hematomas  - No neurosurgical contraindication to starting aspirin at this time given the abscence of acute blood however there is a theoretic risk albeit low to bleeding into chronic subdurals. It does not appear that there is a strong indication to starting aspirin at this time but would defer risk benefit evaluation and decision to primary team.  ___________________________________________________________________    Neurological Exam  This is a treatment plan note only.    Problem List  Active Problems:    Severe episode of recurrent major depressive disorder, without psychotic features    (CMS-HCC)    Anxiety

## 2024-03-02 LAB — BASIC METABOLIC PANEL
ANION GAP: 11 mmol/L (ref 5–14)
BLOOD UREA NITROGEN: 21 mg/dL (ref 9–23)
BUN / CREAT RATIO: 29
CALCIUM: 9.1 mg/dL (ref 8.7–10.4)
CHLORIDE: 106 mmol/L (ref 98–107)
CO2: 27 mmol/L (ref 20.0–31.0)
CREATININE: 0.72 mg/dL (ref 0.55–1.02)
EGFR CKD-EPI (2021) FEMALE: >90 mL/min/1.73m2 (ref >=60)
GLUCOSE RANDOM: 104 mg/dL (ref 70–179)
POTASSIUM: 3.9 mmol/L (ref 3.4–4.8)
SODIUM: 144 mmol/L (ref 135–145)

## 2024-03-02 LAB — MAGNESIUM: MAGNESIUM: 2 mg/dL (ref 1.6–2.6)

## 2024-03-02 LAB — PHOSPHORUS: PHOSPHORUS: 3.2 mg/dL (ref 2.4–5.1)

## 2024-03-02 LAB — VITAMIN D 25 HYDROXY: VITAMIN D, TOTAL (25OH): 48.2 ng/mL (ref 20.0–80.0)

## 2024-03-02 MED ADMIN — traZODone (DESYREL) tablet 150 mg: 150 mg | ORAL | @ 02:00:00

## 2024-03-02 MED ADMIN — atorvastatin (LIPITOR) tablet 80 mg: 80 mg | ORAL | @ 14:00:00

## 2024-03-02 MED ADMIN — thiamine mononitrate (vit B1) tablet 100 mg: 100 mg | ORAL | @ 14:00:00

## 2024-03-02 MED ADMIN — lurasidone (LATUDA) tablet 20 mg: 20 mg | ORAL | @ 14:00:00

## 2024-03-02 MED ADMIN — pantoprazole (Protonix) EC tablet 20 mg: 20 mg | ORAL | @ 12:00:00

## 2024-03-02 MED ADMIN — mirtazapine (REMERON) tablet 30 mg: 30 mg | ORAL | @ 02:00:00

## 2024-03-02 MED ADMIN — meclizine (ANTIVERT) tablet 12.5 mg: 12.5 mg | ORAL | @ 21:00:00 | Stop: 2024-03-02

## 2024-03-02 MED ADMIN — senna (SENOKOT) tablet 1 tablet: 1 | ORAL | @ 02:00:00

## 2024-03-02 NOTE — Plan of Care (Signed)
 Shift Summary  Patient experienced periods of sadness and worry but maintained calm mood and appropriate affect, with support for coping and impulse control throughout the shift.  Nutritional intake and oral care were adequate and independently managed, with comfort measures in place.  Meclizine  was administered in the afternoon.  Vitamin D level was within optimum range based on lab results.  Overall, patient demonstrated ability to express feelings and thoughts, remained cooperative, and maintained adequate nutrition and self-care.    Optimized Coping Skills in Response to Life Stressors: Mood remained calm and affect was appropriate throughout the shift, though periods of crying and sadness were noted; patient expressed worry about sleep and demonstrated ability to express feelings and thoughts, with boundaries and impulse control supported in a calm environment.    Improved Nutritional Intake: Nutrition was adequate and patient was able to feed self; oral comfort was promoted with nonirritating foods and fluids, and oral care was performed independently.

## 2024-03-02 NOTE — Plan of Care (Signed)
 Nurse to nurse report completed @ approximately 0730,Patient has has alert and oriented x 4. Denies HI, AVH, though pt continues to endorse passive SI but contracted for safety. She has been medication compliant and cooperative with staff. Patient is independent with ambulation and ADL's. Meal intake no adequate.Interacting  with peers some, but she has been Isolating  herself the bedroom most of the day,affect has been flat, patient refused to discuss anything about her recent diagnosis, Stated  I want to to quite for now she stayed in her room for most of the afternoon,Patient reported of vertigo and MD was notified,MD saw patient and Meclizine  ordered and was administered, continue to monitor.No unsafe behaviors at this time. Monitoring ongoing.

## 2024-03-02 NOTE — Plan of Care (Signed)
 [facility]  Psychiatry  Court Statement       Commitment Type: Involuntary Adult    Re: Christy Buchanan    Date: March 02, 2024    Description of symptoms and behaviors supporting finding of mental illness/substance abuse (include pre-admission history and observation since admission): The patient's presentation, including symptoms of low mood, difficulty sleeping, anhedonia, weight loss, difficulty concentrating, anxiety, and recent suicide attempt (via cutting wrists), appears to be most consistent with a diagnosis of major depressive disorder. Could consider bipolar disorder given reported history; however, lack of any clear hypomanic or manic episodes in her history make this less likely. Should also consider the contribution of trauma symptoms given diagnosis of PTSD, history of trauma, and reports of nightmares and hypervigilance. Must also consider contribution of psychosocial stressors, as per daughter patient's anxiety significantly worsened after receiving court summons for financial stressors in October.     Additionally, patient and daughters report sub-acute cognitive decline (confusion, inability to complete IDLs, getting lost in familiar situations) over the last year that abruptly and significantly worsened in December. She was receiving work-up for potential shunt dysfunction with her outpatient neurosurgeon, who she sees for a history of NPH, but did not complete imaging due to anxiety. Should also consider potential underlying neurocognitive disorder in addition to shunt dysfunction given MRI from 2023 with chronic small vessel ischemic changes.      Diagnoses: Major Depressive Disorder    Need for further inpatient treatment (include description of specific behaviors and symptoms supporting finding that further inpatient treatment is needed): Continued hospitalization is warranted at this time given continued debilitating anxiety and depression, inability to care for self independently, complex medical conditions requiring treatment, memory difficulties, medication titration, and need for safe discharge planning.      Recommendation: 14 days Inpatient    Christy Buchanan Cong, MD

## 2024-03-02 NOTE — Treatment Plan (Addendum)
 Brief Oncology Treatment Plan:    Christy Buchanan is a 69 y.o. woman with MDD, bipolar disorder, cognitve impairment, NPH with shunt, ET previously on IFN (stopped 2024 2/2 mood disturbances), chronic SDH, and prior history of breast cancer (s/p chemo and lumpectomy 1995, in remission). She is currently admitted to psychiatry involuntarily.     While admitted, she was noted to have significant weight loss and dyspnea. Given her 40+ pack year smoking history, a CT chest w/o contrast was obtained, revealing a large 4.6 cm mass in the peribronchovascular superior segment of the right lower lobe with transfissural extension to the posterior right upper lobe. 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     Recommendations:  [ ]  Recommend interventional pulmonology consult for potential biopsy of 4.6 cm peribronchovascular mass  [ ]  Recommend CT AP w/ contrast and MRI brain w/wo contrast for staging    We will continue to follow.    [name], MD  Hematology/Oncology Fellow  [facility]

## 2024-03-02 NOTE — Consults (Signed)
 This patient has been evaluated for and accepted to occupational therapy services; please see consult note 03/01/2024. Thank you for your referral.

## 2024-03-02 NOTE — Plan of Care (Signed)
 Shift Summary  Pt was visited by her daughter, visit went well. Pt went to bed after visit. She is quiet,sad  appears anxious. Pt with minimal conversation with this clinical research associate. She denies SI thoughts. She is med compliant declined PRN med. No unsafe behavior observed. Pt slept well.   Safety interventions such as environmental modification, low bed, and locked unit were implemented and maintained throughout the shift.   No self-injurious or destructive behaviors were observed, and no new wounds or pain were reported.   Mood remained calm, affect appropriate, and thought processes coherent, with support provided during interactions.   Hourly rounds and visual checks were consistently performed, and no risk for elopement was noted.   Overall, safety and coping goals were supported and maintained during the shift.     Adheres to Safety Considerations for Self and Others: No self-injurious or destructive behaviors were observed, and safety interventions such as environmental modification, low bed, and locked unit were maintained throughout the shift. Hourly rounds and visual checks were performed, and no risk for elopement was noted.     Absence of New-Onset Illness or Injury: No new cuts, wounds, or pain were reported, and general appearance remained stable.     Optimized Coping Skills in Response to Life Stressors: Mood was calm, affect appropriate, and thought processes coherent; boundaries were reinforced and support provided during interactions with staff and visitors.

## 2024-03-02 NOTE — Consults (Signed)
 Interventional Pulmonary Initial Consult Note     Date of Service: 03/02/2024  Requesting Physician: Larraine Marcos Louder, MD   Requesting Service: Psychiatry (PSY)  Reason for consultation: Comprehensive evaluation of Lung Mass.      Recommendations     #4.6 Peribronchial RLL Mass  # Right hilar, mediastinal, right anterior diaphragmatic and right internal mammary lymphadenopathy   -  We will plan for bronchoscopy with EBUS later next week .  -  Please obtain MRI brain for staging.         This patient was seen and evaluated with Dr.Smith. The recommendations outlined in this note were discussed w the primary team via phone. Please page the Interventional Pulmonology pager at [phone] with any questions and notify the IP service with discharge plans to ensure the patient has appropriate IP follow up. We look forward to following with you.     Traver Meckes Claudio Stallion, MD    Assessment     Interval Events: None       Impression: The patient is a 69 y/o woman with a prior history of breast cancer (status post chemotherapy and lumpectomy in 1995, currently in remission) who was found to have a large 4.6-cm mass in the peribronchovascular superior segment of the right lower lobe with transfissural extension into the posterior right upper lobe. She also has right hilar, mediastinal, right anterior diaphragmatic, and right internal mammary lymphadenopathy. Given these findings in the setting of a significant smoking history, there is high concern for malignancy. EBUS is planned to be scheduled for next week.    Problems addressed during this consult include lung mass.     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    Over the past 2-3 months, the patient has experienced significant unintentional weight loss and decreased appetite. A CT chest obtained during this admission revealed a large 4.6-cm mass in the peribronchovascular superior segment of the right lower lobe with transfissural extension into the posterior right upper lobe. 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    The patient endorses night sweats and a chronic cough.  Subjective:       Vitals - past 24 hours  Temp:  [35.5 ??C (95.9 ??F)-36.5 ??C (97.7 ??F)] 35.5 ??C (95.9 ??F)  Pulse:  [85-109] 109  Resp:  [16-18] 16  BP: (127-141)/(63-66) 141/66  SpO2:  [99 %-100 %] 100 % Intake/Output  I/O last 3 completed shifts:  In: 596 [P.O.:596]  Out: -         Pertinent exam findings:   General appearance - well appearing, well nourished, and in no distress  Eyes - EOMI, PERRLA, anicteric sclerae, pink conjunctiva  Mouth - moist mucous membranes, no pharyngeal erythema or exudates  Neck - Trachea midline, normal neck movement  Lymphatics - not examined  Heart -  regular rate and rhythm, normal S1/S2, no gallops, rubs, or murmurs  Chest - equal expansion, clear to auscultation, no wheezes, rhonchi, or rales  Abdomen - soft, nontender, nondistended, no masses or organomegaly  Extremities - No lower extremity edema, no clubbing or cyanosis  Skin - normal coloration and turgor, no rashes, no suspicious skin lesions noted  Neurological - alert, oriented, normal speech, no focal findings or movement disorder noted      Pertinent Imaging Data:   - Ct chest on 03/02/24 showed  a large 4.6 cm mass in the peribronchovascular superior segment of the right lower lobe with transfissural extension to the posterior right upper lobe. 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     Current vent settings:       Arterial Blood Gas:   No results for input(s): SPECTYPEART, PHART, PCO2ART, PO2ART, HCO3ART, BEART, O2SATART in the last 24 hours.     Venous Blood Gas:   No results for input(s): PHVEN, PCO2VEN, PO2VEN, HCO3VEN, BEVEN, O2SATVEN in the last 24 hours.     Cultures:  No results found for: BLOOD CULTURE, BLOOD CULTURE, ROUTINE, URINE CULTURE, COMPREHENSIVE, URINE CULTURE, COMPREHENSIVE, LOWER RESPIRATORY CULTURE  WBC (10*9/L)   Date Value   02/26/2024 11.1     WBC, UA (/HPF)   Date Value   02/26/2024 <1          Other Labs:  Lab Results   Component Value Date    WBC 11.1 02/26/2024    HGB 11.1 (L) 02/26/2024    HCT 33.0 (L) 02/26/2024    PLT 636 (H) 02/26/2024     Lab Results   Component Value Date    NA 144 03/02/2024    K 3.9 03/02/2024    CL 106 03/02/2024    CO2 27.0 03/02/2024    BUN 21 03/02/2024    CREATININE 0.72 03/02/2024    GLU 104 03/02/2024    CALCIUM 9.1 03/02/2024    MG 2.0 03/02/2024    PHOS 3.2 03/02/2024     Lab Results   Component Value Date    BILITOT 0.4 02/26/2024    PROT 7.1 02/26/2024    ALBUMIN 3.3 (L) 02/26/2024    ALT 26 02/26/2024    AST 21 02/26/2024    ALKPHOS 101 02/26/2024     Lab Results   Component Value Date    INR 1.02 12/15/2021    APTT 30.8 12/15/2021       Allergies & Home Medications   Personally reviewed in Epic    Continuous Infusions:   Infusions Meds[1]    Scheduled Medications:   Scheduled Medications[2]    PRN medications:  PRN Medications[3]           Medical Decision Making on 03/02/2024     External Notes:    I reviewed the following: Progress note(s) , Consultant note(s) , ED note(s), and H&P note(s)    Results:   I reviewed the following labs/reports for this consult - imaging abnormal for lung mass    Tests Ordered:   See recommendations above    Historian: no independent historian required    Imaging: I independently viewed and interpreted the following studies for this consult - CT images and MRI images       Risks of management:    - Decision regarding minor surgery with identified risk factors.                     [  1] [2]    atorvastatin   80 mg Oral Daily    lurasidone   20 mg Oral Daily    mirtazapine   30 mg Oral Nightly    pantoprazole   20 mg Oral Daily before breakfast    thiamine  mononitrate (vit B1)  100 mg Oral Daily    traZODone   150 mg Oral Nightly   [3] melatonin, cough/sore throat lozenge, QUEtiapine , senna

## 2024-03-02 NOTE — Progress Notes (Signed)
 Psychiatry      Daily Progress Note      Admit date/time: 02/26/2024  5:00 PM   LOS: 2 days      Assessment:    Patient is a 69 y.o. 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    Diagnostic formulation:   The patient's presentation, including symptoms of low mood, difficulty sleeping, anhedonia, weight loss, difficulty concentrating, anxiety, and recent suicide attempt (via cutting wrists), appears to be most consistent with a diagnosis of major depressive disorder. Could consider bipolar disorder given reported history; however, lack of any clear hypomanic or manic episodes in her history make this less likely. Should also consider the contribution of trauma symptoms given diagnosis of PTSD, history of trauma, and reports of nightmares and hypervigilance. Must also consider contribution of psychosocial stressors, as per daughter patient's anxiety significantly worsened after receiving court summons for financial stressors in October.     Additionally, patient and daughters report sub-acute cognitive decline (confusion, inability to complete IDLs, getting lost in familiar situations) over the last year that abruptly and significantly worsened in December. She was receiving work-up for potential shunt dysfunction with her outpatient neurosurgeon, who she sees for a history of NPH, but did not complete imaging due to anxiety. Should also consider potential underlying neurocognitive disorder in addition to shunt dysfunction given MRI from 2023 with chronic small vessel ischemic changes. Will consider repeat imaging and neurocognitive testing this admission.    As of 03/02/2024, continued hospitalization is warranted given continued debilitating anxiety and depression, inability to care for self independently, complex medical conditions requiring treatment, memory difficulties, medication titration, and need for safe discharge planning. Today, patient reports continued anxiety and feeling overwhelmed given the multiple treatment teams. She expresses continued depressed mood and feelings of anxiety.     Medically, patient was seen by neurosurgery yesterday for an adjustment of her shunt settings. Additionally, the patient obtained a chest CT scan which revealed a large 4.6 cm mass centered in the peribronchovascular superior segment of the right lower lobe concerning for primary lung cancer with metastatic lymphadenopathy. The scan also was concerning for possible pericardial metastatic disease and smoking-related lung disease with emphysema. Discussed CT scan findings with patient today, and she was appropriately tearful and distressed by the news. Medical oncology team has also been made aware and will be seeing the patient and providing recommendations. Plan for additional imaging and will consult Interventional Pulmonology for biopsy.       Diagnoses:   Active Problems:    Severe episode of recurrent major depressive disorder, without psychotic features    (CMS-HCC)    Anxiety      Plan:  Safety:  -- Continue admission to inpatient psychiatric unit for safety, stabilization, and treatment.     -- Civil Commitment Status:Involuntary    -- On unit/off unit level of supervision: q15 min checks / 2:4 (staff:patient)  -- Medical equipment: The treatment team has determined that this patient requires the continued use of medical equipment  medical bed for their care and healing. Specific medical rationale for need for equipment: advanced age (69 or above),        Psychiatry:  # MDD  -- Latuda  20 mg daily  -- Mirtazapine  30 mg nightly  -- Trazodone  150 mg nightly     # Self-Inflicted Wrist Wound   --  Consult wound nurse   -- Wound care evaluated lesion 1/21     # Therapy Interventions:  -- RT, OT, Milieu therapy    Medical:    #Lung mass w/ possible metastasis  -- Consult oncology, recs appreciated    -- Consult  interventional pulmonology for potential biopsy of 4.6 cm    peribronchovascular mass    -- CT AP w/ contrast and MRI brain w/wo contrast for staging     #Nutritional Rehabilitation  -- Start 100mg  Vitamin B1  -- BMP, Mg, Phos q24 labs to monitor for refeeding syndrome     # HLD  -- continue home 80mg  atorvastatin      #GERD  -- continue home 20mg  pantoprazole       #Constipation  -- start nightly senna per patient report     #NPH, sp shunt (2023)  --Consult neurosurgery, recs appreciated   -- NSGY adjusted shunt to 110 on 1/21     #Essential Thrombocythemia   --Consult hematology, recs appreciated   -- Defer ET medications at this time given presence of subdural hematomas   -- Discussed with hematology in light of recent lung findings, will continue to defer for the moment    -- Metabolic Monitoring:   Initial Weight: Weight: 45.5 kg (100 lb 4.8 oz)  Last Weight: Weight: 45.5 kg (100 lb 4.8 oz)  Last BMI: Body mass index is 16.69 kg/m??.  Admit BP: BP: 115/92  Last BP: BP: 141/66  Lipid Panel:   Lab Results   Component Value Date    Cholesterol, Total 152 12/08/2022    LDL Direct 53.0 08/24/2023    Cholesterol, LDL, Calculated 111 (H) 03/11/2022    Cholesterol, HDL 58 03/11/2022    Triglycerides 104 03/11/2022     Hemoglobin A1C:   Lab Results   Component Value Date    Hemoglobin A1C 5.3 05/29/2022      Fasting Blood Sugar:   Lab Results   Component Value Date    Glucose 107 (H) 12/16/2023         Social/Disposition:  -- TBD, daughters working with SW to identify alternative living situations    Patient was seen and plan of care was discussed with the Attending MD, Dr. Vicci , who agrees with the above statement and plan.     Noretta [name] Cong, MD    Subjective:    Hours of sleep overnight :   7.73       Per 03/01/24 RN Epic note:   QUEtiapine  was administered PRN to support mood and behavioral management during the shift.  Menthol  was administered PRN for cough in the afternoon.  Safety interventions were maintained and no self-harm or unsafe behaviors were observed during the shift.    Overall, the patient remained safe and stable with no significant changes in condition.     MD interview:   Feeling okay this morning. Yesterday was busy, went down for CT scan. Physically feels tired today, sleeping so-so. Had bowel movement after taking senna last night. Often feels cold at night. Group was okay yesterday, enjoyed talking with OT. Reviewed that neurosurgery came by and adjusted things yesterday. Prn seroquel  helped with anxiety prior to them coming. Appetite is not great. Mood: it's a struggle. Still feeling overwhelmed today. .    Collateral  Spoke to patient's daughter Duwaine Brandy for 10 minutes. She saw the CT Chest results this morning and is flying back from [place]  now. Will arrive to the unit around 7p tonight. Discussed  the Chest CT findings in depth, as well as current knowns and unknows. Updated on plan for imaging, biopsy, and oncology involvement, and daughter expressed her appreciation.       Objective:    Vitals:  Vitals:    03/02/24 0754   BP: 141/66   Pulse: 109   Resp: 16   Temp: 35.5 ??C (95.9 ??F)   SpO2: 100%       Mental Status Exam:    Appearance:    Appears stated age and Malnourished   Motor:   No abnormal movements   Speech/Language:    Normal rate, volume, tone, fluency and Language intact, well formed   Mood:   Anxious   Affect:   Anxious and Cooperative   Thought process:   Logical, linear, clear, coherent, goal directed   Thought content:     Denies SI, HI, self harm, delusions, obsessions, paranoid ideation, or ideas of reference   Perceptual disturbances:     Denies auditory and visual hallucinations, behavior not concerning for response to internal stimuli     Insight:     Fair   Judgment:    Impaired   Impulse Control:   Impaired   Other:       PE:   Gen: No acute distress, well developed and well nourished  Resp: Normal work of breathing  Neuro: no tics/tremors    Test Results:  Data Review: Lab results last 24 hours:    Recent Results (from the past 24 hours)   Basic Metabolic Panel    Collection Time: 03/02/24 10:08 AM   Result Value Ref Range    Sodium 144 135 - 145 mmol/L    Potassium 3.9 3.4 - 4.8 mmol/L    Chloride 106 98 - 107 mmol/L    CO2 27.0 20.0 - 31.0 mmol/L    Anion Gap 11 5 - 14 mmol/L    BUN 21 9 - 23 mg/dL    Creatinine 9.27 9.44 - 1.02 mg/dL    BUN/Creatinine Ratio 29     eGFR CKD-EPI (2021) Female >90 >=60 mL/min/1.6m2    Glucose 104 70 - 179 mg/dL    Calcium 9.1 8.7 - 89.5 mg/dL   Magnesium Level    Collection Time: 03/02/24 10:08 AM   Result Value Ref Range    Magnesium 2.0 1.6 - 2.6 mg/dL   Phosphorus Level    Collection Time: 03/02/24 10:08 AM   Result Value Ref Range    Phosphorus 3.2 2.4 - 5.1 mg/dL     Imaging: Radiology report(s) reviewed.    Psychometrics:    Social Drivers of Health with Concerns     Tobacco Use: Medium Risk (03/01/2024)    Patient History     Smoking Tobacco Use: Former     Smokeless Tobacco Use: Never     Passive Exposure: Never   Physical Activity: Not on file   Stress: Not on file   Substance Use: Not on file (12/14/2022)   Social Connections: Socially Isolated (02/09/2024)    Received from [facility]    Social Connection and Isolation Panel     Frequency of Communication with Friends and Family: Three times a week     Frequency of Social Gatherings with Friends and Family: Three times a week     How often do you attend church or religious services?: Never     Do you belong to any clubs or organizations such as church groups, unions, fraternal or athletic groups, or school  groups?: No     How often do you attend meetings of the clubs or organizations you belong to?: Never     Marital Status: Divorced   Physicist, Medical Strain: Patient Declined (10/15/2023)    Overall Financial Resource Strain (CARDIA)     Difficulty of Paying Living Expenses: Patient declined Recent Concern: Financial Resource Strain - Medium Risk (09/23/2023)    Received from [facility]    Overall Financial Resource Strain (CARDIA)     Difficulty of Paying Living Expenses: Somewhat hard   Health Literacy: Not on file   Internet Connectivity: Internet connectivity concern identified (10/15/2023)    Internet Connectivity     Do you have access to internet services: No     How do you connect to the internet: Not on file     Is your internet connection strong enough for you to watch video on your device without major problems?: Not on file     Do you have enough data to get through the month?: Not on file     Does at least one of the devices have a camera that you can use for video chat?: Not on file        ---  Time-based billing disclaimer:  I personally spent 60   minutes face-to-face and non-face-to-face in the care of this patient, which includes all pre, intra, and post visit time on the date of service.  All documented time was specific to the E/M visit and does not include any procedures that may have been performed.

## 2024-03-03 LAB — CBC W/ AUTO DIFF
BASOPHILS ABSOLUTE COUNT: 0.1 10*9/L (ref 0.0–0.1)
BASOPHILS RELATIVE PERCENT: 0.7 %
EOSINOPHILS ABSOLUTE COUNT: 0.2 10*9/L (ref 0.0–0.5)
EOSINOPHILS RELATIVE PERCENT: 1.2 %
HEMATOCRIT: 34.6 % (ref 34.0–44.0)
HEMOGLOBIN: 11.6 g/dL (ref 11.3–14.9)
LYMPHOCYTES ABSOLUTE COUNT: 0.8 10*9/L — ABNORMAL LOW (ref 1.1–3.6)
LYMPHOCYTES RELATIVE PERCENT: 5.9 %
MEAN CORPUSCULAR HEMOGLOBIN CONC: 33.4 g/dL (ref 32.0–36.0)
MEAN CORPUSCULAR HEMOGLOBIN: 29.1 pg (ref 25.9–32.4)
MEAN CORPUSCULAR VOLUME: 87.1 fL (ref 77.6–95.7)
MEAN PLATELET VOLUME: 7.9 fL (ref 6.8–10.7)
MONOCYTES ABSOLUTE COUNT: 0.8 10*9/L (ref 0.3–0.8)
MONOCYTES RELATIVE PERCENT: 5.6 %
NEUTROPHILS ABSOLUTE COUNT: 11.5 10*9/L — ABNORMAL HIGH (ref 1.8–7.8)
NEUTROPHILS RELATIVE PERCENT: 86.6 %
PLATELET COUNT: 611 10*9/L — ABNORMAL HIGH (ref 150–450)
RED BLOOD CELL COUNT: 3.98 10*12/L (ref 3.95–5.13)
RED CELL DISTRIBUTION WIDTH: 14 % (ref 12.2–15.2)
WBC ADJUSTED: 13.3 10*9/L — ABNORMAL HIGH (ref 3.6–11.2)

## 2024-03-03 LAB — BASIC METABOLIC PANEL
ANION GAP: 11 mmol/L (ref 5–14)
BLOOD UREA NITROGEN: 18 mg/dL (ref 9–23)
BUN / CREAT RATIO: 28
CALCIUM: 9.3 mg/dL (ref 8.7–10.4)
CHLORIDE: 105 mmol/L (ref 98–107)
CO2: 25 mmol/L (ref 20.0–31.0)
CREATININE: 0.64 mg/dL (ref 0.55–1.02)
EGFR CKD-EPI (2021) FEMALE: >90 mL/min/{1.73_m2} (ref >=60)
GLUCOSE RANDOM: 151 mg/dL (ref 70–179)
POTASSIUM: 3.8 mmol/L (ref 3.4–4.8)
SODIUM: 141 mmol/L (ref 135–145)

## 2024-03-03 LAB — URINALYSIS WITH MICROSCOPY
BACTERIA: NONE SEEN /HPF
BILIRUBIN UA: NEGATIVE
BLOOD UA: NEGATIVE
GLUCOSE UA: NEGATIVE
KETONES UA: NEGATIVE
LEUKOCYTE ESTERASE UA: NEGATIVE
NITRITE UA: NEGATIVE
PH UA: 6 (ref 5.0–9.0)
PROTEIN UA: NEGATIVE
RBC UA: <1 /HPF (ref <=4)
SPECIFIC GRAVITY UA: 1.007 (ref 1.003–1.030)
SQUAMOUS EPITHELIAL: 1 /HPF (ref 0–5)
UROBILINOGEN UA: <2
WBC UA: <1 /HPF (ref 0–5)

## 2024-03-03 LAB — MAGNESIUM: MAGNESIUM: 2 mg/dL (ref 1.6–2.6)

## 2024-03-03 LAB — PHOSPHORUS: PHOSPHORUS: 3.4 mg/dL (ref 2.4–5.1)

## 2024-03-03 MED ADMIN — traZODone (DESYREL) tablet 150 mg: 150 mg | ORAL | @ 02:00:00

## 2024-03-03 MED ADMIN — atorvastatin (LIPITOR) tablet 80 mg: 80 mg | ORAL | @ 14:00:00

## 2024-03-03 MED ADMIN — thiamine mononitrate (vit B1) tablet 100 mg: 100 mg | ORAL | @ 14:00:00

## 2024-03-03 MED ADMIN — lurasidone (LATUDA) tablet 20 mg: 20 mg | ORAL | @ 14:00:00

## 2024-03-03 MED ADMIN — pantoprazole (Protonix) EC tablet 20 mg: 20 mg | ORAL | @ 14:00:00

## 2024-03-03 MED ADMIN — mirtazapine (REMERON) tablet 30 mg: 30 mg | ORAL | @ 02:00:00

## 2024-03-03 NOTE — Plan of Care (Addendum)
 1:1 spent with patient. Patient A/O x 4. Denies SI, HI, and AVH. Patient is medication compliant and cooperative with staff. Patient is independent with ambulation and ADL's. No falls this shift. Meal intake adequate. Patient withdrawn and anxious. Affect sad and depressed. No unsafe behaviors at this time. Rapid response team called later in the shift for change in VS. Rapid was canceled following VS WDL. Pt does not appear to be in any distress.Monitoring ongoing

## 2024-03-03 NOTE — Progress Notes (Signed)
 Psychiatry      Daily Progress Note      Admit date/time: 02/26/2024  5:00 PM   LOS: 3 days      Assessment:    Patient is a 69 y.o. 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    Diagnostic formulation:   The patient's presentation, including symptoms of low mood, difficulty sleeping, anhedonia, weight loss, difficulty concentrating, anxiety, and recent suicide attempt (via cutting wrists), appears to be most consistent with a diagnosis of major depressive disorder. Could consider bipolar disorder given reported history; however, lack of any clear hypomanic or manic episodes in her history make this less likely. Should also consider the contribution of trauma symptoms given diagnosis of PTSD, history of trauma, and reports of nightmares and hypervigilance. Must also consider contribution of psychosocial stressors, as per daughter patient's anxiety significantly worsened after receiving court summons for financial stressors in October.     Additionally, patient and daughters report sub-acute cognitive decline (confusion, inability to complete IDLs, getting lost in familiar situations) over the last year that abruptly and significantly worsened in December. She was receiving work-up for potential shunt dysfunction with her outpatient neurosurgeon, who she sees for a history of NPH, but did not complete imaging due to anxiety. Should also consider potential underlying neurocognitive disorder in addition to shunt dysfunction given MRI from 2023 with chronic small vessel ischemic changes. Will consider repeat imaging and neurocognitive testing this admission.    As of 03/03/2024, continued hospitalization is warranted given continued debilitating anxiety and depression, inability to care for self independently, complex medical conditions requiring treatment, memory difficulties, medication titration, and need for safe discharge planning. Today, patient reports continued anxiety and feeling overwhelmed given the findings of the CT scan and the multiple treatment teams.Patient does not feel hopeful of results.      Medically, patient was seen by interventional pulmonology yesterday for an consult on next steps of her lung mass. The patient was made aware of getting a minimally invasive biopsy with bronchoscopy next week. Additionally, the patient was brought papers for a MRI but was overwhelmed and declined signing. Overnight the patient also endorsed increase intensity of her cough with clear sputum. Plan for RSV, COVID, and Influenza testing in addition to a UA given symptoms and most recent elevated WBC count. Will also consult IM for guidance on further infectious work-up given her multiple complex co-morbidities and potential immunocompromised status. Patient afebrile with VSS this morning. The patient was able to consent and sign for MRI this morning after having her questions answered. Plan to obtain MRI Brain and CT Abd/Pelvis for more information on potential metastasis and staging, prior to biopsy.       Diagnoses:   Active Problems:    Severe episode of recurrent major depressive disorder, without psychotic features    (CMS-HCC)    Anxiety      Plan:  Safety:  -- Continue admission to inpatient psychiatric unit for safety, stabilization, and treatment.     -- Civil Commitment Status:Involuntary    -- On unit/off unit level of supervision: q15 min checks / Restrict to Unit  -- Medical equipment: The treatment team has determined that this patient requires the continued use of medical equipment  medical bed for their care and healing. 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  -- Consult oncology, recs appreciated   -- Interventional pulmonology plan for bronchoscopy with EBUS later next week   -- CT AP w/ contrast and MRI brain w/wo contrast for staging     #Nutritional Rehabilitation  -- Start 100mg  Vitamin B1  -- BMP, Mg, Phos q24 labs to monitor for refeeding syndrome     # HLD  -- continue home 80mg  atorvastatin      #GERD  -- continue home 20mg  pantoprazole       #Constipation  -- start nightly senna per patient report     #NPH, sp shunt (2023)  --Consult neurosurgery, recs appreciated   -- NSGY adjusted shunt to 110 on 1/21     #Essential Thrombocythemia   --Consult hematology, recs appreciated   -- Defer ET medications at this time given presence of subdural hematomas   -- Discussed with hematology in light of recent lung findings, will continue to defer for the moment    -- Metabolic Monitoring:   Initial Weight: Weight: 45.5 kg (100 lb 4.8 oz)  Last Weight: Weight: 45.5 kg (100 lb 4.8 oz)  Last BMI: Body mass index is 16.69 kg/m??.  Admit BP: BP: 115/92  Last BP: BP: 136/70  Lipid Panel:   Lab Results   Component Value Date    Cholesterol, Total 152 12/08/2022    LDL Direct 53.0 08/24/2023    Cholesterol, LDL, Calculated 111 (H) 03/11/2022    Cholesterol, HDL 58 03/11/2022    Triglycerides 104 03/11/2022     Hemoglobin A1C:   Lab Results   Component Value Date    Hemoglobin A1C 5.3 05/29/2022      Fasting Blood Sugar:   Lab Results   Component Value Date    Glucose 107 (H) 12/16/2023         Social/Disposition:  -- TBD, daughters working with SW to identify alternative living situations    Patient was seen and plan of care was discussed with the Attending MD, Dr. Vicci , who agrees with the above statement and plan.     Noretta [name] Cong, MD    Subjective:    Hours of sleep overnight : 7.23       Per 03/02/24 RN Epic note:     Pt was visited by her daughter, visit went well. Pt went to bed after visit. She is quiet,sad  appears anxious. She is med compliant declined PRN med. No unsafe behavior observed. Pt slept well.   Safety interventions were maintained and no self-harm or unsafe behaviors were observed during the shift.    Overall, the patient remained safe and stable with no significant changes in condition.     MD interview:   Feeling tired today, was restless last night. Wants to discuss MRI consent form. Has a headache today, difficult talking because it exacerbates her cough. Denies nausea, vomiting, dysuria. Endorses clear productive cough. Review infectious work up. Just feeling very tired today. Discuss plan for consultants to do biopsy next week.     Collateral  Spoke to patient's daughter Duwaine Brandy for 10 minutes. Provided updates on lab  findings, infectious work-up, and plan for imaging. Daughter visited with patient last night and noted that patient's confusion appears to be significantly worse. Patient was perseverative on cost last night, despite reassurance from her daughter that they have already reached the out-of-pocket deductible. Daughter has noticed that her level of paranoia has been very high. She was worried that she had gotten one of the nurses fired because she asked if she could reach out to her therapy clients and thinks she got someone in trouble. She was very worried that she was going to be sued and that her daughters would be involved. She seems more worried about other consequences than her new diagnosis of cancer. She was very worried about everybody else.     Objective:    Vitals:  Vitals:    03/03/24 0823   BP: 136/70   Pulse: 102   Resp: 19   Temp: 36.6 ??C (97.9 ??F)   SpO2: 98%       Mental Status Exam:    Appearance:    Appears stated age and Malnourished   Motor:   No abnormal movements   Speech/Language:    Normal rate, volume, tone, fluency and Language intact, well formed    Mood:   Anxious and overwhelmed   Affect:   Anxious and Cooperative, tearful at times   Thought process:   Logical, linear, clear, coherent, goal directed   Thought content:     Denies SI, HI, self harm, delusions, obsessions, paranoid ideation, or ideas of reference   Perceptual disturbances:     Denies auditory and visual hallucinations, behavior not concerning for response to internal stimuli     Insight:     Fair   Judgment:    Impaired   Impulse Control:   Impaired   Other:       PE:   Gen: No acute distress, well developed and well nourished  Resp: Normal work of breathing, intermittent coughing   Neuro: no tics/tremors    Test Results:  Data Review: Lab results last 24 hours:    Recent Results (from the past 24 hours)   Basic Metabolic Panel    Collection Time: 03/03/24  8:40 AM   Result Value Ref Range    Sodium 141 135 - 145 mmol/L    Potassium 3.8 3.4 - 4.8 mmol/L    Chloride 105 98 - 107 mmol/L    CO2 25.0 20.0 - 31.0 mmol/L    Anion Gap 11 5 - 14 mmol/L    BUN 18 9 - 23 mg/dL    Creatinine 9.35 9.44 - 1.02 mg/dL    BUN/Creatinine Ratio 28     eGFR CKD-EPI (2021) Female >90 >=60 mL/min/1.29m2    Glucose 151 70 - 179 mg/dL    Calcium 9.3 8.7 - 89.5 mg/dL   Magnesium Level    Collection Time: 03/03/24  8:40 AM   Result Value Ref Range    Magnesium 2.0 1.6 - 2.6 mg/dL   Phosphorus Level    Collection Time: 03/03/24  8:40 AM   Result Value Ref Range    Phosphorus 3.4 2.4 - 5.1 mg/dL   CBC w/ Differential    Collection Time: 03/03/24  8:40 AM   Result Value Ref Range    WBC 13.3 (H) 3.6 - 11.2 10*9/L    RBC 3.98 3.95 - 5.13 10*12/L    HGB 11.6 11.3 - 14.9 g/dL    HCT 65.3 65.9 - 55.9 %    MCV 87.1  77.6 - 95.7 fL    MCH 29.1 25.9 - 32.4 pg    MCHC 33.4 32.0 - 36.0 g/dL    RDW 85.9 87.7 - 84.7 %    MPV 7.9 6.8 - 10.7 fL    Platelet 611 (H) 150 - 450 10*9/L    Neutrophils % 86.6 %    Lymphocytes % 5.9 %    Monocytes % 5.6 %    Eosinophils % 1.2 %    Basophils % 0.7 %    Absolute Neutrophils 11.5 (H) 1.8 - 7.8 10*9/L    Absolute Lymphocytes 0.8 (L) 1.1 - 3.6 10*9/L    Absolute Monocytes 0.8 0.3 - 0.8 10*9/L    Absolute Eosinophils 0.2 0.0 - 0.5 10*9/L    Absolute Basophils 0.1 0.0 - 0.1 10*9/L   RAPID INFLUENZA/RSV/COVID PCR    Collection Time: 03/03/24 11:45 AM    Specimen: Nasopharyngeal Swab   Result Value Ref Range    SARS-CoV-2 PCR Negative Negative    Influenza A Negative Negative    Influenza B Negative Negative    RSV Negative Negative     Imaging: Radiology report(s) reviewed.    Psychometrics:    Social Drivers of Health with Concerns     Tobacco Use: Medium Risk (03/01/2024)    Patient History     Smoking Tobacco Use: Former     Smokeless Tobacco Use: Never     Passive Exposure: Never   Physical Activity: Not on file   Stress: Not on file   Substance Use: Not on file (12/14/2022)   Social Connections: Socially Isolated (02/09/2024)    Received from [facility]    Social Connection and Isolation Panel     Frequency of Communication with Friends and Family: Three times a week     Frequency of Social Gatherings with Friends and Family: Three times a week     How often do you attend church or religious services?: Never     Do you belong to any clubs or organizations such as church groups, unions, fraternal or athletic groups, or school groups?: No     How often do you attend meetings of the clubs or organizations you belong to?: Never     Marital Status: Divorced   Physicist, Medical Strain: Patient Declined (10/15/2023)    Overall Financial Resource Strain (CARDIA)     Difficulty of Paying Living Expenses: Patient declined   Recent Concern: Physicist, Medical Strain - Medium Risk (09/23/2023)    Received from [facility]    Overall Financial Resource Strain (CARDIA)     Difficulty of Paying Living Expenses: Somewhat hard   Health Literacy: Not on file   Internet Connectivity: Internet connectivity concern identified (10/15/2023)    Internet Connectivity     Do you have access to internet services: No     How do you connect to the internet: Not on file     Is your internet connection strong enough for you to watch video on your device without major problems?: Not on file     Do you have enough data to get through the month?: Not on file     Does at least one of the devices have a camera that you can use for video chat?: Not on file        ---  Time-based billing disclaimer:  I personally spent 90   minutes face-to-face and non-face-to-face in the care of this patient, which includes all pre, intra, and post  visit time on the date of service.  All documented time was specific to the E/M visit and does not include any procedures that may have been performed.

## 2024-03-03 NOTE — Consults (Signed)
 Please see original consult completed 1/22

## 2024-03-03 NOTE — Consults (Signed)
 PHYSICAL THERAPY  Evaluation (03/03/24 1244)          Patient Name:  Christy Buchanan       Medical Record Number: [id number]   Date of Birth: April 02, 1955  Sex: Female        Post-Discharge Physical Therapy Recommendations:  PT Post Acute Discharge Recommendations: Skilled PT services NOT indicated (*defer to MD discretion regarding potential Vestibular follow up)   Discharge transport recommendations : Ambulatory  Equipment Recommendation  PT DME Recommendations: None                   ASSESSMENT  Problem List: Decreased endurance, Dizziness/vertigo, At risk for deconditioning      Assessment : 69 y.o. 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 She has right hilar, mediastinal, right anterior diaphragmatic, and right internal mammary lymphadenopathy, concern for malignancy. Physical Therapy Consult for mobility assessment received and appreciated. Patient presents standing in room and demonstrated the ability to perform all transfers and ambulate all unit distances indep without device, limited primarily by mild endurance deficit and c/c of dizziness only in bed when I turn to the left side. Vestibular involvement assessment limited due to pt unwilling to participate in Dix-Hallpike Maneuver. *Note: pt symptoms do not appear consistent with BPPV, however she does present with upper respiratory sxs (congestion, cough) which may be contributing factors.  Pt grossly indep with all assessed mobility without a device and declined offer of RW to use during this admission (as a precaution). Pt currently without additional skilled PT mobility goals/needs at this time.  Please see Today's Interventions section for additional assessment and session detail. Will discontinue acute PT at this time. Recommend continued frequent unit ambulation to maintain/improve endurance.      Today's Interventions: Mobility assessment completed, pt indep with all without device.  Vestibular involvement: pt declined to participate in Dix-Hallpike Maneuver (I'm afraid I will get stuck being dizzy all the time.). Rapid reciprocal head turns with ambulation did not elicit dizziness.  Pt stating dizziness only occurs when she is in bed and turns onto her left side, which she has avoided for last few days, therefor has been largely asymptomatic. Pt unwilling to attempt to re-create symptoms by rolling to left in bed.  Suspect current acute upper respiratory symptoms being contributing factors. . Pt Education: POC, anticipated course of cont recovery, importance of frequent unit mobility and hydration. Patient verbalized understanding.     Personal Factors/Comorbidities Present: 1-2 factors   Examination of Body systems: 3+ elements  Clinical Presentation: Evolving    Eval Complexity : Moderate Complexity     Activity Tolerance: Tolerated treatment well       PLAN  Planned Frequency of Treatment: Plan of Care Initiated: 03/03/24  D/C Services Weekly Frequency: D/C Services        Planned Interventions:       Goals:   Patient and Family Goals: did not verbalize     SHORT GOAL #1: pt indep with all assessment mobility and without additional acute PT mobility goals/needs  Prognosis:  Good  Positive Indicators: relatively high reported PLOF, and assessed CLOF        SUBJECTIVE  Communication Preference: Verbal     Patient reports: agreeable to session  Pain Comments: denies acute pain at rest        Prior Functional Status: indep all mobility without device at reported baseline  Living Situation  Living Environment: House               Past Medical History[1]         Social History     Tobacco Use    Smoking status: Former     Types: Cigarettes     Passive exposure: Never    Smokeless tobacco: Never   Substance Use Topics    Alcohol use: Not Currently     Comment: rarely       Past Surgical History[2]          Family History[3]     Allergies: Escitalopram                   Objective Findings  Precautions / Restrictions  Precautions: Psych Safety precautions              Vitals/Orthostatics : asymptomatic     Cognition comment: pleasant and cooperative  Visual/Perception: Wears Glasses/Contacts all the time        Skin Inspection: Intact where visualized     Upper Extremities  UE comment: WFL grossly, deferred formal MMT due to frail    Lower Extremities  LE comment: WFL grossly, deferred formal MMT due to frail                           Bed Mobility              Bed Mobility comments: supine to/from sit: indep    Transfers        Transfer comments: sit to/from stand: indep without device    Ambulation  Level of Assistance: Independent  Assistive Device:  (none)   Distance Ambulated (ft): 120 ft   Ambulation comments: pt reports she has been ambulating all unit distances indep without device       Stairs:NT                Patient at end of session: In chair (in milieu)            AM-PAC-6 click  Help currently need turning over In bed?: None - Modified Independent/Independent  Help currently needed sitting down/standing up from chair with arms? : None - Modified Independent/Independent  Help currently needed moving from supine to sitting on edge of bed?: None - Modified Independent/Independent  Help currently needed moving to and from bed from wheelchair?: None - Modified Independent/Independent  Help currently needed walking in a hospital room?: None - Modified Independent/Independent  Help currently needed climbing 3-5 steps with railing?: A Little - Minimal/Contact Guard Assist/Supervision    Basic Mobility Score 6 click: 23    6 click Score (in points): % of Functional Impairment, Limitation, Restriction  6: 100% impaired, limited, restricted  7-8: At least 80%, but less than 100% impaired, limited restricted  9-13: At least 60%, but less than 80% impaired, limited restricted  14-19: At least 40%, but less than 60% impaired, limited restricted  20-22: At least 20%, but less than 40% impaired, limited restricted  23: At least 1%, but less than 20% impaired, limited restricted  24: 0% impaired, limited restricted    'AM-PAC' forms are Copyright protected by The Trustees of [employer] [facility]     Physical Therapy Session Duration  PT Individual [mins]: 10    I attest that I have reviewed the above information.  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    Placed 11.2023  [place]  150 is setting 11.2023    Vitamin D deficiency    [2]   Past Surgical History:  Procedure Laterality Date    AUGMENTATION MAMMAPLASTY Bilateral     24ish yrs    BREAST BIOPSY Left     malignant    CHEMOTHERAPY      MASTECTOMY Left 25 ish yrs ago    PR CREATE SHUNT:VENTRIC-PERITONEAL Right 12/30/2021    Procedure: CREAT SHUNT; VENTRICULO-PERITONEAL/PLEURAL;  Surgeon: Kari Donnice Gaster, MD;  Location: MAIN OR [place];  Service: Neurosurgery    PR LAP, SURG ENTEROLYSIS Right 12/30/2021    Procedure: LAPAROSCOPY, SURGICAL, ENTEROLYSIS (FREEING OF INTESTINAL ADHESION) (SEPARATE PROCEDURE);  Surgeon: Carlin Curtistine Route, MD;  Location: MAIN OR [facility];  Service: Trauma    RADIATION Left    [3]   Family History  Problem Relation Age of Onset    Cancer Mother     Heart disease Mother     Colon cancer Father     Cancer Father     No Known Problems Sister     No Known Problems Brother     BRCA 1/2 Daughter     BRCA 1/2 Daughter     BRCA 1/2 Daughter     No Known Problems Son     No Known Problems Maternal Grandmother     No Known Problems Maternal Grandfather     No Known Problems Paternal Grandmother     No Known Problems Paternal Grandfather     No Known Problems Other     Breast cancer Neg Hx     Endometrial cancer Neg Hx     Ovarian cancer Neg Hx

## 2024-03-03 NOTE — Consults (Signed)
 Internal Medicine Consult Service    Assessment and Recommendations:   Christy Buchanan is a 69 y.o. 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 Pt was seen at the request of Christy Marcos Louder, MD (Psychiatry (PSY)) for cough, SOB, fatigue.    Psychiatry (PSY) request that the medicine consult team place orders based on their recommendations: No    # Dry cough, shortness of breath  # Sore throat     # Fatigue, malaise  WBC 13.3 (up from 11.1 yesterday). Think this is likely an upper respiratory infection given constellation of symptoms, lack of hypoxia.  - Agree with flu/covid/rsv testing-negative   - Supportive cares:  - Albuterol  inhaler prn  - Guaifenesin  prn   - Encourage oral hydration  - If patient develops a fever or hypoxia, would recommend obtaining a chest xray    # Peribronchial RLL Mass  # Right hilar, mediastinal, right anterior diaphragmatic and right internal mammary LAD  - Oncology, interventional pulmonology on board    For questions between 7:30AM-5PM, please page the Medicine Consult Service pager at [phone]. After 5PM, the Medicine Consult Service is covered by the MED B On-Call Resident [phone]) for urgent/emergent questions or concerns.     Reason for Consultation:   Pt was seen at the request of Christy Marcos Louder, MD (Psychiatry (PSY)) in consultation for cough, SOB, fatigue.    Subjective:   HPI:  Christy Buchanan is a 69 y.o. 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    While on the inpatient psychiatry unit, patient underwent CT chest wo contrast on 03/01/2024 given history of smoking, age appropriate cancer screening, subacute cough, and concern for unexplained weight loss. This showed:    Large 4.6 cm mass centered in the peribronchovascular superior segment of the right lower lobe with transfissural extension to the posterior right upper lobe. Right hilar, mediastinal, right anterior diaphragmatic and right internal mammary lymphadenopathy. Findings compatible with primary lung cancer with metastatic lymphadenopathy. Recommend further evaluation with tissue sampling.  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  Smoking-related lung disease with emphysema.    Plan is for bronchoscopy with EBUS next week.     Patient reports that she woke up this morning with new symptoms of fatigue, general malaise, sore throat, mild post-nasal drip, and increased dry cough. When asked if she feels short of breath, she states that she does because she is wearing a face mask. She denies fevers, chills, rhinorrhea, nasal congestion, or any other concerns.     Allergies:  Escitalopram     Medications:   Prior to Admission medications   Medication Dose, Route, Frequency   acetaminophen  (TYLENOL ) 325 MG tablet 650 mg, Oral, Every 4 hours PRN   atorvastatin  (LIPITOR ) 80 MG tablet 80 mg, Oral, Daily (standard)   cyanocobalamin, vitamin B-12, (VITAMIN B-12 ORAL) Oral, Daily (standard)   docusate sodium  (STOOL SOFTENER ORAL) 1 tablet, Oral, Nightly   lurasidone  (LATUDA ) 20 mg Tab 20 mg   mirtazapine  (REMERON ) 30 MG tablet    omega-3 fatty acids -fish oil  300-1,000 mg cap capsule 1 capsule,  Daily  Patient not taking: Reported on 01/31/2024   omeprazole  (PRILOSEC) 20 MG capsule 20 mg, Oral, Daily (standard)   QUEtiapine  (SEROQUEL ) 25 MG tablet 25 mg, Oral, 2 times a day (standard)   raloxifene  (EVISTA ) 60 mg tablet 60 mg, Oral, Daily (standard)   simethicone (MYLICON) 80 MG chewable tablet 80 mg, Every 6 hours PRN   traZODone  (DESYREL ) 150 MG tablet 150 mg, Oral, Nightly   vitamins B1 B6 B12 Liqd Every other day       Medical History:  Past Medical History[1]    Surgical History:  Past Surgical History[2]    Social History:  Short Social History[3]     Family History:  Family History[4]    Review of Systems:  10 systems were reviewed and are negative unless otherwise mentioned in the HPI    Objective:   Physical Exam:  Temp:  [35.9 ??C (96.6 ??F)-36.6 ??C (97.9 ??F)] 36.6 ??C (97.9 ??F)  Pulse:  [82-102] 102  Resp:  [19] 19  BP: (123-136)/(68-70) 136/70  SpO2:  [97 %-98 %] 98 %    Gen: in NAD, thin appearing, answers questions appropriately  Eyes: Sclera anicteric, EOMI, PERRLA  HENT: atraumatic, normocephalic, MMM  Heart: RRR, S1, S2, no M/R/G  Lungs: CTAB, no crackles or wheezes, no use of accessory muscles  Abdomen: Normoactive bowel sounds, soft, NTND, no rebound/guarding  Extremities: no clubbing, cyanosis, or edema: pulses are +2 in bilateral upper and lower extremities  Neuro: CN II-XI grossly intact. No focal deficits.  Skin:  No rashes, lesions on clothed exam  Psych: Alert and oriented, anxious affect.    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    Placed 11.2023  [place]  150 is setting 11.2023    Vitamin D deficiency    [2]   Past Surgical History:  Procedure Laterality Date    AUGMENTATION MAMMAPLASTY Bilateral     24ish yrs    BREAST BIOPSY Left     malignant    CHEMOTHERAPY      MASTECTOMY Left 25 ish yrs ago    PR CREATE SHUNT:VENTRIC-PERITONEAL Right 12/30/2021    Procedure: CREAT SHUNT; VENTRICULO-PERITONEAL/PLEURAL;  Surgeon: Kari Donnice Gaster, MD;  Location: MAIN OR ;  Service: Neurosurgery    PR LAP, SURG ENTEROLYSIS Right 12/30/2021    Procedure: LAPAROSCOPY, SURGICAL, ENTEROLYSIS (FREEING OF INTESTINAL ADHESION) (SEPARATE PROCEDURE);  Surgeon: Carlin Curtistine Route, MD;  Location: MAIN OR [facility];  Service: Trauma    RADIATION Left    [3]   Social History  Tobacco Use    Smoking status: Former     Types: Cigarettes     Passive exposure: Never    Smokeless tobacco: Never   Vaping Use    Vaping status: Never Used   Substance Use Topics  Alcohol use: Not Currently     Comment: rarely    Drug use: Not Currently   [4]   Family History  Problem Relation Age of Onset    Cancer Mother     Heart disease Mother     Colon cancer Father     Cancer Father     No Known Problems Sister     No Known Problems Brother     BRCA 1/2 Daughter     BRCA 1/2 Daughter     BRCA 1/2 Daughter     No Known Problems Son     No Known Problems Maternal Grandmother     No Known Problems Maternal Grandfather     No Known Problems Paternal Grandmother     No Known Problems Paternal Grandfather     No Known Problems Other     Breast cancer Neg Hx     Endometrial cancer Neg Hx     Ovarian cancer Neg Hx

## 2024-03-04 ENCOUNTER — Other Ambulatory Visit: Payer: Self-pay

## 2024-03-04 LAB — CBC W/ AUTO DIFF
BASOPHILS ABSOLUTE COUNT: 0.1 10*9/L (ref 0.0–0.1)
BASOPHILS RELATIVE PERCENT: 1 %
EOSINOPHILS ABSOLUTE COUNT: 0.3 10*9/L (ref 0.0–0.5)
EOSINOPHILS RELATIVE PERCENT: 2.5 %
HEMATOCRIT: 35.8 % (ref 34.0–44.0)
HEMOGLOBIN: 11.8 g/dL (ref 11.3–14.9)
LYMPHOCYTES ABSOLUTE COUNT: 1.2 10*9/L (ref 1.1–3.6)
LYMPHOCYTES RELATIVE PERCENT: 9.1 %
MEAN CORPUSCULAR HEMOGLOBIN CONC: 33.1 g/dL (ref 32.0–36.0)
MEAN CORPUSCULAR HEMOGLOBIN: 28.9 pg (ref 25.9–32.4)
MEAN CORPUSCULAR VOLUME: 87.5 fL (ref 77.6–95.7)
MEAN PLATELET VOLUME: 7.8 fL (ref 6.8–10.7)
MONOCYTES ABSOLUTE COUNT: 1.2 10*9/L — ABNORMAL HIGH (ref 0.3–0.8)
MONOCYTES RELATIVE PERCENT: 9.8 %
NEUTROPHILS ABSOLUTE COUNT: 9.9 10*9/L — ABNORMAL HIGH (ref 1.8–7.8)
NEUTROPHILS RELATIVE PERCENT: 77.6 %
PLATELET COUNT: 596 10*9/L — ABNORMAL HIGH (ref 150–450)
RED BLOOD CELL COUNT: 4.09 10*12/L (ref 3.95–5.13)
RED CELL DISTRIBUTION WIDTH: 14.1 % (ref 12.2–15.2)
WBC ADJUSTED: 12.8 10*9/L — ABNORMAL HIGH (ref 3.6–11.2)

## 2024-03-04 LAB — BASIC METABOLIC PANEL
ANION GAP: 13 mmol/L (ref 5–14)
BLOOD UREA NITROGEN: 16 mg/dL (ref 9–23)
BUN / CREAT RATIO: 23
CALCIUM: 9.4 mg/dL (ref 8.7–10.4)
CHLORIDE: 105 mmol/L (ref 98–107)
CO2: 28 mmol/L (ref 20.0–31.0)
CREATININE: 0.69 mg/dL (ref 0.55–1.02)
EGFR CKD-EPI (2021) FEMALE: >90 mL/min/{1.73_m2} (ref >=60)
GLUCOSE RANDOM: 94 mg/dL (ref 70–179)
POTASSIUM: 4.6 mmol/L (ref 3.4–4.8)
SODIUM: 146 mmol/L — ABNORMAL HIGH (ref 135–145)

## 2024-03-04 LAB — PHOSPHORUS: PHOSPHORUS: 4 mg/dL (ref 2.4–5.1)

## 2024-03-04 LAB — MAGNESIUM: MAGNESIUM: 2.1 mg/dL (ref 1.6–2.6)

## 2024-03-04 MED ADMIN — traZODone (DESYREL) tablet 150 mg: 150 mg | ORAL | @ 01:00:00

## 2024-03-04 MED ADMIN — atorvastatin (LIPITOR) tablet 80 mg: 80 mg | ORAL | @ 13:00:00

## 2024-03-04 MED ADMIN — thiamine mononitrate (vit B1) tablet 100 mg: 100 mg | ORAL | @ 13:00:00

## 2024-03-04 MED ADMIN — lurasidone (LATUDA) tablet 20 mg: 20 mg | ORAL | @ 13:00:00

## 2024-03-04 MED ADMIN — pantoprazole (Protonix) EC tablet 20 mg: 20 mg | ORAL | @ 13:00:00

## 2024-03-04 MED ADMIN — senna (SENOKOT) tablet 1 tablet: 1 | ORAL | @ 01:00:00

## 2024-03-04 MED ADMIN — mirtazapine (REMERON) tablet 30 mg: 30 mg | ORAL | @ 01:00:00

## 2024-03-04 MED ADMIN — gadopiclenol (ELUCIREM,VUEWAY) injection 4.5 mL: 4.5 mL | INTRAVENOUS | @ 21:00:00 | Stop: 2024-03-04

## 2024-03-04 MED ADMIN — QUEtiapine (SEROQUEL) tablet 12.5 mg: 12.5 mg | ORAL | @ 20:00:00

## 2024-03-04 MED ADMIN — guaiFENesin (ROBITUSSIN) oral syrup: 200 mg | ORAL | @ 23:00:00

## 2024-03-04 MED ADMIN — iohexol (OMNIPAQUE) 350 mg iodine/mL solution 90 mL: 90 mL | INTRAVENOUS | @ 22:00:00 | Stop: 2024-03-04

## 2024-03-04 NOTE — Progress Notes (Signed)
 She has a shunt which was placed in 2023.  It has not been revised.  It provided her with significant improvement in her NPH symptoms.  Recently, she saw my colleague Morna Ley with some brain fogginess.  Morna turned the shunt down from 110-70.  Over 2 weeks this did lead to a change in the CT scan with decreased size of the ventricles and some increased fluid on the outside of the brain.  It did not result in improvement in her symptoms.  She is currently hospitalized for psychiatric concerns.    We are setting her shunt back to 110, her original setting.  Given the change on the CT scan, it would appear that the shunt is still flowing.  Does not seem that her current symptoms are related to the shunt.  Once she is safely out of the hospital and symptoms are improved, we can reevaluate the shunt if needed.  No intervention is needed at this time from the neurosurgery team.    -- MGE  ____________________  Donnice Cough, MD [facility]  Department of Neurosurgery  [facility]

## 2024-03-04 NOTE — Procedures (Signed)
 VENOUS ACCESS TEAM PROCEDURE    Nurse request was placed for a PIV by Venous Access Team (VAT).  Patient was assessed at bedside for placement of a PIV. PPE were donned per protocol.  Access was obtained. Blood return noted.  Dressing intact and device well secured.  Flushed with normal saline.  See LDA for details.  Pt advised to inform RN of any s/s of discomfort at the PIV site.    Workup / Procedure Time:  30 minutes       Care RN was notified.       Thank you,     Lorelle Formosa, RN Venous Access Team

## 2024-03-04 NOTE — Consults (Signed)
 Internal Medicine Consult Service Progress Note    Assessment & Recommendations:   Christy Buchanan is a 69 y.o. 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 Pt was seen at the request of Larraine Marcos Louder, MD (Psychiatry (PSY)) for cough, SOB and fatigue.    Psychiatry (PSY) request that the medicine consult team place orders based on their recommendations: No    # Dry cough  # Shortness of breath  # Sore throat     # Fatigue, malaise  Patient remains afebrile with stable leukocytosis. Suspect symptoms most likely due to an upper respiratory infection given constellation of symptoms, lack of hypoxia or dyspnea. COVID/RSV/Flu negative.  - Albuterol  inhaler PRN  - Guaifenesin  PRN   - If patient develops a fever or hypoxia, would recommend obtaining a chest xray     # Peribronchial RLL Mass  # Right hilar, mediastinal, right anterior diaphragmatic and right internal mammary LAD  - Oncology on board, recommended CT AP w/ contrast and MRI brain w/wo contrast for staging   - Interventional pulmonology consulted, planning for bronchoscopy with EBUS   - Re-consult medicine if she needs overnight admission following bronchoscopy    Thank you for the consult. We will sign off at this time. Please re-consult if additional questions or concerns.     Interval History:   No acute events overnight. No use of PRN guaifenesin  or albuterol . Using cough drops with positive effect. Denies chest pain or sputum production.    All other systems were reviewed and are negative except as noted in the HPI    Objective:   Temp:  [36.2 ??C (97.2 ??F)-37.3 ??C (99.1 ??F)] 36.3 ??C (97.3 ??F)  Pulse:  [94-103] 103  Resp:  [16-18] 16  BP: (110-134)/(66-89) 114/89  SpO2:  [100 %] 100 %,   Intake/Output Summary (Last 24 hours) at 03/04/2024 1338  Last data filed at 03/04/2024 1215  Gross per 24 hour   Intake 1128 ml   Output --   Net 1128 ml     Gen:  NAD, thin appearing, answers questions appropriately   Eyes: Sclera anicteric, EOMI  HENT: atraumatic, normocephalic, MMM  Heart: RRR, S1, S2, no M/R/G  Lungs: CTAB, no crackles, no use of accessory muscles, expiratory wheeze of LUL  Abdomen: NTND, no rebound/guarding  Extremities: no clubbing, cyanosis, or edema: pulses are +2 in bilateral upper and lower extremities  Neuro: CN II-XI grossly intact. No focal deficits.  Skin:  No rashes, lesions on clothed exam  Psych: Alert and oriented    Labs/Studies: Labs and Studies from the last 24hrs per EMR and Reviewed     Rosina Shuck, MD  PGY-4 Internal Medicine & Pediatrics

## 2024-03-04 NOTE — Plan of Care (Signed)
 The patient is presently waiting for Transport to go to MRI and CT. PIV placed by Vascular Access Staff. The patient was encouraged to hydrate herself and acknowledged she was not drinking enough water. She denied SI/HI/AVH and was able to take her medications with no issues noted. The patient requested that staff should not discuss anything concerning around other patients and the MD informed staff. She remains A/O X4. Will continue to monitor the patient.  Problem: Fall Injury Risk  Goal: Absence of Fall and Fall-Related Injury  Outcome: Shift Focus  Intervention: Promote Injury-Free Environment  Recent Flowsheet Documentation  Taken 03/04/2024 1146 by Lynnann Vladimir PARAS, RN  Safety Interventions: low bed     Problem: Malnutrition  Goal: Improved Nutritional Intake  Outcome: Shift Focus     Problem: Infection  Goal: Absence of Infection Signs and Symptoms  Outcome: Shift Focus

## 2024-03-04 NOTE — Progress Notes (Signed)
 Psychiatry      Daily Progress Note      Admit date/time: 02/26/2024  5:00 PM   LOS: 4 days      Assessment:    Patient is a 69 y.o. 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    Diagnostic formulation:   The patient's presentation, including symptoms of low mood, difficulty sleeping, anhedonia, weight loss, difficulty concentrating, anxiety, and recent suicide attempt (via cutting wrists), appears to be most consistent with a diagnosis of major depressive disorder. Could consider bipolar disorder given reported history; however, lack of any clear hypomanic or manic episodes in her history make this less likely. Should also consider the contribution of trauma symptoms given diagnosis of PTSD, history of trauma, and reports of nightmares and hypervigilance. Must also consider contribution of psychosocial stressors, as per daughter patient's anxiety significantly worsened after receiving court summons for financial stressors in October.     Additionally, patient and daughters report sub-acute cognitive decline (confusion, inability to complete IDLs, getting lost in familiar situations) over the last year that abruptly and significantly worsened in December. She was receiving work-up for potential shunt dysfunction with her outpatient neurosurgeon, who she sees for a history of NPH, but did not complete imaging due to anxiety. Should also consider potential underlying neurocognitive disorder in addition to shunt dysfunction given MRI from 2023 with chronic small vessel ischemic changes. Will consider repeat imaging and neurocognitive testing this admission.    As of 03/04/2024, continued hospitalization is warranted given continued debilitating anxiety and depression, inability to care for self independently, complex medical conditions requiring treatment, memory difficulties, medication titration, and need for safe discharge planning. Today, patient reports continued anxiety and feeling overwhelmed given the findings of the CT scan and the multiple treatment teams.Patient does not feel hopeful of results.      Medically, patient was seen by interventional pulmonology yesterday for an consult on next steps of her lung mass. The patient was made aware of getting a minimally invasive biopsy with bronchoscopy next week. Additionally, the patient was brought papers for a MRI but was overwhelmed and declined signing. Overnight the patient also endorsed increase intensity of her cough with clear sputum. Plan for RSV, COVID, and Influenza testing in addition to a UA given symptoms and most recent elevated WBC count. Will also consult IM for guidance on further infectious work-up given her multiple complex co-morbidities and potential immunocompromised status. Patient afebrile with VSS this morning. The patient was able to consent and sign for MRI this morning after having her questions answered. Plan to obtain MRI Brain and CT Abd/Pelvis for more information on potential metastasis and staging, prior to biopsy.     Weekend Updates  1/24: RR called overnight for tempt to 99 then cancelled by nursing after normalized on recheck. Vitals and labs (CBC, BMP, mag, phos, UA) otherwise stable. Patient notes increased anxiety due to need for PIV and upcoming imaging. No changes to plan of care today.  1/25:      Diagnoses:   Active Problems:    Severe episode of recurrent major depressive disorder, without psychotic features    (CMS-HCC)    Anxiety      Plan:  Safety:  -- Continue admission to inpatient psychiatric unit for safety, stabilization, and treatment.     -- Civil Commitment Status:Involuntary    --  On unit/off unit level of supervision: q15 min checks / Restrict to Unit  -- Medical equipment: The treatment team has determined that this patient requires the continued use of medical equipment  medical bed for their care and healing. 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  -- Consult oncology, recs appreciated   -- Interventional pulmonology plan for bronchoscopy with EBUS later next week   -- CT AP w/ contrast and MRI brain w/wo contrast for staging     #Nutritional Rehabilitation  -- Start 100mg  Vitamin B1  -- BMP, Mg, Phos q24 labs to monitor for refeeding syndrome     # HLD  -- continue home 80mg  atorvastatin      #GERD  -- continue home 20mg  pantoprazole       #Constipation  -- start nightly senna per patient report     #NPH, sp shunt (2023)  --Consult neurosurgery, recs appreciated   -- NSGY adjusted shunt to 110 on 1/21     #Essential Thrombocythemia   --Consult hematology, recs appreciated   -- Defer ET medications at this time given presence of subdural hematomas   -- Discussed with hematology in light of recent lung findings, will continue to defer for the moment    -- Metabolic Monitoring:   Initial Weight: Weight: 45.5 kg (100 lb 4.8 oz)  Last Weight: Weight: 45.5 kg (100 lb 4.8 oz)  Last BMI: Body mass index is 16.69 kg/m??.  Admit BP: BP: 115/92  Last BP: BP: 114/89  Lipid Panel:   Lab Results   Component Value Date    Cholesterol, Total 152 12/08/2022    LDL Direct 53.0 08/24/2023    Cholesterol, LDL, Calculated 111 (H) 03/11/2022    Cholesterol, HDL 58 03/11/2022    Triglycerides 104 03/11/2022     Hemoglobin A1C:   Lab Results   Component Value Date    Hemoglobin A1C 5.3 05/29/2022      Fasting Blood Sugar:   Lab Results   Component Value Date    Glucose 107 (H) 12/16/2023 Social/Disposition:  -- TBD, daughters working with SW to identify alternative living situations    Patient was seen and plan of care was discussed with the Attending MD, Dr. Verita , who agrees with the above statement and plan.     Karie Sous, MD  [facility] Psychiatry PGY-3      Subjective:    Hours of sleep overnight :   7.87       Per 03/03/24 RN Epic note:   Received patient in dayroom. Patient alert and oriented, mood fair, appetite ok, sleep could be better, small BM and requested to have prn senna. Denied SI/HI/AVH. Able to make her needs known. Meds compliant. Denied offer of prn melatonin. No unsafe behavior was noted. Appeared sleeping well upon rounds. Continue care and support.  Shift Summary  Safety interventions and infection prevention were maintained, with no new injuries or unsafe behaviors reported during the shift.  Supportive and behavioral management strategies were implemented to address coping skills, with patient  remaining cooperative and calm despite persistent depressed mood.  Senna was administered for constipation, and pain remained at zero throughout the shift.  Environmental rounds and safety plan review were completed, and ambulation was promoted for VTE prevention.  Overall, patient maintained safety and participated in supportive interventions during the shift.     Absence of New-Onset Illness or Injury: No new cuts, wounds, or self-injurious behaviors were reported, and safety interventions such as environmental rounds, low bed, and locked unit precautions were maintained throughout the shift; aseptic technique and infection prevention measures were consistently applied. Pain remained at zero, and no unsafe or destructive behaviors were observed.     Optimized Coping Skills in Response to Life Stressors: Supportive measures including active listening, encouragement of self-care, relaxation techniques, and positive reinforcement were utilized, with boundaries reinforced and impulse control promoted; mood remained depressed and affect blunted, but patient was cooperative and calm with good eye contact and unimpaired cognition and concentration.    MD interview:   Still waiting to get IV. Staff have been unable to get this so far. Does not want other patients or her roommate to have to witness her getting an IV because she does not want them to hear her yelling. Is not sure she can lay still for imaging. Seroquel  is mild but it does not knock me out. Made her feel a little lightheaded, dizzy. Trying to keep her wrist covered with bandage so others do not see her scars from suicide attempt. Notes big time, gigantic anxiety because of the upcoming imaging.     Objective:    Vitals:  Vitals:    03/04/24 0758   BP: 114/89   Pulse: 103   Resp: 16   Temp: 36.3 ??C (97.3 ??F)   SpO2: 100%       Mental Status Exam:    Appearance:    Appears stated age and Malnourished   Motor:   No abnormal movements   Speech/Language:    Normal rate, volume, tone, fluency and Language intact, well formed    Mood:   Anxious and overwhelmed   Affect:   Anxious and Cooperative, tearful at times   Thought process:   Logical, linear, clear, coherent, goal directed   Thought content:     Denies SI, HI, self harm, delusions, obsessions, paranoid ideation, or ideas of reference   Perceptual disturbances:     Denies auditory and visual hallucinations, behavior not concerning for response to internal stimuli     Insight:     Fair   Judgment:    Impaired   Impulse Control:   Impaired   Other:       PE:   Gen: No acute distress, well developed and well nourished  Resp: Normal work of breathing, intermittent coughing   Neuro: no tics/tremors    Test Results:  Data Review: Lab results last 24 hours:    Recent Results (from the past 24 hours)   Urinalysis with Microscopy    Collection Time: 03/03/24  6:01 PM   Result Value Ref Range    Color, UA Colorless     Clarity, UA Clear     Specific Gravity, UA 1.007 1.003 - 1.030    pH, UA 6.0 5.0 - 9.0    Leukocyte Esterase, UA Negative Negative    Nitrite, UA Negative Negative    Protein, UA Negative Negative    Glucose, UA Negative Negative    Ketones, UA Negative Negative    Urobilinogen, UA <2.0 mg/dL <  2.0 mg/dL    Bilirubin, UA Negative Negative    Blood, UA Negative Negative    RBC, UA <1 <=4 /HPF    WBC, UA <1 0 - 5 /HPF    Squam Epithel, UA 1 0 - 5 /HPF    Bacteria, UA None Seen None Seen /HPF    Mucus, UA Rare (A) None Seen /HPF   Basic Metabolic Panel    Collection Time: 03/04/24  5:55 AM   Result Value Ref Range    Sodium 146 (H) 135 - 145 mmol/L    Potassium 4.6 3.4 - 4.8 mmol/L    Chloride 105 98 - 107 mmol/L    CO2 28.0 20.0 - 31.0 mmol/L    Anion Gap 13 5 - 14 mmol/L    BUN 16 9 - 23 mg/dL    Creatinine 9.30 9.44 - 1.02 mg/dL    BUN/Creatinine Ratio 23     eGFR CKD-EPI (2021) Female >90 >=60 mL/min/1.55m2    Glucose 94 70 - 179 mg/dL    Calcium 9.4 8.7 - 89.5 mg/dL   Magnesium Level    Collection Time: 03/04/24  5:55 AM   Result Value Ref Range    Magnesium 2.1 1.6 - 2.6 mg/dL   Phosphorus Level    Collection Time: 03/04/24  5:55 AM   Result Value Ref Range    Phosphorus 4.0 2.4 - 5.1 mg/dL   CBC w/ Differential    Collection Time: 03/04/24  5:55 AM   Result Value Ref Range    WBC 12.8 (H) 3.6 - 11.2 10*9/L    RBC 4.09 3.95 - 5.13 10*12/L    HGB 11.8 11.3 - 14.9 g/dL    HCT 64.1 65.9 - 55.9 %    MCV 87.5 77.6 - 95.7 fL    MCH 28.9 25.9 - 32.4 pg    MCHC 33.1 32.0 - 36.0 g/dL    RDW 85.8 87.7 - 84.7 %    MPV 7.8 6.8 - 10.7 fL    Platelet 596 (H) 150 - 450 10*9/L    Neutrophils % 77.6 %    Lymphocytes % 9.1 %    Monocytes % 9.8 %    Eosinophils % 2.5 %    Basophils % 1.0 %    Absolute Neutrophils 9.9 (H) 1.8 - 7.8 10*9/L    Absolute Lymphocytes 1.2 1.1 - 3.6 10*9/L    Absolute Monocytes 1.2 (H) 0.3 - 0.8 10*9/L    Absolute Eosinophils 0.3 0.0 - 0.5 10*9/L    Absolute Basophils 0.1 0.0 - 0.1 10*9/L     Imaging: Radiology report(s) reviewed.    Psychometrics:    Social Drivers of Health with Concerns     Tobacco Use: Medium Risk (03/01/2024)    Patient History     Smoking Tobacco Use: Former     Smokeless Tobacco Use: Never     Passive Exposure: Never   Physical Activity: Not on file   Stress: Not on file   Substance Use: Not on file (12/14/2022)   Social Connections: Socially Isolated (02/09/2024)    Received [facility]    Social Connection and Isolation Panel     Frequency of Communication with Friends and Family: Three times a week     Frequency of Social Gatherings with Friends and Family: Three times a week     How often do you attend church or religious services?: Never     Do you belong to any clubs or organizations such as  church groups, unions, fraternal or athletic groups, or school groups?: No     How often do you attend meetings of the clubs or organizations you belong to?: Never     Marital Status: Divorced   Physicist, Medical Strain: Patient Declined (10/15/2023)    Overall Financial Resource Strain (CARDIA)     Difficulty of Paying Living Expenses: Patient declined   Recent Concern: Financial Resource Strain - Medium Risk (09/23/2023)    Received from [facility]    Overall Financial Resource Strain (CARDIA)     Difficulty of Paying Living Expenses: Somewhat hard   Health Literacy: Not on file   Internet Connectivity: Internet connectivity concern identified (10/15/2023)    Internet Connectivity     Do you have access to internet services: No     How do you connect to the internet: Not on file     Is your internet connection strong enough for you to watch video on your device without major problems?: Not on file     Do you have enough data to get through the month?: Not on file     Does at least one of the devices have a camera that you can use for video chat?: Not on file        ---  Time-based billing disclaimer:  I personally spent 35   minutes face-to-face and non-face-to-face in the care of this patient, which includes all pre, intra, and post visit time on the date of service.  All documented time was specific to the E/M visit and does not include any procedures that may have been performed.

## 2024-03-04 NOTE — Plan of Care (Signed)
 Received patient in dayroom. Patient alert and oriented, mood fair, appetite ok, sleep could be better, small BM and requested to have prn senna. Denied SI/HI/AVH. Able to make her needs known. Meds compliant. Denied offer of prn melatonin. No unsafe behavior was noted. Appeared sleeping well upon rounds. Continue care and support.  Shift Summary  Safety interventions and infection prevention were maintained, with no new injuries or unsafe behaviors reported during the shift.  Supportive and behavioral management strategies were implemented to address coping skills, with patient remaining cooperative and calm despite persistent depressed mood.  Senna was administered for constipation, and pain remained at zero throughout the shift.  Environmental rounds and safety plan review were completed, and ambulation was promoted for VTE prevention.  Overall, patient maintained safety and participated in supportive interventions during the shift.    Absence of New-Onset Illness or Injury: No new cuts, wounds, or self-injurious behaviors were reported, and safety interventions such as environmental rounds, low bed, and locked unit precautions were maintained throughout the shift; aseptic technique and infection prevention measures were consistently applied. Pain remained at zero, and no unsafe or destructive behaviors were observed.    Optimized Coping Skills in Response to Life Stressors: Supportive measures including active listening, encouragement of self-care, relaxation techniques, and positive reinforcement were utilized, with boundaries reinforced and impulse control promoted; mood remained depressed and affect blunted, but patient was cooperative and calm with good eye contact and unimpaired cognition and concentration.

## 2024-03-05 LAB — CBC W/ AUTO DIFF
BASOPHILS ABSOLUTE COUNT: 0.1 10*9/L (ref 0.0–0.1)
BASOPHILS RELATIVE PERCENT: 0.8 %
EOSINOPHILS ABSOLUTE COUNT: 0.1 10*9/L (ref 0.0–0.5)
EOSINOPHILS RELATIVE PERCENT: 0.8 %
HEMATOCRIT: 34.8 % (ref 34.0–44.0)
HEMOGLOBIN: 11.6 g/dL (ref 11.3–14.9)
LYMPHOCYTES ABSOLUTE COUNT: 0.6 10*9/L — ABNORMAL LOW (ref 1.1–3.6)
LYMPHOCYTES RELATIVE PERCENT: 4.5 %
MEAN CORPUSCULAR HEMOGLOBIN CONC: 33.4 g/dL (ref 32.0–36.0)
MEAN CORPUSCULAR HEMOGLOBIN: 29 pg (ref 25.9–32.4)
MEAN CORPUSCULAR VOLUME: 86.8 fL (ref 77.6–95.7)
MEAN PLATELET VOLUME: 7.7 fL (ref 6.8–10.7)
MONOCYTES ABSOLUTE COUNT: 1.2 10*9/L — ABNORMAL HIGH (ref 0.3–0.8)
MONOCYTES RELATIVE PERCENT: 8.8 %
NEUTROPHILS ABSOLUTE COUNT: 11.6 10*9/L — ABNORMAL HIGH (ref 1.8–7.8)
NEUTROPHILS RELATIVE PERCENT: 85.1 %
PLATELET COUNT: 588 10*9/L — ABNORMAL HIGH (ref 150–450)
RED BLOOD CELL COUNT: 4.01 10*12/L (ref 3.95–5.13)
RED CELL DISTRIBUTION WIDTH: 13.9 % (ref 12.2–15.2)
WBC ADJUSTED: 13.6 10*9/L — ABNORMAL HIGH (ref 3.6–11.2)

## 2024-03-05 LAB — BASIC METABOLIC PANEL
ANION GAP: 14 mmol/L (ref 5–14)
BLOOD UREA NITROGEN: 16 mg/dL (ref 9–23)
BUN / CREAT RATIO: 23
CALCIUM: 9.1 mg/dL (ref 8.7–10.4)
CHLORIDE: 105 mmol/L (ref 98–107)
CO2: 26 mmol/L (ref 20.0–31.0)
CREATININE: 0.69 mg/dL (ref 0.55–1.02)
EGFR CKD-EPI (2021) FEMALE: >90 mL/min/{1.73_m2} (ref >=60)
GLUCOSE RANDOM: 115 mg/dL (ref 70–179)
POTASSIUM: 4.1 mmol/L (ref 3.4–4.8)
SODIUM: 145 mmol/L (ref 135–145)

## 2024-03-05 LAB — MAGNESIUM: MAGNESIUM: 1.9 mg/dL (ref 1.6–2.6)

## 2024-03-05 LAB — PHOSPHORUS: PHOSPHORUS: 3 mg/dL (ref 2.4–5.1)

## 2024-03-05 MED ADMIN — traZODone (DESYREL) tablet 150 mg: 150 mg | ORAL | @ 01:00:00

## 2024-03-05 MED ADMIN — atorvastatin (LIPITOR) tablet 80 mg: 80 mg | ORAL | @ 14:00:00

## 2024-03-05 MED ADMIN — thiamine mononitrate (vit B1) tablet 100 mg: 100 mg | ORAL | @ 14:00:00

## 2024-03-05 MED ADMIN — lurasidone (LATUDA) tablet 20 mg: 20 mg | ORAL | @ 14:00:00

## 2024-03-05 MED ADMIN — pantoprazole (Protonix) EC tablet 20 mg: 20 mg | ORAL | @ 14:00:00

## 2024-03-05 MED ADMIN — albuterol (PROVENTIL HFA;VENTOLIN HFA) 90 mcg/actuation inhaler 2 puff: 2 | RESPIRATORY_TRACT | @ 18:00:00

## 2024-03-05 MED ADMIN — mirtazapine (REMERON) tablet 30 mg: 30 mg | ORAL | @ 01:00:00

## 2024-03-05 NOTE — Progress Notes (Signed)
 Psychiatry      Daily Progress Note      Admit date/time: 02/26/2024  5:00 PM   LOS: 5 days      Assessment:    Patient is a 69 y.o. 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    Diagnostic formulation:   The patient's presentation, including symptoms of low mood, difficulty sleeping, anhedonia, weight loss, difficulty concentrating, anxiety, and recent suicide attempt (via cutting wrists), appears to be most consistent with a diagnosis of major depressive disorder. Could consider bipolar disorder given reported history; however, lack of any clear hypomanic or manic episodes in her history make this less likely. Should also consider the contribution of trauma symptoms given diagnosis of PTSD, history of trauma, and reports of nightmares and hypervigilance. Must also consider contribution of psychosocial stressors, as per daughter patient's anxiety significantly worsened after receiving court summons for financial stressors in October.     Additionally, patient and daughters report sub-acute cognitive decline (confusion, inability to complete IDLs, getting lost in familiar situations) over the last year that abruptly and significantly worsened in December. She was receiving work-up for potential shunt dysfunction with her outpatient neurosurgeon, who she sees for a history of NPH, but did not complete imaging due to anxiety. Should also consider potential underlying neurocognitive disorder in addition to shunt dysfunction given MRI from 2023 with chronic small vessel ischemic changes. Will consider repeat imaging and neurocognitive testing this admission.    As of 03/05/2024, continued hospitalization is warranted given continued debilitating anxiety and depression, inability to care for self independently, complex medical conditions requiring treatment, memory difficulties, medication titration, and need for safe discharge planning. Today, patient reports continued anxiety and feeling overwhelmed given the findings of the CT scan and the multiple treatment teams.Patient does not feel hopeful of results.      Medically, patient was seen by interventional pulmonology yesterday for an consult on next steps of her lung mass. The patient was made aware of getting a minimally invasive biopsy with bronchoscopy next week. Additionally, the patient was brought papers for a MRI but was overwhelmed and declined signing. Overnight the patient also endorsed increase intensity of her cough with clear sputum. Plan for RSV, COVID, and Influenza testing in addition to a UA given symptoms and most recent elevated WBC count. Will also consult IM for guidance on further infectious work-up given her multiple complex co-morbidities and potential immunocompromised status. Patient afebrile with VSS this morning. The patient was able to consent and sign for MRI this morning after having her questions answered. Plan to obtain MRI Brain and CT Abd/Pelvis for more information on potential metastasis and staging, prior to biopsy.     Weekend Updates  1/24: RR called overnight for tempt to 99 then cancelled by nursing after normalized on recheck. Vitals and labs (CBC, BMP, mag, phos, UA) otherwise stable. Patient notes increased anxiety due to need for PIV and upcoming imaging. No changes to plan of care today.  1/25: Able to get CT abdomen/pelvis and MRI brain yesterday which showed no evidence of metastatic disease. Neurosurgery evaluated patient's shunt given findings on MRI for intracranial hypotension. Endorsing some shortness of breath and encouraged to utilize albuterol  PRN. Otherwise, remains hemodynamically stable. No changes to plan of care today.      Diagnoses:   Active Problems:    Severe  episode of recurrent major depressive disorder, without psychotic features    (CMS-HCC)    Anxiety      Plan:  Safety:  -- Continue admission to inpatient psychiatric unit for safety, stabilization, and treatment.     -- Civil Commitment Status:Involuntary    -- On unit/off unit level of supervision: q15 min checks / Restrict to Unit  -- Medical equipment: The treatment team has determined that this patient requires the continued use of medical equipment  medical bed for their care and healing. 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  -- Consult oncology, recs appreciated   -- Interventional pulmonology plan for bronchoscopy with EBUS later next week   -- CT AP w/ contrast and MRI brain w/wo contrast for staging     #Nutritional Rehabilitation  -- Start 100mg  Vitamin B1  -- BMP, Mg, Phos q24 labs to monitor for refeeding syndrome     # HLD  -- continue home 80mg  atorvastatin      #GERD  -- continue home 20mg  pantoprazole       #Constipation  -- start nightly senna per patient report     #NPH, sp shunt (2023)  --Consult neurosurgery, recs appreciated   -- NSGY adjusted shunt to 110 on 1/21     #Essential Thrombocythemia   --Consult hematology, recs appreciated   -- Defer ET medications at this time given presence of subdural hematomas   -- Discussed with hematology in light of recent lung findings, will continue to defer for the moment    -- Metabolic Monitoring:   Initial Weight: Weight: 45.5 kg (100 lb 4.8 oz)  Last Weight: Weight: 45.5 kg (100 lb 4.8 oz)  Last BMI: Body mass index is 16.69 kg/m??.  Admit BP: BP: 115/92  Last BP: BP: 148/81  Lipid Panel:   Lab Results   Component Value Date    Cholesterol, Total 152 12/08/2022 LDL Direct 53.0 08/24/2023    Cholesterol, LDL, Calculated 111 (H) 03/11/2022    Cholesterol, HDL 58 03/11/2022    Triglycerides 104 03/11/2022     Hemoglobin A1C:   Lab Results   Component Value Date    Hemoglobin A1C 5.3 05/29/2022      Fasting Blood Sugar:   Lab Results   Component Value Date    Glucose 107 (H) 12/16/2023         Social/Disposition:  -- TBD, daughters working with SW to identify alternative living situations    Patient was seen and plan of care was discussed with the Attending MD, Dr. Verita , who agrees with the above statement and plan.     Karie Sous, MD  [facility] Psychiatry PGY-3      Subjective:    Hours of sleep overnight :   8       Per 03/04/24 RN Epic note:   The patient is presently waiting for Transport to go to MRI and CT. PIV placed by Vascular Access Staff. The patient was encouraged to hydrate herself and acknowledged she was not drinking enough water.  She denied SI/HI/AVH and was able to take her medications with no issues noted. The patient requested that staff should not discuss anything concerning around other patients and the MD informed staff. She remains A/O X4. Will continue to monitor the patient.     MD interview:   Says imaging went okay - was shorter than anticipated. Was hard but she was able to do it. Neurosurgery came by to look at shunt. Feels okay today. Has been coughing and having some pain in her lower back that comes and goes. Started some robitussin. Finding it hard to breathe. Has not used albuterol  yet. Hoping to get some more bandages to put over her wrist - bandage supply not readily available. No other questions or concerns today.      Objective:    Vitals:  Vitals:    03/05/24 0754   BP: 148/81   Pulse: 100   Resp: 18   Temp: 36.1 ??C (97 ??F)   SpO2: 98%       Mental Status Exam:    Appearance:    Appears stated age and Malnourished   Motor:   No abnormal movements   Speech/Language:    Normal rate, volume, tone, fluency and Language intact, well formed Mood:   Anxious and overwhelmed   Affect:   Anxious and Cooperative, tearful at times   Thought process:   Logical, linear, clear, coherent, goal directed   Thought content:     Denies SI, HI, self harm, delusions, obsessions, paranoid ideation, or ideas of reference   Perceptual disturbances:     Denies auditory and visual hallucinations, behavior not concerning for response to internal stimuli     Insight:     Fair   Judgment:    Impaired   Impulse Control:   Impaired   Other:       PE:   Gen: No acute distress, well developed and well nourished  Resp: Normal work of breathing, intermittent coughing   Neuro: no tics/tremors    Test Results:  Data Review: Lab results last 24 hours:    Recent Results (from the past 24 hours)   Basic Metabolic Panel    Collection Time: 03/05/24 10:02 AM   Result Value Ref Range    Sodium 145 135 - 145 mmol/L    Potassium 4.1 3.4 - 4.8 mmol/L    Chloride 105 98 - 107 mmol/L    CO2 26.0 20.0 - 31.0 mmol/L    Anion Gap 14 5 - 14 mmol/L    BUN 16 9 - 23 mg/dL    Creatinine 9.30 9.44 - 1.02 mg/dL    BUN/Creatinine Ratio 23     eGFR CKD-EPI (2021) Female >90 >=60 mL/min/1.75m2    Glucose 115 70 - 179 mg/dL    Calcium 9.1 8.7 - 89.5 mg/dL   Magnesium Level    Collection Time: 03/05/24 10:02 AM   Result Value Ref Range    Magnesium 1.9 1.6 - 2.6 mg/dL   Phosphorus Level    Collection Time: 03/05/24 10:02 AM   Result Value Ref Range    Phosphorus 3.0 2.4 - 5.1 mg/dL   CBC w/ Differential    Collection Time: 03/05/24 10:02 AM   Result Value Ref Range    WBC 13.6 (H) 3.6 - 11.2 10*9/L    RBC 4.01 3.95 - 5.13 10*12/L    HGB 11.6 11.3 - 14.9 g/dL    HCT 65.1 65.9 - 55.9 %    MCV 86.8 77.6 -  95.7 fL    MCH 29.0 25.9 - 32.4 pg    MCHC 33.4 32.0 - 36.0 g/dL    RDW 86.0 87.7 - 84.7 %    MPV 7.7 6.8 - 10.7 fL    Platelet 588 (H) 150 - 450 10*9/L    Neutrophils % 85.1 %    Lymphocytes % 4.5 %    Monocytes % 8.8 %    Eosinophils % 0.8 %    Basophils % 0.8 %    Absolute Neutrophils 11.6 (H) 1.8 - 7.8 10*9/L    Absolute Lymphocytes 0.6 (L) 1.1 - 3.6 10*9/L    Absolute Monocytes 1.2 (H) 0.3 - 0.8 10*9/L    Absolute Eosinophils 0.1 0.0 - 0.5 10*9/L    Absolute Basophils 0.1 0.0 - 0.1 10*9/L       Imaging: Radiology report(s) reviewed.    Psychometrics:    Social Drivers of Health with Concerns     Tobacco Use: Medium Risk (03/01/2024)    Patient History     Smoking Tobacco Use: Former     Smokeless Tobacco Use: Never     Passive Exposure: Never   Physical Activity: Not on file   Stress: Not on file   Substance Use: Not on file (12/14/2022)   Social Connections: Socially Isolated (02/09/2024)    Received from [facility]    Social Connection and Isolation Panel     Frequency of Communication with Friends and Family: Three times a week     Frequency of Social Gatherings with Friends and Family: Three times a week     How often do you attend church or religious services?: Never     Do you belong to any clubs or organizations such as church groups, unions, fraternal or athletic groups, or school groups?: No     How often do you attend meetings of the clubs or organizations you belong to?: Never     Marital Status: Divorced   Physicist, Medical Strain: Patient Declined (10/15/2023)    Overall Financial Resource Strain (CARDIA)     Difficulty of Paying Living Expenses: Patient declined   Recent Concern: Physicist, Medical Strain - Medium Risk (09/23/2023)    Received from [facility]    Overall Financial Resource Strain (CARDIA)     Difficulty of Paying Living Expenses: Somewhat hard   Health Literacy: Not on file   Internet Connectivity: Internet connectivity concern identified (10/15/2023)    Internet Connectivity     Do you have access to internet services: No     How do you connect to the internet: Not on file     Is your internet connection strong enough for you to watch video on your device without major problems?: Not on file     Do you have enough data to get through the month?: Not on file     Does at least one of the devices have a camera that you can use for video chat?: Not on file        ---  Time-based billing disclaimer:  I personally spent 30   minutes face-to-face and non-face-to-face in the care of this patient, which includes all pre, intra, and post visit time on the date of service.  All documented time was specific to the E/M visit and does not include any procedures that may have been performed.

## 2024-03-05 NOTE — Procedures (Cosign Needed)
 Interrogation     Performing Service: Neurosurgery  Patient Location: [phone]     Interrogation     Indications: Recent MRI scan     Procedure:  Interrogation     Shunt Type: Sophysa  Documented Previous Setting: 110  Actual Setting on Interrogation: 110  New Setting: NA.    Complications: None    Specimens: None

## 2024-03-05 NOTE — Plan of Care (Signed)
 Shift Summary  Received patient in room. Patient alert and oriented, calm and cooperative. Meds compliant. PIV was removed per patient request. Denied SI/HI/AVH. Depressed mood, poor appetite, encourage PO drinking. Had BM. ADLs independent. Continue care and support.    No new injuries or self-harm behaviors occurred, and infection prevention protocols were maintained throughout the shift.   Supportive interventions were provided, including active listening, emotional support, and encouragement of self-care and relaxation techniques.   Safety was reinforced with environmental controls such as a low bed and locked unit, and ambulation was promoted for VTE prevention.   Mood and affect remained depressed and blunted, but patient was cooperative and calm during interactions with staff.   Overall, patient remained safe and engaged with staff, with no new-onset illness or injury noted during the shift.    Absence of New-Onset Illness or Injury: No new cuts or wounds were noted, and self-injurious or unsafe behaviors were denied; infection prevention and aseptic technique were maintained throughout the shift, with safety interventions such as a low bed and locked unit in place.    Optimized Coping Skills in Response to Life Stressors: Supportive measures including active listening, encouragement of self-care, and promotion of relaxation techniques were utilized, with emotional support and decision-making assistance provided; mood remained depressed and affect blunted, but patient was cooperative and calm.

## 2024-03-05 NOTE — Plan of Care (Signed)
 The patient was seen by a Neurosurgeon and her shunt was checked. She was able to take her medications. The patient has been visible in the milieu and interacted well with roommate and peers. She denied SI/HI/AVH. She ate her meals and remains calm and redirectable. Staff will continue to monitor the patient.  Problem: Adult Behavioral Health Plan of Care  Goal: Optimized Coping Skills in Response to Life Stressors  Outcome: Shift Focus     Problem: Depression  Goal: LTG: Christy Buchanan will demonstrate decreased symptoms of depression  Outcome: Shift Focus     Problem: Adult Behavioral Health Plan of Care  Goal: Absence of New-Onset Illness or Injury  Intervention: Prevent Infection  Recent Flowsheet Documentation  Taken 03/05/2024 1017 by Lynnann Vladimir PARAS, RN  Infection Prevention: hand hygiene promoted     Problem: Adult Behavioral Health Plan of Care  Goal: Absence of New-Onset Illness or Injury  Intervention: Prevent Infection  Recent Flowsheet Documentation  Taken 03/05/2024 1017 by Lynnann Vladimir PARAS, RN  Infection Prevention: hand hygiene promoted

## 2024-03-06 LAB — CBC W/ AUTO DIFF
BASOPHILS ABSOLUTE COUNT: 0.1 10*9/L (ref 0.0–0.1)
BASOPHILS RELATIVE PERCENT: 0.9 %
EOSINOPHILS ABSOLUTE COUNT: 0.1 10*9/L (ref 0.0–0.5)
EOSINOPHILS RELATIVE PERCENT: 1.2 %
HEMATOCRIT: 33.4 % — ABNORMAL LOW (ref 34.0–44.0)
HEMOGLOBIN: 11.5 g/dL (ref 11.3–14.9)
LYMPHOCYTES ABSOLUTE COUNT: 0.7 10*9/L — ABNORMAL LOW (ref 1.1–3.6)
LYMPHOCYTES RELATIVE PERCENT: 5.6 %
MEAN CORPUSCULAR HEMOGLOBIN CONC: 34.4 g/dL (ref 32.0–36.0)
MEAN CORPUSCULAR HEMOGLOBIN: 29.8 pg (ref 25.9–32.4)
MEAN CORPUSCULAR VOLUME: 86.5 fL (ref 77.6–95.7)
MEAN PLATELET VOLUME: 7.5 fL (ref 6.8–10.7)
MONOCYTES ABSOLUTE COUNT: 1 10*9/L — ABNORMAL HIGH (ref 0.3–0.8)
MONOCYTES RELATIVE PERCENT: 8 %
NEUTROPHILS ABSOLUTE COUNT: 10.1 10*9/L — ABNORMAL HIGH (ref 1.8–7.8)
NEUTROPHILS RELATIVE PERCENT: 84.3 %
PLATELET COUNT: 533 10*9/L — ABNORMAL HIGH (ref 150–450)
RED BLOOD CELL COUNT: 3.87 10*12/L — ABNORMAL LOW (ref 3.95–5.13)
RED CELL DISTRIBUTION WIDTH: 14.1 % (ref 12.2–15.2)
WBC ADJUSTED: 12 10*9/L — ABNORMAL HIGH (ref 3.6–11.2)

## 2024-03-06 LAB — BASIC METABOLIC PANEL
ANION GAP: 13 mmol/L (ref 5–14)
BLOOD UREA NITROGEN: 16 mg/dL (ref 9–23)
BUN / CREAT RATIO: 24
CALCIUM: 9 mg/dL (ref 8.7–10.4)
CHLORIDE: 103 mmol/L (ref 98–107)
CO2: 26 mmol/L (ref 20.0–31.0)
CREATININE: 0.68 mg/dL (ref 0.55–1.02)
EGFR CKD-EPI (2021) FEMALE: >90 mL/min/{1.73_m2} (ref >=60)
GLUCOSE RANDOM: 163 mg/dL (ref 70–179)
POTASSIUM: 4.1 mmol/L (ref 3.4–4.8)
SODIUM: 142 mmol/L (ref 135–145)

## 2024-03-06 LAB — PHOSPHORUS: PHOSPHORUS: 2.8 mg/dL (ref 2.4–5.1)

## 2024-03-06 LAB — MAGNESIUM: MAGNESIUM: 2 mg/dL (ref 1.6–2.6)

## 2024-03-06 MED ADMIN — traZODone (DESYREL) tablet 150 mg: 150 mg | ORAL | @ 01:00:00

## 2024-03-06 MED ADMIN — atorvastatin (LIPITOR) tablet 80 mg: 80 mg | ORAL | @ 14:00:00

## 2024-03-06 MED ADMIN — thiamine mononitrate (vit B1) tablet 100 mg: 100 mg | ORAL | @ 14:00:00

## 2024-03-06 MED ADMIN — lurasidone (LATUDA) tablet 20 mg: 20 mg | ORAL | @ 14:00:00

## 2024-03-06 MED ADMIN — pantoprazole (Protonix) EC tablet 20 mg: 20 mg | ORAL | @ 14:00:00

## 2024-03-06 MED ADMIN — albuterol (PROVENTIL HFA;VENTOLIN HFA) 90 mcg/actuation inhaler 2 puff: 2 | RESPIRATORY_TRACT | @ 10:00:00

## 2024-03-06 MED ADMIN — albuterol (PROVENTIL HFA;VENTOLIN HFA) 90 mcg/actuation inhaler 2 puff: 2 | RESPIRATORY_TRACT | @ 23:00:00

## 2024-03-06 MED ADMIN — albuterol (PROVENTIL HFA;VENTOLIN HFA) 90 mcg/actuation inhaler 2 puff: 2 | RESPIRATORY_TRACT | @ 18:00:00

## 2024-03-06 MED ADMIN — albuterol (PROVENTIL HFA;VENTOLIN HFA) 90 mcg/actuation inhaler 2 puff: 2 | RESPIRATORY_TRACT | @ 01:00:00

## 2024-03-06 MED ADMIN — senna (SENOKOT) tablet 1 tablet: 1 | ORAL | @ 02:00:00

## 2024-03-06 MED ADMIN — mirtazapine (REMERON) tablet 30 mg: 30 mg | ORAL | @ 01:00:00

## 2024-03-06 NOTE — Consults (Addendum)
 Adult Nutrition Assessment, Progress Note    Visit Type: MD Consult, Follow-Up  Reason for Visit: Assessment (Nutrition)    NUTRITION INTERVENTIONS and RECOMMENDATION     Encourage and monitor PO intake and record % consumed in Epic.  Please discuss meal orders with pt once normal meal operations begin (facility currently operating with snow menu due to recent inclement weather).   Allow menu choices in line with preferences to further encourage po intake.  Adjust oral supplement to Ensure Plus to allow for vanilla flavor preference; increase frequency to TID.   Monitor for BM; no documented BM.   Weight 2-3x weekly to track progress     NUTRITION ASSESSMENT     Current nutrition therapy is appropriate and PO intake likely meeting nutritional needs at this time based on nurse report and documented PO intakes. Pt needs may be higher than current estimates.  Patient would benefit from continuing oral supplement to optimize weight given underweight/low BMI.   MD noted continued wt loss despite encouragement; pt may benefit from additional oral supplement at this time.   Discussed increased frequency/adjusted flavors in line with preferences  Pt may also be open to milkshakes if further wt loss persists; would like to continue with protein drinks for now.  Pt food choices limited by snow menu at this time in the setting of recent inclement weather. Pt food preferences include: peanut butter sandwiches, yogurt, beans, fish (not cat fish).   Pt would benefit from assistance with meal ordering in line with preferences as normal operations resume.      NUTRITIONALLY RELEVANT DATA     HPI & PMH:   Per provider note: Christy Buchanan is a 69 y.o. female with a history of self-reported bipolar II, anxiety, MDD, and multiple complex medical illnesses, being admitted to [facility] inpatient service. They have been referred for admission to [facility] inpatient psychiatric unit for worsening depression and poor self-care. Nutrition History:   Patient unavailable due to working with providers. Per chart, patient reported having a decreased appetite since moving from [place]  and not liking the food here. Patient endorsed trying to consume yogurt, fruits, and chocolate Ensure-like drink for breakfast; lunch that usually consisted of more yogurt and some soup; patient did not share what eats for dinner, just stated she does not eat meat. Per daughter, patient with 2 months of worsened panic and anxiety; since early December, patient not eating. Patient with 20-lb weight loss over the past year; patient has been trying to prevent weight loss and has been forcing herself to eat. RN endorses patient currently eating well. Patient consumed 100% of breakfast today. Documented PO intakes of 75-100% of meals (one 50%) plus snack.     Nutrition Progress: Documented intakes suggest consistently eating 75-100% of meals. If intake is documented accurately, pt is meeting estimated needs. Continues to lose weight. Without documented BM at this time. Pt reports meals delivered but unable to make meal selections at this time. She stated food preferences like peanut butter sandwiches, yogurt, beans, fish (not cat fish). Discussed limited options at this time due to snow menu. She is agreeable to continuing oral supplements but would prefer vanilla flavors. She may be open to additional scheduled snacks like milkshakes but would like to continue with protein drinks in line with flavor preferences for now.       Medications:  Nutritionally pertinent medications reviewed and evaluated for potential food and/or medication interactions.     Scheduled Meds: atorvastatin , mirtazapine , thiamine   Labs:   Nutritionally pertinent labs reviewed.     Nutritional Needs:   Healthy balance of carbohydrate, protein, and fat.   Increased needs to promote weight gain given low BMI     ASPEN: 1118-1341 kcal (25-30 kcal/kg current body weight 44.7 kg); 35-54 g protein/day (0.8-1.2 g/kg current body weight 44.7 kg).     Anthropometric Data:  Height: 165.1 cm (5' 5)   Admission weight: 45.5 kg (100 lb 4.8 oz)  Last recorded weight: 44.7 kg (98 lb 8 oz) (03/05/24)  IBW: 56.75 kg  BMI: Body mass index is 16.39 kg/m??.   Usual Body Weight: Unable to obtain at this time   Weight Assessment: -6.2 kg (12%) between 10/28/23-02/29/24 (4 months, 2 days) - significant     Wt Readings from Last 10 Encounters:   03/05/24 44.7 kg (98 lb 8 oz)   03/03/24 45.4 kg (100 lb)   02/24/24 45.4 kg (100 lb)   01/31/24 46.1 kg (101 lb 9.6 oz)   01/03/24 46.9 kg (103 lb 6.4 oz)   11/23/23 49.4 kg (108 lb 14.5 oz)   10/28/23 51.7 kg (114 lb)   08/24/23 51.7 kg (113 lb 15.7 oz)   05/25/23 53.4 kg (117 lb 11.6 oz)   04/27/23 52 kg (114 lb 11.2 oz)        Weight changes this admission:   Last 5 Recorded Weights    02/29/24 1009 03/05/24 1952   Weight: 45.5 kg (100 lb 4.8 oz) 44.7 kg (98 lb 8 oz)        Malnutrition Assessment:  Malnutrition Assessment using AND/ASPEN or GLIM Clinical Characteristics:            GLIM Severe Malnutrition (03/01/24 1549)  Involuntary Weight Loss: >10% in 6 months  Low Body Mass Index: <18.5 if < 70 years  Reduced Food Intake: Less than or equal 50% of requirement > 1 week    Nutrition Focused Physical Exam:  Unable to complete at this time due to patient with providers    Care plan:  Completed    Current Nutrition:  Oral intake   Nutrition Orders            Nutrition Therapy Regular/House starting at 01/22 1614    Supplement Adult; Ensure Plus High Protein (High Calorie/High Protein); # of Products PER Serving: 1 2xd Meals starting at 01/21 1700          Nutritionally Pertinent Allergies, Intolerances, Sensitivities, and/or Cultural/Religious Restrictions:  none identified per chart review at this time     GOALS and EVALUATION     Patient to meet 75% or greater of nutritional needs via combination of meals, snacks, and/or oral supplements within admission.  - New and Meeting/Ongoing    Motivation, Barriers, and Compliance:  Evaluation of motivation, barriers, and compliance completed. No concerns identified at this time.     Discharge Planning:   Monitor for potential discharge needs with multi-disciplinary team.     Follow-Up Parameters:   1-2 times per week (and more frequent as indicated)      Therisa Lyme Ph.D, RD, LDN  Per Diem Clinical Dietitian

## 2024-03-06 NOTE — Progress Notes (Signed)
 Psychiatry      Daily Progress Note      Admit date/time: 02/26/2024  5:00 PM   LOS: 6 days      Assessment:    Patient is a 69 y.o. 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    Diagnostic formulation:   The patient's presentation, including symptoms of low mood, difficulty sleeping, anhedonia, weight loss, difficulty concentrating, anxiety, and recent suicide attempt (via cutting wrists), appears to be most consistent with a diagnosis of major depressive disorder. Could consider bipolar disorder given reported history; however, lack of any clear hypomanic or manic episodes in her history make this less likely. Should also consider the contribution of trauma symptoms given diagnosis of PTSD, history of trauma, and reports of nightmares and hypervigilance. Must also consider contribution of psychosocial stressors, as per daughter patient's anxiety significantly worsened after receiving court summons for financial stressors in October.     Additionally, patient and daughters report sub-acute cognitive decline (confusion, inability to complete IDLs, getting lost in familiar situations) over the last year that abruptly and significantly worsened in December. She was receiving work-up for potential shunt dysfunction with her outpatient neurosurgeon, who she sees for a history of NPH, but did not complete imaging due to anxiety. Should also consider potential underlying neurocognitive disorder in addition to shunt dysfunction given MRI from 2023 with chronic small vessel ischemic changes.     As of 03/06/2024, continued hospitalization is warranted given continued debilitating anxiety and depression, inability to care for self independently, complex medical conditions requiring treatment, memory difficulties, medication titration, and need for safe discharge planning. Patient obtained work-up for newly found lung masses over the weekend. MRI Brain with findings suggestive of intracranial hypotension, seen by neurosurgery who adjusted shunt. Also findings suggestive of small vessel ischemic changes; no masses seen. CT Abd/Pelvis also without concern for metastatic disease. Plan for bronchoscopy with EBUS this week per interventional pulmonology for further work-up Pt continues to be anxious about physical health , monitoring po intake as pt is losing weight despite intake , will have nutrition see pt for recs         Diagnoses:   Active Problems:    Severe episode of recurrent major depressive disorder, without psychotic features    (CMS-HCC)    Anxiety      Plan:  Safety:  -- Continue admission to inpatient psychiatric unit for safety, stabilization, and treatment.     -- Civil Commitment Status:Involuntary    -- On unit/off unit level of supervision: q15 min checks / Restrict to Unit  -- Medical equipment: The treatment team has determined that this patient requires the continued use of medical equipment  medical bed for their care and healing. Specific medical rationale for need for equipment: advanced age (88 or above),        Psychiatry:  # MDD  -- Latuda  20 mg daily  -- Mirtazapine  30 mg nightly  -- Trazodone  150 mg nightly     # Self-Inflicted Wrist Wound   -- Consult wound nurse   -- Wound care evaluated lesion 1/21     # Therapy Interventions:  -- RT, OT, Milieu therapy    Medical:    #Lung mass w/ possible metastasis  -- Consult oncology, recs appreciated   -- Interventional pulmonology plan for bronchoscopy with EBUS   --  MRI Brain and CT Abd/Pelvic without signs of mets    # c/f Pancreatic cysts   -- CT Abd with numerous lesions in pancrease concerning for pancreatic cysts   -- Will need outpatient MRI abd w wo after discharge for further work-up    #Endometrial thickening   -- CT Pelvis showed prominent endometrium measuring up to 1.3 cm   -- Will need outpatient endovaginal ultrasound for further work-up    #Nutritional Rehabilitation  -- Start 100mg  Vitamin B1  -- BMP, Mg, Phos q24 labs to monitor for refeeding syndrome  -- asked nutrition to see pt again as she is still losing weight despite intake  , will follow up on recs      # HLD  -- continue home 80mg  atorvastatin      #GERD  -- continue home 20mg  pantoprazole       #Constipation  -- start nightly senna per patient report     #NPH, sp shunt (2023)  --Consult neurosurgery, recs appreciated   -- NSGY adjusted shunt to 110 on 1/21     #Essential Thrombocythemia   --Consult hematology, recs appreciated   -- Defer ET medications at this time given presence of subdural hematomas   -- Discussed with hematology in light of recent lung findings, will continue to defer for the moment    -- Metabolic Monitoring:   Initial Weight: Weight: 45.5 kg (100 lb 4.8 oz)  Last Weight: Weight: 44.7 kg (98 lb 8 oz)  Last BMI: Body mass index is 16.39 kg/m??.  Admit BP: BP: 115/92  Last BP: BP: 147/80  Lipid Panel:   Lab Results   Component Value Date    Cholesterol, Total 152 12/08/2022    LDL Direct 53.0 08/24/2023    Cholesterol, LDL, Calculated 111 (H) 03/11/2022    Cholesterol, HDL 58 03/11/2022    Triglycerides 104 03/11/2022     Hemoglobin A1C:   Lab Results   Component Value Date    Hemoglobin A1C 5.3 05/29/2022      Fasting Blood Sugar:   Lab Results   Component Value Date    Glucose 107 (H) 12/16/2023         Social/Disposition:  -- TBD, daughters working with SW to identify alternative living situations          Subjective:    Hours of sleep overnight :   8       Per 03/05/24 RN Epic note:   The patient was seen by a Neurosurgeon and her shunt was checked. She was able to take her medications. The patient has been visible in the milieu and interacted well with roommate and peers. She denied SI/HI/AVH. She ate her meals and remains calm and redirectable. Staff will continue to monitor the patient.     MD interview:   Pt seen in interview room. She noted she has been somewhat anxious with the workup of her cancer. We discussed that the new scans didn't show mets per the radiologist. She then asked me if she still had cancer and we discussed that the mass in her lung was still likely cancer but we will know more after the biopsy planned for this week. She noted that she is sleeping ok. She reported that she wants to eat more protein and has been drinking the meal supplements , but the menu is limited here. We dicussed we could have nutrition see her again and follow their recommendations.       Objective:    Vitals:  Vitals:    03/05/24 1952   BP: 147/80   Pulse: 102   Resp: 16   Temp: 35.5 ??C (95.9 ??F)   SpO2: 100%       Mental Status Exam:    Appearance:    Appears stated age and Malnourished   Motor:   No abnormal movements   Speech/Language:    Normal rate, volume, tone, fluency and Language intact, well formed    Mood:   Worried    Affect:   Anxious and Cooperative   Thought process:   Logical, linear, clear, coherent, goal directed   Thought content:     Denies SI, HI, self harm, delusions, obsessions, paranoid ideation, or ideas of reference   Perceptual disturbances:     Denies auditory and visual hallucinations, behavior not concerning for response to internal stimuli     Insight:     Fair   Judgment:    Impaired   Impulse Control:   Impaired   Other:       PE:   Gen: No acute distress, well developed and well nourished  Resp: Normal work of breathing, intermittent coughing   Neuro: no tics/tremors    Test Results:  Data Review: Lab results last 24 hours:    Recent Results (from the past 24 hours)   Basic Metabolic Panel    Collection Time: 03/05/24 10:02 AM   Result Value Ref Range    Sodium 145 135 - 145 mmol/L    Potassium 4.1 3.4 - 4.8 mmol/L    Chloride 105 98 - 107 mmol/L    CO2 26.0 20.0 - 31.0 mmol/L    Anion Gap 14 5 - 14 mmol/L    BUN 16 9 - 23 mg/dL    Creatinine 9.30 9.44 - 1.02 mg/dL BUN/Creatinine Ratio 23     eGFR CKD-EPI (2021) Female >90 >=60 mL/min/1.74m2    Glucose 115 70 - 179 mg/dL    Calcium 9.1 8.7 - 89.5 mg/dL   Magnesium Level    Collection Time: 03/05/24 10:02 AM   Result Value Ref Range    Magnesium 1.9 1.6 - 2.6 mg/dL   Phosphorus Level    Collection Time: 03/05/24 10:02 AM   Result Value Ref Range    Phosphorus 3.0 2.4 - 5.1 mg/dL   CBC w/ Differential    Collection Time: 03/05/24 10:02 AM   Result Value Ref Range    WBC 13.6 (H) 3.6 - 11.2 10*9/L    RBC 4.01 3.95 - 5.13 10*12/L    HGB 11.6 11.3 - 14.9 g/dL    HCT 65.1 65.9 - 55.9 %    MCV 86.8 77.6 - 95.7 fL    MCH 29.0 25.9 - 32.4 pg    MCHC 33.4 32.0 - 36.0 g/dL    RDW 86.0 87.7 - 84.7 %    MPV 7.7 6.8 - 10.7 fL    Platelet 588 (H) 150 - 450 10*9/L    Neutrophils % 85.1 %    Lymphocytes % 4.5 %    Monocytes % 8.8 %    Eosinophils % 0.8 %    Basophils % 0.8 %    Absolute Neutrophils 11.6 (H) 1.8 - 7.8 10*9/L    Absolute Lymphocytes 0.6 (L) 1.1 - 3.6 10*9/L    Absolute Monocytes 1.2 (H) 0.3 - 0.8 10*9/L    Absolute Eosinophils 0.1 0.0 - 0.5 10*9/L    Absolute Basophils 0.1 0.0 - 0.1 10*9/L       Imaging:  Radiology report(s) reviewed.    Psychometrics:    Social Drivers of Health with Concerns     Tobacco Use: Medium Risk (03/01/2024)    Patient History     Smoking Tobacco Use: Former     Smokeless Tobacco Use: Never     Passive Exposure: Never   Physical Activity: Not on file   Stress: Not on file   Substance Use: Not on file (12/14/2022)   Social Connections: Socially Isolated (02/09/2024)    Received from [facility]    Social Connection and Isolation Panel     Frequency of Communication with Friends and Family: Three times a week     Frequency of Social Gatherings with Friends and Family: Three times a week     How often do you attend church or religious services?: Never     Do you belong to any clubs or organizations such as church groups, unions, fraternal or athletic groups, or school groups?: No     How often do you attend meetings of the clubs or organizations you belong to?: Never     Marital Status: Divorced   Physicist, Medical Strain: Patient Declined (10/15/2023)    Overall Financial Resource Strain (CARDIA)     Difficulty of Paying Living Expenses: Patient declined   Recent Concern: Physicist, Medical Strain - Medium Risk (09/23/2023)    Received from [facility]    Overall Financial Resource Strain (CARDIA)     Difficulty of Paying Living Expenses: Somewhat hard   Health Literacy: Not on file   Internet Connectivity: Internet connectivity concern identified (10/15/2023)    Internet Connectivity     Do you have access to internet services: No     How do you connect to the internet: Not on file     Is your internet connection strong enough for you to watch video on your device without major problems?: Not on file     Do you have enough data to get through the month?: Not on file     Does at least one of the devices have a camera that you can use for video chat?: Not on file        ---  Time-based billing disclaimer:     I have seen, interviewed and evaluated the patient, participating in the key portions of the service.  Larraine Louder MD    I personally spent 35 minutes face-to-face and non-face-to-face in the care of this patient, which includes all pre, intra, and post visit E/M time on the date of service. All documented time was specific to the E/M visit and does not include any pre, intra, post procedure related time.

## 2024-03-06 NOTE — Plan of Care (Signed)
 [facility]  Psychiatry  Court Statement       Commitment Type: Involuntary Adult    Re: Christy Buchanan    Date: March 06, 2024    Description of symptoms and behaviors supporting finding of mental illness/substance abuse (include pre-admission history and observation since admission): The patient's presentation, including symptoms of low mood, difficulty sleeping, anhedonia, weight loss, difficulty concentrating, anxiety, and recent suicide attempt (via cutting wrists), appears to be most consistent with a diagnosis of major depressive disorder. Could consider bipolar disorder given reported history; however, lack of any clear hypomanic or manic episodes in her history make this less likely. Should also consider the contribution of trauma symptoms given diagnosis of PTSD, history of trauma, and reports of nightmares and hypervigilance. Must also consider contribution of psychosocial stressors, as per daughter patient's anxiety significantly worsened after receiving court summons for financial stressors in October.     Additionally, patient and daughters report sub-acute cognitive decline (confusion, inability to complete IDLs, getting lost in familiar situations) over the last year that abruptly and significantly worsened in December. She was receiving work-up for potential shunt dysfunction with her outpatient neurosurgeon, who she sees for a history of NPH, but did not complete imaging due to anxiety. Should also consider potential underlying neurocognitive disorder in addition to shunt dysfunction given MRI from 2023 with chronic small vessel ischemic changes.      Diagnoses: Major Depressive Disorder    Need for further inpatient treatment (include description of specific behaviors and symptoms supporting finding that further inpatient treatment is needed): Continued hospitalization is warranted at this time given continued debilitating anxiety and depression, inability to care for self independently, complex medical conditions requiring treatment, memory difficulties, medication titration, and need for safe discharge planning.      Recommendation: 14 days Inpatient    Christy [name] Cong, MD

## 2024-03-06 NOTE — Plan of Care (Signed)
 Shift Summary  Patient was withdrawal to room. Alert and oriented with some short term memory loss. Reported mood ok, appetite as usual, sleep good, had a small BM. Denied SI/HI/AVH. Meds compliant. Calm and cooperative. No unsafe behavior was noted. Continue care and support.    Albuterol  was administered PRN for wheezing and senna was given PRN for constipation.   Safety rounds were completed, and no new self-harm or unsafe behaviors were reported.   Supportive measures focused on coping skills and engagement were implemented.   Nutrition interventions included providing food preferences and monitoring intake.   Overall, the patient remained medically stable with no new injuries or illnesses during the shift.     Absence of New-Onset Illness or Injury: No new cuts, wounds, or self-injurious behaviors were reported, and safety interventions such as safety rounds and a locked unit were maintained throughout the shift. Aseptic technique and hand hygiene were consistently practiced, and no unsafe or destructive behaviors were observed.     Optimized Coping Skills in Response to Life Stressors: Active listening, relaxation techniques, and positive reinforcement were utilized, with staff supporting decision-making and encouraging verbalization of feelings; mood was noted as depressed but calm, and thought processes remained coherent.     Improved Nutritional Intake: Nutrition was assessed as adequate, food preferences were provided, and the patient was able to feed herself; weight remained stable with a slight decrease from the previous measurement.

## 2024-03-06 NOTE — Plan of Care (Signed)
 Shift Summary  Safety interventions and behavioral plan were maintained, and no self-injurious or destructive behaviors were observed during the shift.   Patient participated in a group activity and demonstrated calm and cooperative behavior.   Albuterol  was administered PRN.   No new wounds or elopement risk were noted.   Patient remained safe and engaged throughout the shift.     Adheres to Safety Considerations for Self and Others: No self-injurious or destructive behaviors were reported, and safety interventions such as fall reduction and environmental modifications were maintained throughout the shift; patient remained calm, cooperative, and engaged in group activities without concerns noted.

## 2024-03-06 NOTE — Plan of Care (Signed)
 Shift Summary  Alert and oriented x 4. Pt says they are in a pleasant mood. No thoughts of self harm or harm to others. No auditory or visual hallucinations.  Safety interventions and behavioral plan review were maintained, with no self-injurious or unsafe behaviors documented.   Fall reduction strategies and mobility support were consistently implemented, with no falls or injuries reported.   Nutritional status remained excellent throughout the shift.   Infection prevention measures were upheld, and no infection-related symptoms were documented.   Overall, the environment remained safe and patient interactions were calm and cooperative.     Adheres to Safety Considerations for Self and Others: No self-injurious or unsafe behaviors were documented, and safety interventions such as behavioral plan review and structured redirection were maintained throughout the shift. Calm and cooperative interpersonal interactions were observed, and the environment remained safe.     Absence of Fall and Fall-Related Injury: No falls or fall-related injuries occurred, and fall reduction strategies including hourly visual checks and appropriate use of side rails were maintained. Mobility remained steady and coordinated, with no limitations noted.     Improved Nutritional Intake: Excellent nutritional status was consistently documented during the shift.     Absence of Infection Signs and Symptoms: Aseptic technique and hand hygiene were promoted, and no symptoms related to infection were documented.

## 2024-03-07 LAB — CBC W/ AUTO DIFF
BASOPHILS ABSOLUTE COUNT: 0.1 10*9/L (ref 0.0–0.1)
BASOPHILS RELATIVE PERCENT: 0.6 %
EOSINOPHILS ABSOLUTE COUNT: 0.1 10*9/L (ref 0.0–0.5)
EOSINOPHILS RELATIVE PERCENT: 1 %
HEMATOCRIT: 32.7 % — ABNORMAL LOW (ref 34.0–44.0)
HEMOGLOBIN: 11.1 g/dL — ABNORMAL LOW (ref 11.3–14.9)
LYMPHOCYTES ABSOLUTE COUNT: 0.6 10*9/L — ABNORMAL LOW (ref 1.1–3.6)
LYMPHOCYTES RELATIVE PERCENT: 4.4 %
MEAN CORPUSCULAR HEMOGLOBIN CONC: 34 g/dL (ref 32.0–36.0)
MEAN CORPUSCULAR HEMOGLOBIN: 29.7 pg (ref 25.9–32.4)
MEAN CORPUSCULAR VOLUME: 87.3 fL (ref 77.6–95.7)
MEAN PLATELET VOLUME: 7.7 fL (ref 6.8–10.7)
MONOCYTES ABSOLUTE COUNT: 1.3 10*9/L — ABNORMAL HIGH (ref 0.3–0.8)
MONOCYTES RELATIVE PERCENT: 9.7 %
NEUTROPHILS ABSOLUTE COUNT: 11.4 10*9/L — ABNORMAL HIGH (ref 1.8–7.8)
NEUTROPHILS RELATIVE PERCENT: 84.3 %
PLATELET COUNT: 455 10*9/L — ABNORMAL HIGH (ref 150–450)
RED BLOOD CELL COUNT: 3.75 10*12/L — ABNORMAL LOW (ref 3.95–5.13)
RED CELL DISTRIBUTION WIDTH: 14.2 % (ref 12.2–15.2)
WBC ADJUSTED: 13.6 10*9/L — ABNORMAL HIGH (ref 3.6–11.2)

## 2024-03-07 LAB — BASIC METABOLIC PANEL
ANION GAP: 10 mmol/L (ref 5–14)
BLOOD UREA NITROGEN: 23 mg/dL (ref 9–23)
BUN / CREAT RATIO: 29
CALCIUM: 9.1 mg/dL (ref 8.7–10.4)
CHLORIDE: 104 mmol/L (ref 98–107)
CO2: 29 mmol/L (ref 20.0–31.0)
CREATININE: 0.78 mg/dL (ref 0.55–1.02)
EGFR CKD-EPI (2021) FEMALE: 82 mL/min/{1.73_m2} (ref >=60)
GLUCOSE RANDOM: 122 mg/dL (ref 70–179)
POTASSIUM: 4.2 mmol/L (ref 3.4–4.8)
SODIUM: 143 mmol/L (ref 135–145)

## 2024-03-07 LAB — MAGNESIUM: MAGNESIUM: 2 mg/dL (ref 1.6–2.6)

## 2024-03-07 LAB — PHOSPHORUS: PHOSPHORUS: 2.9 mg/dL (ref 2.4–5.1)

## 2024-03-07 MED ADMIN — traZODone (DESYREL) tablet 150 mg: 150 mg | ORAL | @ 02:00:00

## 2024-03-07 MED ADMIN — atorvastatin (LIPITOR) tablet 80 mg: 80 mg | ORAL | @ 13:00:00

## 2024-03-07 MED ADMIN — thiamine mononitrate (vit B1) tablet 100 mg: 100 mg | ORAL | @ 13:00:00

## 2024-03-07 MED ADMIN — lurasidone (LATUDA) tablet 20 mg: 20 mg | ORAL | @ 13:00:00

## 2024-03-07 MED ADMIN — pantoprazole (Protonix) EC tablet 20 mg: 20 mg | ORAL | @ 13:00:00

## 2024-03-07 MED ADMIN — albuterol (PROVENTIL HFA;VENTOLIN HFA) 90 mcg/actuation inhaler 2 puff: 2 | RESPIRATORY_TRACT | @ 13:00:00

## 2024-03-07 MED ADMIN — mirtazapine (REMERON) tablet 30 mg: 30 mg | ORAL | @ 02:00:00

## 2024-03-07 NOTE — Progress Notes (Signed)
 Psychiatry      Daily Progress Note      Admit date/time: 02/26/2024  5:00 PM   LOS: 7 days      Assessment:    Patient is a 69 y.o. 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    Diagnostic formulation:   The patient's presentation, including symptoms of low mood, difficulty sleeping, anhedonia, weight loss, difficulty concentrating, anxiety, and recent suicide attempt (via cutting wrists), appears to be most consistent with a diagnosis of major depressive disorder. Could consider bipolar disorder given reported history; however, lack of any clear hypomanic or manic episodes in her history make this less likely. Should also consider the contribution of trauma symptoms given diagnosis of PTSD, history of trauma, and reports of nightmares and hypervigilance. Must also consider contribution of psychosocial stressors, as per daughter patient's anxiety significantly worsened after receiving court summons for financial stressors in October.     Additionally, patient and daughters report sub-acute cognitive decline (confusion, inability to complete IDLs, getting lost in familiar situations) over the last year that abruptly and significantly worsened in December. She was receiving work-up for potential shunt dysfunction with her outpatient neurosurgeon, who she sees for a history of NPH, but did not complete imaging due to anxiety. Should also consider potential underlying neurocognitive disorder in addition to shunt dysfunction given MRI from 2023 with chronic small vessel ischemic changes.     As of 03/07/2024, continued hospitalization is warranted given continued debilitating anxiety and depression, inability to care for self independently, complex medical conditions requiring treatment, memory difficulties, medication titration, and need for safe discharge planning. Patient reports significant worsening of mood and anxiety over the weekend in light of her new cancer diagnosis. She has shared ambivalence regarding living and her ability to keep herself safe at home. Per family, they do not feel patient is safe to return home by herself, as evidenced by her recent suicide attempt via cutting her wrists. Additionally, they have concerns about her ability to care for herself and complete ADLs/IDLs at home.     Latuda  has been the most effective medication for managing mood and anxiety in this patient previously, and therefore would be a good option currently. However, Latuda  is best absorbed with meals and given her current state of significant malnutrition (BMI 16.39), was likely not being absorbed prior to hospitalization. As nutritional status improves, we expect this medication to take effect. Patient has increased nutritional intake, with nursing documenting that patient is eating on average 75-100% of meals, in addition to supplement shakes. Despite this, she has lost 2 lbs since admission. Re-consulted dietician, who recommended increasing her number of supplements. Given her malnutrition and now increasing PO intake, she is at significant risk of refeeding syndrome; will continue close monitoring of electrolytes, which requires inpatient level of care. Plan to consult palliative care today to help with conversations about GOC and managing chronic illness.        Diagnoses:   Active Problems:    Severe episode of recurrent major depressive disorder, without psychotic features    (CMS-HCC)    Anxiety      Plan:  Safety:  -- Continue admission to inpatient psychiatric unit for safety, stabilization, and treatment.     -- Civil Commitment Status:Involuntary    -- On unit/off unit level of supervision: q15 min checks /  Restrict to Unit  -- Medical equipment: The treatment team has determined that this patient requires the continued use of medical equipment  medical bed for their care and healing. 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  -- Consult oncology, recs appreciated   -- Interventional pulmonology plan for bronchoscopy with EBUS   -- MRI Brain and CT Abd/Pelvic without signs of mets    # c/f Pancreatic cysts   -- CT Abd with numerous lesions in pancrease concerning for pancreatic cysts   -- Will need outpatient MRI abd w wo after discharge for further work-up    #Endometrial thickening   -- CT Pelvis showed prominent endometrium measuring up to 1.3 cm   -- Will need outpatient endovaginal ultrasound for further work-up    #Nutritional Rehabilitation  -- Start 100mg  Vitamin B1  -- BMP, Mg, Phos q24 labs to monitor for refeeding syndrome  -- Track meal percentages and InOs  -- Nutrition follollwing  -- Supplementation with Ensure Plus 3x daily with meals     # HLD  -- continue home 80mg  atorvastatin      #GERD  -- continue home 20mg  pantoprazole       #Constipation  -- start nightly senna per patient report     #NPH, sp shunt (2023)  --Consult neurosurgery, recs appreciated, shunt adjusted      #Essential Thrombocythemia   --Consult hematology, recs appreciated   -- Defer ET medications at this time given presence of subdural hematomas   -- Discussed with hematology in light of recent lung findings, will continue to defer for the moment    -- Metabolic Monitoring:   Initial Weight: Weight: 45.5 kg (100 lb 4.8 oz)  Last Weight: Weight: 44.7 kg (98 lb 8 oz)  Last BMI: Body mass index is 16.39 kg/m??.  Admit BP: BP: 115/92  Last BP: BP: 122/74  Lipid Panel:   Lab Results   Component Value Date    Cholesterol, Total 152 12/08/2022    LDL Direct 53.0 08/24/2023    Cholesterol, LDL, Calculated 111 (H) 03/11/2022    Cholesterol, HDL 58 03/11/2022    Triglycerides 104 03/11/2022     Hemoglobin A1C:   Lab Results   Component Value Date    Hemoglobin A1C 5.3 05/29/2022      Fasting Blood Sugar:   Lab Results   Component Value Date    Glucose 107 (H) 12/16/2023         Social/Disposition:  -- TBD, clarity needed on current diagnosis to determine level of outpatient care patient is appropriate for. Daughters currently touring facilities in [place] .      Subjective:    Hours of sleep overnight :   7.28       Per 03/06/24 RN Epic note:   Alert and oriented x 4. Pt says they are in a pleasant mood. No thoughts of self harm or harm to others. No auditory or visual hallucinations.  Safety interventions and behavioral plan review were maintained, with no self-injurious or unsafe behaviors documented.  Fall reduction strategies and mobility support were consistently implemented, with no falls or injuries reported.   Nutritional status remained excellent throughout the shift.   Infection prevention measures were upheld, and no infection-related symptoms were documented.   Overall, the environment remained safe and patient interactions were calm and cooperative.     MD interview:   Didn't sleep well last night, coughing more than usual. Reviewed medicine recommendations and assessment that she may have a URI. Discussed bronchoscopy with biopsy this week. Having some dull back pain. Anxiety is high, hasn't been using prn seroquel  much. Has been coping by taking naps, very tired. Reviewed Latuda  is most effective when taken with more calories.       Objective:    Vitals:  Vitals:    03/07/24 0749   BP: 122/74   Pulse: 108   Resp: 18   Temp: 35.9 ??C (96.7 ??F)   SpO2: 99%       Mental Status Exam:    Appearance:    Appears stated age and Malnourished   Motor:   No abnormal movements   Speech/Language:    Normal rate, volume, tone, fluency and Language intact, well formed    Mood:   Anxious    Affect:   Anxious and Cooperative   Thought process:   Logical, linear, clear, coherent, goal directed   Thought content:     Denies SI, HI, self harm, delusions, obsessions, paranoid ideation, or ideas of reference   Perceptual disturbances:     Denies auditory and visual hallucinations, behavior not concerning for response to internal stimuli     Insight:     Fair   Judgment:    Impaired   Impulse Control:   Impaired   Other:       PE:   Gen: No acute distress, well developed and well nourished  Resp: Normal work of breathing, intermittent coughing   Neuro: no tics/tremors    Test Results:  Data Review: Lab results last 24 hours:    Recent Results (from the past 24 hours)   Basic Metabolic Panel    Collection Time: 03/07/24 10:08 AM   Result Value Ref Range    Sodium 143 135 - 145 mmol/L    Potassium 4.2 3.4 - 4.8 mmol/L    Chloride 104 98 - 107 mmol/L    CO2 29.0 20.0 - 31.0 mmol/L    Anion Gap 10 5 - 14 mmol/L    BUN 23 9 - 23 mg/dL    Creatinine 9.21 9.44 - 1.02 mg/dL    BUN/Creatinine Ratio 29     eGFR CKD-EPI (2021) Female 81 >=60 mL/min/1.9m2    Glucose 122 70 - 179 mg/dL    Calcium 9.1 8.7 - 89.5 mg/dL   Magnesium Level    Collection Time: 03/07/24 10:08 AM   Result Value Ref Range    Magnesium 2.0 1.6 - 2.6 mg/dL   Phosphorus Level    Collection Time: 03/07/24 10:08 AM   Result Value Ref Range    Phosphorus 2.9 2.4 - 5.1 mg/dL   CBC w/ Differential    Collection Time: 03/07/24 10:08 AM   Result Value Ref Range    WBC 13.6 (H) 3.6 - 11.2 10*9/L    RBC 3.75 (L) 3.95 - 5.13 10*12/L    HGB 11.1 (L) 11.3 - 14.9 g/dL    HCT 67.2 (L) 65.9 - 44.0 %    MCV 87.3 77.6 - 95.7 fL    MCH 29.7 25.9 -  32.4 pg    MCHC 34.0 32.0 - 36.0 g/dL    RDW 85.7 87.7 - 84.7 %    MPV 7.7 6.8 - 10.7 fL    Platelet 455 (H) 150 - 450 10*9/L    Neutrophils % 84.3 %    Lymphocytes % 4.4 %    Monocytes % 9.7 %    Eosinophils % 1.0 %    Basophils % 0.6 %    Absolute Neutrophils 11.4 (H) 1.8 - 7.8 10*9/L    Absolute Lymphocytes 0.6 (L) 1.1 - 3.6 10*9/L    Absolute Monocytes 1.3 (H) 0.3 - 0.8 10*9/L    Absolute Eosinophils 0.1 0.0 - 0.5 10*9/L    Absolute Basophils 0.1 0.0 - 0.1 10*9/L       Imaging: Radiology report(s) reviewed.    Psychometrics:    Social Drivers of Health with Concerns     Tobacco Use: Medium Risk (03/01/2024)    Patient History     Smoking Tobacco Use: Former     Smokeless Tobacco Use: Never     Passive Exposure: Never   Physical Activity: Not on file   Stress: Not on file   Substance Use: Not on file (12/14/2022)   Social Connections: Socially Isolated (02/09/2024)    Received from [facility]    Social Connection and Isolation Panel     Frequency of Communication with Friends and Family: Three times a week     Frequency of Social Gatherings with Friends and Family: Three times a week     How often do you attend church or religious services?: Never     Do you belong to any clubs or organizations such as church groups, unions, fraternal or athletic groups, or school groups?: No     How often do you attend meetings of the clubs or organizations you belong to?: Never     Marital Status: Divorced   Physicist, Medical Strain: Patient Declined (10/15/2023)    Overall Financial Resource Strain (CARDIA)     Difficulty of Paying Living Expenses: Patient declined   Recent Concern: Physicist, Medical Strain - Medium Risk (09/23/2023)    Received from [facility]    Overall Financial Resource Strain (CARDIA)     Difficulty of Paying Living Expenses: Somewhat hard   Health Literacy: Not on file   Internet Connectivity: Internet connectivity concern identified (10/15/2023)    Internet Connectivity     Do you have access to internet services: No     How do you connect to the internet: Not on file     Is your internet connection strong enough for you to watch video on your device without major problems?: Not on file     Do you have enough data to get through the month?: Not on file Does at least one of the devices have a camera that you can use for video chat?: Not on file        ---  Time-based billing disclaimer:  I personally spent 60   minutes face-to-face and non-face-to-face in the care of this patient, which includes all pre, intra, and post visit time on the date of service.  All documented time was specific to the E/M visit and does not include any procedures that may have been performed.

## 2024-03-07 NOTE — Plan of Care (Addendum)
 Pt expressed frustrations r/t selection and repetiveness of current food menu; expressed difficulty eating d/t not feeling well [ ongoing runny nose, cough] ; VSS and strawberry yogurt snack provided; intends to discuss further with team     Shift Summary  Albuterol  was administered PRN for wheezing, with subsequent assessment indicating improvement and stability.   Patient participated in a music and relaxation Tai Chi group, presenting as calm and cooperative and adapting movements as needed.   Safety rounds and environmental modifications were maintained, with no unsafe or self-injurious behaviors observed.   CBC and metabolic panels were resulted, with some abnormal findings noted but no definitive conclusions documented in the shift.   Patient remained engaged in care and activities, with overall status stable and no new illness or injury documented during the shift.    Adheres to Safety Considerations for Self and Others: No self-injurious or destructive behaviors were observed, and safety interventions such as environmental modification, fall reduction, and safety rounds were maintained throughout the shift. Patient agreed with the plan of care and interacted appropriately with staff and peers, with boundaries reinforced and a calm environment promoted.    Absence of New-Onset Illness or Injury: No new cuts or wounds were noted, and pain remained at zero throughout the shift. Patient participated in group activities and followed directions, with no evidence of new injury or illness documented.    Absence of Infection Signs and Symptoms: Aseptic technique and infection prevention measures were maintained, and no documentation of infection symptoms was noted. CBC results showed elevated WBC and neutrophils, but no conclusions can be drawn from these findings alone.

## 2024-03-07 NOTE — Treatment Plan (Signed)
 Patient is booked for add on procedure (bronchoscopy) for tomorrow 03/08/2024  Please keep npo after midnight

## 2024-03-08 LAB — CBC W/ AUTO DIFF
BASOPHILS ABSOLUTE COUNT: 0.1 10*9/L (ref 0.0–0.1)
BASOPHILS RELATIVE PERCENT: 1 %
EOSINOPHILS ABSOLUTE COUNT: 0.3 10*9/L (ref 0.0–0.5)
EOSINOPHILS RELATIVE PERCENT: 2.5 %
HEMATOCRIT: 31.6 % — ABNORMAL LOW (ref 34.0–44.0)
HEMOGLOBIN: 10.8 g/dL — ABNORMAL LOW (ref 11.3–14.9)
LYMPHOCYTES ABSOLUTE COUNT: 1.2 10*9/L (ref 1.1–3.6)
LYMPHOCYTES RELATIVE PERCENT: 9 %
MEAN CORPUSCULAR HEMOGLOBIN CONC: 34.3 g/dL (ref 32.0–36.0)
MEAN CORPUSCULAR HEMOGLOBIN: 29.7 pg (ref 25.9–32.4)
MEAN CORPUSCULAR VOLUME: 86.6 fL (ref 77.6–95.7)
MEAN PLATELET VOLUME: 7.9 fL (ref 6.8–10.7)
MONOCYTES ABSOLUTE COUNT: 1.5 10*9/L — ABNORMAL HIGH (ref 0.3–0.8)
MONOCYTES RELATIVE PERCENT: 11.1 %
NEUTROPHILS ABSOLUTE COUNT: 10.1 10*9/L — ABNORMAL HIGH (ref 1.8–7.8)
NEUTROPHILS RELATIVE PERCENT: 76.4 %
PLATELET COUNT: 398 10*9/L (ref 150–450)
RED BLOOD CELL COUNT: 3.65 10*12/L — ABNORMAL LOW (ref 3.95–5.13)
RED CELL DISTRIBUTION WIDTH: 14 % (ref 12.2–15.2)
WBC ADJUSTED: 13.2 10*9/L — ABNORMAL HIGH (ref 3.6–11.2)

## 2024-03-08 MED ADMIN — traZODone (DESYREL) tablet 150 mg: 150 mg | ORAL | @ 02:00:00

## 2024-03-08 MED ADMIN — atorvastatin (LIPITOR) tablet 80 mg: 80 mg | ORAL | @ 13:00:00

## 2024-03-08 MED ADMIN — lactated Ringers infusion: INTRAVENOUS | @ 16:00:00 | Stop: 2024-03-08

## 2024-03-08 MED ADMIN — thiamine mononitrate (vit B1) tablet 100 mg: 100 mg | ORAL | @ 13:00:00

## 2024-03-08 MED ADMIN — phenylephrine 1 mg/10 mL (100 mcg/mL) injection Syrg: INTRAVENOUS | @ 17:00:00 | Stop: 2024-03-08

## 2024-03-08 MED ADMIN — phenylephrine 1 mg/10 mL (100 mcg/mL) injection Syrg: INTRAVENOUS | @ 16:00:00 | Stop: 2024-03-08

## 2024-03-08 MED ADMIN — lurasidone (LATUDA) tablet 20 mg: 20 mg | ORAL | @ 13:00:00

## 2024-03-08 MED ADMIN — Propofol (DIPRIVAN) injection: INTRAVENOUS | @ 16:00:00 | Stop: 2024-03-08

## 2024-03-08 MED ADMIN — pantoprazole (Protonix) EC tablet 20 mg: 20 mg | ORAL | @ 12:00:00

## 2024-03-08 MED ADMIN — albuterol (PROVENTIL HFA;VENTOLIN HFA) 90 mcg/actuation inhaler 2 puff: 2 | RESPIRATORY_TRACT | @ 02:00:00

## 2024-03-08 MED ADMIN — mirtazapine (REMERON) tablet 30 mg: 30 mg | ORAL | @ 02:00:00

## 2024-03-08 MED ADMIN — fentaNYL (PF) (SUBLIMAZE) injection: INTRAVENOUS | @ 16:00:00 | Stop: 2024-03-08

## 2024-03-08 MED ADMIN — ePHEDrine (PF) 25 mg/5 mL (5 mg/mL) in 0.9% sodium chloride syringe: INTRAVENOUS | @ 17:00:00 | Stop: 2024-03-08

## 2024-03-08 MED ADMIN — midazolam (VERSED) injection: INTRAVENOUS | @ 16:00:00 | Stop: 2024-03-08

## 2024-03-08 MED ADMIN — sodium chloride irrigation (NS) 0.9 % irrigation solution: @ 17:00:00 | Stop: 2024-03-08

## 2024-03-08 MED ADMIN — lidocaine (XYLOCAINE) 10 mg/mL (1 %) injection: TOPICAL | @ 17:00:00 | Stop: 2024-03-08

## 2024-03-08 MED ADMIN — ondansetron (ZOFRAN) injection: INTRAVENOUS | @ 16:00:00 | Stop: 2024-03-08

## 2024-03-08 MED ADMIN — dexAMETHasone (DECADRON) 4 mg/mL injection: INTRAVENOUS | @ 16:00:00 | Stop: 2024-03-08

## 2024-03-08 MED ADMIN — lidocaine (PF) (XYLOCAINE-MPF) 20 mg/mL (2 %) injection: INTRAVENOUS | @ 16:00:00 | Stop: 2024-03-08

## 2024-03-08 NOTE — Plan of Care (Signed)
 Alert, calm and cooperative with care. Denies SI/HI/AVH. Visible in dayroom during evening, appeared to sleep through the night.     Shift Summary  Senna was administered PRN for constipation during the shift.  Elopement precautions were reinforced, and the patient remained on a locked unit with no elopement risk noted.  Boundaries were reinforced and a calm, consistent environment was promoted to support coping skills.  Toileting was performed every 2 hours in advance of need as a fall prevention measure.  Infection prevention strategies, including hand hygiene and rest, were promoted throughout the shift.  Patient remained calm and cooperative, with independent mobility and no new injuries or falls during the shift.    Absence of New-Onset Illness or Injury: No new cuts or wounds were noted, and there were no self-injurious or destructive behaviors observed; infection prevention and safety interventions were maintained throughout the shift.    Optimized Coping Skills in Response to Life Stressors: Mood was described as calm but depressed, with appropriate affect and coherent thought processes; boundaries were reinforced and a calm, consistent environment was promoted.    Absence of Fall and Fall-Related Injury: No falls or unsafe behaviors occurred, and fall prevention interventions such as toileting every 2 hours and elopement precautions on a locked unit were in place; mobility remained independent and gait steady throughout the shift.    Improved Nutritional Intake: Nutrition was assessed as adequate, and independent feeding was maintained during the shift.

## 2024-03-08 NOTE — Progress Notes (Signed)
 Psychiatry      Daily Progress Note      Admit date/time: 02/26/2024  5:00 PM   LOS: 8 days      Assessment:    Patient is a 69 y.o. 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    Diagnostic formulation:   The patient's presentation, including symptoms of low mood, difficulty sleeping, anhedonia, weight loss, difficulty concentrating, anxiety, and recent suicide attempt (via cutting wrists), appears to be most consistent with a diagnosis of major depressive disorder. Could consider bipolar disorder given reported history; however, lack of any clear hypomanic or manic episodes in her history make this less likely. Should also consider the contribution of trauma symptoms given diagnosis of PTSD, history of trauma, and reports of nightmares and hypervigilance. Must also consider contribution of psychosocial stressors, as per daughter patient's anxiety significantly worsened after receiving court summons for financial stressors in October.     Additionally, patient and daughters report sub-acute cognitive decline (confusion, inability to complete IDLs, getting lost in familiar situations) over the last year that abruptly and significantly worsened in December. She was receiving work-up for potential shunt dysfunction with her outpatient neurosurgeon, who she sees for a history of NPH, but did not complete imaging due to anxiety. Should also consider potential underlying neurocognitive disorder in addition to shunt dysfunction given MRI from 2023 with chronic small vessel ischemic changes.     As of 03/08/2024, continued hospitalization is warranted given continued debilitating anxiety and depression, inability to care for self independently, complex medical conditions requiring treatment, and need for safe discharge planning. Patient reports significant worsening of mood and anxiety recently in light of her new cancer diagnosis. Per family, they do not feel patient is safe to return home by herself, as evidenced by her recent suicide attempt via cutting her wrists. Additionally, they have concerns about her ability to care for herself and complete ADLs/IDLs at home as she was not feeding herself or performing basic care prior to hospitalization.  Patient lives alone and has not been able to adequately care for herself. However, without knowing what her current diagnosis/prognosis is medically, it is difficult to know what level of care she will need or qualify for outpatient, making dispo challenging.    Medically, the patient is undergoing bronchoscopy and biopsy today for lung mass. Of note, her WBC has been up trending over the last couple of days, with persistent respiratory symptoms. Plan to get input from internal medicine on if any further workup is needed. Plan to consult palliative care after results from her biopsy to assist in long-term care planning. Continue to follow labs closely to monitor for refeeding syndrome, given continued weight loss and BMI 16.39 despite increasing intake.    Diagnoses:   Active Problems:    Severe episode of recurrent major depressive disorder, without psychotic features    (CMS-HCC)    Anxiety      Plan:  Safety:  -- Continue admission to inpatient psychiatric unit for safety, stabilization, and treatment.     -- Civil Commitment Status:Involuntary    -- On unit/off unit level of supervision: q15 min checks / Restrict to Unit  -- Medical equipment: The treatment team has determined that this patient requires the continued use of medical equipment  medical bed for their care and healing. Specific medical rationale for need  for equipment: advanced age (6 or above),        Psychiatry:  # MDD - c/f Neurocognitive Disorder  -- Latuda  20 mg daily  -- Mirtazapine  30 mg nightly  -- Trazodone  150 mg nightly  -- MOCA 23/30 this admission     # Self-Inflicted Wrist Wound   -- Consult wound nurse   -- Wound care evaluated lesion 1/21, healing well     # Therapy Interventions:  -- RT, OT, Milieu therapy    Medical:    #Lung mass w/ possible metastasis  -- Consult oncology, recs appreciated   -- Interventional pulmonology plan for bronchoscopy with EBUS today  -- MRI Brain and CT Abd/Pelvic without signs of additional mets  -- CT Chest concerning for mets to lymph nodes and pericardium    # c/f Pancreatic cysts   -- CT Abd with numerous lesions in pancrease concerning for pancreatic cysts   -- Will need outpatient MRI abd w wo after discharge for further work-up    #Endometrial thickening   -- CT Pelvis showed prominent endometrium measuring up to 1.3 cm   -- Will need outpatient endovaginal ultrasound for further work-up    #Nutritional Rehabilitation  -- Start 100mg  Vitamin B1  -- BMP, Mg, Phos q24 labs to monitor for refeeding syndrome  -- Track meal percentages and InOs  -- Nutrition follollwing  -- Supplementation with Ensure Plus 3x daily with meals     # HLD  -- continue home 80mg  atorvastatin      #GERD  -- continue home 20mg  pantoprazole       #Constipation  -- start nightly senna per patient report     #NPH, sp shunt (2023)  --Consult neurosurgery, recs appreciated, shunt adjusted      #Essential Thrombocythemia   --Consult hematology, recs appreciated   -- Defer ET medications at this time given presence of subdural hematomas   -- Discussed with hematology in light of recent lung findings, will continue to defer for the moment    -- Metabolic Monitoring:   Initial Weight: Weight: 45.5 kg (100 lb 4.8 oz)  Last Weight: Weight: 44.7 kg (98 lb 8 oz)  Last BMI: Body mass index is 16.39 kg/m??.  Admit BP: BP: 115/92  Last BP: BP: 107/85  Lipid Panel:   Lab Results   Component Value Date    Cholesterol, Total 152 12/08/2022    LDL Direct 53.0 08/24/2023    Cholesterol, LDL, Calculated 111 (H) 03/11/2022    Cholesterol, HDL 58 03/11/2022    Triglycerides 104 03/11/2022     Hemoglobin A1C:   Lab Results   Component Value Date    Hemoglobin A1C 5.3 05/29/2022      Fasting Blood Sugar:   Lab Results   Component Value Date    Glucose 107 (H) 12/16/2023         Social/Disposition:  -- TBD, clarity needed on current diagnosis to determine level of outpatient care patient is appropriate for. Daughters currently touring facilities in [place] .      Subjective:    Hours of sleep overnight :   7.77       Per 03/07/24 RN Epic note:   Pt expressed frustrations r/t selection and repetiveness of current food menu; expressed difficulty eating d/t not feeling well [ ongoing runny nose, cough] ; VSS and strawberry yogurt snack provided; intends to discuss further with team   Albuterol  was administered PRN for wheezing, with subsequent assessment indicating improvement and stability.   Patient  participated in a music and relaxation Tai Chi group, presenting as calm and cooperative and adapting movements as needed.   Safety rounds and environmental modifications were maintained, with no unsafe or self-injurious behaviors observed.   CBC and metabolic panels were resulted, with some abnormal findings noted but no definitive conclusions documented in the shift.   Patient remained engaged in care and activities, with overall status stable and no new illness or injury documented during the shift.    MD interview:   Doing okay, discuss bronchoscopy with biopsy today, feeling very nervous. Slept some yesterday. Doesn't feel like she did a good job eating yesterday. Denies thoughts of wanting to hurt herself. Answer basic questions about procedure. Has been using albuterol  inhaler. Having nasal congestion and sore throat. Still having intermittent dull pain in her back. Doesn't want to try lidocaine  patch right now.       Objective:    Vitals:  Vitals:    03/08/24 1012   BP: 107/85   Pulse: 99   Resp: 14   Temp: 37.2 ??C (99 ??F)   SpO2: 96%       Mental Status Exam:    Appearance:    Appears stated age and Malnourished   Motor:   No abnormal movements   Speech/Language:    Normal rate, volume, tone, fluency and Language intact, well formed    Mood:   Anxious    Affect:   Anxious, Cooperative, and Dysthymic   Thought process:   Logical, linear, clear, coherent, goal directed   Thought content:     Denies SI, HI, self harm, delusions, obsessions, paranoid ideation, or ideas of reference   Perceptual disturbances:     Denies auditory and visual hallucinations, behavior not concerning for response to internal stimuli     Insight:     Fair   Judgment:    Impaired   Impulse Control:   Impaired   Other:       PE:   Gen: No acute distress, well developed and well nourished  Resp: Normal work of breathing, intermittent coughing   Neuro: no tics/tremors    Test Results:  Data Review: Lab results last 24 hours:    Recent Results (from the past 24 hours)   CBC w/ Differential    Collection Time: 03/08/24  5:56 AM   Result Value Ref Range    WBC 13.2 (H) 3.6 - 11.2 10*9/L    RBC 3.65 (L) 3.95 - 5.13 10*12/L    HGB 10.8 (L) 11.3 - 14.9 g/dL    HCT 68.3 (L) 65.9 - 44.0 %    MCV 86.6 77.6 - 95.7 fL    MCH 29.7 25.9 - 32.4 pg    MCHC 34.3 32.0 - 36.0 g/dL    RDW 85.9 87.7 - 84.7 %    MPV 7.9 6.8 - 10.7 fL    Platelet 398 150 - 450 10*9/L    Neutrophils % 76.4 %    Lymphocytes % 9.0 %    Monocytes % 11.1 %    Eosinophils % 2.5 %    Basophils % 1.0 %    Absolute Neutrophils 10.1 (H) 1.8 - 7.8 10*9/L    Absolute Lymphocytes 1.2 1.1 - 3.6 10*9/L    Absolute Monocytes 1.5 (H) 0.3 - 0.8 10*9/L    Absolute Eosinophils 0.3 0.0 - 0.5 10*9/L    Absolute Basophils 0.1 0.0 - 0.1 10*9/L       Imaging: Radiology report(s) reviewed.  Psychometrics:    Social Drivers of Health with Concerns     Tobacco Use: Medium Risk (03/01/2024)    Patient History     Smoking Tobacco Use: Former     Smokeless Tobacco Use: Never     Passive Exposure: Never   Physical Activity: Not on file   Stress: Not on file Substance Use: Not on file (12/14/2022)   Social Connections: Socially Isolated (02/09/2024)    Received from [facility]    Social Connection and Isolation Panel     Frequency of Communication with Friends and Family: Three times a week     Frequency of Social Gatherings with Friends and Family: Three times a week     How often do you attend church or religious services?: Never     Do you belong to any clubs or organizations such as church groups, unions, fraternal or athletic groups, or school groups?: No     How often do you attend meetings of the clubs or organizations you belong to?: Never     Marital Status: Divorced   Physicist, Medical Strain: Patient Declined (10/15/2023)    Overall Financial Resource Strain (CARDIA)     Difficulty of Paying Living Expenses: Patient declined   Recent Concern: Physicist, Medical Strain - Medium Risk (09/23/2023)    Received from [facility]    Overall Financial Resource Strain (CARDIA)     Difficulty of Paying Living Expenses: Somewhat hard   Health Literacy: Not on file   Internet Connectivity: Internet connectivity concern identified (10/15/2023)    Internet Connectivity     Do you have access to internet services: No     How do you connect to the internet: Not on file     Is your internet connection strong enough for you to watch video on your device without major problems?: Not on file     Do you have enough data to get through the month?: Not on file     Does at least one of the devices have a camera that you can use for video chat?: Not on file        ---  Time-based billing disclaimer:  I personally spent 60   minutes face-to-face and non-face-to-face in the care of this patient, which includes all pre, intra, and post visit time on the date of service.  All documented time was specific to the E/M visit and does not include any procedures that may have been performed.

## 2024-03-08 NOTE — Op Note (Signed)
 See procedure notes.

## 2024-03-08 NOTE — Plan of Care (Addendum)
 A&O x4. Denies SI/HI/AVH. Visible in dayroom during evening. Appeared to sleep through the night. NPO since midnight    Shift Summary  Albuterol  was administered PRN for wheezing during the shift.  No new injuries or self-harm behaviors were documented, and patient remained alert and oriented.  Fall risk interventions, including scheduled toileting, were implemented and no falls occurred.  Patient maintained adequate nutrition and was able to feed self independently.  Overall, patient remained calm, cooperative, and independent in mobility and hygiene throughout the shift.    Absence of New-Onset Illness or Injury: No new cuts or wounds were noted, and no self-injurious or destructive behaviors were observed; patient remained alert and oriented with coherent thought processes and unimpaired cognition throughout the shift. Albuterol  was administered PRN for wheezing, with no further related concerns documented.    Absence of Fall and Fall-Related Injury: Patient walked frequently with a steady gait and no mobility limitations, remained independent in activity and hygiene, and no falls or unsafe behaviors were observed; fall risk interventions such as toileting every 2 hours were implemented.    Improved Nutritional Intake: Nutrition was documented as adequate, and patient was able to feed self independently; nonirritating oral fluids were promoted during the shift.

## 2024-03-08 NOTE — Plan of Care (Signed)
 1:1 spent with patient. Patient A/O x 4. She denies SI, HI, AVH. Patient is medication compliant and cooperative with staff. Patient is independent with ambulation and ADL's. No falls this shift. Meal intake adequate. She is not social with staff and peers. No unsafe behaviors at this time. Monitoring on going. Patient did well today she was anxious about her bronchoscopy but the procedure went well.  Patient spent time in the dayroom and spoke with her daughter.     Shift Summary  Multiple anesthetic and supportive medications were administered during the procedure, including propofol , fentaNYL , lidocaine , dexamethasone , ondansetron , phenylephrine , and ephedrine .   Safety interventions and fall prevention strategies were maintained throughout the shift, with no unsafe behaviors or falls documented.   Patient demonstrated appropriate mood, affect, and engagement with staff and peers, and participated in a supportive environment.   Bronchial lavage was performed and body fluid cell count obtained, with tissue cells and macrophages noted in the sample.   Patient remained cooperative and independent in mobility, with no injuries or adverse events documented during the shift.    Adheres to Safety Considerations for Self and Others: Safety interventions such as nonskid footwear, bed in lowest position, and locked bed wheels were maintained throughout the shift; no unsafe or self-injurious behaviors were observed and boundaries were reinforced.    Optimized Coping Skills in Response to Life Stressors: Mood was noted as calm and depressed, affect remained appropriate, and patient was cooperative with positive feedback and support provided during interactions.    Develops/Participates in Therapeutic Alliance to Support Successful Transition: Patient engaged with staff and other patients, demonstrated good eye contact, and participated in a calm, consistent environment with caregiver support.    Absence of Fall and Fall-Related Injury: Patient walked frequently and independently, with no symptoms noted during activity and hourly visual checks performed; no falls or injuries documented during the shift.

## 2024-03-08 NOTE — H&P (Signed)
 PRE-PROCEDURE HISTORY AND PHYSICAL EXAM    Christy Buchanan presents for her scheduled BRONCH, RIGID OR FLEXIBLE, INC FLUORO GUIDANCE, WHEN PERFORMED; WITH EBUS GUIDED TRANSTRACHEAL AND/OR TRANSBRONCHIAL SAMPLING, ONE OR TWO MEDIASTINAL AND/OR HILAR LYMPH NODE STATIONS OR STRUCTURES.    The indication for the procedure(s) is lung mass.      Past Medical History[1]  Past Surgical History[2]    Allergies  Allergies[3]    Medications  QUEtiapine , acetaminophen , atorvastatin , cyanocobalamin (vitamin B-12), docusate sodium , lurasidone , mirtazapine , omega-3 fatty acids -fish oil , omeprazole , raloxifene , simethicone, traZODone , and vitamins B1 B6 B12    Physical Examination  There were no vitals filed for this visit.  There is no height or weight on file to calculate BMI.  Mental Status: awake alert and oriented, Lungs: non labored breathing, and Heart:  normal rate     ASSESSMENT AND PLAN  [name] has been evaluated and deemed appropriate to undergo the planned BRONCH, RIGID OR FLEXIBLE, INC FLUORO GUIDANCE, WHEN PERFORMED; WITH EBUS GUIDED TRANSTRACHEAL AND/OR TRANSBRONCHIAL SAMPLING, ONE OR TWO MEDIASTINAL AND/OR HILAR LYMPH NODE STATIONS OR STRUCTURES.    I explained the possible benefits and risks of the BRONCH, RIGID OR FLEXIBLE, INC FLUORO GUIDANCE, WHEN PERFORMED; WITH EBUS GUIDED TRANSTRACHEAL AND/OR TRANSBRONCHIAL SAMPLING, ONE OR TWO MEDIASTINAL AND/OR HILAR LYMPH NODE STATIONS OR STRUCTURES  , including:  Slowing of breathing and abnormal heart rhythms.  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    Sjogren's disease (HHS-HCC)     Thrombocythemia     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    Placed 11.2023  [place]  150 is setting 11.2023    Vitamin D deficiency    [2]   Past Surgical History:  Procedure Laterality Date    AUGMENTATION MAMMAPLASTY Bilateral     24ish yrs    BREAST BIOPSY Left     malignant    CHEMOTHERAPY      MASTECTOMY Left 25 ish yrs ago    PR CREATE SHUNT:VENTRIC-PERITONEAL Right 12/30/2021    Procedure: CREAT SHUNT; VENTRICULO-PERITONEAL/PLEURAL;  Surgeon: Kari Donnice Gaster, MD;  Location: MAIN OR [place];  Service: Neurosurgery    PR LAP, SURG ENTEROLYSIS Right 12/30/2021    Procedure: LAPAROSCOPY, SURGICAL, ENTEROLYSIS (FREEING OF INTESTINAL ADHESION) (SEPARATE PROCEDURE);  Surgeon: Carlin Curtistine Route, MD;  Location: MAIN OR [facility];  Service: Trauma    RADIATION Left    [3]   Allergies  Allergen Reactions    Escitalopram  Other (See Comments)

## 2024-03-08 NOTE — Brief Op Note (Signed)
 Brief Operative Note  (CSN: [id number])      Date of Surgery: 03/08/2024    Pre-op Diagnosis: lung mass    Post-op Diagnosis: Mass of right lung [R91.8]    Procedure(s):  BRONCH, RIGID OR FLEXIBLE, INC FLUORO GUIDANCE, WHEN PERFORMED; WITH EBUS GUIDED TRANSTRACHEAL AND/OR TRANSBRONCHIAL SAMPLING, ONE OR TWO MEDIASTINAL AND/OR HILAR LYMPH NODE STATIONS OR STRUCTURES: 31652 (CPT??) (Qty: 2)    BRONCHOSCOPY, RIGID/FLEX, INCL FLUORO; W/TRANSBRONCH NDL ASPIRAT BX, TRACHEA, MAIN STEM &/OR LOBAR BRONCHUS: 31629 (CPT??) (Qty: 2)    BRONCHOSCOPY, RIGID OR FLEXIBLE, INCLUDING FLUOROSCOPIC GUIDANCE WHEN PERFORMED; WITH EXCISION OF TUMOR: 68359 (CPT??) (Qty: 2)    BRONCHOSCOPY, RIGID OR FLEXIBLE, INCLUDE FLUOROSCOPIC GUIDANCE WHEN PERFORMED; W/BRONCHIAL ALVEOLAR LAVAGE: 31624 (CPT??) (Qty: 2)    BRONCHOSCPY, RIGID OR FLEXIBLE, W/FLUORO; WITH THERAPEUTIC ASPIRATION OF TRACHEOBRONCHIAL TREE, INITIAL: 31645 (CPT??) (Qty: 2)    Note: Revisions to procedures should be made in chart - see Procedures activity.    Performing Service: Pulmonary  Surgeons and Role:     * Fontaine Selinda Beam, MD - Primary     DEWAINE Loni Gauze, MD    Assistant: None    Findings: Left airways normal. Exophytic lesion seen in the RMST bronchus causing stenosis of the RMST and BI and occlusion of the RUL orifice. EBUS-TBNA performed station 7x5. Conventional TBNA in RMST x8. Tumor exicion of RMST tumor improving stenosis. Therapeutic aspiration performed. BAL RLL 100cc in and 30cc out.     Anesthesia: General    Estimated Blood Loss: 5 mL    Complications: None    Specimens: None collected    Implants: * No implants in log *      Selinda DELENA Fontaine, MD   Date: 03/08/2024  Time: 11:51 AM

## 2024-03-08 NOTE — Consults (Signed)
 Interventional Pulmonary Follow Up Consult Note     Date of Service: 03/08/2024  Requesting Physician: Larraine Marcos Louder, MD   Requesting Service: Psychiatry (PSY)  Reason for consultation: Comprehensive evaluation of Lung Mass.      Recommendations     #4.6 Peribronchial RLL Mass   S/p Bronchoscopy EBUS station 7 lymph node and TBNA of RMS endobronchial mass  Tumor excision of Right proximal main stem mass.  BAL of RLL  Follow up the studies and coordinate Med onc and Rad onc once biopsy confirms the diagnosis               This patient was seen and evaluated with Dr.Smith. The recommendations outlined in this note were discussed w the primary team via phone. Please page the Interventional Pulmonology pager at [phone] with any questions and notify the IP service with discharge plans to ensure the patient has appropriate IP follow up. We will sign off at this time.     Oneil Merck, MD    Assessment     Interval Events: See and examined the patient. Bronchoscopy today. No new complaint       Impression: The patient is a 69 y/o woman with a prior history of breast cancer (status post chemotherapy and lumpectomy in 1995, currently in remission) who was found to have a large 4.6-cm mass in the peribronchovascular superior segment of the right lower lobe with transfissural extension into the posterior right upper lobe. She also has right hilar, mediastinal, right anterior diaphragmatic, and right internal mammary lymphadenopathy. Given these findings in the setting of a significant smoking history, there is high concern for malignancy. EBUS is planned to be scheduled for next week.    Problems addressed during this consult include lung mass.     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    Over the past 2-3 months, the patient has experienced significant unintentional weight loss and decreased appetite. A CT chest obtained during this admission revealed a large 4.6-cm mass in the peribronchovascular superior segment of the right lower lobe with transfissural extension into the posterior right upper lobe. 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    The patient endorses night sweats and a chronic cough.  Subjective:       Vitals - past 24 hours  Temp:  [36 ??C (96.8 ??F)-37.2 ??C (99 ??F)] 36 ??C (96.8 ??F)  Pulse:  [89-112] 96  SpO2 Pulse:  [87-99] 96  Resp:  [13-18] 17  BP: (97-146)/(50-85) 122/65  SpO2:  [93 %-97 %] 93 % Intake/Output  I/O last 3 completed shifts:  In: 2385 [P.O.:2385]  Out: -         Pertinent exam findings:   General appearance - well appearing, well nourished, and in no distress  Eyes - EOMI, PERRLA, anicteric sclerae,  pink conjunctiva  Mouth - moist mucous membranes, no pharyngeal erythema or exudates  Neck - Trachea midline, normal neck movement  Lymphatics - not examined  Heart - regular rate and rhythm, normal S1/S2, no gallops, rubs, or murmurs  Chest - equal expansion, clear to auscultation, no wheezes, rhonchi, or rales  Abdomen - soft, nontender, nondistended, no masses or organomegaly  Extremities - No lower extremity edema, no clubbing or cyanosis  Skin - normal coloration and turgor, no rashes, no suspicious skin lesions noted  Neurological - alert, oriented, normal speech, no focal findings or movement disorder noted      Pertinent Imaging Data:   - Ct chest on 03/02/24 showed  a large 4.6 cm mass in the peribronchovascular superior segment of the right lower lobe with transfissural extension to the posterior right upper lobe. 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     Current vent settings:  O2 Flow Rate (L/min):  [3 L/min-6 L/min] 3 L/min    Arterial Blood Gas:   No results for input(s): SPECTYPEART, PHART, PCO2ART, PO2ART, HCO3ART, BEART, O2SATART in the last 24 hours.     Venous Blood Gas:   No results for input(s): PHVEN, PCO2VEN, PO2VEN, HCO3VEN, BEVEN, O2SATVEN in the last 24 hours.     Cultures:  No results found for: BLOOD CULTURE, BLOOD CULTURE, ROUTINE, URINE CULTURE, COMPREHENSIVE, URINE CULTURE, COMPREHENSIVE, LOWER RESPIRATORY CULTURE  WBC (10*9/L)   Date Value   03/08/2024 13.2 (H)     WBC, UA (/HPF)   Date Value   03/03/2024 <1          Other Labs:  Lab Results   Component Value Date    WBC 13.2 (H) 03/08/2024    HGB 10.8 (L) 03/08/2024    HCT 31.6 (L) 03/08/2024    PLT 398 03/08/2024     Lab Results   Component Value Date    NA 143 03/07/2024    K 4.2 03/07/2024    CL 104 03/07/2024    CO2 29.0 03/07/2024    BUN 23 03/07/2024    CREATININE 0.78 03/07/2024    GLU 122 03/07/2024    CALCIUM 9.1 03/07/2024    MG 2.0 03/07/2024    PHOS 2.9 03/07/2024     Lab Results   Component Value Date    BILITOT 0.4 02/26/2024    PROT 7.1 02/26/2024    ALBUMIN 3.3 (L) 02/26/2024    ALT 26 02/26/2024    AST 21 02/26/2024    ALKPHOS 101 02/26/2024     Lab Results   Component Value Date    INR 1.02 12/15/2021    APTT 30.8 12/15/2021       Allergies & Home Medications Personally reviewed in Epic    Continuous Infusions:   Infusions Meds[1]    Scheduled Medications:   Scheduled Medications[2]    PRN medications:  PRN Medications[3]           Medical Decision Making on 03/08/2024     External Notes:    I reviewed the following: Progress note(s) , Consultant note(s) , ED note(s), and H&P note(s)    Results:   I reviewed the following labs/reports for this consult - imaging abnormal for lung mass    Tests Ordered:   See recommendations above    Historian: no independent historian required    Imaging: I independently viewed and  interpreted the following studies for this consult - CT images and MRI images       Risks of management:    - Decision regarding minor surgery with identified risk factors.                       [1] [2]    [Provider Hold] atorvastatin   80 mg Oral Daily    [Provider Hold] lurasidone   20 mg Oral Daily    mirtazapine   30 mg Oral Nightly    pantoprazole   20 mg Oral Daily before breakfast    [Provider Hold] thiamine  mononitrate (vit B1)  100 mg Oral Daily    traZODone   150 mg Oral Nightly   [3] albuterol , guaiFENesin , melatonin, cough/sore throat lozenge, QUEtiapine , senna

## 2024-03-09 LAB — BASIC METABOLIC PANEL
ANION GAP: 12 mmol/L (ref 5–14)
BLOOD UREA NITROGEN: 26 mg/dL — ABNORMAL HIGH (ref 9–23)
BUN / CREAT RATIO: 34
CALCIUM: 9.3 mg/dL (ref 8.7–10.4)
CHLORIDE: 103 mmol/L (ref 98–107)
CO2: 29 mmol/L (ref 20.0–31.0)
CREATININE: 0.77 mg/dL (ref 0.55–1.02)
EGFR CKD-EPI (2021) FEMALE: 84 mL/min/{1.73_m2} (ref >=60)
GLUCOSE RANDOM: 106 mg/dL (ref 70–179)
POTASSIUM: 4.1 mmol/L (ref 3.4–4.8)
SODIUM: 144 mmol/L (ref 135–145)

## 2024-03-09 LAB — MAGNESIUM: MAGNESIUM: 2.1 mg/dL (ref 1.6–2.6)

## 2024-03-09 LAB — PHOSPHORUS: PHOSPHORUS: 3.9 mg/dL (ref 2.4–5.1)

## 2024-03-09 MED ADMIN — traZODone (DESYREL) tablet 150 mg: 150 mg | ORAL | @ 02:00:00

## 2024-03-09 MED ADMIN — atorvastatin (LIPITOR) tablet 80 mg: 80 mg | ORAL | @ 14:00:00

## 2024-03-09 MED ADMIN — thiamine mononitrate (vit B1) tablet 100 mg: 100 mg | ORAL | @ 14:00:00

## 2024-03-09 MED ADMIN — lurasidone (LATUDA) tablet 20 mg: 20 mg | ORAL | @ 16:00:00

## 2024-03-09 MED ADMIN — pantoprazole (Protonix) EC tablet 20 mg: 20 mg | ORAL | @ 13:00:00

## 2024-03-09 MED ADMIN — albuterol (PROVENTIL HFA;VENTOLIN HFA) 90 mcg/actuation inhaler 2 puff: 2 | RESPIRATORY_TRACT | @ 20:00:00

## 2024-03-09 MED ADMIN — senna (SENOKOT) tablet 1 tablet: 1 | ORAL | @ 02:00:00

## 2024-03-09 MED ADMIN — mirtazapine (REMERON) tablet 30 mg: 30 mg | ORAL | @ 02:00:00

## 2024-03-09 NOTE — Plan of Care (Signed)
 1:1 spent with patient. Patient A/O x 4. She denies SI, HI, AVH. Patient is medication compliant and cooperative with staff. Patient is independent with ambulation and ADL's. No falls this shift. Meal intake adequate. She is not social with staff and peers but is easy to talk to when engaged. Attended some offered groups. No unsafe behaviors at this time. Monitoring on going. Patient acknowledged that she slept better last night and was  feeling less anxious.        Shift Summary  PRN albuterol  was administered for shortness of breath with improvement noted.   Participated in a spirituality group session for 45 minutes.   Behavioral plan and safety interventions were reviewed and reinforced throughout the shift.   No falls or unsafe behaviors occurred, and mobility remained independent.   Overall, remained safe, engaged, and stable during the shift.     Adheres to Safety Considerations for Self and Others: Remained oriented x4 and alert throughout the shift, with no unsafe or self-injurious behaviors observed and safety interventions such as nonskid footwear and bed in lowest position maintained; denied hallucinations and homicidal thoughts, and boundaries were reinforced as part of behavioral management.     Develops/Participates in Therapeutic Alliance to Support Successful Transition: Participated in a spirituality group for 45 minutes, demonstrated appropriate affect and ability to express feelings and thoughts, and engaged calmly and cooperatively with staff and other patients.     Absence of Fall and Fall-Related Injury: No falls or fall-related injuries occurred during the shift; patient walked frequently, had no mobility limitations, and used no assistive devices.

## 2024-03-09 NOTE — Consults (Signed)
 General Pulmonary Team Initial Consult Note     Date of Service: 03/09/2024  Requesting Physician: Larraine Marcos Louder, MD   Requesting Service: Psychiatry (PSY)  Reason for consultation: Comprehensive evaluation of COPD Exacerbation.    Hospital Problems:  Active Problems:    Severe episode of recurrent major depressive disorder, without psychotic features    (CMS-HCC)    Anxiety      HPI: Christy Buchanan is a 69 y.o. female with hx of left breast cancer s/p radiation and chemo, self-reported bipolar II, anxiety, MDD, and multiple complex medical illnesses, being admitted to [facility] Psychiatric inpatient service o/a worsening depression and reported symptoms of self-harm and poor self-care. During admission, patient had CT chest remarkable for a Large 4.6 cm mass centered in the peribronchovascular superior segment of the right lower lobe with transfissural extension to the posterior right upper lobe and concern for mets to pericardium. IP was consulted and had bronchoscopy with biopsy 03/08/24. While on admission she has had a cough, sometimes productive of sputum associated with wheezing, improved with albuterol  prn. After biopsy yesterday, patient is noted to have increased wheezing. Pulmonary was consulted for guidance in management in the setting of possible COPD exacerbation and +gram positive cocci from BAL and recent mild leukocytosis. No fevers recorded on chart.     Patient was previously not on any inhalers prior to admission. She smoked cigarettes for 25-30 years, 0.5 to 1 ppd, quit over 30 years ago. Denies fever, chest pain. [place], denies any other childhood exposures. No hx of asthma, no previous diagnosis of COPD.     Bronchoscopy Report:  Exophytic lesion seen in the RMST bronchus causing stenosis of the RMST and BI and occlusion of the RUL orifice. EBUS-TBNA performed station 7x5. Conventional TBNA in RMST x8. Tumor exicion of RMST tumor improving stenosis. Therapeutic aspiration performed. BAL RLL 100cc in and 30cc out.     Assessment     Interval Events: None       Impression: The patient has illness posing risk to life or function. My interpretation of the interval data I personally reviewed, interpreted and/or analyzed is notable for abnormal chest CT showing the right lower lobe mass, peribronchovascular distribution and extended through the fissures. Also noted are bilateral emphysematous changes, more predominantly in upper lobes. Given extensive smoking history, CXR showing hyperinflation with flattened diaphragm, high pre-test probability patient has COPD, even without PFTs. Also likely increased wheezing more on the right likely due to obstruction from the endobronchial mass. Patient has no clinical signs of infection with stable mild leukocytosis.     Problems addressed during this consult include COPD exacerbation.       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 Debborah. Please do not hesitate to page [phone] (gen pulmonary consult fellow) with questions. We appreciate the opportunity to assist in the care of this patient. The recommendations outlined in this note were discussed w the primary team via epic chat. We look forward to following with you.     Donia Gentle, MD      Subjective & Objective  Subjective: noted in HPI         Vitals - past 24 hours  Temp:  [35.6 ??C (96 ??F)-36.7 ??C (98 ??F)] 35.6 ??C (96 ??F)  Pulse:  [96-103] 103  SpO2 Pulse:  [96] 96  Resp:  [17-18] 18  BP: (114-133)/(61-66) 133/66  SpO2:  [93 %-100 %] 100 % Intake/Output  I/O last 3 completed shifts:  In: 750 [I.V.:750]  Out: 5 [Blood:5]        Pertinent exam findings:   General appearance - Elderly woman, thin, looks well   Eyes - EOMI, PERRLA, anicteric sclerae, pink conjunctiva  Mouth - not examined  Neck - Trachea midline, normal neck movement  Lymphatics - not examined  Heart - regular rate and rhythm, normal S1/S2, no gallops, rubs, or murmurs  Chest - expiratory wheezing noted bilaterally, more on right than left   Abdomen - soft, nontender, nondistended, no masses or organomegaly  Extremities - No lower extremity edema, no clubbing or cyanosis  Skin - normal coloration and turgor, no rashes, no suspicious skin lesions noted  Neurological - alert, oriented, normal speech, no focal findings or movement disorder noted    Relevant Imaging Data   - CXR on 02/26/2024 revealed Hyperinflated lungs with flattened diaphragm consistent with emphysema      - CT chest without contrast on 03/01/2024 revealed mass centered in the peribronchovascular superior segment of the right lower lobe with transfissural extension to the posterior right upper lobe , with diffuse bilateral emphysematous changes      Arterial Blood Gas:   No results for input(s): SPECTYPEART, PHART, PCO2ART, PO2ART, HCO3ART, BEART, O2SATART in the last 24 hours.     Venous Blood Gas:   No results for input(s): PHVEN, PCO2VEN, PO2VEN, HCO3VEN, BEVEN, O2SATVEN in the last 24 hours.     Cultures:  No results found for: BLOOD CULTURE, BLOOD CULTURE, ROUTINE, URINE CULTURE, COMPREHENSIVE, URINE CULTURE, COMPREHENSIVE, LOWER RESPIRATORY CULTURE  WBC (10*9/L)   Date Value   03/08/2024 13.2 (H)     WBC, UA (/HPF)   Date Value   03/03/2024 <1          Other Labs:  Lab Results   Component Value Date    WBC 13.2 (H) 03/08/2024    HGB 10.8 (L) 03/08/2024    HCT 31.6 (L) 03/08/2024    PLT 398 03/08/2024     Lab Results   Component Value Date    NA 144 03/09/2024    K 4.1 03/09/2024    CL 103 03/09/2024    CO2 29.0 03/09/2024    BUN 26 (H) 03/09/2024    CREATININE 0.77 03/09/2024    GLU 106 03/09/2024    CALCIUM 9.3 03/09/2024    MG 2.1 03/09/2024    PHOS 3.9 03/09/2024     Lab Results   Component Value Date    BILITOT 0.4 02/26/2024    PROT 7.1 02/26/2024    ALBUMIN 3.3 (L) 02/26/2024    ALT 26 02/26/2024    AST 21 02/26/2024    ALKPHOS 101 02/26/2024     Lab Results   Component Value Date    INR 1.02 12/15/2021    APTT 30.8 12/15/2021       Allergies & Home Medications   Personally reviewed in Epic    Continuous Infusions:   Infusions Meds[1]    Scheduled Medications:   Scheduled Medications[2]    PRN medications:  PRN Medications[3]  Medical Decision Making on 03/09/2024     External Notes:   I reviewed the following: Progress note(s) , Nursing note(s), Procedure/op note(s) , and H&P note(s)    Results:  I reviewed the following labs/reports for this consult - CBC unremarkable or unchanged from prior., chemistry unremarkable or unchanged from prior., microbiology abnormal d/t +1 gram positive cocci., and radiology reports abnormal d/t mass in RLL extending into upper lobe, with lymphadenoparthy.    Tests Ordered:   See recommendations above    Historian: Patient reports history above.    Imaging: I independently viewed and interpreted the following studies for this consult - CXR images , CT images, and bronchoscopy images       Risks of management:    - Management of prescription medications.                       [1] [2]    atorvastatin   80 mg Oral Daily    lurasidone   20 mg Oral Daily    lurasidone   20 mg Oral Once    mirtazapine   30 mg Oral Nightly    pantoprazole   20 mg Oral Daily before breakfast    thiamine  mononitrate (vit B1)  100 mg Oral Daily    traZODone   150 mg Oral Nightly   [3] albuterol , guaiFENesin , melatonin, cough/sore throat lozenge, QUEtiapine , senna

## 2024-03-09 NOTE — Progress Notes (Addendum)
 Psychiatry      Daily Progress Note      Admit date/time: 02/26/2024  5:00 PM   LOS: 9 days      Assessment:    Patient is a 69 y.o. 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    Diagnostic formulation:   The patient's presentation, including symptoms of low mood, difficulty sleeping, anhedonia, weight loss, difficulty concentrating, anxiety, and recent suicide attempt (via cutting wrists), appears to be most consistent with a diagnosis of major depressive disorder. Could consider bipolar disorder given reported history; however, lack of any clear hypomanic or manic episodes in her history make this less likely. Should also consider the contribution of trauma symptoms given diagnosis of PTSD, history of trauma, and reports of nightmares and hypervigilance. Must also consider contribution of psychosocial stressors, as per daughter patient's anxiety significantly worsened after receiving court summons for financial stressors in October.     Additionally, patient and daughters report sub-acute cognitive decline (confusion, inability to complete IDLs, getting lost in familiar situations) over the last year that abruptly and significantly worsened in December. She was receiving work-up for potential shunt dysfunction with her outpatient neurosurgeon, who she sees for a history of NPH, but did not complete imaging due to anxiety. Should also consider potential underlying neurocognitive disorder in addition to shunt dysfunction given MRI from 2023 with chronic small vessel ischemic changes.     As of 03/09/2024, continued hospitalization is warranted given continued debilitating anxiety and depression, inability to care for self independently, complex medical conditions requiring treatment, and need for safe discharge planning. Patient reports significant worsening of mood and anxiety recently in light of her new cancer diagnosis. Per family, they do not feel patient is safe to return home by herself, as evidenced by her recent suicide attempt via cutting her wrists. Additionally, they have concerns about her ability to care for herself and complete ADLs/IDLs at home as she was not feeding herself or performing basic care prior to hospitalization.  Patient lives alone and has not been able to adequately care for herself. However, without knowing what her current diagnosis/prognosis is medically, it is difficult to know what level of care she will need or qualify for outpatient, making dispo challenging.    Medically, the patient underwent a bronchoscopy and biopsy yesterday for lung mass. Biopsy results are still pending, but preliminary gram stain culture came back positive for gram positive cocci. Plan to consult pulmonology regarding management and treatment of the culture as well as emphysema management given persistent cough and worsening wheezing. Plan to also consult palliative care for assistance in clarifying GOC and chronic disease management while we wait for definitive diagnosis and biopsy results. Continue to follow BMP labs closely to monitor for refeeding syndrome.    Diagnoses:   Active Problems:    Severe episode of recurrent major depressive disorder, without psychotic features    (CMS-HCC)    Anxiety      Plan:  Safety:  -- Continue admission to inpatient psychiatric unit for safety, stabilization, and treatment.     -- Civil Commitment Status:Involuntary    -- On unit/off unit level of supervision: q15 min checks / 1:1 (staff:patient)  -- Medical equipment: The treatment team has determined that this patient requires the continued use of medical equipment  medical bed for their care and healing.  Specific medical rationale for need for equipment: advanced age (39 or above),        Psychiatry:  # MDD - c/f Neurocognitive Disorder  -- Latuda  20 mg daily  -- Mirtazapine  30 mg nightly  -- Trazodone  150 mg nightly  -- MOCA 23/30 this admission     # Self-Inflicted Wrist Wound   -- Consult wound nurse   -- Wound care evaluated lesion 1/21, healing well     # Therapy Interventions:  -- RT, OT, Milieu therapy    Medical:    #Lung mass w/ possible metastasis  -- Consult oncology, recs appreciated   -- Interventional pulmonology plan for bronchoscopy with EBUS 1/28; results pending   -- Consult pulmonology on treatment and management based on gram stain results  -- MRI Brain and CT Abd/Pelvic without signs of additional mets  -- CT Chest concerning for mets to lymph nodes and pericardium  -- Consult palliative care, recs appreciated    # c/f Pancreatic cysts   -- CT Abd with numerous lesions in pancrease concerning for pancreatic cysts   -- Will need outpatient MRI abd w wo after discharge for further work-up    #Endometrial thickening   -- CT Pelvis showed prominent endometrium measuring up to 1.3 cm   -- Will need outpatient endovaginal ultrasound for further work-up    #Nutritional Rehabilitation  -- Start 100mg  Vitamin B1  -- BMP, Mg, Phos q24 labs to monitor for refeeding syndrome  -- Track meal percentages and InOs  -- Nutrition follollwing  -- Supplementation with Ensure Plus 3x daily with meals     # HLD  -- continue home 80mg  atorvastatin      #GERD  -- continue home 20mg  pantoprazole       #Constipation  -- start nightly senna per patient report     #NPH, sp shunt (2023)  --Consult neurosurgery, recs appreciated, shunt adjusted      #Essential Thrombocythemia   --Consult hematology, recs appreciated   -- Defer ET medications at this time given presence of subdural hematomas   -- Discussed with hematology in light of recent lung findings, will continue to defer for the moment    -- Metabolic Monitoring:   Initial Weight: Weight: 45.5 kg (100 lb 4.8 oz)  Last Weight: Weight: 47.2 kg (104 lb 1.6 oz)  Last BMI: Body mass index is 17.32 kg/m??.  Admit BP: BP: 115/92  Last BP: BP: 133/66  Lipid Panel:   Lab Results   Component Value Date    Cholesterol, Total 152 12/08/2022    LDL Direct 53.0 08/24/2023    Cholesterol, LDL, Calculated 111 (H) 03/11/2022    Cholesterol, HDL 58 03/11/2022    Triglycerides 104 03/11/2022     Hemoglobin A1C:   Lab Results   Component Value Date    Hemoglobin A1C 5.3 05/29/2022      Fasting Blood Sugar:   Lab Results   Component Value Date    Glucose 107 (H) 12/16/2023         Social/Disposition:  -- TBD, clarity needed on current diagnosis to determine level of outpatient care patient is appropriate for. Daughters currently touring facilities in [place] .      Subjective:    Hours of sleep overnight :   6.92       Per 03/08/24 RN Epic note:   Alert, calm and cooperative with care. Denies SI/HI/AVH. Visible in dayroom during evening, appeared to sleep through the night.  Albuterol  was administered PRN for wheezing,  with subsequent assessment indicating improvement and stability.   Patient participated in a music and relaxation Tai Chi group, presenting as calm and cooperative and adapting movements as needed.   Safety rounds and environmental modifications were maintained, with no unsafe or self-injurious behaviors observed.   CBC and metabolic panels were resulted, with some abnormal findings noted but no definitive conclusions documented in the shift.   Patient remained engaged in care and activities, with overall status stable and no new illness or injury documented during the shift.    MD interview:   Feeling okay, yesterday went better than she thought it would. Slept well last night, mood is up and down, a little anxious. Ate dinner last night, breakfast this morning. Thinks breathing is a little raspier today. Reviewed bronchoscopy yesterday. Discuss palliative care consult.      Objective:    Vitals:  Vitals:    03/09/24 0720   BP: 133/66   Pulse: 103   Resp: 18   Temp: 35.6 ??C (96 ??F)   SpO2: 100%       Mental Status Exam:    Appearance:    Appears stated age and Malnourished   Motor:   No abnormal movements   Speech/Language:    Normal rate, volume, tone, fluency and Language intact, well formed    Mood:   Okay   Affect:   Anxious, Cooperative, and Dysthymic   Thought process:   Logical, linear, clear, coherent, goal directed   Thought content:     Denies SI, HI, self harm, delusions, obsessions, paranoid ideation, or ideas of reference   Perceptual disturbances:     Denies auditory and visual hallucinations, behavior not concerning for response to internal stimuli     Insight:     Fair   Judgment:    Impaired   Impulse Control:   Impaired   Other:       PE:   Gen: No acute distress, well developed and well nourished  Resp: Normal work of breathing, intermittent coughing   Neuro: no tics/tremors    Test Results:  Data Review: Lab results last 24 hours:    Recent Results (from the past 24 hours)   Magnesium  Level    Collection Time: 03/09/24  7:25 AM   Result Value Ref Range    Magnesium  2.1 1.6 - 2.6 mg/dL   Phosphorus Level    Collection Time: 03/09/24  7:25 AM   Result Value Ref Range    Phosphorus 3.9 2.4 - 5.1 mg/dL   Basic Metabolic Panel    Collection Time: 03/09/24  7:25 AM   Result Value Ref Range    Sodium 144 135 - 145 mmol/L    Potassium 4.1 3.4 - 4.8 mmol/L    Chloride 103 98 - 107 mmol/L    CO2 29.0 20.0 - 31.0 mmol/L    Anion Gap 12 5 - 14 mmol/L    BUN 26 (H) 9 - 23 mg/dL    Creatinine 9.22 9.44 - 1.02 mg/dL    BUN/Creatinine Ratio 34     eGFR CKD-EPI (2021) Female 84 >=60 mL/min/1.48m2    Glucose 106 70 - 179 mg/dL    Calcium 9.3 8.7 - 89.5 mg/dL       Imaging: Radiology report(s) reviewed.    Psychometrics:    Social Drivers of Health with Concerns     Tobacco Use: Medium Risk (03/01/2024)    Patient History     Smoking Tobacco Use: Former  Smokeless Tobacco Use: Never     Passive Exposure: Never   Physical Activity: Not on file   Stress: Not on file   Substance Use: Not on file (12/14/2022)   Social Connections: Socially Isolated (02/09/2024)    Received from [facility]    Social Connection and Isolation Panel     Frequency of Communication with Friends and Family: Three times a week     Frequency of Social Gatherings with Friends and Family: Three times a week     How often do you attend church or religious services?: Never     Do you belong to any clubs or organizations such as church groups, unions, fraternal or athletic groups, or school groups?: No     How often do you attend meetings of the clubs or organizations you belong to?: Never     Marital Status: Divorced   Physicist, Medical Strain: Patient Declined (10/15/2023)    Overall Financial Resource Strain (CARDIA)     Difficulty of Paying Living Expenses: Patient declined   Recent Concern: Physicist, Medical Strain - Medium Risk (09/23/2023)    Received from [facility]    Overall Financial Resource Strain (CARDIA)     Difficulty of Paying Living Expenses: Somewhat hard   Health Literacy: Not on file   Internet Connectivity: Internet connectivity concern identified (10/15/2023)    Internet Connectivity     Do you have access to internet services: No     How do you connect to the internet: Not on file     Is your internet connection strong enough for you to watch video on your device without major problems?: Not on file     Do you have enough data to get through the month?: Not on file     Does at least one of the devices have a camera that you can use for video chat?: Not on file        ---  Time-based billing disclaimer:  I personally spent 60   minutes face-to-face and non-face-to-face in the care of this patient, which includes all pre, intra, and post visit time on the date of service.  All documented time was specific to the E/M visit and does not include any procedures that may have been performed.      ___________________________    I have seen, interviewed and evaluated the patient, participating in the key portions of the service. I discussed the case with the resident and I agree with the findings and plan as documented in the Resident???s note. Larraine Louder MD    I personally spent 35 minutes face-to-face and non-face-to-face in the care of this patient, which includes all pre, intra, and post visit E/M time on the date of service. All documented time was specific to the E/M visit and does not include any pre, intra, post procedure related time.

## 2024-03-10 DIAGNOSIS — C3490 Malignant neoplasm of unspecified part of unspecified bronchus or lung: Principal | ICD-10-CM

## 2024-03-10 LAB — BASIC METABOLIC PANEL
ANION GAP: 12 mmol/L (ref 5–14)
BLOOD UREA NITROGEN: 20 mg/dL (ref 9–23)
BUN / CREAT RATIO: 34
CALCIUM: 8.8 mg/dL (ref 8.7–10.4)
CHLORIDE: 103 mmol/L (ref 98–107)
CO2: 26 mmol/L (ref 20.0–31.0)
CREATININE: 0.59 mg/dL (ref 0.55–1.02)
EGFR CKD-EPI (2021) FEMALE: >90 mL/min/{1.73_m2} (ref >=60)
GLUCOSE RANDOM: 157 mg/dL (ref 70–179)
POTASSIUM: 3.6 mmol/L (ref 3.4–4.8)
SODIUM: 141 mmol/L (ref 135–145)

## 2024-03-10 LAB — PHOSPHORUS: PHOSPHORUS: 2.4 mg/dL (ref 2.4–5.1)

## 2024-03-10 LAB — MAGNESIUM: MAGNESIUM: 1.8 mg/dL (ref 1.6–2.6)

## 2024-03-10 MED ADMIN — traZODone (DESYREL) tablet 150 mg: 150 mg | ORAL | @ 02:00:00

## 2024-03-10 MED ADMIN — atorvastatin (LIPITOR) tablet 80 mg: 80 mg | ORAL | @ 13:00:00

## 2024-03-10 MED ADMIN — thiamine mononitrate (vit B1) tablet 100 mg: 100 mg | ORAL | @ 13:00:00

## 2024-03-10 MED ADMIN — lurasidone (LATUDA) tablet 20 mg: 20 mg | ORAL | @ 13:00:00 | Stop: 2024-03-10

## 2024-03-10 MED ADMIN — pantoprazole (Protonix) EC tablet 20 mg: 20 mg | ORAL | @ 13:00:00

## 2024-03-10 MED ADMIN — albuterol (PROVENTIL HFA;VENTOLIN HFA) 90 mcg/actuation inhaler 2 puff: 2 | RESPIRATORY_TRACT

## 2024-03-10 MED ADMIN — albuterol (PROVENTIL HFA;VENTOLIN HFA) 90 mcg/actuation inhaler 2 puff: 2 | RESPIRATORY_TRACT | @ 14:00:00

## 2024-03-10 MED ADMIN — ipratropium-albuterol (DUO-NEB) 0.5-2.5 mg/3 mL nebulizer solution 3 mL: 3 mL | RESPIRATORY_TRACT | @ 02:00:00

## 2024-03-10 MED ADMIN — ipratropium-albuterol (DUO-NEB) 0.5-2.5 mg/3 mL nebulizer solution 3 mL: 3 mL | RESPIRATORY_TRACT | @ 12:00:00 | Stop: 2024-03-10

## 2024-03-10 MED ADMIN — ipratropium-albuterol (DUO-NEB) 0.5-2.5 mg/3 mL nebulizer solution 3 mL: 3 mL | RESPIRATORY_TRACT | @ 16:00:00 | Stop: 2024-03-10

## 2024-03-10 MED ADMIN — carboxymethylcellulose sodium (THERATEARS) 0.25 % ophthalmic solution 1 drop: 1 [drp] | OPHTHALMIC | @ 20:00:00

## 2024-03-10 MED ADMIN — mirtazapine (REMERON) tablet 30 mg: 30 mg | ORAL | @ 02:00:00

## 2024-03-10 NOTE — Plan of Care (Signed)
 Shift Summary  Pt alert and oriented x 4. States they are not in a good mood due to cancer diagnosis. Pt declined to speak any further about emotions. States no thoughts of self harm or homicidal ideations. No auditory or visual hallucinations.  Boundaries were reinforced and a calm environment was promoted during the shift .   No self-injurious or destructive behaviors were observed, and safety interventions were maintained .  Fall prevention interventions, including frequent toileting and hourly checks, were implemented and no falls occurred .  Aseptic technique and environmental surveillance were performed, with no new symptoms or wounds noted .  Independent feeding and adequate nutrition were maintained throughout the shift .  Remained calm, cooperative, and oriented, with no changes in pain or mental status .    Adheres to Safety Considerations for Self and Others: No self-injurious or destructive behaviors were observed, and boundaries were reinforced in a calm environment; safety interventions such as low bed, call light within reach, and non-skid footwear were maintained throughout the shift .    Absence of New-Onset Illness or Injury: No new cuts, wounds, or symptoms were noted, and aseptic technique and environmental surveillance were maintained .    Absence of Fall and Fall-Related Injury: No falls occurred, and fall prevention measures including frequent toileting, hourly checks, and steady gait were documented .    Improved Nutritional Intake: Nutrition was adequate and independent feeding was maintained throughout the shift .

## 2024-03-10 NOTE — Plan of Care (Signed)
 PT VSS, slight cough. Up ad lib. Palliative care visited, pt calm after visit. Helped pt laundry. Provide emotional support.   Shift Summary  Albuterol  was administered PRN in the morning, and carboxymethylcellulose sodium was given in the afternoon for dry eyes.  Christy Buchanan participated in music therapy sessions and was calm, cooperative, and engaged, while her participation in recreational therapy was minimal.  Safety and fall prevention interventions were maintained throughout the shift, with no incidents reported.  Labs were drawn midday, and results were available for review.  Christy Buchanan remained stable and independent in mobility, with no falls or injuries during the shift.    Adheres to Safety Considerations for Self and Others: No new cuts or wounds were noted, and there were no self-injurious or unsafe behaviors observed; safety interventions such as a low bed, locked unit, and environmental modifications were maintained throughout the shift.    Optimized Coping Skills in Response to Life Stressors: Christy Buchanan engaged in music therapy sessions and interacted appropriately, with mood noted as fair and affect appropriate or congruent; she participated minimally in recreational therapy but expressed appreciation for music therapy and was calm and cooperative during sessions.    Absence of Fall and Fall-Related Injury: No falls or fall-related injuries occurred, and fall prevention measures such as nonskid footwear, bed in lowest position, and hourly visual checks were consistently implemented; mobility remained steady and independent with no assistive devices required.

## 2024-03-10 NOTE — Consults (Signed)
 General Pulmonary Team Follow Up Consult Note     Date of Service: 03/10/2024  Requesting Physician: Larraine Marcos Louder, MD   Requesting Service: Psychiatry (PSY)  Reason for consultation: Comprehensive evaluation of COPD Exacerbation.    Hospital Problems:  Active Problems:    Severe episode of recurrent major depressive disorder, without psychotic features    (CMS-HCC)    Anxiety      HPI: Christy Buchanan is a 69 y.o. female with hx of left breast cancer s/p radiation and chemo, self-reported bipolar II, anxiety, MDD, and multiple complex medical illnesses, being admitted to [facility] inpatient service o/a worsening depression and reported symptoms of self-harm and poor self-care. During admission, patient had CT chest remarkable for a Large 4.6 cm mass centered in the peribronchovascular superior segment of the right lower lobe with transfissural extension to the posterior right upper lobe and concern for mets to pericardium. IP was consulted and had bronchoscopy with biopsy 03/08/24. While on admission she has had a cough, sometimes productive of sputum associated with wheezing, improved with albuterol  prn. After biopsy yesterday, patient is noted to have increased wheezing. Pulmonary was consulted for guidance in management in the setting of possible COPD exacerbation and +gram positive cocci from BAL and recent mild leukocytosis. No fevers recorded on chart.     Patient was previously not on any inhalers prior to admission. She smoked cigarettes for 25-30 years, 0.5 to 1 ppd, quit over 30 years ago. Denies fever, chest pain. [place], denies any other childhood exposures. No hx of asthma, no previous diagnosis of COPD.     Bronchoscopy Report:  Exophytic lesion seen in the RMST bronchus causing stenosis of the RMST and BI and occlusion of the RUL orifice. EBUS-TBNA performed station 7x5. Conventional TBNA in RMST x8. Tumor exicion of RMST tumor improving stenosis. Therapeutic aspiration performed. BAL RLL 100cc in and 30cc out.     Assessment     Interval Events:   -Patient reports intolerance to  nebulized duonebs. Prefers inhalers  -CXR stable   -Endobronchial biopsy path result: Rare atypical cells within necrotic and inflamed bronchial mucosa and submucosa, favor scant involvement by non-small cell carcinoma        Impression: The patient has illness posing risk to life or function. My interpretation of the interval data I personally reviewed, interpreted and/or analyzed is notable for abnormal chest CT showing the right lower lobe mass, peribronchovascular distribution and extended through the fissures. Also noted are bilateral emphysematous changes, more predominantly in upper lobes. Given extensive smoking history, CXR showing hyperinflation with flattened diaphragm, high pre-test probability patient has COPD, even without PFTs. Also likely increased wheezing more on the right likely due to obstruction from the endobronchial mass. Patient has no clinical signs of infection with stable mild leukocytosis.     Problems addressed during this consult include COPD exacerbation.       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 Debborah. Please do not hesitate to page [phone] (  gen pulmonary consult fellow) with questions. We appreciate the opportunity to assist in the care of this patient. The recommendations outlined in this note were discussed w the primary team via epic chat. We will sign off at this time.     Donia Gentle, MD      Subjective & Objective     Subjective: noted in HPI         Vitals - past 24 hours  Temp:  [36.7 ??C (98.1 ??F)-37.5 ??C (99.5 ??F)] 37.5 ??C (99.5 ??F)  Pulse:  [107-116] 116  Resp:  [18] 18  BP: (116-124)/(59-71) 124/71  SpO2:  [96 %] 96 % Intake/Output  I/O last 3 completed shifts:  In: 1528 [P.O.:1528]  Out: -         Pertinent exam findings:   General appearance - Elderly woman, thin, looks well   Eyes - EOMI, PERRLA, anicteric sclerae, pink conjunctiva  Mouth - not examined  Neck - Trachea midline, normal neck movement  Lymphatics - not examined  Heart - regular rate and rhythm, normal S1/S2, no gallops, rubs, or murmurs  Chest - expiratory wheezing noted bilaterally, more on right than left   Abdomen - soft, nontender, nondistended, no masses or organomegaly  Extremities - No lower extremity edema, no clubbing or cyanosis  Skin - normal coloration and turgor, no rashes, no suspicious skin lesions noted  Neurological - alert, oriented, normal speech, no focal findings or movement disorder noted    Relevant Imaging Data   - CXR on 02/26/2024 revealed Hyperinflated lungs with flattened diaphragm consistent with emphysema  -CXR 03/09/24: stable and similar to previous on 1/17      - CT chest without contrast on 03/01/2024 revealed mass centered in the peribronchovascular superior segment of the right lower lobe with transfissural extension to the posterior right upper lobe , with diffuse bilateral emphysematous changes      Arterial Blood Gas:   No results for input(s): SPECTYPEART, PHART, PCO2ART, PO2ART, HCO3ART, BEART, O2SATART in the last 24 hours.     Venous Blood Gas:   No results for input(s): PHVEN, PCO2VEN, PO2VEN, HCO3VEN, BEVEN, O2SATVEN in the last 24 hours.     Cultures:  No results found for: BLOOD CULTURE, BLOOD CULTURE, ROUTINE, URINE CULTURE, COMPREHENSIVE, URINE CULTURE, COMPREHENSIVE, LOWER RESPIRATORY CULTURE  WBC (10*9/L)   Date Value   03/08/2024 13.2 (H)     WBC, UA (/HPF)   Date Value   03/03/2024 <1 Other Labs:  Lab Results   Component Value Date    WBC 13.2 (H) 03/08/2024    HGB 10.8 (L) 03/08/2024    HCT 31.6 (L) 03/08/2024    PLT 398 03/08/2024     Lab Results   Component Value Date    NA 141 03/10/2024    K 3.6 03/10/2024    CL 103 03/10/2024    CO2 26.0 03/10/2024    BUN 20 03/10/2024    CREATININE 0.59 03/10/2024    GLU 157 03/10/2024    CALCIUM 8.8 03/10/2024    MG 1.8 03/10/2024    PHOS 2.4 03/10/2024     Lab Results   Component Value Date    BILITOT 0.4 02/26/2024    PROT 7.1 02/26/2024    ALBUMIN 3.3 (L) 02/26/2024    ALT 26 02/26/2024    AST 21 02/26/2024    ALKPHOS 101 02/26/2024     Lab Results   Component Value Date    INR 1.02 12/15/2021    APTT 30.8  12/15/2021       Allergies & Home Medications   Personally reviewed in Epic    Continuous Infusions:   Infusions Meds[1]    Scheduled Medications:   Scheduled Medications[2]    PRN medications:  PRN Medications[3]             Medical Decision Making on 03/10/2024     External Notes:   I reviewed the following: Progress note(s) , Nursing note(s), Procedure/op note(s) , and H&P note(s)    Results:  I reviewed the following labs/reports for this consult - CBC unremarkable or unchanged from prior., chemistry unremarkable or unchanged from prior., microbiology abnormal d/t +1 gram positive cocci., and radiology reports abnormal d/t mass in RLL extending into upper lobe, with lymphadenoparthy.    Tests Ordered:   See recommendations above    Historian: Patient reports history above.    Imaging: I independently viewed and interpreted the following studies for this consult - CXR images , CT images, and bronchoscopy images       Risks of management:    - Management of prescription medications.                         [1] [2]    atorvastatin   80 mg Oral Daily    ipratropium-albuterol   3 mL Nebulization Q6H (RT)    [START ON 03/11/2024] lurasidone   40 mg Oral Daily    mirtazapine   30 mg Oral Nightly    pantoprazole   20 mg Oral Daily before breakfast thiamine  mononitrate (vit B1)  100 mg Oral Daily    traZODone   150 mg Oral Nightly   [3] albuterol , guaiFENesin , melatonin, cough/sore throat lozenge, QUEtiapine , senna

## 2024-03-10 NOTE — Consults (Signed)
 Oncology Consult Note    Requesting Attending Physician :  Christy Marcos Louder, MD  Service Requesting Consult : Psychiatry (PSY)  Reason for Consult: New NSCLC, mixed adenosquamous  Primary Oncologist: N/A  Current Therapy: None    Assessment: Christy Buchanan is a 69 y.o. female with a PMH of essential thrombocythemia, breast cancer, bipolar II disorder, and normal pressure hydrocephalus who was admitted for worsening chronic cough and suicidal ideation. Oncology was consulted for evaluation of new diagnosis of lung cancer.     While admitted, she was noted to have significant weight loss and dyspnea. Given her 40+ pack year smoking history, a CT chest w/o contrast was obtained, revealing a large 4.6 cm mass in the peribronchovascular superior segment of the right lower lobe with transfissural extension to the posterior right upper lobe. 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 MRI brain and CT AP w contrast were negative for additional sites of disease. 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     We spoke to the patient's daughter, Christy Buchanan, who stated that patient didn't tolerate chemotherapy that well in the past and also is quite frail with poor p.o. intake and not sure if she would be able to tolerate chemotherapy this time around. She also felt her mother's main focus is on symptom relief without interest in aggressive measures. The main question that Christy Buchanan has is what the prognosis of her cancer is and based on that, whether she would qualify for hospice care. If her prognosis is poor (< 6 months), it appeared that Christy Buchanan (who is HCPOA) would be in favor of that approach.    We discussed the diagnosis with Christy Buchanan and explained that there are potential treatment options available, including immunotherapy. She states that she is tired and unsure if she wants to pursue treatment. Her main concerns are (1) cost of treatment and (2) where she will go following discharge. She would be open to seeing oncology in the outpatient setting for discussion of treatment options.     RECOMMENDATIONS  Will add IHC for PD-L1 to patient's biopsy, as this can help determine potential responsiveness to immunotherapy  Agree with palliative care consult  Patient would benefit from social work/case manager involvement regarding financial constraints/insurance issues that patient is concerned by  We will arrange for outpatient follow up with thoracic oncology once patient is nearing discharge         This patient has been staffed with Christy Buchanan These recommendations were discussed with the primary team.     Please contact the oncology consult fellow at [phone] with any further questions.    Christy Mow, MD  PGY-2 Visiting Resident  [facility]       -------------------------------------------------------------    HPI: Christy Buchanan is a 69 y.o. female and who is being seen at the request of Christy Marcos Louder, MD for evaluation of new diagnosis of lung cancer.   > Christy Buchanan presented to [facility] on 02/26/24 due to suicidal ideations and worsening non-productive cough. Per prior documentation, patient was evaluated at [facility] on 02/08/24 due to suicidal ideations. She was discharged from that hospital on 02/21/24. While in the ED this time, she underwent a CT head which revealed findings suggestive of bilateral chronic appearing subdural  collections that appeared larger. Patient was evaluated by neurosurgery team and Sophysa valve was adjusted. Psychiatry team has continued to evaluate patient for her suicidal ideations with mediation adjustments being made. Due to worsening respiratory symptoms and cough, patient underwent a CT thorax whih revealed a 4.6 cm mass in the peribronchovascular superior segment of the right lower lobe with extension to the upper lobe. There was also nodular thickening of the pericardium concerning for pericardial metastatic disease. Due to this, patient underwent a bronchoscopy on 03/08/24.  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 Oncology team was consulted for the aforementioned findings.     > I saw Christy Buchanan this afternoon while she was eating lunch. Overall, she was feeling okay but states the DuoNeb treatments she has been getting have been making her feel very jittery. She states her dry cough has been persistent with not much changes thus far. She does not have any chest pain but does feel a chest pressure sensation that is not worsened/improved with coughing or deep inspiration. She was made aware of her new lung cancer diagnosis by palliative care. She appeared tired and was concerned she didn't have the energy to move forward with further treatment.       Review of Systems: All positive and pertinent negatives are noted in the HPI; a 10 system review of systems was otherwise negative except as noted in HPI.    Oncologic History:  Hematology/Oncology History    No problem history exists.       Past Medical History[1]    Past Surgical History[2]    Family History[3]Father: colon cancer; mother: thyroid cancer, dementia       Social History [4]    Social History     Social History Narrative    PSYCHIATRIC HX:     -Current provider(s):  Christy Oneil Riis, MD [facility] Psychiatry)    -Suicide attempts/SIB: Attempts: YES, December 2025 via cutting her left wrist.     -Psych Hospitalizations:  YES, [facility] for approximately 2 weeks ago in December    -Med compliance hx: Fair        SUBSTANCE ABUSE HX:     -Current using substance: NO    -Hx w/d sxs: NO    -Sz Hx: NO    -DT Hx:NO        SOCIAL HX:    -Current living environment: Lives alone with 2 cats    -Current support(s): children    -Violence (perp): NO    -Access to Firearms: NO        -Guardian: NO        -Trauma: YES, childhood       Allergies: is allergic to escitalopram .    Medications:   Meds:Scheduled Medications[5]  Continuous Infusions:Infusions Meds[6]  PRN Meds:.PRN Medications[7]    [No matching plan found]  [No matching plan found]    Objective:   Vitals: Temp:  [36.7 ??C (98.1 ??F)-37.5 ??C (99.5 ??F)] 37.5 ??C (99.5 ??F)  Pulse:  [107-116] 116  Resp:  [18] 18  BP: (116-124)/(59-71) 124/71  MAP (mmHg):  [76-87] 87  SpO2:  [96 %] 96 %  I/O this shift:  In: 717 [P.O.:717]  Out: -     ECOG Performance Status: 1 - Restricted in physically strenuous activity but ambulatory and able to carry out work of a light or sedentary nature,  e.g., light house work, office work    Physical Exam:  BP 124/71  - Pulse 116  - Temp 37.5 ??C (99.5 ??F) (Temporal)  - Resp 18  - Ht 165.1 cm (5' 5)  - Wt 47.2 kg (104 lb 1.6 oz)  - SpO2 96%  - BMI 17.32 kg/m??    General appearance - alert, well appearing, and in no distress and anxious   Mental status - alert, oriented to person, place, and time, anxious   Eyes - pupils equal and reactive, extraocular eye movements intact   Nose - normal and patent, no erythema, discharge or polyps   Mouth - mucous membranes moist, pharynx normal without lesions   Neck - supple   Lymphatics - not examined   Pulmonary - n/a  Neurological - alert, oriented, normal speech, no focal findings or movement disorder noted   Musculoskeletal - no joint tenderness, deformity or swelling   Extremities - peripheral pulses normal, no pedal edema, no clubbing or cyanosis   Skin - normal coloration and turgor, no rashes, no suspicious skin lesions noted     Test Results  Imaging: Radiology studies were personally reviewed    XR Chest Portable  Result Date: 03/09/2024   Impression: Right paratracheal lymphadenopathy.     CT Abdomen Pelvis W Contrast  Result Date: 03/04/2024   Impression: 1.  No metastatic disease in the abdomen or pelvis. 2.  Redemonstrated 2.2 cm heterogeneous right anterior diaphragmatic nodal metastases. 3.  Numerous low-attenuation lesions in the pancreas measuring up to 1.2 cm, probable pancreatic cysts. No pancreatic ductal dilation. Consider dedicated outpatient MRI abdomen with and without contrast versus attention on follow-up as clinically indicated. 4.  Indeterminate homogeneous left lower pole lesion, possible hemorrhagic/proteinaceous cyst. Consider dedicated outpatient MRI abdomen with and without contrast versus attention on follow-up as clinically indicated. 5.  Prominent endometrium measuring up to 1.3 cm. Recommend outpatient endovaginal ultrasound for further evaluation. 6.  Similar trace right pleural effusion with mild pleural thickening at the right lung base. 7.  Additional chronic and incidental findings as described.     MRI Brain W Wo Contrast  Result Date: 03/04/2024   Impression: --No evidence for intracranial metastatic disease. --Marked, diffuse dural thickening and enhancement and right subdural fluid collection with enlarged dural venous sinuses send mild prominence of the pituitary gland. The constellation of findings suggest intracranial hypotension, possibly related to over shunting.     CT Chest Wo Contrast  Result Date: 03/02/2024   Impression: 1.  Large 4.6 cm mass centered in the peribronchovascular superior segment of the right lower lobe with transfissural extension to the posterior right upper lobe. Right hilar, mediastinal, right anterior diaphragmatic and right internal mammary lymphadenopathy. Findings compatible with primary lung cancer with metastatic lymphadenopathy. Recommend further evaluation with tissue sampling. 2.  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 3.  Smoking-related lung disease with emphysema.     XR Chest 2 views  Result Date: 02/26/2024   Impression: No acute abnormality.     XR Shunt Series  Result Date: 02/26/2024   Impression: Radiographically intact sophysa VP shunt programmed to position 5.    CT head WO contrast  Result Date: 02/26/2024   Impression: Bilateral chronic appearing subdural collections in the presence of shunted ventricles. Comparing to the previous report from 02/09/2024 it would seem the collections may be larger. If those prior images could be provided we can make a direct comparison. Ventricular  shunt catheter positioning as described above without evidence of ventricular enlargement.             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    Placed 11.2023  Sophysea  150 is setting 11.2023    Vitamin D deficiency    [2]   Past Surgical History:  Procedure Laterality Date    AUGMENTATION MAMMAPLASTY Bilateral     24ish yrs    BREAST BIOPSY Left     malignant    CHEMOTHERAPY      MASTECTOMY Left 25 ish yrs ago    PR BRNCHSC EBUS GUIDED SAMPL 1/2 NODE STATION/STRUX N/A 03/08/2024    Procedure: BRONCH, RIGID OR FLEXIBLE, INC FLUORO GUIDANCE, WHEN PERFORMED; WITH EBUS GUIDED TRANSTRACHEAL AND/OR TRANSBRONCHIAL SAMPLING, ONE OR TWO MEDIASTINAL AND/OR HILAR LYMPH NODE STATIONS OR STRUCTURES;  Surgeon: Fontaine Selinda Beam, MD;  [facility];  Service: Pulmonary    PR BRONCHOSCOPY W/THER ASPIR TRACHBRNCL TREE 1ST Right 03/08/2024    Procedure: BRONCHOSCPY, RIGID OR FLEXIBLE, W/FLUORO; WITH THERAPEUTIC ASPIRATION OF TRACHEOBRONCHIAL TREE, INITIAL;  Surgeon: Fontaine Selinda Beam, MD;  Location: [facility];  Service: Pulmonary    PR BRONCHOSCOPY,DIAGNOSTIC W LAVAGE Right 03/08/2024    Procedure: BRONCHOSCOPY, RIGID OR FLEXIBLE, INCLUDE FLUOROSCOPIC GUIDANCE WHEN PERFORMED; W/BRONCHIAL ALVEOLAR LAVAGE;  Surgeon: Fontaine Selinda Beam, MD;  Location: [facility];  Service: Pulmonary    PR BRONCHOSCOPY,EXCIS LESN Right 03/08/2024    Procedure: BRONCHOSCOPY, RIGID OR FLEXIBLE, INCLUDING FLUOROSCOPIC GUIDANCE WHEN PERFORMED; WITH EXCISION OF TUMOR;  Surgeon: Fontaine Selinda Beam, MD;  Location: [facility];  Service: Pulmonary    PR BRONCHOSCOPY,TRANSBRON ASPIR BX Right 03/08/2024    Procedure: BRONCHOSCOPY, RIGID/FLEX, INCL FLUORO; W/TRANSBRONCH NDL ASPIRAT BX, TRACHEA, MAIN STEM &/OR LOBAR BRONCHUS;  Surgeon: Fontaine Selinda Beam, MD;  Location: [facility];  Service: Pulmonary    PR CREATE SHUNT:VENTRIC-PERITONEAL Right 12/30/2021    Procedure: CREAT SHUNT; VENTRICULO-PERITONEAL/PLEURAL;  Surgeon: Kari Donnice Gaster, MD;  Location: MAIN OR [facility];  Service: Neurosurgery    PR LAP, SURG ENTEROLYSIS Right 12/30/2021    Procedure: LAPAROSCOPY, SURGICAL, ENTEROLYSIS (FREEING OF INTESTINAL ADHESION) (SEPARATE PROCEDURE);  Surgeon: Carlin Curtistine Route, MD;  Location: MAIN OR [facility];  Service: Trauma    RADIATION Left    [3]   Family History  Problem Relation Age of Onset    Cancer Mother     Heart disease Mother     Colon cancer Father     Cancer Father     No Known Problems Sister     No Known Problems Brother     BRCA 1/2 Daughter     BRCA 1/2 Daughter     BRCA 1/2 Daughter     No Known Problems Son     No Known Problems Maternal Grandmother     No Known Problems Maternal Grandfather     No Known Problems Paternal Grandmother     No Known Problems Paternal Grandfather     No Known Problems Other  Breast cancer Neg Hx     Endometrial cancer Neg Hx     Ovarian cancer Neg Hx    [4]   Social History  Socioeconomic History    Marital status: Divorced   Tobacco Use    Smoking status: Former Types: Cigarettes     Passive exposure: Never    Smokeless tobacco: Never   Vaping Use    Vaping status: Never Used   Substance and Sexual Activity    Alcohol use: Not Currently     Comment: rarely    Drug use: Not Currently   Social History Narrative    PSYCHIATRIC HX:     -Current provider(s):  Christy Oneil Riis, MD [facility] Psychiatry)    -Suicide attempts/SIB: Attempts: YES, December 2025 via cutting her left wrist.     -Psych Hospitalizations:  YES, [facility] for approximately 2 weeks ago in December    -Med compliance hx: Fair        SUBSTANCE ABUSE HX:     -Current using substance: NO    -Hx w/d sxs: NO    -Sz Hx: NO    -DT Hx:NO        SOCIAL HX:    -Current living environment: Lives alone with 2 cats    -Current support(s): children    -Violence (perp): NO    -Access to Firearms: NO        -Guardian: NO        -Trauma: YES, childhood     Social Drivers of Health     Food Insecurity: No Food Insecurity (02/09/2024)    Received from [facility]    Hunger Vital Sign     Within the past 12 months, you worried that your food would run out before you got the money to buy more.: Never true     Within the past 12 months, the food you bought just didn't last and you didn't have money to get more.: Never true   Tobacco Use: Medium Risk (03/01/2024)    Patient History     Smoking Tobacco Use: Former     Smokeless Tobacco Use: Never     Passive Exposure: Never   Transportation Needs: No Transportation Needs (02/09/2024)    Received from [facility] - Transportation     In the past 12 months, has lack of transportation kept you from medical appointments or from getting medications?: No     In the past 12 months, has lack of transportation kept you from meetings, work, or from getting things needed for daily living?: No   Alcohol Use: Not At Risk (02/29/2024)    Alcohol Use     How often do you have a drink containing alcohol?: Never     How many drinks containing alcohol do you have on a typical day when you are drinking?: 1 - 2     How often do you have 5 or more drinks on one occasion?: Never   Housing: Low Risk (10/15/2023)    Housing     Within the past 12 months, have you ever stayed: outside, in a car, in a tent, in an overnight shelter, or temporarily in someone else's home (i.e. couch-surfing)?: No     Are you worried about losing your housing?: No   Utilities: Not At Risk (02/09/2024)    Received from [facility] Utilities     In the past 12 months has the electric, gas, oil, or  water company threatened to shut off services in your home?: No   Interpersonal Safety: Not At Risk (02/29/2024)    Interpersonal Safety     Unsafe Where You Currently Live: No     Physically Hurt by Anyone: No     Abused by Anyone: No   Social Connections: Socially Isolated (02/09/2024)    Received from [facility]    Social Connection and Isolation Panel     Frequency of Communication with Friends and Family: Three times a week     Frequency of Social Gatherings with Friends and Family: Three times a week     How often do you attend church or religious services?: Never     Do you belong to any clubs or organizations such as church groups, unions, fraternal or athletic groups, or school groups?: No     How often do you attend meetings of the clubs or organizations you belong to?: Never     Marital Status: Divorced   Physicist, Medical Strain: Patient Declined (10/15/2023)    Overall Financial Resource Strain (CARDIA)     Difficulty of Paying Living Expenses: Patient declined   Recent Concern: Financial Resource Strain - Medium Risk (09/23/2023)    Received from [facility]    Overall Financial Resource Strain (CARDIA)     Difficulty of Paying Living Expenses: Somewhat hard   Internet Connectivity: Internet connectivity concern identified (10/15/2023)    Internet Connectivity     Do you have access to internet services: No   [5]    atorvastatin   80 mg Oral Daily    ipratropium-albuterol   3 mL Nebulization Q6H (RT) [START ON 03/11/2024] lurasidone   40 mg Oral Daily    mirtazapine   30 mg Oral Nightly    pantoprazole   20 mg Oral Daily before breakfast    thiamine  mononitrate (vit B1)  100 mg Oral Daily    traZODone   150 mg Oral Nightly   [6] [7] albuterol , guaiFENesin , melatonin, cough/sore throat lozenge, QUEtiapine , senna

## 2024-03-10 NOTE — Plan of Care (Addendum)
 Shift Summary  Pt was visited by her daughter, visit went well. Pt was visible in the milieu. Calm, pleasant and engaged during conversation with this clinical research associate. No c/o verbalized. Pt had nebulizer tx, pt refused morning dose of neb due to machine making so much noise, pt stated  I dont like it. No acute distress. No PRN med requested.   06:50 Pt asked for nebulizer tx this morning.  Ipratropium-albuterol  was administered via nebulizer for respiratory symptoms, with post-treatment assessment noting diminished breath sounds and good treatment tolerance.  Safety interventions were consistently implemented, and no unsafe or self-injurious behaviors were observed during the shift.  Mood was calm but depressed, and coping skills were supported through environmental and behavioral interventions.  Interpersonal interactions were positive, with support from staff, other patients, and a visitor earlier in the shift.  Overall, safety was maintained, coping skills were reinforced, and therapeutic alliance was supported throughout the shift.    Adheres to Safety Considerations for Self and Others: No new cuts or wounds were noted, self-injurious and destructive behaviors were denied or not observed, and safety interventions such as environmental modification, low bed, and nonskid footwear were maintained throughout the shift. Hourly visual checks were performed, and the environment remained safe with no elopement risk or unsafe behaviors observed.    Optimized Coping Skills in Response to Life Stressors: Mood was described as calm but depressed, affect was appropriate, and impulse control was promoted with boundaries reinforced in a calm environment; no hallucinations, delusions, or homicidal thoughts were reported, and thought processes remained coherent.    Develops/Participates in Therapeutic Alliance to Support Successful Transition: Interpersonal interactions were calm, cooperative, and pleasant, with support provided by staff and other patients, and visitor behaviors were appropriate and supportive.

## 2024-03-10 NOTE — Consults (Signed)
 Palliative Care Consult Note            Consultation from Requesting Attending Physician:  Christy Marcos Louder, MD  Service Requesting Consult:  Psychiatry (PSY)  Reason for Consult Request from Attending Physician:  Evaluation of Symptoms, Goals of Care / Decision Making, and Patient and Family Support  Primary Care Provider:  Adrianna Evalene Mt, DO  Primary Oncologist: Not yet established      Assessment/Plan:      SUMMARY:  This 69 y.o. patient is seriously and acutely ill due to new lung mass concerning for non-small cell lung cancer-biopsy pending. Illness is complicated by co-morbid acute and chronic conditions including frailty, malnutrition, depression (psych following), and loss of independence.       -- Christy Buchanan is seen in consultation at the request of primary team for symptom evaluation and eventual goals of care, pending further input from oncology regarding treatment options.    Intro visit today to palliative care.  Today Christy Buchanan  seems to have a good idea that she likely has a new lung cancer,  and with that overall a poor prognosis. She knows she may have potential treatment options however, as of right now  is not sure that she would be interested in pursuing them.  Today she seemed to have  good insight that she is not sure she is up for the side effects of treatment, and is not wanting to have to rely heavily on family for caregiver needs given the overall poor prognosis.  -- Discussed updates and symptom recommendations with primary team via epic chat.   -- Palliative Care plans to visit the patient again on 2/1       Symptom Assessment and Recommendations:      # Thoracic chest pain and dyspnea with associated dry cough  Recommend:  -- Start low dose oxycodone  2.5mg  po every  4 hours PRN       # prevention of opioid-induced constipation  Recommendation:  -- Miralax   daily PRN  -- Senna 2 tabs nightly           Goals of Care and Decision Making Assessment and Recommendations: Prognosis / prognostic understanding:  not discussed  Decisional capacity at time of visit:  Full  Healthcare Decision Maker if lacks capacity:    HCDM (patient stated preference): Christy Buchanan - Daughter - [phone]  Advance Directive: no  Code status:   Code Status: Full Code       Current Goals of care:    Ongoing, pending input from oncology treatment options      Practical, Emotional, Spiritual Support Recommendations:  -- Introduced role of palliative care and symptom management, decision making, psychosocial support, and goals of care    Thank you for this consult. Please contact Christy Buchanan, AGNP via Epic or Palliative Care [phone]) if there are any questions.       Subjective:     YEP:naujpwzi from medical record and patient      Symptom Severity, Assessment and Current Medication / Treatment:     Pain:  per above  Shortness of breath:  with exertion  Nausea:  denies  Constipation: denies  Sleep: poor quality and quantity due to dyspnea  Anxiety/Depression:  low mood - psych following. Feels well supported by her daughter  Appetite:  Poor  Fatigue: Remains independent in ADLs, though endorses  progressive fatigue      Living situation:   Previously lived independently in [place] .  However, with  new cancer does gnosis feels that she is no longer going to be able to care for herself on her own  Support system / caregivers: She has 3 daughters.  Daughter Christy Buchanan is her local support  Coping / spiritual: Leans on her daughters for strength.  She used to have hobbies (though does not want expand on this today), however is having a hard time finding joy in the things that she has typically enjoyed.      Allergies:  Allergies[1]    Medications:  Scheduled Meds:Scheduled Medications[2]  Continuous Infusions:Infusions Meds[3]  PRN Meds:.PRN Medications[4]     Past Medical History[5]    Past Surgical History[6]          Objective:       Function:  70% - Ambulation: Reduced / unable to do normal work, some evidence of disease / Self-Care: Full / Intake: Normal or reduced / Level of Conscious: Full    Temp:  [36.7 ??C (98.1 ??F)-37.5 ??C (99.5 ??F)] 37.5 ??C (99.5 ??F)  Pulse:  [107-116] 116  Resp:  [18] 18  BP: (116-124)/(59-71) 124/71  SpO2:  [96 %] 96 %    Physical Exam:  Constitutional: Frail middle-aged female.  Chronically-ill appearing.  Ambulating independently no acute distress.  Eyes: anicteric sclera  ENMT: Moist oral mucosa  Pulm: Mild dyspnea with exertion with dry cough  CV: Not assessed  Abd: Nontender nondistended  MS: Moves extremities freely.  No notable sarcopenia  Skin:  Warm/dry.  Neuro: cognitive status: Alert and oriented x 3 muscle strength: Strong bilateral grip  Psych: Attentive. Speech fluent and repetition intact. Mood appropriate; no evidence of disordered thinking      Labs and imaging reviewed:    Low albumin 3.3-consistent with severe protein-calorie malnutrition  Low hemoglobin and hematocrit consistent with anemia of chronic illness    I personally interpreted CT chest test imaging from 1/21 1: Results are suggestive of lung mass concerning for lung cancer.    Lung biopsy 02/2824 demonstrates non-small cell lung cancer    I personally spent 60 minutes face-to-face and non-face-to-face in the care of this patient, which includes all pre, intra, and post visit time on the date of service.  All documented time was specific to the E/M visit and does not include any procedures that may have been performed.     See ACP Note from today for additional billable service:  No    Christy Buchanan, Palliative Care AGNP        [1]   Allergies  Allergen Reactions    Escitalopram  Other (See Comments)   [2]    atorvastatin   80 mg Oral Daily    [START ON 03/11/2024] lurasidone   40 mg Oral Daily    mirtazapine   30 mg Oral Nightly    pantoprazole   20 mg Oral Daily before breakfast    thiamine  mononitrate (vit B1)  100 mg Oral Daily    traZODone   150 mg Oral Nightly   [3] [4] albuterol , carboxymethylcellulose sodium, guaiFENesin , melatonin, cough/sore throat lozenge, oxyCODONE , QUEtiapine , senna  [5]   Past Medical History:  Diagnosis Date    Autoimmune thyroiditis     Breast cancer    (CMS-HCC)     Difficult intravenous access     GERD (gastroesophageal reflux disease)     Hyperlipidemia     Osteoporosis     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    Sjogren's disease (HHS-HCC)     Thrombocythemia  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    Placed 11.2023  [place]  150 is setting 11.2023    Vitamin D deficiency    [6]   Past Surgical History:  Procedure Laterality Date    AUGMENTATION MAMMAPLASTY Bilateral     24ish yrs    BREAST BIOPSY Left     malignant    CHEMOTHERAPY      MASTECTOMY Left 25 ish yrs ago    PR BRNCHSC EBUS GUIDED SAMPL 1/2 NODE STATION/STRUX N/A 03/08/2024    Procedure: BRONCH, RIGID OR FLEXIBLE, INC FLUORO GUIDANCE, WHEN PERFORMED; WITH EBUS GUIDED TRANSTRACHEAL AND/OR TRANSBRONCHIAL SAMPLING, ONE OR TWO MEDIASTINAL AND/OR HILAR LYMPH NODE STATIONS OR STRUCTURES;  Surgeon: Fontaine Selinda Beam, MD;  [facility];  Service: Pulmonary    PR BRONCHOSCOPY W/THER ASPIR TRACHBRNCL TREE 1ST Right 03/08/2024    Procedure: BRONCHOSCPY, RIGID OR FLEXIBLE, W/FLUORO; WITH THERAPEUTIC ASPIRATION OF TRACHEOBRONCHIAL TREE, INITIAL;  Surgeon: Fontaine Selinda Beam, MD;  Location: [facility];  Service: Pulmonary    PR BRONCHOSCOPY,DIAGNOSTIC W LAVAGE Right 03/08/2024    Procedure: BRONCHOSCOPY, RIGID OR FLEXIBLE, INCLUDE FLUOROSCOPIC GUIDANCE WHEN PERFORMED; W/BRONCHIAL ALVEOLAR LAVAGE;  Surgeon: Fontaine Selinda Beam, MD;  Location: [facility];  Service: Pulmonary    PR BRONCHOSCOPY,EXCIS LESN Right 03/08/2024    Procedure: BRONCHOSCOPY, RIGID OR FLEXIBLE, INCLUDING FLUOROSCOPIC GUIDANCE WHEN PERFORMED; WITH EXCISION OF TUMOR;  Surgeon: Fontaine Selinda Beam, MD;  Location: [facility];  Service: Pulmonary    PR BRONCHOSCOPY,TRANSBRON ASPIR BX Right 03/08/2024    Procedure: BRONCHOSCOPY, RIGID/FLEX, INCL FLUORO; W/TRANSBRONCH NDL ASPIRAT BX, TRACHEA, MAIN STEM &/OR LOBAR BRONCHUS;  Surgeon: Fontaine Selinda Beam, MD;  Location: [facility];  Service: Pulmonary    PR CREATE SHUNT:VENTRIC-PERITONEAL Right 12/30/2021    Procedure: CREAT SHUNT; VENTRICULO-PERITONEAL/PLEURAL;  Surgeon: Kari Donnice Gaster, MD;  Location: MAIN OR [facility];  Service: Neurosurgery    PR LAP, SURG ENTEROLYSIS Right 12/30/2021    Procedure: LAPAROSCOPY, SURGICAL, ENTEROLYSIS (FREEING OF INTESTINAL ADHESION) (SEPARATE PROCEDURE);  Surgeon: Carlin Curtistine Route, MD;  Location: [facility];  Service: Trauma    RADIATION Left

## 2024-03-10 NOTE — Progress Notes (Signed)
 Psychiatry      Daily Progress Note      Admit date/time: 02/26/2024  5:00 PM   LOS: 10 days      Assessment:    Patient is a 69 y.o. 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    Diagnostic formulation:   The patient's presentation, including symptoms of low mood, difficulty sleeping, anhedonia, weight loss, difficulty concentrating, anxiety, and recent suicide attempt (via cutting wrists), appears to be most consistent with a diagnosis of major depressive disorder. Could consider bipolar disorder given reported history; however, lack of any clear hypomanic or manic episodes in her history make this less likely. Should also consider the contribution of trauma symptoms given diagnosis of PTSD, history of trauma, and reports of nightmares and hypervigilance. Must also consider contribution of psychosocial stressors, as per daughter patient's anxiety significantly worsened after receiving court summons for financial stressors in October.     Additionally, patient and daughters report sub-acute cognitive decline (confusion, inability to complete IDLs, getting lost in familiar situations) over the last year that abruptly and significantly worsened in December. She was receiving work-up for potential shunt dysfunction with her outpatient neurosurgeon, who she sees for a history of NPH, but did not complete imaging due to anxiety. Should also consider potential underlying neurocognitive disorder in addition to shunt dysfunction given MRI from 2023 with chronic small vessel ischemic changes.     As of 03/10/2024, continued hospitalization is warranted given continued debilitating anxiety and depression, inability to care for self independently, complex medical conditions requiring treatment, and need for safe discharge planning. Patient reports significant worsening of mood and anxiety recently in light of her new cancer diagnosis. Per family, they do not feel patient is safe to return home by herself, as evidenced by her recent suicide attempt via cutting her wrists. Additionally, they have concerns about her ability to care for herself and complete ADLs/IDLs at home as she was not feeding herself or performing basic care prior to hospitalization.  Patient lives alone and has not been able to adequately care for herself. However, without knowing what her current diagnosis/prognosis is medically, it is difficult to know what level of care she will need or qualify for outpatient, making dispo challenging.    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 Oncology plans to see patient today and evaluate for potential next steps in care. Palliative care also to see patient today to assist in coordination of care and support. Will plan to increase Latuda  to further target mood and anxiety now that her nutritional status is showing some improvements with continued supplementation. Pulmonology evaluated yesterday with concerns for potential COPD exacerbation, DuoNebs added to regimen per recs.      Diagnoses:   Active Problems:    Severe episode of recurrent major depressive disorder, without psychotic features    (CMS-HCC)    Anxiety      Plan:  Safety:  -- Continue admission to inpatient psychiatric unit for safety, stabilization, and treatment.     -- Civil Commitment Status:Involuntary    -- On unit/off unit level of supervision: q15 min checks / 1:1 (staff:patient)  -- Medical equipment: The treatment team has determined that this patient requires  the continued use of medical equipment  medical bed for their care and healing. Specific medical rationale for need for equipment: advanced age (7 or above),      Psychiatry:  # MDD - c/f Neurocognitive Disorder  -- Increase Latuda  40 mg daily  -- Mirtazapine  30 mg nightly  -- Trazodone  150 mg nightly  -- MOCA 23/30 this admission     # Self-Inflicted Wrist Wound   -- Consult wound nurse   -- Wound care evaluated lesion 1/21, healing well     # Therapy Interventions:  -- RT, OT, Milieu therapy    Medical:    #Lung mass w/ possible metastasis  -- Consult oncology, recs appreciated   -- Interventional pulmonology plan for bronchoscopy with EBUS 1/28; results pending   -- Per pulmonology no antibiotics at this time based on gram stain  -- MRI Brain and CT Abd/Pelvic without signs of additional mets  -- CT Chest concerning for mets to lymph nodes and pericardium  -- Consult palliative care, recs appreciated  -- Oncology to see patient today    #COPD  -- Continue Duoneb q6h per pulmonology   -- Continue albuterol  q4-6h PRN     # c/f Pancreatic cysts   -- CT Abd with numerous lesions in pancrease concerning for pancreatic cysts   -- Will need outpatient MRI abd w wo after discharge for further work-up    #Endometrial thickening   -- CT Pelvis showed prominent endometrium measuring up to 1.3 cm   -- Will need outpatient endovaginal ultrasound for further work-up    #Nutritional Rehabilitation  -- Start 100mg  Vitamin B1  -- BMP, Mg, Phos q24 labs to monitor for refeeding syndrome  -- Track meal percentages and InOs  -- Nutrition follollwing  -- Supplementation with Ensure Plus 3x daily with meals     # HLD  -- continue home 80mg  atorvastatin      #GERD  -- continue home 20mg  pantoprazole       #Constipation  -- start nightly senna per patient report     #NPH, sp shunt (2023)  --Consult neurosurgery, recs appreciated, shunt adjusted      #Essential Thrombocythemia   --Consult hematology, recs appreciated   -- Defer ET medications at this time given presence of subdural hematomas   -- Discussed with hematology in light of recent lung findings, will continue to defer for the moment    -- Metabolic Monitoring:   Initial Weight: Weight: 45.5 kg (100 lb 4.8 oz)  Last Weight: Weight: 47.2 kg (104 lb 1.6 oz)  Last BMI: Body mass index is 17.32 kg/m??.  Admit BP: BP: 115/92  Last BP: BP: 124/71  Lipid Panel:   Lab Results   Component Value Date    Cholesterol, Total 152 12/08/2022    LDL Direct 53.0 08/24/2023    Cholesterol, LDL, Calculated 111 (H) 03/11/2022    Cholesterol, HDL 58 03/11/2022    Triglycerides 104 03/11/2022     Hemoglobin A1C:   Lab Results   Component Value Date    Hemoglobin A1C 5.3 05/29/2022      Fasting Blood Sugar:   Lab Results   Component Value Date    Glucose 107 (H) 12/16/2023         Social/Disposition:  -- TBD, clarity needed on current diagnosis to determine level of outpatient care patient is appropriate for.      Subjective:    Hours of sleep overnight :   6.7  Per 03/09/24 RN Epic note:   Pt was visited by her daughter, visit went well. Pt was visible in the milieu. Calm, pleasant and engaged during conversation with this clinical research associate. No c/o verbalized. Pt had nebulizer tx, pt refused morning dose of neb due to machine making so much noise, pt stated  I dont like it. No acute distress. No PRN med requested.   06:50 Pt asked for nebulizer tx this morning  Ipratropium-albuterol  was administered via nebulizer for respiratory symptoms, with post-treatment assessment noting diminished breath sounds and good treatment tolerance.  Safety interventions were consistently implemented, and no unsafe or self-injurious behaviors were observed during the shift.  Mood was calm but depressed, and coping skills were supported through environmental and behavioral interventions.  Interpersonal interactions were positive, with support from staff, other patients, and a visitor earlier in the shift.  Overall, safety was maintained, coping skills were reinforced, and therapeutic alliance was supported throughout the shift.    MD interview:   Nilsa okay, seen by more medical consultants. Thinks nebulizer treatments are helpful. Discuss family travel. Review common adverse effects of Latuda . She thinks Latuda  has been helpful in terms of her mood and anxiety. Discuss recent changes in weight. Agreeable to trying a higher dose. Sleep was off last night, breathing wasn't easy. Mood has been up and down, thinking a lot about the future.       Objective:    Vitals:  Vitals:    03/10/24 0724   BP: 124/71   Pulse: 116   Resp: 18   Temp: 37.5 ??C (99.5 ??F)   SpO2: 96%       Mental Status Exam:    Appearance:    Appears stated age and Malnourished   Motor:   No abnormal movements   Speech/Language:    Normal rate, volume, tone, fluency and Language intact, well formed    Mood:   Alright   Affect:   Anxious, Cooperative, and Dysthymic   Thought process:   Logical, linear, clear, coherent, goal directed   Thought content:     Denies SI, HI, self harm, delusions, obsessions, paranoid ideation, or ideas of reference   Perceptual disturbances:     Denies auditory and visual hallucinations, behavior not concerning for response to internal stimuli     Insight:     Fair   Judgment:    Fair   Impulse Control:   Impaired   Other:       PE:   Gen: No acute distress, well developed and well nourished  Resp: Normal work of breathing, intermittent coughing   Neuro: no tics/tremors    Test Results:  Data Review: Lab results last 24 hours:    No results found for this or any previous visit (from the past 24 hours).      Imaging: Radiology report(s) reviewed.    Psychometrics:    Social Drivers of Health with Concerns     Tobacco Use: Medium Risk (03/01/2024)    Patient History     Smoking Tobacco Use: Former     Smokeless Tobacco Use: Never     Passive Exposure: Never   Physical Activity: Not on file   Stress: Not on file   Substance Use: Not on file (12/14/2022)   Social Connections: Socially Isolated (02/09/2024)    Received from [facility]    Social Connection and Isolation Panel     Frequency of Communication with Friends and Family: Three times a week  Frequency of Social Gatherings with Friends and Family: Three times a week     How often do you attend church or religious services?: Never     Do you belong to any clubs or organizations such as church groups, unions, fraternal or athletic groups, or school groups?: No     How often do you attend meetings of the clubs or organizations you belong to?: Never     Marital Status: Divorced   Physicist, Medical Strain: Patient Declined (10/15/2023)    Overall Financial Resource Strain (CARDIA)     Difficulty of Paying Living Expenses: Patient declined   Recent Concern: Physicist, Medical Strain - Medium Risk (09/23/2023)    Received from [facility]    Overall Financial Resource Strain (CARDIA)     Difficulty of Paying Living Expenses: Somewhat hard   Health Literacy: Not on file   Internet Connectivity: Internet connectivity concern identified (10/15/2023)    Internet Connectivity     Do you have access to internet services: No     How do you connect to the internet: Not on file     Is your internet connection strong enough for you to watch video on your device without major problems?: Not on file     Do you have enough data to get through the month?: Not on file     Does at least one of the devices have a camera that you can use for video chat?: Not on file        ---  Time-based billing disclaimer:  I personally spent 60   minutes face-to-face and non-face-to-face in the care of this patient, which includes all pre, intra, and post visit time on the date of service.  All documented time was specific to the E/M visit and does not include any procedures that may have been performed.

## 2024-03-11 LAB — BASIC METABOLIC PANEL
ANION GAP: 10 mmol/L (ref 5–14)
BLOOD UREA NITROGEN: 19 mg/dL (ref 9–23)
BUN / CREAT RATIO: 28
CALCIUM: 8.8 mg/dL (ref 8.7–10.4)
CHLORIDE: 106 mmol/L (ref 98–107)
CO2: 28 mmol/L (ref 20.0–31.0)
CREATININE: 0.67 mg/dL (ref 0.55–1.02)
EGFR CKD-EPI (2021) FEMALE: >90 mL/min/{1.73_m2} (ref >=60)
GLUCOSE RANDOM: 106 mg/dL (ref 70–179)
POTASSIUM: 4.1 mmol/L (ref 3.4–4.8)
SODIUM: 144 mmol/L (ref 135–145)

## 2024-03-11 LAB — PHOSPHORUS: PHOSPHORUS: 3.9 mg/dL (ref 2.4–5.1)

## 2024-03-11 LAB — MAGNESIUM: MAGNESIUM: 2.1 mg/dL (ref 1.6–2.6)

## 2024-03-11 MED ADMIN — traZODone (DESYREL) tablet 150 mg: 150 mg | ORAL | @ 02:00:00

## 2024-03-11 MED ADMIN — atorvastatin (LIPITOR) tablet 80 mg: 80 mg | ORAL | @ 13:00:00

## 2024-03-11 MED ADMIN — thiamine mononitrate (vit B1) tablet 100 mg: 100 mg | ORAL | @ 13:00:00

## 2024-03-11 MED ADMIN — pantoprazole (Protonix) EC tablet 20 mg: 20 mg | ORAL | @ 13:00:00

## 2024-03-11 MED ADMIN — albuterol (PROVENTIL HFA;VENTOLIN HFA) 90 mcg/actuation inhaler 2 puff: 2 | RESPIRATORY_TRACT | @ 10:00:00

## 2024-03-11 MED ADMIN — senna (SENOKOT) tablet 1 tablet: 1 | ORAL | @ 23:00:00

## 2024-03-11 MED ADMIN — mirtazapine (REMERON) tablet 30 mg: 30 mg | ORAL | @ 02:00:00

## 2024-03-11 MED ADMIN — guaiFENesin (ROBITUSSIN) oral syrup: 200 mg | ORAL | @ 13:00:00

## 2024-03-11 MED ADMIN — menthol (COUGH DROPS) lozenge 1 lozenge: 1 | ORAL | @ 13:00:00

## 2024-03-11 MED ADMIN — menthol (COUGH DROPS) lozenge 1 lozenge: 1 | ORAL | @ 23:00:00

## 2024-03-11 MED ADMIN — lurasidone (LATUDA) tablet 40 mg: 40 mg | ORAL | @ 13:00:00

## 2024-03-11 NOTE — Progress Notes (Signed)
 Psychiatry      Daily Progress Note      Admit date/time: 02/26/2024  5:00 PM   LOS: 11 days      Assessment:    Patient is a 69 y.o. 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    Diagnostic formulation:   The patient's presentation, including symptoms of low mood, difficulty sleeping, anhedonia, weight loss, difficulty concentrating, anxiety, and recent suicide attempt (via cutting wrists), appears to be most consistent with a diagnosis of major depressive disorder. Could consider bipolar disorder given reported history; however, lack of any clear hypomanic or manic episodes in her history make this less likely. Should also consider the contribution of trauma symptoms given diagnosis of PTSD, history of trauma, and reports of nightmares and hypervigilance. Must also consider contribution of psychosocial stressors, as per daughter patient's anxiety significantly worsened after receiving court summons for financial stressors in October.     Additionally, patient and daughters report sub-acute cognitive decline (confusion, inability to complete IDLs, getting lost in familiar situations) over the last year that abruptly and significantly worsened in December. She was receiving work-up for potential shunt dysfunction with her outpatient neurosurgeon, who she sees for a history of NPH, but did not complete imaging due to anxiety. Should also consider potential underlying neurocognitive disorder in addition to shunt dysfunction given MRI from 2023 with chronic small vessel ischemic changes.     As of 03/11/2024, continued hospitalization is warranted given continued debilitating anxiety and depression, inability to care for self independently, complex medical conditions requiring treatment, and need for safe discharge planning. Patient reports significant worsening of mood and anxiety recently in light of her new cancer diagnosis. Per family, they do not feel patient is safe to return home by herself, as evidenced by her recent suicide attempt via cutting her wrists. Additionally, they have concerns about her ability to care for herself and complete ADLs/IDLs at home as she was not feeding herself or performing basic care prior to hospitalization.  Patient lives alone and has not been able to adequately care for herself. However, without knowing what her current diagnosis/prognosis is medically, it is difficult to know what level of care she will need or qualify for outpatient, making dispo challenging.    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 Oncology plans to see patient today and evaluate for potential next steps in care. Palliative care also to see patient today to assist in coordination of care and support. Will plan to increase Latuda  to further target mood and anxiety now that her nutritional status is showing some improvements with continued supplementation. Pulmonology evaluated yesterday with concerns for potential COPD exacerbation, DuoNebs added to regimen per recs.    WKND:  1/31: vitals stable. Latuda  increased today, which she is tolerating well so far. No changes to treatment plan. Had BM this morning - hasn't taken oxycodone  PRN yet.     Diagnoses:   Active Problems:    Severe episode of recurrent major depressive disorder, without psychotic features    (CMS-HCC)    Anxiety      Plan:  Safety:  -- Continue admission to inpatient psychiatric unit for safety, stabilization, and treatment.     --  Civil Commitment Status:Involuntary    -- On unit/off unit level of supervision: q15 min checks / 1:1 (staff:patient)  -- Medical equipment: The treatment team has determined that this patient requires the continued use of medical equipment  medical bed for their care and healing. Specific medical rationale for need for equipment: advanced age (40 or above),      Psychiatry:  # MDD - c/f Neurocognitive Disorder  -- Increase Latuda  40 mg daily  -- Mirtazapine  30 mg nightly  -- Trazodone  150 mg nightly  -- MOCA 23/30 this admission     # Self-Inflicted Wrist Wound   -- Consult wound nurse   -- Wound care evaluated lesion 1/21, healing well     # Therapy Interventions:  -- RT, OT, Milieu therapy    Medical:    #Lung mass w/ possible metastasis  -- Consult oncology, recs appreciated   -- Interventional pulmonology plan for bronchoscopy with EBUS 1/28; results pending   -- Per pulmonology no antibiotics at this time based on gram stain  -- MRI Brain and CT Abd/Pelvic without signs of additional mets  -- CT Chest concerning for mets to lymph nodes and pericardium  -- Consult palliative care, recs appreciated  -- Oncology to see patient today    #COPD  -- Continue Duoneb q6h per pulmonology   -- Continue albuterol  q4-6h PRN     # c/f Pancreatic cysts   -- CT Abd with numerous lesions in pancrease concerning for pancreatic cysts   -- Will need outpatient MRI abd w wo after discharge for further work-up    #Endometrial thickening   -- CT Pelvis showed prominent endometrium measuring up to 1.3 cm   -- Will need outpatient endovaginal ultrasound for further work-up    #Nutritional Rehabilitation  -- Start 100mg  Vitamin B1  -- BMP, Mg, Phos q24 labs to monitor for refeeding syndrome  -- Track meal percentages and InOs  -- Nutrition follollwing  -- Supplementation with Ensure Plus 3x daily with meals     # HLD  -- continue home 80mg  atorvastatin      #GERD  -- continue home 20mg  pantoprazole       #Constipation  -- start nightly senna per patient report     #NPH, sp shunt (2023)  --Consult neurosurgery, recs appreciated, shunt adjusted      #Essential Thrombocythemia --Consult hematology, recs appreciated   -- Defer ET medications at this time given presence of subdural hematomas   -- Discussed with hematology in light of recent lung findings, will continue to defer for the moment    -- Metabolic Monitoring:   Initial Weight: Weight: 45.5 kg (100 lb 4.8 oz)  Last Weight: Weight: 47.2 kg (104 lb 1.6 oz)  Last BMI: Body mass index is 17.32 kg/m??.  Admit BP: BP: 115/92  Last BP: BP: 136/74  Lipid Panel:   Lab Results   Component Value Date    Cholesterol, Total 152 12/08/2022    LDL Direct 53.0 08/24/2023    Cholesterol, LDL, Calculated 111 (H) 03/11/2022    Cholesterol, HDL 58 03/11/2022    Triglycerides 104 03/11/2022     Hemoglobin A1C:   Lab Results   Component Value Date    Hemoglobin A1C 5.3 05/29/2022      Fasting Blood Sugar:   Lab Results   Component Value Date    Glucose 107 (H) 12/16/2023         Social/Disposition:  -- TBD, clarity needed on current diagnosis to determine level  of outpatient care patient is appropriate for.    Patient was seen and plan of care was discussed with the Attending MD, Dr. Severa , who agrees with the above statement and plan.     Asberry Needle, MD  I saw and evaluated the patient, participating in the key portions of the service.  I reviewed the resident???s note.  I agree with the resident???s findings and plan. Lamar MARLA Severa, MD    Subjective:    Hours of sleep overnight :   7.53       Per 03/10/24 RN Epic note:   Pt alert and oriented x 4. States they are not in a good mood due to cancer diagnosis. Pt declined to speak any further about emotions. States no thoughts of self harm or homicidal ideations. No auditory or visual hallucinations.    MD interview:   Didn't sleep great last night. Notes she has a lung condition now and she is trying to get used to the constant coughing, which can affect sleep. Mood is tired and sad. Appetite is not great. Denies SI. No issues with latuda  increase so far.     Objective:    Vitals:  Vitals: 03/11/24 0731   BP: 136/74   Pulse: 108   Resp: 17   Temp: 36.5 ??C (97.7 ??F)   SpO2: 96%       Mental Status Exam:    Appearance:    Appears stated age and Malnourished   Motor:   No abnormal movements   Speech/Language:    Normal rate, volume, tone, fluency and Language intact, well formed    Mood:   sad   Affect:   Anxious, Cooperative, and Dysthymic   Thought process:   Logical, linear, clear, coherent, goal directed   Thought content:     Denies SI, HI, self harm, delusions, obsessions, paranoid ideation, or ideas of reference   Perceptual disturbances:     Denies auditory and visual hallucinations, behavior not concerning for response to internal stimuli     Insight:     Fair   Judgment:    Fair   Impulse Control:   Impaired   Other:       PE:   Gen: No acute distress, well developed and well nourished  Resp: Normal work of breathing, intermittent coughing   Neuro: no tics/tremors    Test Results:  Data Review: Lab results last 24 hours:    Recent Results (from the past 24 hours)   Magnesium  Level    Collection Time: 03/10/24 10:16 AM   Result Value Ref Range    Magnesium  1.8 1.6 - 2.6 mg/dL   Phosphorus Level    Collection Time: 03/10/24 10:16 AM   Result Value Ref Range    Phosphorus 2.4 2.4 - 5.1 mg/dL   Basic Metabolic Panel    Collection Time: 03/10/24 10:16 AM   Result Value Ref Range    Sodium 141 135 - 145 mmol/L    Potassium 3.6 3.4 - 4.8 mmol/L    Chloride 103 98 - 107 mmol/L    CO2 26.0 20.0 - 31.0 mmol/L    Anion Gap 12 5 - 14 mmol/L    BUN 20 9 - 23 mg/dL    Creatinine 9.40 9.44 - 1.02 mg/dL    BUN/Creatinine Ratio 34     eGFR CKD-EPI (2021) Female >90 >=60 mL/min/1.53m2    Glucose 157 70 - 179 mg/dL    Calcium 8.8 8.7 - 89.5 mg/dL  Magnesium  Level    Collection Time: 03/11/24  7:03 AM   Result Value Ref Range    Magnesium  2.1 1.6 - 2.6 mg/dL   Phosphorus Level    Collection Time: 03/11/24  7:03 AM   Result Value Ref Range    Phosphorus 3.9 2.4 - 5.1 mg/dL   Basic Metabolic Panel    Collection Time: 03/11/24  7:03 AM   Result Value Ref Range    Sodium 144 135 - 145 mmol/L    Potassium 4.1 3.4 - 4.8 mmol/L    Chloride 106 98 - 107 mmol/L    CO2 28.0 20.0 - 31.0 mmol/L    Anion Gap 10 5 - 14 mmol/L    BUN 19 9 - 23 mg/dL    Creatinine 9.32 9.44 - 1.02 mg/dL    BUN/Creatinine Ratio 28     eGFR CKD-EPI (2021) Female >90 >=60 mL/min/1.42m2    Glucose 106 70 - 179 mg/dL    Calcium 8.8 8.7 - 89.5 mg/dL         Imaging: Radiology report(s) reviewed.    Psychometrics:    Social Drivers of Health with Concerns     Tobacco Use: Medium Risk (03/01/2024)    Patient History     Smoking Tobacco Use: Former     Smokeless Tobacco Use: Never     Passive Exposure: Never   Physical Activity: Not on file   Stress: Not on file   Substance Use: Not on file (12/14/2022)   Social Connections: Socially Isolated (02/09/2024)    Received from [facility]    Social Connection and Isolation Panel     Frequency of Communication with Friends and Family: Three times a week     Frequency of Social Gatherings with Friends and Family: Three times a week     How often do you attend church or religious services?: Never     Do you belong to any clubs or organizations such as church groups, unions, fraternal or athletic groups, or school groups?: No     How often do you attend meetings of the clubs or organizations you belong to?: Never     Marital Status: Divorced   Physicist, Medical Strain: Patient Declined (10/15/2023)    Overall Financial Resource Strain (CARDIA)     Difficulty of Paying Living Expenses: Patient declined   Recent Concern: Physicist, Medical Strain - Medium Risk (09/23/2023)    Received from [facility]    Overall Financial Resource Strain (CARDIA)     Difficulty of Paying Living Expenses: Somewhat hard   Health Literacy: Not on file   Internet Connectivity: Internet connectivity concern identified (10/15/2023)    Internet Connectivity     Do you have access to internet services: No     How do you connect to the internet: Not on file     Is your internet connection strong enough for you to watch video on your device without major problems?: Not on file     Do you have enough data to get through the month?: Not on file     Does at least one of the devices have a camera that you can use for video chat?: Not on file        ---  Time-based billing disclaimer:  I personally spent 20   minutes face-to-face and non-face-to-face in the care of this patient, which includes all pre, intra, and post visit time on the date of service.  All documented time was  specific to the E/M visit and does not include any procedures that may have been performed.

## 2024-03-11 NOTE — Plan of Care (Signed)
 Shift Summary  Safety interventions and behavioral plan were consistently implemented, and no unsafe or self-injurious behaviors were observed during the shift.   Nutritional intake included oral fluids and high-calorie supplements, but overall intake was still assessed as probably inadequate.  Infection prevention measures were maintained, including aseptic technique and hand hygiene, with no infection-related symptoms documented.  Lurasidone  was administered as scheduled, and PRN menthol  and guaiFENesin  were given for cough. 3/10 pain in chest noted yet oxycodone  deferred by pt at the time.   Overall, the shift was stable with adherence to safety protocols, nutritional support, and infection prevention measures.    Adheres to Safety Considerations for Self and Others: No self-injurious or unsafe behaviors were observed, and safety interventions such as fall reduction, environmental modifications, and elopement precautions were maintained throughout the shift; behavioral plan and boundaries were reinforced, and the environment remained safe and calm.    Improved Nutritional Intake: Oral intake included 240 mL P.O. and two nutrition supplements (Ensure Plus and Magic Cup) with 75% of breakfast consumed, but nutrition was still assessed as probably inadequate; able to feed self and supplement intake totaled 355 mL during the shift.    Absence of Infection Signs and Symptoms: Aseptic technique and hand hygiene were maintained, perineal hygiene was encouraged, and adhesive use was limited; no documentation of infection-related symptoms was noted during the shift.

## 2024-03-12 DIAGNOSIS — C3490 Malignant neoplasm of unspecified part of unspecified bronchus or lung: Principal | ICD-10-CM

## 2024-03-12 LAB — BASIC METABOLIC PANEL
ANION GAP: 13 mmol/L (ref 5–14)
BLOOD UREA NITROGEN: 25 mg/dL — ABNORMAL HIGH (ref 9–23)
BUN / CREAT RATIO: 32
CALCIUM: 9.1 mg/dL (ref 8.7–10.4)
CHLORIDE: 103 mmol/L (ref 98–107)
CO2: 26 mmol/L (ref 20.0–31.0)
CREATININE: 0.77 mg/dL (ref 0.55–1.02)
EGFR CKD-EPI (2021) FEMALE: 84 mL/min/{1.73_m2} (ref >=60)
GLUCOSE RANDOM: 142 mg/dL (ref 70–179)
POTASSIUM: 4.5 mmol/L (ref 3.4–4.8)
SODIUM: 142 mmol/L (ref 135–145)

## 2024-03-12 LAB — CBC W/ AUTO DIFF
BASOPHILS ABSOLUTE COUNT: 0.1 10*9/L (ref 0.0–0.1)
BASOPHILS RELATIVE PERCENT: 0.5 %
EOSINOPHILS ABSOLUTE COUNT: 0.2 10*9/L (ref 0.0–0.5)
EOSINOPHILS RELATIVE PERCENT: 1.3 %
HEMATOCRIT: 30.1 % — ABNORMAL LOW (ref 34.0–44.0)
HEMOGLOBIN: 10.3 g/dL — ABNORMAL LOW (ref 11.3–14.9)
LYMPHOCYTES ABSOLUTE COUNT: 1.1 10*9/L (ref 1.1–3.6)
LYMPHOCYTES RELATIVE PERCENT: 5.9 %
MEAN CORPUSCULAR HEMOGLOBIN CONC: 34.3 g/dL (ref 32.0–36.0)
MEAN CORPUSCULAR HEMOGLOBIN: 28.9 pg (ref 25.9–32.4)
MEAN CORPUSCULAR VOLUME: 84.3 fL (ref 77.6–95.7)
MEAN PLATELET VOLUME: 8 fL (ref 6.8–10.7)
MONOCYTES ABSOLUTE COUNT: 1.8 10*9/L — ABNORMAL HIGH (ref 0.3–0.8)
MONOCYTES RELATIVE PERCENT: 9.7 %
NEUTROPHILS ABSOLUTE COUNT: 15 10*9/L — ABNORMAL HIGH (ref 1.8–7.8)
NEUTROPHILS RELATIVE PERCENT: 82.6 %
PLATELET COUNT: 354 10*9/L (ref 150–450)
RED BLOOD CELL COUNT: 3.57 10*12/L — ABNORMAL LOW (ref 3.95–5.13)
RED CELL DISTRIBUTION WIDTH: 14.4 % (ref 12.2–15.2)
WBC ADJUSTED: 18.1 10*9/L — ABNORMAL HIGH (ref 3.6–11.2)

## 2024-03-12 LAB — PHOSPHORUS: PHOSPHORUS: 3.4 mg/dL (ref 2.4–5.1)

## 2024-03-12 LAB — BLOOD GAS, VENOUS
BASE EXCESS VENOUS: 3.7 — ABNORMAL HIGH (ref -2.0–2.0)
CARBOXYHEMOGLOBIN, VENOUS: 1.1 % (ref <1.2)
HCO3 VENOUS: 29 mmol/L — ABNORMAL HIGH (ref 22–27)
METHEMOGLOBIN, VENOUS: <1 % (ref <1.5)
O2 SATURATION VENOUS: 41.1 % (ref 40.0–85.0)
OXYHEMOGLOBIN, VENOUS: 40.6 % (ref 40.0–85.0)
PCO2 VENOUS: 45 mmHg (ref 40–60)
PH VENOUS: 7.42 (ref 7.32–7.43)
PO2 VENOUS: <31 mmHg (ref 30–55)

## 2024-03-12 LAB — COMPREHENSIVE METABOLIC PANEL
ALBUMIN: 3.1 g/dL — ABNORMAL LOW (ref 3.4–5.0)
ALKALINE PHOSPHATASE: 146 U/L — ABNORMAL HIGH (ref 46–116)
ALT (SGPT): 67 U/L — ABNORMAL HIGH (ref 10–49)
ANION GAP: 14 mmol/L (ref 5–14)
AST (SGOT): 51 U/L — ABNORMAL HIGH (ref <=34)
BILIRUBIN TOTAL: 0.3 mg/dL (ref 0.3–1.2)
BLOOD UREA NITROGEN: 18 mg/dL (ref 9–23)
BUN / CREAT RATIO: 18
CALCIUM: 9 mg/dL (ref 8.7–10.4)
CHLORIDE: 102 mmol/L (ref 98–107)
CO2: 23 mmol/L (ref 20.0–31.0)
CREATININE: 1 mg/dL (ref 0.55–1.02)
EGFR CKD-EPI (2021) FEMALE: 61 mL/min/{1.73_m2} (ref >=60)
GLUCOSE RANDOM: 115 mg/dL (ref 70–179)
POTASSIUM: 4.4 mmol/L (ref 3.4–4.8)
PROTEIN TOTAL: 6.9 g/dL (ref 5.7–8.2)
SODIUM: 139 mmol/L (ref 135–145)

## 2024-03-12 LAB — LACTATE, VENOUS, WHOLE BLOOD: LACTATE BLOOD VENOUS: 2 mmol/L — ABNORMAL HIGH (ref 0.5–1.8)

## 2024-03-12 LAB — MAGNESIUM: MAGNESIUM: 2.1 mg/dL (ref 1.6–2.6)

## 2024-03-12 MED ORDER — POLYETHYLENE GLYCOL 3350 17 GRAM ORAL POWDER PACKET
PACK | Freq: Every day | ORAL | 0 refills | 20.00000 days | Status: SS | PRN
Start: 2024-03-12 — End: 2024-04-11

## 2024-03-12 MED ORDER — MENTHOL LOZENGES WRAPPER
LOZENGE | ORAL | 0 refills | 5.00000 days | Status: SS | PRN
Start: 2024-03-12 — End: ?

## 2024-03-12 MED ORDER — MELATONIN 3 MG TABLET
ORAL_TABLET | Freq: Every evening | ORAL | 0 refills | 30.00000 days | Status: SS | PRN
Start: 2024-03-12 — End: ?

## 2024-03-12 MED ORDER — OXYCODONE 5 MG TABLET
ORAL_TABLET | ORAL | 0 refills | 4.00000 days | Status: SS | PRN
Start: 2024-03-12 — End: 2024-03-17

## 2024-03-12 MED ORDER — ATORVASTATIN 80 MG TABLET
ORAL_TABLET | Freq: Every day | ORAL | 3 refills | 90.00000 days | Status: SS
Start: 2024-03-12 — End: 2025-03-07

## 2024-03-12 MED ORDER — SENNOSIDES 8.6 MG TABLET
ORAL_TABLET | Freq: Every evening | ORAL | 0 refills | 30.00000 days | Status: SS | PRN
Start: 2024-03-12 — End: 2024-06-10

## 2024-03-12 MED ORDER — ALBUTEROL SULFATE HFA 90 MCG/ACTUATION AEROSOL INHALER
Freq: Four times a day (QID) | RESPIRATORY_TRACT | 0 refills | 0.00000 days | Status: SS | PRN
Start: 2024-03-12 — End: 2025-03-12

## 2024-03-12 MED ORDER — CARBOXYMETHYLCELLULOSE SODIUM 0.25 % EYE DROPS
Freq: Four times a day (QID) | OPHTHALMIC | 0 refills | 75.00000 days | Status: SS | PRN
Start: 2024-03-12 — End: ?

## 2024-03-12 MED ORDER — GUAIFENESIN 100 MG/5 ML ORAL LIQUID
ORAL | 0 refills | 2.00000 days | Status: SS | PRN
Start: 2024-03-12 — End: ?

## 2024-03-12 MED ORDER — QUETIAPINE 25 MG TABLET
ORAL_TABLET | Freq: Two times a day (BID) | ORAL | 0 refills | 30.00000 days | Status: SS | PRN
Start: 2024-03-12 — End: 2024-05-11

## 2024-03-12 MED ADMIN — traZODone (DESYREL) tablet 150 mg: 150 mg | ORAL | @ 01:00:00

## 2024-03-12 MED ADMIN — atorvastatin (LIPITOR) tablet 80 mg: 80 mg | ORAL | @ 14:00:00 | Stop: 2024-03-12

## 2024-03-12 MED ADMIN — thiamine mononitrate (vit B1) tablet 100 mg: 100 mg | ORAL | @ 14:00:00 | Stop: 2024-03-12

## 2024-03-12 MED ADMIN — pantoprazole (Protonix) EC tablet 20 mg: 20 mg | ORAL | @ 13:00:00 | Stop: 2024-03-12

## 2024-03-12 MED ADMIN — albuterol (PROVENTIL HFA;VENTOLIN HFA) 90 mcg/actuation inhaler 2 puff: 2 | RESPIRATORY_TRACT | @ 02:00:00

## 2024-03-12 MED ADMIN — albuterol (PROVENTIL HFA;VENTOLIN HFA) 90 mcg/actuation inhaler 2 puff: 2 | RESPIRATORY_TRACT | @ 13:00:00 | Stop: 2024-03-12

## 2024-03-12 MED ADMIN — senna (SENOKOT) tablet 1 tablet: 1 | ORAL | Stop: 2024-03-12

## 2024-03-12 MED ADMIN — mirtazapine (REMERON) tablet 30 mg: 30 mg | ORAL | @ 01:00:00

## 2024-03-12 MED ADMIN — lurasidone (LATUDA) tablet 40 mg: 40 mg | ORAL | Stop: 2024-03-12

## 2024-03-12 NOTE — Plan of Care (Signed)
 Shift Summary  Safety precautions were consistently implemented, including bed positioning, call light placement, and use of non-skid footwear throughout the shift.  Hourly visual checks and scheduled toileting were completed, with no falls or injuries documented.  Patient was able to turn self in bed, indicating some mobility.  Overall, safety and fall prevention goals were maintained during the shift.    Adheres to Safety Considerations for Self and Others: Safety measures were maintained throughout the shift, including call light within reach, bed in lowest position, bed wheels locked, and use of non-skid footwear; side rails were up 3/4 for added safety.    Absence of Fall and Fall-Related Injury: No falls or fall-related injuries occurred during the shift; hourly visual checks and scheduled toileting were performed, and the room door remained open for observation.

## 2024-03-13 ENCOUNTER — Ambulatory Visit: Admit: 2024-03-13 | Payer: Medicare (Managed Care)

## 2024-03-13 LAB — HEPATITIS C ANTIBODY
HCV S/CO VALUE: 0.13
HEPATITIS C ANTIBODY: NONREACTIVE

## 2024-03-13 LAB — BASIC METABOLIC PANEL
ANION GAP: 13 mmol/L (ref 5–14)
BLOOD UREA NITROGEN: 17 mg/dL (ref 9–23)
BUN / CREAT RATIO: 22
CALCIUM: 8.9 mg/dL (ref 8.7–10.4)
CHLORIDE: 105 mmol/L (ref 98–107)
CO2: 24 mmol/L (ref 20.0–31.0)
CREATININE: 0.76 mg/dL (ref 0.55–1.02)
EGFR CKD-EPI (2021) FEMALE: 85 mL/min/{1.73_m2} (ref >=60)
GLUCOSE RANDOM: 128 mg/dL (ref 70–179)
POTASSIUM: 4.4 mmol/L (ref 3.4–4.8)
SODIUM: 142 mmol/L (ref 135–145)

## 2024-03-13 LAB — CBC W/ AUTO DIFF
BASOPHILS ABSOLUTE COUNT: 0.1 10*9/L (ref 0.0–0.1)
BASOPHILS RELATIVE PERCENT: 0.6 %
EOSINOPHILS ABSOLUTE COUNT: 0.1 10*9/L (ref 0.0–0.5)
EOSINOPHILS RELATIVE PERCENT: 0.5 %
HEMATOCRIT: 29.8 % — ABNORMAL LOW (ref 34.0–44.0)
HEMOGLOBIN: 10.2 g/dL — ABNORMAL LOW (ref 11.3–14.9)
LYMPHOCYTES ABSOLUTE COUNT: 0.5 10*9/L — ABNORMAL LOW (ref 1.1–3.6)
LYMPHOCYTES RELATIVE PERCENT: 3.6 %
MEAN CORPUSCULAR HEMOGLOBIN CONC: 34.1 g/dL (ref 32.0–36.0)
MEAN CORPUSCULAR HEMOGLOBIN: 29.4 pg (ref 25.9–32.4)
MEAN CORPUSCULAR VOLUME: 86.2 fL (ref 77.6–95.7)
MEAN PLATELET VOLUME: 8.1 fL (ref 6.8–10.7)
MONOCYTES ABSOLUTE COUNT: 0.5 10*9/L (ref 0.3–0.8)
MONOCYTES RELATIVE PERCENT: 3.2 %
NEUTROPHILS ABSOLUTE COUNT: 13.2 10*9/L — ABNORMAL HIGH (ref 1.8–7.8)
NEUTROPHILS RELATIVE PERCENT: 92.1 %
PLATELET COUNT: 356 10*9/L (ref 150–450)
RED BLOOD CELL COUNT: 3.46 10*12/L — ABNORMAL LOW (ref 3.95–5.13)
RED CELL DISTRIBUTION WIDTH: 14 % (ref 12.2–15.2)
WBC ADJUSTED: 14.4 10*9/L — ABNORMAL HIGH (ref 3.6–11.2)

## 2024-03-13 LAB — HEPATIC FUNCTION PANEL
ALBUMIN: 2.9 g/dL — ABNORMAL LOW (ref 3.4–5.0)
ALKALINE PHOSPHATASE: 141 U/L — ABNORMAL HIGH (ref 46–116)
ALT (SGPT): 61 U/L — ABNORMAL HIGH (ref 10–49)
AST (SGOT): 39 U/L — ABNORMAL HIGH (ref <=34)
BILIRUBIN DIRECT: <0.1 mg/dL (ref 0.00–0.30)
BILIRUBIN TOTAL: 0.2 mg/dL — ABNORMAL LOW (ref 0.3–1.2)
PROTEIN TOTAL: 6.6 g/dL (ref 5.7–8.2)

## 2024-03-13 LAB — LIPID PANEL
CHOLESTEROL: 146 mg/dL (ref <200)
HDL CHOLESTEROL: 65 mg/dL (ref >50)
LDL CHOLESTEROL CALCULATED: 72 mg/dL (ref <100)
NON-HDL CHOLESTEROL: 81 mg/dL (ref <130)
TRIGLYCERIDES: 62 mg/dL (ref <150)

## 2024-03-13 LAB — MAGNESIUM: MAGNESIUM: 3.1 mg/dL — ABNORMAL HIGH (ref 1.6–2.6)

## 2024-03-13 LAB — PROTIME-INR
INR: 1.04
PROTIME: 11.9 s (ref 9.9–12.6)

## 2024-03-13 LAB — PHOSPHORUS: PHOSPHORUS: 4.3 mg/dL (ref 2.4–5.1)

## 2024-03-13 LAB — HEPATITIS B SURFACE ANTIBODY: HEPATITIS B SURFACE ANTIBODY QUANT: 9 m[IU]/mL — ABNORMAL HIGH (ref <8.00)

## 2024-03-13 LAB — HEPATITIS A ANTIBODY, IGM: HEPATITIS A IGM ANTIBODY: NONREACTIVE

## 2024-03-13 LAB — FERRITIN: FERRITIN: 32.9 ng/mL (ref 30.0–270.7)

## 2024-03-13 LAB — HEPATITIS B CORE ANTIBODY, IGM: HEPATITIS B CORE IGM ANTIBODY: NONREACTIVE

## 2024-03-13 LAB — HEPATITIS B CORE ANTIBODY, TOTAL: HEPATITIS B CORE TOTAL ANTIBODY: NONREACTIVE

## 2024-03-13 LAB — IRON PANEL
IRON SATURATION: 6 % — ABNORMAL LOW (ref 20–55)
IRON: 18 ug/dL — ABNORMAL LOW (ref 50–170)
TOTAL IRON BINDING CAPACITY: 301 ug/dL (ref 250–425)

## 2024-03-13 LAB — HEPATITIS B SURFACE ANTIGEN: HEPATITIS B SURFACE ANTIGEN: NONREACTIVE

## 2024-03-13 LAB — HEPATITIS A IGG: HEPATITIS A IGG: NONREACTIVE

## 2024-03-13 MED ORDER — THIAMINE MONONITRATE (VITAMIN B1) 100 MG TABLET
ORAL_TABLET | Freq: Every day | ORAL | 0 refills | 30.00000 days | Status: SS
Start: 2024-03-13 — End: 2024-04-12

## 2024-03-13 MED ORDER — IPRATROPIUM 0.5 MG-ALBUTEROL 3 MG (2.5 MG BASE)/3 ML NEBULIZATION SOLN
Freq: Four times a day (QID) | RESPIRATORY_TRACT | 0 refills | 1.00000 days | Status: SS
Start: 2024-03-13 — End: ?

## 2024-03-13 MED ORDER — LURASIDONE 40 MG TABLET
ORAL_TABLET | Freq: Every day | ORAL | 0 refills | 90.00000 days | Status: SS
Start: 2024-03-13 — End: 2024-06-11

## 2024-03-13 MED ORDER — PANTOPRAZOLE 20 MG TABLET,DELAYED RELEASE
ORAL_TABLET | Freq: Every day | ORAL | 0 refills | 90.00000 days | Status: SS
Start: 2024-03-13 — End: 2024-06-11

## 2024-03-13 MED ORDER — MIRTAZAPINE 30 MG TABLET
ORAL_TABLET | Freq: Every evening | ORAL | 0 refills | 90.00000 days | Status: SS
Start: 2024-03-13 — End: 2024-06-11

## 2024-03-13 MED ADMIN — traZODone (DESYREL) tablet 150 mg: 150 mg | ORAL | @ 01:00:00 | Stop: 2024-03-12

## 2024-03-13 MED ADMIN — cefTRIAXone (ROCEPHIN) 2 g in sodium chloride 0.9 % (NS) 100 mL IVPB-MBP: 2 g | INTRAVENOUS | @ 06:00:00 | Stop: 2024-03-17

## 2024-03-13 MED ADMIN — bisacodyl (DULCOLAX) suppository 10 mg: 10 mg | RECTAL | Stop: 2024-03-12

## 2024-03-13 MED ADMIN — thiamine mononitrate (vit B1) tablet 100 mg: 100 mg | ORAL | @ 14:00:00

## 2024-03-13 MED ADMIN — oxyCODONE (ROXICODONE) immediate release tablet 2.5 mg: 2.5 mg | ORAL | @ 01:00:00 | Stop: 2024-03-12

## 2024-03-13 MED ADMIN — budesonide (PULMICORT) nebulizer solution 0.5 mg: .5 mg | RESPIRATORY_TRACT | @ 14:00:00

## 2024-03-13 MED ADMIN — budesonide (PULMICORT) nebulizer solution 0.5 mg: .5 mg | RESPIRATORY_TRACT | @ 06:00:00

## 2024-03-13 MED ADMIN — lurasidone (LATUDA) tablet 40 mg: 40 mg | ORAL | @ 23:00:00

## 2024-03-13 MED ADMIN — albuterol (PROVENTIL HFA;VENTOLIN HFA) 90 mcg/actuation inhaler 2 puff: 2 | RESPIRATORY_TRACT | @ 01:00:00 | Stop: 2024-03-12

## 2024-03-13 MED ADMIN — iohexol (OMNIPAQUE) 350 mg iodine/mL solution 75 mL: 75 mL | INTRAVENOUS | @ 07:00:00 | Stop: 2024-03-13

## 2024-03-13 MED ADMIN — ipratropium-albuterol (DUO-NEB) 0.5-2.5 mg/3 mL nebulizer solution 3 mL: 3 mL | RESPIRATORY_TRACT | @ 03:00:00 | Stop: 2024-03-12

## 2024-03-13 MED ADMIN — ipratropium-albuterol (DUO-NEB) 0.5-2.5 mg/3 mL nebulizer solution 3 mL: 3 mL | RESPIRATORY_TRACT | @ 21:00:00

## 2024-03-13 MED ADMIN — ipratropium-albuterol (DUO-NEB) 0.5-2.5 mg/3 mL nebulizer solution 3 mL: 3 mL | RESPIRATORY_TRACT | @ 11:00:00

## 2024-03-13 MED ADMIN — ipratropium-albuterol (DUO-NEB) 0.5-2.5 mg/3 mL nebulizer solution 3 mL: 3 mL | RESPIRATORY_TRACT | @ 06:00:00

## 2024-03-13 MED ADMIN — mirtazapine (REMERON) tablet 30 mg: 30 mg | ORAL | @ 01:00:00 | Stop: 2024-03-12

## 2024-03-13 MED ADMIN — QUEtiapine (SEROQUEL) tablet 12.5 mg: 12.5 mg | ORAL | @ 01:00:00 | Stop: 2024-03-12

## 2024-03-13 MED ADMIN — predniSONE (DELTASONE) tablet 40 mg: 40 mg | ORAL | @ 06:00:00 | Stop: 2024-03-18

## 2024-03-13 MED ADMIN — azithromycin (ZITHROMAX) tablet 500 mg: 500 mg | ORAL | @ 06:00:00 | Stop: 2024-03-15

## 2024-03-13 MED ADMIN — senna (SENOKOT) tablet 2 tablet: 2 | ORAL | @ 16:00:00

## 2024-03-13 MED ADMIN — multivitamins, therapeutic with minerals tablet 1 tablet: 1 | ORAL | @ 14:00:00

## 2024-03-13 MED ADMIN — pantoprazole (Protonix) EC tablet 20 mg: 20 mg | ORAL | @ 12:00:00

## 2024-03-13 MED ADMIN — acetaminophen (TYLENOL) tablet 650 mg: 650 mg | ORAL | @ 07:00:00

## 2024-03-13 MED ADMIN — cyanocobalamin (vitamin B-12) tablet 1,000 mcg: 1000 ug | ORAL | @ 14:00:00

## 2024-03-13 MED ADMIN — polyethylene glycol (MIRALAX) packet 17 g: 17 g | ORAL | @ 14:00:00 | Stop: 2024-03-13

## 2024-03-13 MED ADMIN — ferrous sulfate tablet 325 mg: 325 mg | ORAL | @ 14:00:00 | Stop: 2024-03-13

## 2024-03-13 MED ADMIN — magnesium sulfate 2gm/50mL IVPB: 2 g | INTRAVENOUS | @ 07:00:00 | Stop: 2024-03-13

## 2024-03-13 NOTE — Advance Care Planning (Signed)
 ADVANCE CARE PLANNING NOTE    Discussion Date:  March 13, 2024    Patient has decisional capacity:  Yes    Patient has selected a Health Care Decision-Maker if loses capacity: Yes    Health Care Decision Maker as of 03/13/2024    HCDM (patient stated preference): Adolm Rocker - Daughter - [phone]    Discussion Participants:  Ms. Alynah Schone and her daughter Duwaine  Palliative care: Izetta Kaiser, NP    Communication of Medical Status/Prognosis:   Today [name] and her daughter Rocker  share understanding [name] now has a new diagnosis of non-small cell lung cancer complicated by underlying COPD, major depressive disorder, weight loss/frailty, and persistent dyspnea/dry cough.    They understand from Dr. Pecot she could be a candidate for newer disease directed therapy.  Available options depend on further tumor workup/(Tempus)testing, which typically takes about 3 weeks.  Therefore, prognosis with treatment is somewhat uncertain.    Without treatment prognosis is estimated  3 to 6 months.      Communication of Treatment Goals/Options:   Today [name] conveys a strong reluctance to pursue disease directed therapy.    -- She states she has been through multiple cancer therapies in the past and and currently feels her body is too weak and fragile to tolerate the side effects of another cancer treatment.    -- She has the lived experience of watching her father die of an incurable colon cancer.  She watched his suffering, and vowed she would never want that for herself.    --Furthermore, she has strong feelings about not wanting to be a burden to her daughters, who she now relies on for caregiver support.    Today we discussed potential treatment pathways:  1.  If goals are to focus on extending life,  her oncology team will support helping her reach a point of hospital discharge and set up outpatient follow-up with oncology for further evaluation of systemic treatment options.  She is also a potential candidate for radiation, with the goal of helping reduce dyspnea symptom burden. (Waiting additional input from radiation oncology).  2.  We also discussed that she always has the option to step away from intensive life extending interventions and transition to treatment that focuses more exclusively on comfort and symptom management.  She and daughter are familiar with hospice, and today we discussed some of the nuances of inpatient versus home hospice.     Given [name]'s statements about wanting to avoid intensive interventions, we also discussed the risks, benefits, and potential outcomes of CPR in the setting of advanced cancer.  I explained that if we did not attempt CPR that this would mean allowing for a natural death. I also reassured [name]  that it would not limit other treatment options that align with his goals.       Treatment Decisions:   -Today's visit was primarily infomation sharing.  No decisions were made,  as  [name] and her daughter Rocker will  time to process  before making decisions. Meagan made it clear to [facility] that she and her sisters  will support either pathway.  Their primary goal is to support [name]'s  preferences and values.    I spent 60  minutes providing voluntary advance care planning services for this patient.

## 2024-03-13 NOTE — Consults (Addendum)
 PHYSICAL THERAPY  Evaluation (03/13/24 1036)          Patient Name:  Christy Buchanan       Medical Record Number: [id number]   Date of Birth: 11-10-1955  Sex: Female        Post-Discharge Physical Therapy Recommendations:  PT Post Acute Discharge Recommendations: 3x weekly, Community Ambulator   Discharge transport recommendations : Ambulatory  Equipment Recommendation  PT DME Recommendations: None          Treatment Diagnosis: Generalized muscle weakness, Abnormalities of gait and mobility        ASSESSMENT  Problem List: Decreased endurance, Dizziness/vertigo, At risk for deconditioning      Assessment : Christy Buchanan is a 69 y.o. 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 Pt seen for PT eval with the above deficits and below her functional baseline. Pt able to participate in bed mobility, transfers, and hallway level mobility with no device and SBA due to increased fatigue and WOB. Noticeable increased WOB with hallway level mobility despite stable vitals. Pt primarily limited by decreased endurance and decreased activity tolerance. Pt will continue to benefit from skilled acute PT services at a low frequency to prevent deconditioning. After a review of the personal factors, comorbidities, clinical presentation, and examination of the number of affected body systems, the patient presents as a moderate complexity case.      Today's Interventions: PT eval, AMPAC, graded mobility as stated below, Pt education: PT role/POC, safe progression of mobility, importance of continued mobility, fall prevention, up with assist from staff, activity pacing with PLBing     Personal Factors/Comorbidities Present: 3+ factors   Examination of Body systems: 4+ elements  Clinical Presentation: Evolving    Eval Complexity : Moderate Complexity     Activity Tolerance: Limited by fatigue, Tolerated treatment well       PLAN  Planned Frequency of Treatment: Plan of Care Initiated: 03/13/24  1-2x per day Weekly Frequency: 2-3 days per week  Planned Treatment Duration: 03/27/24     Planned Interventions: Education (Patient/Family/Caregiver), Gait training, Self-care / Home Management training, Therapeutic Exercise, Home exercise program, Therapeutic Activity, Neuromuscular re-education     Goals:   Patient and Family Goals: none stated at this time.     SHORT GOAL #1: Pt will be able to ambulate 150' with LRAD and mod I with reported RPE <4/10               Time Frame : 2 weeks          Long Term Goal #1: Pt will score 24/24 on the AMPAC  Time Frame: 2 months     Prognosis:  Good  Positive Indicators: relatively high reported PLOF, and assessed CLOF  Barriers to Discharge: None     SUBJECTIVE  Communication Preference: Verbal     Patient reports: Pt agreeable to PT  Pain Comments: Pt denies pain, reports discomfort in abdomin but not painful, activity modified to tolerance        Prior Functional Status: Pt reports independent with all mobility and ADLs with no device. 1 Fall several months ago when in the kitchen but able to  get up by herself. Finds calming instramental music helps when she is feeling anxious.  Living Situation  Living Environment: House  Lives With: Alone  Home Living: One level home, Stairs to enter with rails, Tub/shower unit, Standard height toilet, Grab bars in shower  Rail placement (outside): Bilateral rails in reach  Number of Stairs to Enter (outside): 2  Caregiver Identified?: No  Caregiver Identified?: No   Equipment available at home: Straight cane        Past Medical History[1]         Social History     Tobacco Use    Smoking status: Former     Types: Cigarettes     Passive exposure: Never    Smokeless tobacco: Never   Substance Use Topics    Alcohol use: Not Currently     Comment: rarely       Past Surgical History[2]          Family History[3]     Allergies: Escitalopram           Objective Findings  Precautions / Restrictions  Precautions: Psych Safety precautions, Suicide precautions  Required Braces or Orthoses: Non-applicable        Equipment / Environment: Vascular access (PIV, TLC, Port-a-cath, PICC), Supplemental oxygen, Telemetry     Vitals/Orthostatics : SpO2 >90% on 2 L [place]     Cognition: WFL  Cognition comment: pleasant and cooperative  Orientation:  (not formally assessed)  Visual/Perception: Wears Glasses/Contacts all the time  Hearing: No deficit identified     Skin Inspection: Intact where visualized     Upper Extremities  UE ROM: Left WFL, Right WFL  UE Strength: Right WFL, Left WFL    Lower Extremities  LE ROM: Right WFL, Left WFL  LE Strength: Left WFL, Right WFL    Face, Cervical and Trunk ROM  Cervical ROM: WFL     Coordination: Not tested  Sensation: Impaired (peripheral neuropathy, chronic)    Static Sitting-Level of Assistance: Independent  Dynamic Sitting-Level of Assistance: Independent    Static Standing-Level of Assistance: Independent  Dynamic Standing - Level of Assistance: Supervision  Standing Balance comments: no device      Bed Mobility      Supine to Sit assistance level: Independent     Bed Mobility comments: supine to/from sit: indep    Transfers  Sit to Stand assistance level: Independent     Transfer comments: no device    Ambulation  Level of Assistance: Standby assist, set-up cues, supervision of patient - no hands on  Assistive Device: Other (Comment) (none)   Distance Ambulated (ft): 80 ft   Ambulation comments: Pt able to ambulate in room with supervision and no device. Pt able to progress to hallway level mobility with no device and supervision progressing to SBA due to increased fatigue and WOB despite stable vitals. VCs for activity pacing and PLBing. No LOB or instability noted. Pt reporting 10/10 fatigue after hallway level mobility.      Stairs:NT        Patient at end of session: All needs in reach, In bed, Lines intact, Notified Nurse, Staff present psychiatrist present as well as medical team)        AM-PAC-6 click  Help currently need turning over In bed?: None - Modified Independent/Independent  Help currently needed sitting down/standing up from chair with arms? : None - Modified Independent/Independent  Help currently needed moving from supine to sitting on edge of bed?: None - Modified Independent/Independent  Help currently needed moving to and from bed from wheelchair?: None - Modified Independent/Independent  Help currently needed walking in a hospital room?: None - Modified Independent/Independent  Help currently needed climbing 3-5 steps with railing?: A Little - Minimal/Contact Guard Assist/Supervision    Basic Mobility Score 6 click: 23    6 click Score (in points): % of Functional Impairment, Limitation, Restriction  6: 100% impaired, limited, restricted  7-8: At least 80%, but less than 100% impaired, limited restricted  9-13: At least 60%, but less than 80% impaired, limited restricted  14-19: At least 40%, but less than 60% impaired, limited restricted  20-22: At least 20%, but less than 40% impaired, limited restricted  23: At least 1%, but less than 20% impaired, limited restricted  24: 0% impaired, limited restricted    'AM-PAC' forms are Copyright protected by [employer]     Physical Therapy Session Duration  PT Individual [mins]: 16  PT Co-Treatment [mins]:  (Sam G, OT)  Reason for Co-treatment: Poor activity tolerance    I attest that I have reviewed the above information.  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    Placed 11.2023  [place]  150 is setting 11.2023    Vitamin D deficiency    [2]   Past Surgical History:  Procedure Laterality Date    AUGMENTATION MAMMAPLASTY Bilateral     24ish yrs    BREAST BIOPSY Left     malignant    CHEMOTHERAPY      MASTECTOMY Left 25 ish yrs ago    PR BRNCHSC EBUS GUIDED SAMPL 1/2 NODE STATION/STRUX N/A 03/08/2024    Procedure: BRONCH, RIGID OR FLEXIBLE, INC FLUORO GUIDANCE, WHEN PERFORMED; WITH EBUS GUIDED TRANSTRACHEAL AND/OR TRANSBRONCHIAL SAMPLING, ONE OR TWO MEDIASTINAL AND/OR HILAR LYMPH NODE STATIONS OR STRUCTURES;  Surgeon: Fontaine Selinda Beam, MD;  [facility];  Service: Pulmonary    PR BRONCHOSCOPY W/THER ASPIR TRACHBRNCL TREE 1ST Right 03/08/2024    Procedure: BRONCHOSCPY, RIGID OR FLEXIBLE, W/FLUORO; WITH THERAPEUTIC ASPIRATION OF TRACHEOBRONCHIAL TREE, INITIAL;  Surgeon: Fontaine Selinda Beam, MD;  Location: [facility];  Service: Pulmonary    PR BRONCHOSCOPY,DIAGNOSTIC W LAVAGE Right 03/08/2024    Procedure: BRONCHOSCOPY, RIGID OR FLEXIBLE, INCLUDE FLUOROSCOPIC GUIDANCE WHEN PERFORMED; W/BRONCHIAL ALVEOLAR LAVAGE;  Surgeon: Fontaine Selinda Beam, MD;  Location: [facility];  Service: Pulmonary    PR BRONCHOSCOPY,EXCIS LESN Right 03/08/2024    Procedure: BRONCHOSCOPY, RIGID OR FLEXIBLE, INCLUDING FLUOROSCOPIC GUIDANCE WHEN PERFORMED; WITH EXCISION OF TUMOR;  Surgeon: Fontaine Selinda Beam, MD;  Location: [facility];  Service: Pulmonary    PR BRONCHOSCOPY,TRANSBRON ASPIR BX Right 03/08/2024    Procedure: BRONCHOSCOPY, RIGID/FLEX, INCL FLUORO; W/TRANSBRONCH NDL ASPIRAT BX, TRACHEA, MAIN STEM &/OR LOBAR BRONCHUS;  Surgeon: Fontaine Selinda Beam, MD;  Location: [facility];  Service: Pulmonary    PR CREATE SHUNT:VENTRIC-PERITONEAL Right 12/30/2021    Procedure: CREAT SHUNT; VENTRICULO-PERITONEAL/PLEURAL;  Surgeon: Kari Donnice Gaster, MD;  Location: MAIN OR [facility];  Service: Neurosurgery    PR LAP, SURG ENTEROLYSIS Right 12/30/2021    Procedure: LAPAROSCOPY, SURGICAL, ENTEROLYSIS (FREEING OF INTESTINAL ADHESION) (SEPARATE PROCEDURE);  Surgeon: Carlin Curtistine Route, MD;  Location: MAIN OR [facility];  Service: Trauma    RADIATION Left    [3]   Family History  Problem Relation Age of Onset    Cancer Mother     Heart disease Mother     Colon cancer Father     Cancer Father     No Known Problems Sister     No Known Problems Brother     BRCA 1/2 Daughter     BRCA 1/2 Daughter     BRCA 1/2 Daughter     No Known Problems Son     No Known Problems Maternal Grandmother     No Known Problems Maternal Grandfather     No Known Problems Paternal Grandmother     No Known Problems Paternal Grandfather     No Known Problems Other     Breast cancer Neg Hx     Endometrial cancer Neg Hx     Ovarian cancer Neg Hx

## 2024-03-13 NOTE — Plan of Care (Signed)
 Pt A&O x4 and vitals were closely monitored on this shift. Pt complained of a headache and prn tylenol  was provided. Pt denied SOB and was on 2L [place] overnight. Pt was on scheduled neb treatment. Pt had 1:1 sitter overnight and maintained safety- no acute events occurred on this shift.

## 2024-03-13 NOTE — Consults (Signed)
 WOCN Consult Services                                                                 Wound Evaluation     Reason for Consult:   - Initial  - Wound    Problem List:   Principal Problem:    Acute hypoxic respiratory failure    (CMS-HCC)  Active Problems:    Gastroesophageal reflux disease    History of breast cancer    Mixed hyperlipidemia    Postmenopausal osteoporosis    Pure hypercholesterolemia    Essential thrombocythemia    (CMS-HCC)    Acquired hypothyroidism    JAK-2 gene mutation    MPN (myeloproliferative neoplasm)    (CMS-HCC)    NPH (normal pressure hydrocephalus) (CMS-HCC)    Ventriculo-peritoneal shunt status    MCI (mild cognitive impairment) with memory loss    Severe episode of recurrent major depressive disorder, without psychotic features    (CMS-HCC)    Bipolar II disorder    (CMS-HCC)    Wrist laceration, left, sequela    Anxiety    Chronic obstructive pulmonary disease with acute exacerbation    (CMS-HCC)    Pneumonia    NSCLC of right lung (CMS-HCC)    Insomnia    History of suicide attempt    Assessment: Per EMR, Christy Buchanan is a 69 y.o. 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      We were consulted for a lself-inflicted left wrist wound from a previous suicide attempt that had been improving. It has now entirely epithelialized - her left wrist is intact and may remain open to air. Assessment limited to LUE. We will sign off at this time. Lab Results   Component Value Date    WBC 14.4 (H) 03/13/2024    HGB 10.2 (L) 03/13/2024    HCT 29.8 (L) 03/13/2024    CRP <4.0 03/02/2023    A1C 5.3 05/29/2022    GLUF 107 (H) 12/16/2023    GLU 128 03/13/2024    ALBUMIN 2.9 (L) 03/13/2024    PROT 6.6 03/13/2024      Support Surface:   - Low Air Loss    Offloading:  Left: Pillow  Right: Pillow    Type Debridement Completed By WOCN:  N/A    Teaching:  - Wound care    WOCN Recommendations:   - The patient's left wrist wound has healed and may remain open to air.    WOCN Follow Up:  - We will sign off at this time    Plan of Care Discussed With:   - Patient  - RN primary    Supplies Ordered: No    Workup Time:   30 minutes    Elsie Batty RN BSN  Wound, Ostomy and Continence Consult Service

## 2024-03-13 NOTE — Consults (Signed)
 Case Management Brief Assessment      General:  Care Manager / Social Worker assessed the patient by : In person interview with patient    Extended Emergency Contact Information  Primary Emergency Contact: Vaughn,Meagan  Mobile Phone: [phone]  Relation: Daughter  Interpreter needed? No  Secondary Emergency Contact: Waycott,Sarah  Mobile Phone: [phone]  Relation: Daughter      Financial Information:  Need for financial assistance?: No     Discharge Needs:  Concerns to be Addressed: discharge planning    Discharge Plan:  Screen findings are: Discharge planning needs identified or anticipated (Comment).    Estimated Discharge Date: 03/16/2024    Initial Assessment complete?: Yes    Additional Information:    HCDM (patient stated preference): Adolm Rocker - Daughter - [phone]    Social Drivers of Health     Food Insecurity: No Food Insecurity (02/09/2024)    Received from [facility]    Hunger Vital Sign     Within the past 12 months, you worried that your food would run out before you got the money to buy more.: Never true     Within the past 12 months, the food you bought just didn't last and you didn't have money to get more.: Never true   Tobacco Use: Medium Risk (03/13/2024)    Patient History     Smoking Tobacco Use: Former     Smokeless Tobacco Use: Never     Passive Exposure: Never   Transportation Needs: No Transportation Needs (02/09/2024)    Received from [facility] - Transportation     In the past 12 months, has lack of transportation kept you from medical appointments or from getting medications?: No     In the past 12 months, has lack of transportation kept you from meetings, work, or from getting things needed for daily living?: No   Alcohol Use: Not At Risk (02/29/2024)    Alcohol Use     How often do you have a drink containing alcohol?: Never     How many drinks containing alcohol do you have on a typical day when you are drinking?: 1 - 2     How often do you have 5 or more drinks on one occasion?: Never   Housing: Low Risk (10/15/2023)    Housing     Within the past 12 months, have you ever stayed: outside, in a car, in a tent, in an overnight shelter, or temporarily in someone else's home (i.e. couch-surfing)?: No     Are you worried about losing your housing?: No   Physical Activity: Not on file   Utilities: Not At Risk (02/09/2024)    Received from [facility] Utilities     In the past 12 months has the electric, gas, oil, or water company threatened to shut off services in your home?: No   Stress: Not on file   Interpersonal Safety: Not At Risk (02/29/2024)    Interpersonal Safety     Unsafe Where You Currently Live: No     Physically Hurt by Anyone: No     Abused by Anyone: No   Substance Use: Not on file (12/14/2022)   Intimate Partner Violence: Not At Risk (02/09/2024)    Received from [facility]    Humiliation, Afraid, Rape, and Kick questionnaire     Within the last year, have you been afraid of your partner or ex-partner?: No     Within the  last year, have you been humiliated or emotionally abused in other ways by your partner or ex-partner?: No     Within the last year, have you been kicked, hit, slapped, or otherwise physically hurt by your partner or ex-partner?: No     Within the last year, have you been raped or forced to have any kind of sexual activity by your partner or ex-partner?: No   Social Connections: Socially Isolated (02/09/2024)    Received from [facility]    Social Connection and Isolation Panel     Frequency of Communication with Friends and Family: Three times a week     Frequency of Social Gatherings with Friends and Family: Three times a week     How often do you attend church or religious services?: Never     Do you belong to any clubs or organizations such as church groups, unions, fraternal or athletic groups, or school groups?: No     How often do you attend meetings of the clubs or organizations you belong to?: Never     Marital Status: Divorced   Physicist, Medical Strain: Patient Declined (10/15/2023)    Overall Financial Resource Strain (CARDIA)     Difficulty of Paying Living Expenses: Patient declined   Recent Concern: Financial Resource Strain - Medium Risk (09/23/2023)    Received from [facility]    Overall Financial Resource Strain (CARDIA)     Difficulty of Paying Living Expenses: Somewhat hard   Health Literacy: Not on file   Internet Connectivity: Internet connectivity concern identified (10/15/2023)    Internet Connectivity     Do you have access to internet services: No     How do you connect to the internet: Not on file     Is your internet connection strong enough for you to watch video on your device without major problems?: Not on file     Do you have enough data to get through the month?: Not on file     Does at least one of the devices have a camera that you can use for video chat?: Not on file       Predictive Model Details          24% (High)  Factor Value    Calculated 03/13/2024 12:05 26% Number of active inpatient medication orders 45    [facility] Risk of Unplanned Readmission Model 9% Diagnosis of cancer present     8% Active antipsychotic inpatient medication order present     8% ECG/EKG order present in last 6 months     7% Charlson Comorbidity Index 7     7% Encounter of ten days or longer in last year present     6% Imaging order present in last 6 months     5% Latest hemoglobin low (10.2 g/dL)     5% Phosphorous result present     5% Number of ED visits in last six months 1     5% Age 68     4% Number of hospitalizations in last year 1     4% Active corticosteroid inpatient medication order present     2% Future appointment scheduled     1% Active ulcer inpatient medication order present     0% Current length of stay 0.533 days       Jon Hanly, MSW  Inpatient Social Worker  [phone]

## 2024-03-13 NOTE — Plan of Care (Signed)
 Pt A&Ox4, modified independent. VSS. 1:1 sitter at bedside. Suicide precautions maintained. Daughter came to visit. 2L [place], cont O2- no calls from tele. Psych, rad/onc following. Psych, onc team, and charge are all okay with pt having their clothes, books, phone as long as sitter remains at bedside. Nebs- pt refusing a couple, educated on importance. PT/OT eval. Pt reports constipation, bowel reg. Increased. Emotional and educational support provided.         Problem: Adult Inpatient Plan of Care  Goal: Absence of Hospital-Acquired Illness or Injury  Intervention: Identify and Manage Fall Risk  Recent Flowsheet Documentation  Taken 03/13/2024 9077 by Doyal Carlyon HERO, RN  Safety Interventions:   aspiration precautions   environmental modification   fall reduction program maintained   lighting adjusted for tasks/safety   low bed   nonskid shoes/slippers when out of bed   room near unit station   sitter at bedside  Intervention: Prevent Skin Injury  Recent Flowsheet Documentation  Taken 03/13/2024 9077 by Doyal Carlyon HERO, RN  Positioning for Skin: Supine/Back  Intervention: Prevent Infection  Recent Flowsheet Documentation  Taken 03/13/2024 9077 by Doyal Carlyon HERO, RN  Infection Prevention:   cohorting utilized   hand hygiene promoted     Problem: Infection  Goal: Absence of Infection Signs and Symptoms  Intervention: Prevent or Manage Infection  Recent Flowsheet Documentation  Taken 03/13/2024 9077 by Doyal Carlyon HERO, RN  Infection Management: aseptic technique maintained     Problem: Fall Injury Risk  Goal: Absence of Fall and Fall-Related Injury  Intervention: Promote Injury-Free Environment  Recent Flowsheet Documentation  Taken 03/13/2024 9077 by Doyal Carlyon HERO, RN  Safety Interventions:   aspiration precautions   environmental modification   fall reduction program maintained   lighting adjusted for tasks/safety   low bed   nonskid shoes/slippers when out of bed   room near unit station   sitter at bedside

## 2024-03-13 NOTE — Consults (Signed)
 Adult Nutrition Assessment Note    Visit Type: MD Consult  Reason for Visit: Assessment (Nutrition)    NUTRITION INTERVENTIONS and RECOMMENDATION     Added prune juice BID next 2 days (Discussed have staff warm in microwave)  Adjusted supplements Ensure plus high prot BID (vanilla/chocolate) and magic cup once daily (choc/vanilla)  Encouraged balanced diet and hydration  Continue assist Pt with meal ordering PRN    NUTRITION ASSESSMENT     Vitamins are beneficial to continue in setting of malnutrition dx  Senna med in place for reported constipation. Pt planned for nutritional intervention  Pt nutritional status impacted by chronic disease and psych hx  Pt would benefit from supportive nutrition care    NUTRITIONALLY RELEVANT DATA     HPI & PMH:   Per record, Christy Buchanan is a 69 y.o. female who is presenting to 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     Principal Problem:    Acute hypoxic respiratory failure    (CMS-HCC)    Nutrition History:   RD Visits from 03/01/24 to 03/09/24, Pt has had low oral intake due to procedures and meds taken.    RD Visit today, Pt reports her oral intake was starting to increase but with change of weather, she has change intake and became constipated. She reports last BM 2 days ago. She is having some flatus. Ensure supplement is too frequent. We discussed adjustments. She reports drinking WNL.     Medications:  Nutritionally pertinent medications reviewed and evaluated for potential food and/or medication interactions.     Labs:   Nutritionally pertinent labs reviewed.     Nutritional Needs:   Healthy balance of carbohydrate, protein, and fat.     Anthropometric Data:  Height:     Admission weight:    Last recorded weight: (not recorded)  IBW:    BMI: There is no height or weight on file to calculate BMI.   Usual Body Weight: Unable to obtain at this time   Weight Assessment: 14.7% loss from 03/02/23 to 03/09/24;  4% increase from 03/03/24 to 03/09/24    Wt Readings from Last 12 Encounters:   03/09/24 47.2 kg (104 lb 1.6 oz)   03/03/24 45.4 kg (100 lb)   02/24/24 45.4 kg (100 lb)   01/31/24 46.1 kg (101 lb 9.6 oz)   01/03/24 46.9 kg (103 lb 6.4 oz)   11/23/23 49.4 kg (108 lb 14.5 oz)   10/28/23 51.7 kg (114 lb)   08/24/23 51.7 kg (113 lb 15.7 oz)   05/25/23 53.4 kg (117 lb 11.6 oz)   04/27/23 52 kg (114 lb 11.2 oz)   03/02/23 54.8 kg (120 lb 13 oz)   12/24/22 55.5 kg (122 lb 6.4 oz)         Malnutrition Assessment:  Malnutrition Assessment using AND/ASPEN or GLIM Clinical Characteristics:            GLIM Severe Malnutrition (03/13/24 1239)  Low Body Mass Index: <18.5 if < 70 years  Reduced Muscle Mass: Severe deficit  Reduced Food Intake: Any reduction during > 2 weeks  Inflammation: Chronic disease related    Nutrition Focused Physical Exam:  Nutrition Focused Physical  Exam:  Fat Areas Examined  Orbital: Moderate loss  Upper Arm: Mild loss  Thoracic: Moderate loss      Muscle Areas Examined  Temple: Moderate loss  Clavicle: Severe loss  Acromion: Severe loss  Scapular: Moderate loss  Dorsal Hand: Moderate loss  Patellar: Severe loss  Anterior Thigh: Severe loss  Posterior Calf: Severe loss              Nutrition Evaluation  Nutrition Designation: Underweight (BMI < 18.50  kg/m2) (03/13/24 1238)     Care plan:  Completed    Current Nutrition:  Oral intake   Nutrition Orders            Supplement Adult; Ensure Plus High Protein (High Calorie/High Protein); # of Products PER Serving: 1 4xd PCHS starting at 02/02 0900 Nutrition Therapy Regular/House starting at 02/01 2331          Nutritionally Pertinent Allergies, Intolerances, Sensitivities, and/or Cultural/Religious Restrictions:  none identified at this time     GOALS and EVALUATION     Patient to meet 75% or greater of nutritional needs via combination of meals, snacks, and/or oral supplements within admission.  - New    Motivation, Barriers, and Compliance:  Evaluation of motivation, barriers, and compliance completed. No concerns identified at this time.     Discharge Planning:   Monitor via CAPP rounds for any discharge planning needs.  Monitor for potential discharge needs with multi-disciplinary team.          Follow-Up Parameters:   1-2 times per week (and more frequent as indicated)    Asberry Rimes, MS, RD, CSO, LDN  Pager # [phone]

## 2024-03-14 LAB — CBC W/ AUTO DIFF
BASOPHILS ABSOLUTE COUNT: 0.1 10*9/L (ref 0.0–0.1)
BASOPHILS RELATIVE PERCENT: 0.9 %
EOSINOPHILS ABSOLUTE COUNT: 0.3 10*9/L (ref 0.0–0.5)
EOSINOPHILS RELATIVE PERCENT: 2.2 %
HEMATOCRIT: 30.1 % — ABNORMAL LOW (ref 34.0–44.0)
HEMOGLOBIN: 10.2 g/dL — ABNORMAL LOW (ref 11.3–14.9)
LYMPHOCYTES ABSOLUTE COUNT: 1.4 10*9/L (ref 1.1–3.6)
LYMPHOCYTES RELATIVE PERCENT: 9.4 %
MEAN CORPUSCULAR HEMOGLOBIN CONC: 33.9 g/dL (ref 32.0–36.0)
MEAN CORPUSCULAR HEMOGLOBIN: 28.9 pg (ref 25.9–32.4)
MEAN CORPUSCULAR VOLUME: 85.2 fL (ref 77.6–95.7)
MEAN PLATELET VOLUME: 8.2 fL (ref 6.8–10.7)
MONOCYTES ABSOLUTE COUNT: 1.6 10*9/L — ABNORMAL HIGH (ref 0.3–0.8)
MONOCYTES RELATIVE PERCENT: 10.7 %
NEUTROPHILS ABSOLUTE COUNT: 11.5 10*9/L — ABNORMAL HIGH (ref 1.8–7.8)
NEUTROPHILS RELATIVE PERCENT: 76.8 %
PLATELET COUNT: 392 10*9/L (ref 150–450)
RED BLOOD CELL COUNT: 3.54 10*12/L — ABNORMAL LOW (ref 3.95–5.13)
RED CELL DISTRIBUTION WIDTH: 14.3 % (ref 12.2–15.2)
WBC ADJUSTED: 15 10*9/L — ABNORMAL HIGH (ref 3.6–11.2)

## 2024-03-14 LAB — BASIC METABOLIC PANEL
ANION GAP: 14 mmol/L (ref 5–14)
BLOOD UREA NITROGEN: 21 mg/dL (ref 9–23)
BUN / CREAT RATIO: 30
CALCIUM: 9 mg/dL (ref 8.7–10.4)
CHLORIDE: 105 mmol/L (ref 98–107)
CO2: 25 mmol/L (ref 20.0–31.0)
CREATININE: 0.69 mg/dL (ref 0.55–1.02)
EGFR CKD-EPI (2021) FEMALE: >90 mL/min/{1.73_m2} (ref >=60)
GLUCOSE RANDOM: 110 mg/dL (ref 70–179)
POTASSIUM: 3.9 mmol/L (ref 3.4–4.8)
SODIUM: 144 mmol/L (ref 135–145)

## 2024-03-14 LAB — HEPATIC FUNCTION PANEL
ALBUMIN: 2.9 g/dL — ABNORMAL LOW (ref 3.4–5.0)
ALKALINE PHOSPHATASE: 137 U/L — ABNORMAL HIGH (ref 46–116)
ALT (SGPT): 52 U/L — ABNORMAL HIGH (ref 10–49)
AST (SGOT): 22 U/L (ref <=34)
BILIRUBIN DIRECT: <0.1 mg/dL (ref 0.00–0.30)
BILIRUBIN TOTAL: 0.2 mg/dL — ABNORMAL LOW (ref 0.3–1.2)
PROTEIN TOTAL: 6.7 g/dL (ref 5.7–8.2)

## 2024-03-14 LAB — MAGNESIUM: MAGNESIUM: 2.2 mg/dL (ref 1.6–2.6)

## 2024-03-14 LAB — PHOSPHORUS: PHOSPHORUS: 4.4 mg/dL (ref 2.4–5.1)

## 2024-03-14 MED ADMIN — cefTRIAXone (ROCEPHIN) 2 g in sodium chloride 0.9 % (NS) 100 mL IVPB-MBP: 2 g | INTRAVENOUS | @ 07:00:00 | Stop: 2024-03-17

## 2024-03-14 MED ADMIN — thiamine mononitrate (vit B1) tablet 100 mg: 100 mg | ORAL | @ 14:00:00

## 2024-03-14 MED ADMIN — budesonide (PULMICORT) nebulizer solution 0.5 mg: .5 mg | RESPIRATORY_TRACT | @ 02:00:00

## 2024-03-14 MED ADMIN — budesonide (PULMICORT) nebulizer solution 0.5 mg: .5 mg | RESPIRATORY_TRACT | @ 14:00:00

## 2024-03-14 MED ADMIN — QUEtiapine (SEROQUEL) tablet 12.5 mg: 12.5 mg | ORAL | @ 20:00:00

## 2024-03-14 MED ADMIN — lurasidone (LATUDA) tablet 40 mg: 40 mg | ORAL | @ 23:00:00

## 2024-03-14 MED ADMIN — ipratropium-albuterol (DUO-NEB) 0.5-2.5 mg/3 mL nebulizer solution 3 mL: 3 mL | RESPIRATORY_TRACT | @ 14:00:00

## 2024-03-14 MED ADMIN — ipratropium-albuterol (DUO-NEB) 0.5-2.5 mg/3 mL nebulizer solution 3 mL: 3 mL | RESPIRATORY_TRACT | @ 03:00:00

## 2024-03-14 MED ADMIN — mirtazapine (REMERON) tablet 30 mg: 30 mg | ORAL | @ 02:00:00

## 2024-03-14 MED ADMIN — predniSONE (DELTASONE) tablet 40 mg: 40 mg | ORAL | @ 14:00:00 | Stop: 2024-03-18

## 2024-03-14 MED ADMIN — azithromycin (ZITHROMAX) tablet 500 mg: 500 mg | ORAL | @ 07:00:00 | Stop: 2024-03-15

## 2024-03-14 MED ADMIN — senna (SENOKOT) tablet 2 tablet: 2 | ORAL | @ 02:00:00

## 2024-03-14 MED ADMIN — multivitamins, therapeutic with minerals tablet 1 tablet: 1 | ORAL | @ 15:00:00

## 2024-03-14 MED ADMIN — traZODone (DESYREL) tablet 150 mg: 150 mg | ORAL | @ 02:00:00

## 2024-03-14 MED ADMIN — cyanocobalamin (vitamin B-12) tablet 1,000 mcg: 1000 ug | ORAL | @ 14:00:00

## 2024-03-14 MED ADMIN — polyethylene glycol (MIRALAX) packet 17 g: 17 g | ORAL | @ 02:00:00

## 2024-03-14 NOTE — Plan of Care (Signed)
 Pt A&O x4 and pt's oxygenation was closely monitored on this shift. Pt desat with ambulation but recovers to target oxygen goal- provider was notified. Pt went down for stat ultrasound to rule out DVT overnight after complaints of increased swelling of BLE. Pt denied pain and appeared to be comfortable on this shift. Pt maintained safety and no acute events occurred on this shift.

## 2024-03-14 NOTE — Plan of Care (Signed)
 Problem: Adult Inpatient Plan of Care  Goal: Optimal Comfort and Wellbeing  Outcome: Shift Focus  Goal: Readiness for Transition of Care  Outcome: Shift Focus     Problem: Infection  Goal: Absence of Infection Signs and Symptoms  Outcome: Shift Focus  Intervention: Prevent or Manage Infection  Recent Flowsheet Documentation  Taken 03/14/2024 0800 by Alm Darryle HERO, RN  Infection Management: aseptic technique maintained     Problem: Fall Injury Risk  Goal: Absence of Fall and Fall-Related Injury  Outcome: Shift Focus  Intervention: Promote Injury-Free Environment  Recent Flowsheet Documentation  Taken 03/14/2024 0800 by Alm Darryle HERO, RN  Safety Interventions: sitter at bedside

## 2024-03-14 NOTE — Consults (Signed)
 Palliative Care Progress Note            Consultation from Requesting Attending Physician:  Elease Lyndall [place], DO  Service Requesting Consult:  Oncology/Hematology (M[facility])  Reason for Consult Request from Attending Physician:  Evaluation of Symptoms, Goals of Care / Decision Making, and Patient and Family Support  Primary Care Provider:  Adrianna Evalene Mt, DO  Primary Oncologist: Not yet established      Assessment/Plan:      SUMMARY:  This 69 y.o. patient is seriously and acutely ill due to new lung mass concerning for non-small cell lung cancer-biopsy pending. Illness is complicated by co-morbid acute and chronic conditions including frailty, malnutrition, depression (psych following), and loss of independence.     2/2 update:  -- Christy Buchanan and daughter are still contemplating treatment vs no treatment (see ACP note from 2/2). Currently they are leaning toward no treatment.   --Christy Buchanan would like to talk to psychiatry to better understand IVC restrictions and how this plays into dispo planning +/- treatment.  -- Christy Buchanan would also like to talk with onc team today to better understand recommendations surround new thrombosis   -- Palliative discussed symptom recommendations with oncology in person, see details below.  We will f/u again on 2/4 for support, ongoing GOC, and symptom evaluation.      Symptom Assessment and Recommendations:      # Thoracic chest pain and dyspnea with associated dry cough  Recommend:  -- Continue low dose oxycodone  2.5mg  po every  4 hours PRN   -- Agree with guaifenesin  PRN    # New onset diarrhea, likely related to scheduled opioids that were started 2/2.  Endorsing 4-5 loose stools over the past 12 hours.  Recommendation:  -- CHANGE Miralax   and Senna to daily PRN  -- Start imodium  2mg  four times daily PRN, administer after each loose stool          Goals of Care and Decision Making Assessment and Recommendations:       Prognosis / prognostic understanding: 3 to 6 months without treatment.  Potentially longer with cancer directed therapy  Decisional capacity at time of visit:  Full  Healthcare Decision Maker if lacks capacity:    HCDM (patient stated preference): Christy Buchanan - Daughter - [phone]  Advance Directive: no  Code status:   Code Status: Full Code       Current Goals of care:    --See ACP 2/2      Practical, Emotional, Spiritual Support Recommendations:  -- Christy Buchanan is demonstrating low mood.  She is reflecting heavily on regrets and how many people she has hurt in her life.  d She feels well supported by her daughters, and is leaning on them for strength and guidance.  -- Will consider involving palliative care social worker for psychosocial support.    Thank you for this consult. Please contact Izetta Kaiser, AGNP via Epic or Palliative Care [phone]) if there are any questions.       Subjective:     Interval updates.  New onset diarrhea, per above.  Overall feels dyspnea symptoms are stable and well controlled on 2L O2.  Needing assistance with ADL's due to dyspnea with exertion.   Appetite remains poor.    Today Christy Buchanan maintains reluctance to pursue disease directed therapy, feeling that her body is too frail and deconditioned to tolerate the side effects and potential insults of disease directed therapy.  Daughter Christy Buchanan is supportive of what ever she decides.  Today they  continue to weigh risks and benefits of treatment options.        Living situation:   Previously lived independently in [place] .  However, with new cancer does gnosis feels that she is no longer going to be able to care for herself on her own  Support system / caregivers: She has 3 daughters.  Daughter Christy Buchanan is her local support  Coping / spiritual: Leans on her daughters for strength.  She used to have hobbies (though does not want expand on this today), however is having a hard time finding joy in the things that she has typically enjoyed.      Allergies:  Allergies[1]    Medications:  Scheduled Meds:Scheduled Medications[2]  Continuous Infusions:Infusions Meds[3]  PRN Meds:.PRN Medications[4]     Past Medical History[5]    Past Surgical History[6]          Objective:       Function:  70% - Ambulation: Reduced / unable to do normal work, some evidence of disease / Self-Care: Full / Intake: Normal or reduced / Level of Conscious: Full    Temp:  [36.3 ??C (97.3 ??F)-37 ??C (98.6 ??F)] 37 ??C (98.6 ??F)  Pulse:  [87-117] 110  Resp:  [18] 18  BP: (134-155)/(71-87) 136/71  SpO2:  [90 %-97 %] 97 %    Physical Exam:  Constitutional: Frail middle-aged female.  Chronically-ill appearing.  Sitting up in chair. NAD.  Pulm: wearing 2L , dry cough present  MS: Moves extremities freely.    Skin:  Warm/dry.  Neuro: cognitive status: Alert and oriented x 3 muscle strength: Strong bilateral grip  Psych: Low mood      Labs and imaging reviewed:  2/3 PVL shows bilateral DVT :R femoral and L popliteal veins  Hypoalbuminemia suggestive of poor nutritional intake  Creatinine WNL    I personally spent 60 minutes face-to-face and non-face-to-face in the care of this patient, which includes all pre, intra, and post visit time on the date of service.  All documented time was specific to the E/M visit and does not include any procedures that may have been performed.     See ACP Note from today for additional billable service:  No    Izetta Kaiser, Palliative Care AGNP          [1]   Allergies  Allergen Reactions    Escitalopram  Other (See Comments)   [2]    [Provider Hold] atorvastatin   80 mg Oral Daily    azithromycin   500 mg Oral Q24H    budesonide  (PULMICORT ) nebulizer solution  0.5 mg Nebulization BID (RT)    cefTRIAXone   2 g Intravenous Q24H    cyanocobalamin  (vitamin B-12)  1,000 mcg Oral Daily    ipratropium-albuterol   3 mL Nebulization Q6H (RT)    lurasidone   40 mg Oral Daily    mirtazapine   30 mg Oral Nightly    multivitamins (ADULT)  1 tablet Oral Daily pantoprazole   20 mg Oral Daily before breakfast    predniSONE   40 mg Oral Daily    thiamine  mononitrate (vit B1)  100 mg Oral Daily    traZODone   150 mg Oral Nightly   [3] [4] acetaminophen , carboxymethylcellulose sodium, guaiFENesin , loperamide , melatonin, menthol , oxyCODONE , polyethylene glycol, QUEtiapine , senna, simethicone  [5]   Past Medical History:  Diagnosis Date    Autoimmune thyroiditis     Breast cancer    (CMS-HCC)     Difficult intravenous access     GERD (gastroesophageal reflux  disease)     Hyperlipidemia     Osteoporosis     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    Sjogren's disease ([facility]HS-HCC)     Thrombocythemia     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    Placed 11.2023  [place]  150 is setting 11.2023    Vitamin D deficiency    [6]   Past Surgical History:  Procedure Laterality Date    AUGMENTATION MAMMAPLASTY Bilateral     24ish yrs    BREAST BIOPSY Left     malignant    CHEMOTHERAPY      MASTECTOMY Left 25 ish yrs ago    PR BRNCHSC EBUS GUIDED SAMPL 1/2 NODE STATION/STRUX N/A 03/08/2024    Procedure: BRONCH, RIGID OR FLEXIBLE, INC FLUORO GUIDANCE, WHEN PERFORMED; WITH EBUS GUIDED TRANSTRACHEAL AND/OR TRANSBRONCHIAL SAMPLING, ONE OR TWO MEDIASTINAL AND/OR HILAR LYMPH NODE STATIONS OR STRUCTURES;  Surgeon: Fontaine Selinda Beam, MD;  [facility];  Service: Pulmonary    PR BRONCHOSCOPY W/THER ASPIR TRACHBRNCL TREE 1ST Right 03/08/2024    Procedure: BRONCHOSCPY, RIGID OR FLEXIBLE, W/FLUORO; WITH THERAPEUTIC ASPIRATION OF TRACHEOBRONCHIAL TREE, INITIAL;  Surgeon: Fontaine Selinda Beam, MD;  Location: [facility];  Service: Pulmonary    PR BRONCHOSCOPY,DIAGNOSTIC W LAVAGE Right 03/08/2024    Procedure: BRONCHOSCOPY, RIGID OR FLEXIBLE, INCLUDE FLUOROSCOPIC GUIDANCE WHEN PERFORMED; W/BRONCHIAL ALVEOLAR LAVAGE;  Surgeon: Fontaine Selinda Beam, MD;  Location: [facility];  Service: Pulmonary    PR BRONCHOSCOPY,EXCIS LESN Right 03/08/2024    Procedure: BRONCHOSCOPY, RIGID OR FLEXIBLE, INCLUDING FLUOROSCOPIC GUIDANCE WHEN PERFORMED; WITH EXCISION OF TUMOR;  Surgeon: Fontaine Selinda Beam, MD;  Location: [facility];  Service: Pulmonary    PR BRONCHOSCOPY,TRANSBRON ASPIR BX Right 03/08/2024    Procedure: BRONCHOSCOPY, RIGID/FLEX, INCL FLUORO; W/TRANSBRONCH NDL ASPIRAT BX, TRACHEA, MAIN STEM &/OR LOBAR BRONCHUS;  Surgeon: Fontaine Selinda Beam, MD;  Location: [facility];  Service: Pulmonary    PR CREATE SHUNT:VENTRIC-PERITONEAL Right 12/30/2021    Procedure: CREAT SHUNT; VENTRICULO-PERITONEAL/PLEURAL;  Surgeon: Kari Donnice Gaster, MD;  Location: MAIN OR [place];  Service: Neurosurgery    PR LAP, SURG ENTEROLYSIS Right 12/30/2021    Procedure: LAPAROSCOPY, SURGICAL, ENTEROLYSIS (FREEING OF INTESTINAL ADHESION) (SEPARATE PROCEDURE);  Surgeon: Carlin Curtistine Route, MD;  Location: [facility];  Service: Trauma    RADIATION Left

## 2024-03-14 NOTE — Consults (Signed)
 Additional order received and the patient is already on the caseload. Continue with plan of care.

## 2024-03-15 LAB — HEPATIC FUNCTION PANEL
ALBUMIN: 3 g/dL — ABNORMAL LOW (ref 3.4–5.0)
ALKALINE PHOSPHATASE: 142 U/L — ABNORMAL HIGH (ref 46–116)
ALT (SGPT): 42 U/L (ref 10–49)
AST (SGOT): 17 U/L (ref <=34)
BILIRUBIN DIRECT: <0.1 mg/dL (ref 0.00–0.30)
BILIRUBIN TOTAL: 0.3 mg/dL (ref 0.3–1.2)
PROTEIN TOTAL: 6.9 g/dL (ref 5.7–8.2)

## 2024-03-15 LAB — APTT
APTT: 26.8 s (ref 24.8–38.4)
HEPARIN CORRELATION: 0.2

## 2024-03-15 LAB — PHOSPHORUS: PHOSPHORUS: 3.5 mg/dL (ref 2.4–5.1)

## 2024-03-15 LAB — BLOOD GAS CRITICAL CARE PANEL, VENOUS
BASE EXCESS VENOUS: 0.6 (ref -2.0–2.0)
CALCIUM IONIZED VENOUS (MG/DL): 4.83 mg/dL (ref 4.40–5.40)
CARBOXYHEMOGLOBIN, VENOUS: 1.1 % (ref <1.2)
CHLORIDE, WHOLE BLOOD: 111 mmol/L — ABNORMAL HIGH (ref 98–107)
FIO2 VENOUS: 100
GLUCOSE WHOLE BLOOD: 156 mg/dL (ref 70–179)
HCO3 VENOUS: 24 mmol/L (ref 22–27)
HEMOGLOBIN BLOOD GAS: 11.2 g/dL — ABNORMAL LOW (ref 12.00–16.00)
LACTATE BLOOD VENOUS: 1.8 mmol/L (ref 0.5–1.8)
METHEMOGLOBIN, VENOUS: <1 % (ref <1.5)
O2 SATURATION VENOUS: 73.4 % (ref 40.0–85.0)
OXYHEMOGLOBIN, VENOUS: 72.2 % (ref 40.0–85.0)
PCO2 VENOUS: 32 mmHg — ABNORMAL LOW (ref 40–60)
PH VENOUS: 7.48 — ABNORMAL HIGH (ref 7.32–7.43)
PO2 VENOUS: 41 mmHg (ref 30–55)
POTASSIUM WHOLE BLOOD: 3.7 mmol/L (ref 3.4–4.6)
SODIUM WHOLE BLOOD: 142 mmol/L (ref 135–145)

## 2024-03-15 LAB — CBC W/ AUTO DIFF
BASOPHILS ABSOLUTE COUNT: 0 10*9/L (ref 0.0–0.1)
BASOPHILS RELATIVE PERCENT: 0.2 %
EOSINOPHILS ABSOLUTE COUNT: 0.1 10*9/L (ref 0.0–0.5)
EOSINOPHILS RELATIVE PERCENT: 0.3 %
HEMATOCRIT: 32.7 % — ABNORMAL LOW (ref 34.0–44.0)
HEMOGLOBIN: 10.9 g/dL — ABNORMAL LOW (ref 11.3–14.9)
LYMPHOCYTES ABSOLUTE COUNT: 0.5 10*9/L — ABNORMAL LOW (ref 1.1–3.6)
LYMPHOCYTES RELATIVE PERCENT: 3 %
MEAN CORPUSCULAR HEMOGLOBIN CONC: 33.5 g/dL (ref 32.0–36.0)
MEAN CORPUSCULAR HEMOGLOBIN: 28.8 pg (ref 25.9–32.4)
MEAN CORPUSCULAR VOLUME: 85.8 fL (ref 77.6–95.7)
MEAN PLATELET VOLUME: 8 fL (ref 6.8–10.7)
MONOCYTES ABSOLUTE COUNT: 0.4 10*9/L (ref 0.3–0.8)
MONOCYTES RELATIVE PERCENT: 2.4 %
NEUTROPHILS ABSOLUTE COUNT: 16.7 10*9/L — ABNORMAL HIGH (ref 1.8–7.8)
NEUTROPHILS RELATIVE PERCENT: 94.1 %
PLATELET COUNT: 447 10*9/L (ref 150–450)
RED BLOOD CELL COUNT: 3.81 10*12/L — ABNORMAL LOW (ref 3.95–5.13)
RED CELL DISTRIBUTION WIDTH: 14.3 % (ref 12.2–15.2)
WBC ADJUSTED: 17.7 10*9/L — ABNORMAL HIGH (ref 3.6–11.2)

## 2024-03-15 LAB — BASIC METABOLIC PANEL
ANION GAP: 15 mmol/L — ABNORMAL HIGH (ref 5–14)
BLOOD UREA NITROGEN: 19 mg/dL (ref 9–23)
BUN / CREAT RATIO: 29
CALCIUM: 9 mg/dL (ref 8.7–10.4)
CHLORIDE: 105 mmol/L (ref 98–107)
CO2: 24 mmol/L (ref 20.0–31.0)
CREATININE: 0.66 mg/dL (ref 0.55–1.02)
EGFR CKD-EPI (2021) FEMALE: >90 mL/min/{1.73_m2} (ref >=60)
GLUCOSE RANDOM: 110 mg/dL (ref 70–179)
POTASSIUM: 3.8 mmol/L (ref 3.4–4.8)
SODIUM: 144 mmol/L (ref 135–145)

## 2024-03-15 LAB — BLOOD GAS, ARTERIAL
BASE EXCESS ARTERIAL: 1.7 (ref -2.0–2.0)
CARBOXYHEMOGLOBIN: 1.6 % — ABNORMAL HIGH (ref <1.2)
HCO3 ARTERIAL: 24 mmol/L (ref 22–27)
METHEMOGLOBIN: <1 % (ref <1.5)
O2 SATURATION ARTERIAL: 85.2 % — ABNORMAL LOW (ref 94.0–100.0)
OXYHEMOGLOBIN: 83.6 % — ABNORMAL LOW (ref 94.0–100.0)
PCO2 ARTERIAL: 29.8 mmHg — ABNORMAL LOW (ref 35.0–45.0)
PH ARTERIAL: 7.52 — ABNORMAL HIGH (ref 7.35–7.45)
PO2 ARTERIAL: 49.3 mmHg — ABNORMAL LOW (ref 80.0–110.0)

## 2024-03-15 LAB — MAGNESIUM: MAGNESIUM: 2.1 mg/dL (ref 1.6–2.6)

## 2024-03-15 MED ORDER — ELIQUIS 5 MG TABLET
ORAL_TABLET | ORAL | 0 refills | 0.00000 days | Status: CP
Start: 2024-03-15 — End: 2024-03-15

## 2024-03-15 MED ADMIN — cefTRIAXone (ROCEPHIN) 2 g in sodium chloride 0.9 % (NS) 100 mL IVPB-MBP: 2 g | INTRAVENOUS | @ 06:00:00 | Stop: 2024-03-15

## 2024-03-15 MED ADMIN — thiamine mononitrate (vit B1) tablet 100 mg: 100 mg | ORAL | @ 13:00:00 | Stop: 2024-03-15

## 2024-03-15 MED ADMIN — budesonide (PULMICORT) nebulizer solution 0.5 mg: .5 mg | RESPIRATORY_TRACT | @ 02:00:00

## 2024-03-15 MED ADMIN — budesonide (PULMICORT) nebulizer solution 0.5 mg: .5 mg | RESPIRATORY_TRACT | @ 13:00:00 | Stop: 2024-03-15

## 2024-03-15 MED ADMIN — QUEtiapine (SEROQUEL) tablet 12.5 mg: 12.5 mg | ORAL | @ 13:00:00

## 2024-03-15 MED ADMIN — loperamide (IMODIUM) capsule 4 mg: 4 mg | ORAL | @ 13:00:00

## 2024-03-15 MED ADMIN — methylPREDNISolone sodium succinate (SOLU-Medrol) injection 125 mg: 125 mg | INTRAVENOUS | @ 09:00:00 | Stop: 2024-03-15

## 2024-03-15 MED ADMIN — ipratropium-albuterol (DUO-NEB) 0.5-2.5 mg/3 mL nebulizer solution 3 mL: 3 mL | RESPIRATORY_TRACT | @ 13:00:00 | Stop: 2024-03-15

## 2024-03-15 MED ADMIN — ipratropium-albuterol (DUO-NEB) 0.5-2.5 mg/3 mL nebulizer solution 3 mL: 3 mL | RESPIRATORY_TRACT | @ 08:00:00 | Stop: 2024-03-15

## 2024-03-15 MED ADMIN — mirtazapine (REMERON) tablet 30 mg: 30 mg | ORAL | @ 02:00:00

## 2024-03-15 MED ADMIN — cefepime (MAXIPIME) 2 g in sodium chloride 0.9 % (NS) 100 mL IVPB-MBP: 2 g | INTRAVENOUS | @ 13:00:00 | Stop: 2024-03-15

## 2024-03-15 MED ADMIN — predniSONE (DELTASONE) tablet 40 mg: 40 mg | ORAL | @ 13:00:00 | Stop: 2024-03-15

## 2024-03-15 MED ADMIN — azithromycin (ZITHROMAX) tablet 500 mg: 500 mg | ORAL | @ 06:00:00 | Stop: 2024-03-15

## 2024-03-15 MED ADMIN — LORazepam (ATIVAN) tablet 1 mg: 1 mg | ORAL | @ 14:00:00 | Stop: 2024-03-15

## 2024-03-15 MED ADMIN — azithromycin (ZITHROMAX) tablet 500 mg: 500 mg | ORAL | @ 09:00:00 | Stop: 2024-03-15

## 2024-03-15 MED ADMIN — midazolam (VERSED) injection 0.5 mg: .5 mg | INTRAVENOUS | @ 15:00:00 | Stop: 2024-03-15

## 2024-03-15 MED ADMIN — multivitamins, therapeutic with minerals tablet 1 tablet: 1 | ORAL | @ 13:00:00 | Stop: 2024-03-15

## 2024-03-15 MED ADMIN — pantoprazole (Protonix) EC tablet 20 mg: 20 mg | ORAL | @ 13:00:00 | Stop: 2024-03-15

## 2024-03-15 MED ADMIN — acetaminophen (TYLENOL) tablet 650 mg: 650 mg | ORAL | @ 08:00:00 | Stop: 2024-03-15

## 2024-03-15 MED ADMIN — traZODone (DESYREL) tablet 150 mg: 150 mg | ORAL | @ 02:00:00

## 2024-03-15 MED ADMIN — midazolam (VERSED) 1 mg/mL injection: INTRAVENOUS | @ 15:00:00 | Stop: 2024-03-15

## 2024-03-15 MED ADMIN — cyanocobalamin (vitamin B-12) tablet 1,000 mcg: 1000 ug | ORAL | @ 13:00:00 | Stop: 2024-03-15

## 2024-03-15 MED ADMIN — heparin 25,000 Units/250 mL (100 units/mL) in 0.45% saline infusion (premade): 0-24 [IU]/kg/h | INTRAVENOUS | @ 15:00:00 | Stop: 2024-03-15

## 2024-03-15 MED ADMIN — apixaban (ELIQUIS) tablet 2.5 mg: 2.5 mg | ORAL | @ 02:00:00

## 2024-03-15 MED ADMIN — apixaban (ELIQUIS) tablet 2.5 mg: 2.5 mg | ORAL | @ 13:00:00 | Stop: 2024-03-15

## 2024-03-15 MED ADMIN — LORazepam (ATIVAN) tablet 1 mg: 1 mg | ORAL | @ 17:00:00 | Stop: 2024-03-15

## 2024-03-15 MED ADMIN — lisinopril (PRINIVIL,ZESTRIL) tablet 10 mg: 10 mg | ORAL | @ 14:00:00 | Stop: 2024-03-15

## 2024-03-15 MED ADMIN — furosemide (LASIX) injection 40 mg: 40 mg | INTRAVENOUS | @ 14:00:00 | Stop: 2024-03-15

## 2024-03-15 NOTE — Progress Notes (Signed)
 Oncology (MEDO) Progress Note    Assessment & Plan:   Christy Buchanan is a 69 y.o. female who is presenting to [facility] with Acute hypoxic respiratory failure    (CMS-HCC), in the setting of the following pertinent/contributing co-morbidities: see below.    Principal Problem:    Acute hypoxic respiratory failure    (CMS-HCC)  Active Problems:    Gastroesophageal reflux disease    History of breast cancer    Mixed hyperlipidemia    Postmenopausal osteoporosis    Pure hypercholesterolemia    Essential thrombocythemia    (CMS-HCC)    Acquired hypothyroidism    JAK-2 gene mutation    MPN (myeloproliferative neoplasm)    (CMS-HCC)    NPH (normal pressure hydrocephalus) (CMS-HCC)    Ventriculo-peritoneal shunt status    MCI (mild cognitive impairment) with memory loss    Severe episode of recurrent major depressive disorder, without psychotic features    (CMS-HCC)    Bipolar II disorder    (CMS-HCC)    Wrist laceration, left, sequela    Anxiety    Chronic obstructive pulmonary disease with acute exacerbation    (CMS-HCC)    Pneumonia    NSCLC of right lung (CMS-HCC)    Insomnia    History of suicide attempt    Deep vein thrombosis (DVT) of lower extremity (CMS-HCC)    Active Problems  Acute hypoxic respiratory failure, concern for PE ? transition to comfort-focused care  RRT called for acute desaturation with rapidly escalating oxygen requirements, progressing from non-rebreather to HFNC, concerning for worsening respiratory failure and possible pulmonary embolism. CTA chest was recommended but declined by the patient. In the setting of ongoing clinical decline and previously expressed limits on life-prolonging interventions, a goals-of-care discussion was held with the patient and her daughter, Christy Buchanan (see ACP note dated today). Given anticipated need for ICU-level care and low likelihood of meaningful recovery with intubation or resuscitation, the patient elected to transition to comfort-focused care and changed code status to DNR/DNI.   - Morphine 5 mg PRN for moderate to severe pain and dyspnea  - Palliative care consulted   - Start hydromorphone IV 0.5 mg every 2 hours as needed for pain and dyspnea symptoms  -- Start Ativan  IV 1 mg every 2 hours PRN for anxiety  -- Could consider adding Haldol IV 1 mg every 4 hours PRN for hyperactive delirium  - Expressed desire for hospice care     Major depressive disorder with suicidal ideation - History of suicide attempts - Possible Bipolar II disorder and PTSD - Anxiety - Possible major neurocognitive disorder - Insomnia  Patient was originally admitted to psychiatry for worsening depression with recent suicide attempt (cut wrists), subacute cognitive decline, and inability to care for herself. MOCA 23/30. Extensive psychiatric history, see psych notes.  - Psychiatry consulted, appreciate recommendations  - Continue lurasidone  40 mg daily  - Continue mirtazapine  30 mg qHS  - Continue trazodone  150 mg qHS  - Continue quetiapine  12.5 mg BID PRN anxiety or sleep  - Continue melatonin 3 mg qHS PRN sleep  - Discontinue IVC   - Continue with 1:1 sitter         GLIM Severe Malnutrition (03/13/24 1239)  Low Body Mass Index: <18.5 if < 70 years  Reduced Muscle Mass: Severe deficit  Reduced Food Intake: Any reduction during > 2 weeks  Inflammation: Chronic disease related     Issues Impacting Complexity of Management:  -The patient is at high risk of  complications from metastasis, COPD exacerbation    Medical Decision Making: Discussed the patient's management and/or test interpretation with Psychiatry and Radiation oncology, palliative care as summarized within this note    Chronic Problems  Self-inflicted left wrist wound, improving  Sequelae of previous suicide attempt.  - WOCN consulted, appreciate recommendations   - Left wrist wound healing and may remain open to air    Daily Checklist:  Diet: Regular Diet  DVT PPx: Contraindicated - High Risk for Bleeding/Active Bleeding (chronic subdural hematomas), consider restarting  Electrolytes: No Repletion Needed  Code Status: Full Code  Dispo: once stable from medical stand point    Team Contact Information:   Primary Team: Oncology (MEDO)  Primary Resident: Bobbette LOISE Ross, MD, MD  Resident's Pager: [phone] (Oncology Intern - Tower)    Interval History:    SOB and anxiety on HFNC, positive for cough and chest pain with deep inhalation. Feels nervous, requests involvement of daughter for discussion of medical management.    Objective:   Temp:  [36.5 ??C (97.7 ??F)-37.1 ??C (98.7 ??F)] (P) 36.7 ??C (98.1 ??F)  Pulse:  [102-113] 111  SpO2 Pulse:  [110-114] 110  Resp:  [15-21] 15  BP: (123-175)/(71-89) (P) 151/78  FiO2 (%):  [50 %-100 %] (P) 50 %  SpO2:  [77 %-97 %] 91 %    Gen: cachectic, chronically ill appearing  Eyes: Sclera anicteric  HENT: Atraumatic, normocephalic HFNC  Neck: Trachea midline  Heart: tachycardic  Lungs: increase work of breathing, without wheezing or crackles  Abdomen: Soft, NTND  Extremities: Bilateral lower extremity +1 edema  Neuro: Grossly symmetric, non-focal    Skin:  No rashes, lesions on clothed exam  Psych: Alert, anxious.  Conversant

## 2024-03-15 NOTE — Consults (Signed)
 [facility]  Follow-Up Psychiatry Consult Note      Date of admission: 03/12/2024 11:18 PM  Service Date: March 15, 2024  Primary Team: Oncology/Hematology (MDE)  LOS:  LOS: 3 days      Assessment:   Christy Buchanan is a 69 y.o. 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  Patient was seen in consultation by request of [facility], DO for evaluation of depression.     Christy Buchanan was admitted to the geriatric psychiatric unit symptoms consistent with a diagnosis of major depressive disorder, including low mood, difficulty sleeping, anhedonia, weight loss, difficulty concentrating, anxiety, and recent suicide attempt (via cutting wrists).  There is also concerns for trauma symptoms given diagnosis of PTSD, history of trauma, and reports of nightmares and hypervigilance. Must also consider contribution of psychosocial stressors, as per daughter patient's anxiety significantly worsened after receiving court summons for financial stressors in October. Patient and family report sub-acute cognitive decline (confusion, inability to complete IDLs, getting lost in familiar situations) over the last year that abruptly and significantly worsened in December. She was receiving work-up for potential shunt dysfunction with her outpatient neurosurgeon, who she sees for a history of NPH, but did not complete imaging due to anxiety. Should also consider potential underlying neurocognitive disorder in addition to shunt dysfunction given MRI from 2023 with chronic small vessel ischemic changes. Patinet also has newly dx NSCLC and soon to have further discussion of prognosis and treatment at this time.     Today, patient endorses feeling overwhelmed with continued worsening in physical health. She was able to attend appropriately to interview and completes attention testing appropriately. Therefore, presently low concern for delirium She is being supported by daughter Christy Buchanan) in complex medical decisions. She has denied suicidal ideation, and while she remains at elevated risk for suicide owing to recent suicide attempt, recent onset of serious medical condition, she is currently cooperating with team and openly expressing her needs and concerns. Thus, will rescind IVC at this time. However, will recommend continued 1:1 sitter in place for next few days while goals of care and disposition discussions occur. Will recommend decreasing trazodone  slightly to decrease potential for delirium, as patient is presently at increased risk (hypoxia, concern for PE, admission to ICU). If patient transitions towards comfort measures, primary team may consider addition of medications for acute anxiety. Please see below for detailed recommendations.     Diagnoses:   Active Hospital problems:  Principal Problem:    Acute hypoxic respiratory failure    (CMS-HCC)  Active Problems:    Gastroesophageal reflux disease    History of breast cancer    Mixed hyperlipidemia    Postmenopausal osteoporosis    Pure hypercholesterolemia    Essential thrombocythemia    (CMS-HCC)    Acquired hypothyroidism    JAK-2 gene mutation    MPN (myeloproliferative neoplasm)    (CMS-HCC)    NPH (normal pressure hydrocephalus) (CMS-HCC)    Ventriculo-peritoneal shunt status    MCI (mild cognitive impairment) with memory loss    Severe episode of recurrent major depressive disorder, without psychotic features    (CMS-HCC)    Bipolar II disorder    (CMS-HCC)    Wrist laceration, left, sequela    Anxiety  Chronic obstructive pulmonary disease with acute exacerbation    (CMS-HCC)    Pneumonia    NSCLC of right lung (CMS-HCC)    Insomnia    History of suicide attempt    Deep vein thrombosis (DVT) of lower extremity (CMS-HCC)       Problems edited/added by me:  No problems updated.    Risk Assessment:  ASQ screening result: low risk    -A suicide and violence risk assessment was performed as part of this evaluation. Risk factors for self-harm/suicide: recent suicide attempt, feelings of hopelessness, lack of social support, recent onset of serious medical condition, and suicide attempt leading to current admission.  Protective factors against self-harm/suicide:  lack of active SI, motivation for treatment, supportive family, and current treatment compliance.  Risk factors for harm to others: N/A. Protective factors against harm to others: positive social orientation.     Current suicide risk: low risk   Current homicide risk: low risk        Recommendations:     Safety and Observation Level:   -- This patient is not currently under IVC. If safety concerns arise, please page psychiatry for an evaluation. Recommend routine level of observation per primary team. -- agree with continuing 1:1 sitter at this time. Will continue to assess.     Medications:  -- CONTINUE Latuda  40 mg PO daily   -- CONTINUE Mirtazapine  30 mg PO nightly  -- DECREASE Trazodone  to 100 mg PO nightly  -- Continue Thiamine  PO/NGT 100 mg daily.    Further Work-up:   -- No further recommendations at this time from a psychiatric standpoint    Behavioral / Environmental:   -- Please order Delirium (prevention) protocol: the following can be copied into a single misc nursing order.        - RN to open blinds every morning.        - To bedside: glasses, hearing aide, patient's own shoes. Make available to patient's when possible and encourage use.        - RN to assess orientation (person, place, & time) qam and prn, with frequent reorientation (verbal & whiteboard) & introduction of caregivers.           - Recommend extended visiting hours with familiar family/friends as feasible.        - Encourage normal sleep-wake cycle by promoting a dark, quiet environment at night and stimulating, light environment during the day.          - Turn the TV off when patient is asleep or not in use.    Follow-up:  -- When patient is discharged, please ensure that their AVS includes information about the 26 Suicide & Crisis Lifeline.  -- Deferred at this time.  -- We will follow as needed at this time.     Thank you for this consult request. Recommendations have been communicated to the primary team. Please page [phone] (adult psychiatry consults) for any questions or concerns.     Discussed with and seen by Fellow, Gordon Daniels, DO  Discussed with and seen by Attending, Todd Rogue, MD, who agrees with the assessment and plan.    Gladstone Mediate, MD    I saw and evaluated the patient, participating in the key portions of the service.  I reviewed the resident???s note.  I agree with the resident???s findings and plan.     Nancyann LITTIE Rogue, MD   Subjective     Relevant Aspects of Hospital Course: Admitted on  03/12/24 for acute hypoxic respiratory failure.    Relevant events since last seen by psychiatry:   -- Rapid called, patient desatting on [place], transferred to ICU for NRB  -- PVLs 03/14/2024 - Bilateral LE thrombi       Patient Interview:  Patient resting in bed, breathing mask being fitted. Patient appears anxious. She states that this has all been a lot, regarding recent transfer from floor to the ICU. She endorses feeling pretty emotional today. She says her daughters will be coming by later. Completes SAVEAHAART appropriately. She has gotten her phone back as well. Discussed that chaplain could pass by to see her if needed regarding emotions, and patient expresses understanding but declines at this time, states she feels overwhelmed. Interview is terminated as patient preparing for imaging.     ROS:   All systems reviewed as negative/unremarkable aside from the following pertinent positives and negatives: see interview    Collateral:   - Reviewed medical records in Epic  - Spoke to patient's daughter -- see treatment plan note by this writer on 03/14/2024    Relevant Updates to past psychiatric, medical/surgical, family, or social history: see above.    Current Medications:  Scheduled Meds:Scheduled Medications[1]  Continuous Infusions:Infusions Meds[2]  PRN Meds:.PRN Medications[3]       Objective:   Vital signs:   Temp:  [36.5 ??C (97.7 ??F)-36.9 ??C (98.4 ??F)] 36.5 ??C (97.7 ??F)  Pulse:  [102-120] 104  SpO2 Pulse:  [98-115] 115  Resp:  [13-21] 19  BP: (123-185)/(72-92) 162/86  MAP (mmHg):  [90-119] 104  FiO2 (%):  [50 %-100 %] 100 %  SpO2:  [77 %-97 %] 88 %    Physical Exam:  Gen: No acute distress.  Pulm: Normal work of breathing. Intermittent coughing, wheezing  Neuro/MSK: Bulk normal.  Skin: normal skin tone.    Mental Status Exam:  Appearance:  clean/neat and slim   Attitude:   cooperative   Behavior/Psychomotor:  appropriate eye contact and no abnormal movements   Speech/Language:   normal rate, not pressured, normal volume, normal fluency. normal articulation   Mood:  ???pretty emotional???   Affect:  anxious and decreased range (constricted)   Thought process:  logical, linear, clear, coherent, goal directed   Thought content:    Does not voice SI, HI.    Perceptual disturbances:   behavior not concerning for response to internal stimuli   Attention/concentration:  able to attend to interview without fluctuations in consciousness, SAVEAHAART completed appropriately   Orientation:  grossly oriented.   Memory:  not formally tested, but grossly intact   Fund of knowledge:   not formally assessed   Insight:    Fair   Judgment:   Fair   Impulse Control:  Fair     Relevant laboratory/imaging data was reviewed.    Additional Psychometric Testing:  Not applicable.    Time-based billing disclaimer:  I personally spent 50   minutes face-to-face and non-face-to-face in the care of this patient, which includes all pre, intra, and post visit time on the date of service.  All documented time was specific to the E/M visit and does not include any procedures that may have been performed.            [1]    lurasidone   40 mg Oral Daily    mirtazapine   30 mg Oral Nightly    traZODone   150 mg Oral Nightly   [2] [3] [DISCONTINUED] acetaminophen  **OR** acetaminophen  **OR** [DISCONTINUED] acetaminophen , HYDROmorphone, loperamide ,  LORazepam , melatonin, menthol , morphine **OR** morphine **OR** morphine, polyethylene glycol, QUEtiapine , simethicone

## 2024-03-15 NOTE — Procedures (Signed)
 VENOUS ACCESS ULTRASOUND PROCEDURE NOTE    Indications:   Poor venous access.    The Venous Access Team has assessed this patient for the placement of a PIV. Ultrasound guidance was necessary to obtain access.     Procedure Details:  Identity of the patient was confirmed via name, medical record number and date of birth. The availability of the correct equipment was verified.    The vein was identified for ultrasound catheter insertion.  Field was prepared with necessary supplies and equipment.  Probe cover and sterile gel utilized.  Insertion site was prepped with chlorhexidine solution and allowed to dry.  The catheter extension was primed with normal saline. A(n) 20 gauge 1.16 catheter was placed in the R Forearm with 1 attempt(s). See LDA for additional details.    Catheter aspirated, 3 mL blood return present. The catheter was then flushed with 10 mL of normal saline. Insertion site cleansed, and dressing applied per manufacturer guidelines. The catheter was inserted without difficulty by Helene DELENA Blanco, RN.    Care RN was notified.     Thank you,     Helene DELENA Blanco, RN Venous Access Team   [phone]     Workup / Procedure Time:  30 minutes    See images below:    Please follow [facility] guidelines for long PIV, deep vein medication contraindications for infusates.    [email or web address] [email or web address] Administration of Non-Antineoplastic Medications via Midline [email or web address]%2FClinical Guidelines

## 2024-03-15 NOTE — Consults (Signed)
 Palliative Care Progress Note            Consultation from Requesting Attending Physician:  Elease Lyndall [place], DO  Service Requesting Consult:  Oncology/Hematology (MDE)  Reason for Consult Request from Attending Physician:  Evaluation of Symptoms, Goals of Care / Decision Making, and Patient and Family Support  Primary Care Provider:  Adrianna Evalene Mt, DO  Primary Oncologist: Not yet established      Assessment/Plan:      SUMMARY:  This 69 y.o. patient is seriously and acutely ill due to new lung mass concerning for non-small cell lung cancer-biopsy pending. Illness is complicated by co-morbid acute and chronic conditions including frailty, malnutrition, depression (psych following), and loss of independence.     2/3 update:  -- Patient experienced an acute respiratory event early this morning and was transferred to stepdown unit.  Following discussion with primary team, [name] and her daughter elected to transition to comfort focused care.  They initially desired home with hospice, but given limited caregiver support and tenuous prognosis they would like to explore options for inpatient hospice: Residential versus GIP.  Care manager aware and will explore options.    Expected prognosis is days to short weeks given the extent of coexisting complications, including bilateral DVT, precipitous respiratory decline, functional decline, and malnutrition.      Symptom Assessment and Recommendations:      Comfort care recommendations:  -- Start hydromorphone IV 0.5 mg every 2 hours as needed for pain and dyspnea symptoms  --Start Ativan  IV 1 mg every 2 hours PRN for anxiety  -- Could consider adding Haldol IV 1 mg every 4 hours PRN for hyperactive delirium          Goals of Care and Decision Making Assessment and Recommendations:       Prognosis / prognostic understanding: Days to short weeks  Decisional capacity at time of visit:  Full  Healthcare Decision Maker if lacks capacity:    HCDM (patient stated preference): Adolm Rocker - Daughter - [phone]  Advance Directive: no  Code status:   Code Status: Comfort Measures - DNR Comfort Care       Current Goals of care:    -- See HCP from primary team.  In short patient and family elect to transition to comfort focused care.        Thank you for this consult. Please contact Izetta Kaiser, AGNP via Epic or Palliative Care [phone]) if there are any questions.       Subjective:     Interval updates.  Per above        Living situation:   Previously lived independently in [place] .  However, with new cancer does gnosis feels that she is no longer going to be able to care for herself on her own  Support system / caregivers: She has 3 daughters.  Daughter Duwaine is her local support  Coping / spiritual: Leans on her daughters for strength.  She used to have hobbies (though does not want expand on this today), however is having a hard time finding joy in the things that she has typically enjoyed.      Allergies:  Allergies[1]    Medications:  Scheduled Meds:Scheduled Medications[2]  Continuous Infusions:Infusions Meds[3]  PRN Meds:.PRN Medications[4]     Past Medical History[5]    Past Surgical History[6]          Objective:       Function:  70% - Ambulation: Reduced / unable to do  normal work, some evidence of disease / Self-Care: Full / Intake: Normal or reduced / Level of Conscious: Full    Temp:  [36.5 ??C (97.7 ??F)-36.9 ??C (98.4 ??F)] 36.5 ??C (97.7 ??F)  Pulse:  [102-120] 104  SpO2 Pulse:  [98-115] 115  Resp:  [13-21] 19  BP: (123-185)/(72-92) 162/86  FiO2 (%):  [50 %-100 %] 100 %  SpO2:  [77 %-97 %] 88 %    Physical Exam:  Constitutional: Frail middle-aged female.  Chronically-ill appearing.  Sitting up in chair. NAD.  Pulm: wearing 2L [place], dry cough present  MS: Moves extremities freely.    Skin:  Warm/dry.  Neuro: cognitive status: Alert and oriented x 3 muscle strength: Strong bilateral grip  Psych: Low mood      Labs and imaging reviewed:  None today    I personally spent 50 minutes face-to-face and non-face-to-face in the care of this patient, which includes all pre, intra, and post visit time on the date of service.  All documented time was specific to the E/M visit and does not include any procedures that may have been performed.     See ACP Note from today for additional billable service:  No    Izetta Kaiser, Palliative Care AGNP          [1]   Allergies  Allergen Reactions    Escitalopram  Other (See Comments)   [2]    lurasidone   40 mg Oral Daily    mirtazapine   30 mg Oral Nightly    traZODone   150 mg Oral Nightly   [3] [4] [DISCONTINUED] acetaminophen  **OR** acetaminophen  **OR** [DISCONTINUED] acetaminophen , HYDROmorphone, loperamide , LORazepam , melatonin, menthol , morphine **OR** morphine **OR** morphine, polyethylene glycol, QUEtiapine , simethicone  [5]   Past Medical History:  Diagnosis Date    Autoimmune thyroiditis     Breast cancer    (CMS-HCC)     Difficult intravenous access     GERD (gastroesophageal reflux disease)     Hyperlipidemia     Osteoporosis     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    Sjogren's disease (HHS-HCC)     Thrombocythemia     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    Placed 11.2023  [place]  150 is setting 11.2023    Vitamin D deficiency    [6]   Past Surgical History:  Procedure Laterality Date    AUGMENTATION MAMMAPLASTY Bilateral     24ish yrs    BREAST BIOPSY Left     malignant    CHEMOTHERAPY      MASTECTOMY Left 25 ish yrs ago    PR BRNCHSC EBUS GUIDED SAMPL 1/2 NODE STATION/STRUX N/A 03/08/2024    Procedure: BRONCH, RIGID OR FLEXIBLE, INC FLUORO GUIDANCE, WHEN PERFORMED; WITH EBUS GUIDED TRANSTRACHEAL AND/OR TRANSBRONCHIAL SAMPLING, ONE OR TWO MEDIASTINAL AND/OR HILAR LYMPH NODE STATIONS OR STRUCTURES;  Surgeon: Fontaine Selinda Beam, MD;  [facility];  Service: Pulmonary    PR BRONCHOSCOPY W/THER ASPIR TRACHBRNCL TREE 1ST Right 03/08/2024    Procedure: BRONCHOSCPY, RIGID OR FLEXIBLE, W/FLUORO; WITH THERAPEUTIC ASPIRATION OF TRACHEOBRONCHIAL TREE, INITIAL;  Surgeon: Fontaine Selinda Beam, MD;  Location[facility];  Service: Pulmonary    PR BRONCHOSCOPY,DIAGNOSTIC W LAVAGE Right 03/08/2024    Procedure: BRONCHOSCOPY, RIGID OR FLEXIBLE, INCLUDE FLUOROSCOPIC GUIDANCE WHEN PERFORMED; W/BRONCHIAL ALVEOLAR LAVAGE;  Surgeon: Fontaine Selinda Beam, MD;  Location: [facility];  Service: Pulmonary    PR BRONCHOSCOPY,EXCIS LESN Right 03/08/2024    Procedure: BRONCHOSCOPY,  RIGID OR FLEXIBLE, INCLUDING FLUOROSCOPIC GUIDANCE WHEN PERFORMED; WITH EXCISION OF TUMOR;  Surgeon: Fontaine Selinda Beam, MD;  Location: [facility];  Service: Pulmonary    PR BRONCHOSCOPY,TRANSBRON ASPIR BX Right 03/08/2024    Procedure: BRONCHOSCOPY, RIGID/FLEX, INCL FLUORO; W/TRANSBRONCH NDL ASPIRAT BX, TRACHEA, MAIN STEM &/OR LOBAR BRONCHUS;  Surgeon: Fontaine Selinda Beam, MD;  Location: [facility];  Service: Pulmonary    PR CREATE SHUNT:VENTRIC-PERITONEAL Right 12/30/2021    Procedure: CREAT SHUNT; VENTRICULO-PERITONEAL/PLEURAL;  Surgeon: Kari Donnice Gaster, MD;  Location: MAIN OR [place];  Service: Neurosurgery    PR LAP, SURG ENTEROLYSIS Right 12/30/2021    Procedure: LAPAROSCOPY, SURGICAL, ENTEROLYSIS (FREEING OF INTESTINAL ADHESION) (SEPARATE PROCEDURE);  Surgeon: Carlin Curtistine Route, MD;  Location: MAIN OR [facility];  Service: Trauma    RADIATION Left

## 2024-03-15 NOTE — Advance Care Planning (Cosign Needed)
 ADVANCE CARE PLANNING NOTE    Discussion Date:  March 15, 2024    Patient has decisional capacity:  Yes    Patient has selected a Health Care Decision-Maker if loses capacity: Yes    Health Care Decision Maker as of 03/15/2024    HCDM (patient stated preference): Christy Buchanan - Daughter - [phone]    Discussion Participants:  Patient, Christy Buchanan  Daughter, Christy Buchanan  Beryl Pride, MD  Bobbette Ross, MD    Communication of Medical Status/Prognosis:     Patient with progressive hypoxia overnight, worsened this morning.  See significant event note.  Discussed progression with patient and daughter at bedside in light of ongoing goals of care conversations and consideration of de-escalation of life-prolonging interventions, ongoing refusal of certain interventions.  Discussed potential need for escalation of care to MICU given oxygen requirements and broached CODE STATUS.  Patient acknowledged that she would be unlikely to make a meaningful recovery in the event of intubation or cardiopulmonary resuscitation.  Elected to change CODE STATUS to DNR, DNI.  Discussed the possibility of escalation of care versus shifting focus to maximizing patient comfort rather than prolonging life.  Patient mentioned that she had been struggling for a long time with breast cancer at age 89, ET, COPD, and newer NSCLC and that she was ready to switch to comfort focused measures.  Daughter Christy Buchanan at bedside is in support of this transition.    Treatment Decisions:    -Transition of care to comfort care   - Pursuing hospice options with palliative care      I spent 45 minutes providing voluntary advance care planning services for this patient.    Jarl Sellitto C. Pride, MD  PGY-2, Internal Medicine

## 2024-03-15 NOTE — Plan of Care (Addendum)
 Pt A&O x4 and vitals were closely monitored on this shift. Pt's spo2 maintained 83-85% with poor recovery on 6L [place] and rapid response was called for increased oxygen needs. Report called to 5-CTCCU RN. All pt belongings sent with pt.

## 2024-03-16 MED ADMIN — QUEtiapine (SEROQUEL) tablet 12.5 mg: 12.5 mg | ORAL | @ 19:00:00

## 2024-03-16 MED ADMIN — mirtazapine (REMERON) tablet 30 mg: 30 mg | ORAL | @ 03:00:00

## 2024-03-16 MED ADMIN — traZODone (DESYREL) tablet 150 mg: 150 mg | ORAL | @ 03:00:00

## 2024-03-16 MED ADMIN — LORazepam (ATIVAN) injection 1 mg: 1 mg | INTRAVENOUS | @ 23:00:00

## 2024-03-16 MED ADMIN — acetaminophen (TYLENOL) tablet 650 mg: 650 mg | ORAL | @ 14:00:00

## 2024-03-17 MED ADMIN — mirtazapine (REMERON) tablet 30 mg: 30 mg | ORAL | @ 01:00:00

## 2024-03-17 MED ADMIN — traZODone (DESYREL) tablet 150 mg: 150 mg | ORAL | @ 01:00:00

## 2024-03-17 NOTE — Progress Notes (Cosign Needed)
 Oncology (MEDO) Progress Note    Assessment & Plan:   Christy Buchanan is a 69 y.o. female who is presenting to [facility] with Acute hypoxic respiratory failure    (CMS-HCC), in the setting of the following pertinent/contributing co-morbidities: see below.    Principal Problem:    Acute hypoxic respiratory failure    (CMS-HCC)  Active Problems:    Gastroesophageal reflux disease    History of breast cancer    Mixed hyperlipidemia    Postmenopausal osteoporosis    Pure hypercholesterolemia    Essential thrombocythemia    (CMS-HCC)    Acquired hypothyroidism    JAK-2 gene mutation    MPN (myeloproliferative neoplasm)    (CMS-HCC)    NPH (normal pressure hydrocephalus) (CMS-HCC)    Ventriculo-peritoneal shunt status    MCI (mild cognitive impairment) with memory loss    Severe episode of recurrent major depressive disorder, without psychotic features    (CMS-HCC)    Bipolar II disorder    (CMS-HCC)    Wrist laceration, left, sequela    Anxiety    Chronic obstructive pulmonary disease with acute exacerbation    (CMS-HCC)    Pneumonia    NSCLC of right lung (CMS-HCC)    Insomnia    History of suicide attempt    Deep vein thrombosis (DVT) of lower extremity (CMS-HCC)    Active Problems  Comfort focused care  Acute hypoxic respiratory failure, concern for PE  RRT called for acute desaturation with rapidly escalating oxygen requirements, progressing from non-rebreather to HFNC, concerning for worsening respiratory failure and possible pulmonary embolism. CTA chest was recommended but declined by the patient. In the setting of ongoing clinical decline and previously expressed limits on life-prolonging interventions, a goals-of-care discussion on 2/4 with patient and her daughter resulted in transition to comfort-focused care. Currently pending placement at hospice facility.  - Expressed desire for hospice care  - Case management assisting in placement in hospice facility   - Palliative care consulted, appreciate recs  - PO Morphine 5 mg PRN for moderate to severe pain and dyspnea  - IV Morphine 0.5 - 1mg  q1h PRN for pain, dyspnea if unable to tolerate PO  - IV Hydromorphone 0.5 mg q2h PRN for pain and second line for dyspnea  - IV Ativan  1 mg q2h PRN for anxiety  - Consider IV Haldol 1mg  q4h PRN for hyperactive delirium     Major depressive disorder with suicidal ideation - History of suicide attempts - Possible Bipolar II disorder and PTSD - Anxiety - Possible major neurocognitive disorder - Insomnia  Patient first admitted to psychiatry for worsening depression with recent suicide attempt (cut wrists), subacute cognitive decline, and inability to care for herself. MOCA 23/30. Extensive psychiatric history, see psych notes.  - Psychiatry consulted, appreciate recommendations. Continuing psych meds:  - Lurasidone  40 mg daily  - Mirtazapine  30 mg qHS  - Trazodone  150 mg qHS  - Quetiapine  12.5 mg BID PRN anxiety or sleep  - Melatonin 3 mg qHS PRN sleep  - IVC order discontinued 2/4 with transition to comfort care  - Continuing 1:1 sitter for recent SI/attempt, though no active SI currently      GLIM Severe Malnutrition (03/13/24 1239)  Low Body Mass Index: <18.5 if < 70 years  Reduced Muscle Mass: Severe deficit  Reduced Food Intake: Any reduction during > 2 weeks  Inflammation: Chronic disease related     Issues Impacting Complexity of Management:  -The patient is at high risk of  complications from metastasis, COPD exacerbation    Medical Decision Making: Discussed the patient's management and/or test interpretation with Psychiatry and Radiation oncology, palliative care as summarized within this note    Chronic Problems  Self-inflicted left wrist wound, improving  Sequelae of previous suicide attempt.  - WOCN consulted, appreciate recommendations   - Left wrist wound healing and may remain open to air    Daily Checklist:  Diet: Regular Diet  DVT PPx: Contraindicated - High Risk for Bleeding/Active Bleeding (chronic subdural hematomas), consider restarting  Electrolytes: No Repletion Needed  Code Status: Comfort Measures - DNR Comfort Care  Dispo: Pending approval for hospice facility    Team Contact Information:   Primary Team: Oncology (MEDO)  Primary Resident: Christy Sells, MD  Resident's Pager: [phone] (Oncology Intern - Tower)    Interval History:   Seen at bedside with daughter. Feels nervous and requests daughter be a part of conversations. Update patient that she is pending evaluation by the hospice facility for acceptance.    Objective:   Temp:  [36.6 ??C (97.9 ??F)-36.9 ??C (98.4 ??F)] 36.6 ??C (97.9 ??F)  Pulse:  [116] 116  SpO2 Pulse:  [94-117] 117  Resp:  [24] 24  BP: (129)/(60) 129/60  SpO2:  [90 %-93 %] 90 %    Gen: cachectic, chronically ill appearing  HENT: Atraumatic, normocephalic, 2L [place]  Neck: Trachea midline  Heart: tachycardic  Lungs: increase work of breathing, no respiratory distress  Abdomen: Soft, NTND  Extremities: Bilateral lower extremity +1 edema  Neuro: Conversant. Anxious affect. Moving extremities spontaneously while lying in bed.    Christy Selner, MD  Internal Medicine PGY1
